# Patient Record
Sex: Male | Born: 1955
Health system: Southern US, Community
[De-identification: ages and names within clinical notes are randomized; demographics above are authoritative.]

## PROBLEM LIST (undated history)

## (undated) DIAGNOSIS — H409 Unspecified glaucoma: Secondary | ICD-10-CM

## (undated) DIAGNOSIS — I639 Cerebral infarction, unspecified: Secondary | ICD-10-CM

## (undated) DIAGNOSIS — J449 Chronic obstructive pulmonary disease, unspecified: Secondary | ICD-10-CM

## (undated) DIAGNOSIS — E7800 Pure hypercholesterolemia, unspecified: Secondary | ICD-10-CM

## (undated) DIAGNOSIS — Z7902 Long term (current) use of antithrombotics/antiplatelets: Secondary | ICD-10-CM

## (undated) DIAGNOSIS — K219 Gastro-esophageal reflux disease without esophagitis: Secondary | ICD-10-CM

## (undated) DIAGNOSIS — K760 Fatty (change of) liver, not elsewhere classified: Secondary | ICD-10-CM

## (undated) DIAGNOSIS — M5030 Other cervical disc degeneration, unspecified cervical region: Secondary | ICD-10-CM

## (undated) DIAGNOSIS — I700 Atherosclerosis of aorta: Secondary | ICD-10-CM

## (undated) DIAGNOSIS — Z9841 Cataract extraction status, right eye: Secondary | ICD-10-CM

## (undated) DIAGNOSIS — R42 Dizziness and giddiness: Secondary | ICD-10-CM

## (undated) DIAGNOSIS — I10 Essential (primary) hypertension: Secondary | ICD-10-CM

## (undated) DIAGNOSIS — I200 Unstable angina: Secondary | ICD-10-CM

## (undated) DIAGNOSIS — I739 Peripheral vascular disease, unspecified: Secondary | ICD-10-CM

## (undated) DIAGNOSIS — Z955 Presence of coronary angioplasty implant and graft: Secondary | ICD-10-CM

## (undated) DIAGNOSIS — I2510 Atherosclerotic heart disease of native coronary artery without angina pectoris: Secondary | ICD-10-CM

## (undated) DIAGNOSIS — M79609 Pain in unspecified limb: Secondary | ICD-10-CM

## (undated) DIAGNOSIS — K4090 Unilateral inguinal hernia, without obstruction or gangrene, not specified as recurrent: Secondary | ICD-10-CM

## (undated) DIAGNOSIS — M109 Gout, unspecified: Secondary | ICD-10-CM

## (undated) DIAGNOSIS — S82892A Other fracture of left lower leg, initial encounter for closed fracture: Secondary | ICD-10-CM

## (undated) HISTORY — DX: Other fracture of left lower leg, initial encounter for closed fracture: S82.892A

## (undated) HISTORY — DX: Dizziness and giddiness: R42

## (undated) HISTORY — DX: Pain in unspecified limb: M79.609

## (undated) HISTORY — DX: Unstable angina: I20.0

## (undated) HISTORY — DX: Unspecified glaucoma: H40.9

## (undated) HISTORY — PX: EYE SURGERY: SHX253

## (undated) HISTORY — PX: APPENDECTOMY: SHX54

## (undated) HISTORY — PX: CORONARY ANGIOPLASTY WITH STENT PLACEMENT: SHX49

## (undated) HISTORY — PX: CARDIAC CATHETERIZATION: SHX172

---

## 2008-07-19 ENCOUNTER — Inpatient Hospital Stay: Payer: Self-pay | Admitting: Surgery

## 2009-05-13 ENCOUNTER — Emergency Department: Payer: Self-pay | Admitting: Internal Medicine

## 2010-01-21 ENCOUNTER — Observation Stay: Payer: Self-pay | Admitting: Internal Medicine

## 2010-06-18 ENCOUNTER — Inpatient Hospital Stay: Payer: Self-pay | Admitting: Internal Medicine

## 2010-06-19 DIAGNOSIS — I2510 Atherosclerotic heart disease of native coronary artery without angina pectoris: Secondary | ICD-10-CM

## 2010-06-19 HISTORY — PX: LEFT HEART CATH AND CORONARY ANGIOGRAPHY: CATH118249

## 2010-06-19 HISTORY — DX: Atherosclerotic heart disease of native coronary artery without angina pectoris: I25.10

## 2010-07-09 HISTORY — PX: OTHER SURGICAL HISTORY: SHX169

## 2011-12-03 ENCOUNTER — Emergency Department: Payer: Self-pay | Admitting: *Deleted

## 2011-12-03 LAB — COMPREHENSIVE METABOLIC PANEL
Alkaline Phosphatase: 90 U/L (ref 50–136)
Anion Gap: 12 (ref 7–16)
Co2: 23 mmol/L (ref 21–32)
Creatinine: 0.79 mg/dL (ref 0.60–1.30)
EGFR (African American): >60
Glucose: 99 mg/dL (ref 65–99)
Osmolality: 274 (ref 275–301)
Potassium: 3.4 mmol/L — ABNORMAL LOW (ref 3.5–5.1)
SGPT (ALT): 22 U/L
Total Protein: 7.9 g/dL (ref 6.4–8.2)

## 2011-12-03 LAB — CBC
HCT: 50 % (ref 40.0–52.0)
HGB: 17.1 g/dL (ref 13.0–18.0)
MCV: 98 fL (ref 80–100)
RDW: 14.4 % (ref 11.5–14.5)
WBC: 7.2 10*3/uL (ref 3.8–10.6)

## 2011-12-03 LAB — TROPONIN I: Troponin-I: <0.02 ng/mL

## 2012-11-06 ENCOUNTER — Observation Stay: Payer: Self-pay | Admitting: Internal Medicine

## 2012-11-06 DIAGNOSIS — J019 Acute sinusitis, unspecified: Secondary | ICD-10-CM | POA: Diagnosis not present

## 2012-11-06 DIAGNOSIS — I10 Essential (primary) hypertension: Secondary | ICD-10-CM | POA: Diagnosis not present

## 2012-11-06 DIAGNOSIS — Z79899 Other long term (current) drug therapy: Secondary | ICD-10-CM | POA: Diagnosis not present

## 2012-11-06 DIAGNOSIS — E785 Hyperlipidemia, unspecified: Secondary | ICD-10-CM | POA: Diagnosis not present

## 2012-11-06 DIAGNOSIS — R0789 Other chest pain: Secondary | ICD-10-CM | POA: Diagnosis not present

## 2012-11-06 DIAGNOSIS — Z7982 Long term (current) use of aspirin: Secondary | ICD-10-CM | POA: Diagnosis not present

## 2012-11-06 DIAGNOSIS — Z7902 Long term (current) use of antithrombotics/antiplatelets: Secondary | ICD-10-CM | POA: Diagnosis not present

## 2012-11-06 DIAGNOSIS — R079 Chest pain, unspecified: Secondary | ICD-10-CM | POA: Diagnosis not present

## 2012-11-06 DIAGNOSIS — E7800 Pure hypercholesterolemia, unspecified: Secondary | ICD-10-CM | POA: Diagnosis not present

## 2012-11-06 DIAGNOSIS — R42 Dizziness and giddiness: Secondary | ICD-10-CM | POA: Diagnosis not present

## 2012-11-06 DIAGNOSIS — Z602 Problems related to living alone: Secondary | ICD-10-CM | POA: Diagnosis not present

## 2012-11-06 DIAGNOSIS — F17200 Nicotine dependence, unspecified, uncomplicated: Secondary | ICD-10-CM | POA: Diagnosis not present

## 2012-11-06 DIAGNOSIS — Z9089 Acquired absence of other organs: Secondary | ICD-10-CM | POA: Diagnosis not present

## 2012-11-06 DIAGNOSIS — Z9861 Coronary angioplasty status: Secondary | ICD-10-CM | POA: Diagnosis not present

## 2012-11-06 DIAGNOSIS — I2510 Atherosclerotic heart disease of native coronary artery without angina pectoris: Secondary | ICD-10-CM | POA: Diagnosis not present

## 2012-11-06 LAB — BASIC METABOLIC PANEL
Anion Gap: 7 (ref 7–16)
Co2: 25 mmol/L (ref 21–32)
Creatinine: 0.93 mg/dL (ref 0.60–1.30)
EGFR (African American): >60
EGFR (Non-African Amer.): >60
Glucose: 88 mg/dL (ref 65–99)
Potassium: 3.7 mmol/L (ref 3.5–5.1)
Sodium: 134 mmol/L — ABNORMAL LOW (ref 136–145)

## 2012-11-06 LAB — CBC
MCH: 32.9 pg (ref 26.0–34.0)
MCHC: 34.3 g/dL (ref 32.0–36.0)
MCV: 96 fL (ref 80–100)
Platelet: 213 10*3/uL (ref 150–440)
RDW: 13.6 % (ref 11.5–14.5)
WBC: 7.7 10*3/uL (ref 3.8–10.6)

## 2012-11-06 LAB — URINALYSIS, COMPLETE
Bilirubin,UR: NEGATIVE
Glucose,UR: NEGATIVE mg/dL (ref 0–75)
Leukocyte Esterase: NEGATIVE
Nitrite: NEGATIVE
Ph: 5 (ref 4.5–8.0)
Protein: NEGATIVE
RBC,UR: 1 /HPF (ref 0–5)

## 2012-11-06 LAB — ETHANOL
Ethanol %: 0.074 % (ref 0.000–0.080)
Ethanol: 74 mg/dL

## 2012-11-06 LAB — CK TOTAL AND CKMB (NOT AT ARMC)
CK, Total: 63 U/L (ref 35–232)
CK, Total: 62 U/L (ref 35–232)
CK-MB: <0.5 ng/mL — ABNORMAL LOW (ref 0.5–3.6)

## 2012-11-06 LAB — PROTIME-INR: INR: 1

## 2012-11-06 LAB — TROPONIN I: Troponin-I: <0.02 ng/mL

## 2012-11-07 DIAGNOSIS — I2510 Atherosclerotic heart disease of native coronary artery without angina pectoris: Secondary | ICD-10-CM | POA: Diagnosis not present

## 2012-11-07 DIAGNOSIS — E785 Hyperlipidemia, unspecified: Secondary | ICD-10-CM | POA: Diagnosis not present

## 2012-11-07 DIAGNOSIS — R079 Chest pain, unspecified: Secondary | ICD-10-CM | POA: Diagnosis not present

## 2012-11-07 DIAGNOSIS — F17200 Nicotine dependence, unspecified, uncomplicated: Secondary | ICD-10-CM | POA: Diagnosis not present

## 2012-11-07 DIAGNOSIS — I10 Essential (primary) hypertension: Secondary | ICD-10-CM | POA: Diagnosis not present

## 2012-11-07 LAB — BASIC METABOLIC PANEL
Anion Gap: 4 — ABNORMAL LOW (ref 7–16)
BUN: 14 mg/dL (ref 7–18)
Chloride: 107 mmol/L (ref 98–107)
Creatinine: 0.83 mg/dL (ref 0.60–1.30)
EGFR (African American): >60
Glucose: 90 mg/dL (ref 65–99)
Potassium: 4.1 mmol/L (ref 3.5–5.1)
Sodium: 136 mmol/L (ref 136–145)

## 2012-11-07 LAB — CBC WITH DIFFERENTIAL/PLATELET
HCT: 47.7 % (ref 40.0–52.0)
Lymphocyte %: 25.3 %
MCH: 32.9 pg (ref 26.0–34.0)
MCV: 95 fL (ref 80–100)
Monocyte %: 8.9 %
Neutrophil %: 63 %
RBC: 5.02 10*6/uL (ref 4.40–5.90)
RDW: 13.8 % (ref 11.5–14.5)
WBC: 6.9 10*3/uL (ref 3.8–10.6)

## 2012-11-07 LAB — LIPID PANEL
Cholesterol: 178 mg/dL (ref 0–200)
Ldl Cholesterol, Calc: 136 mg/dL — ABNORMAL HIGH (ref 0–100)
Triglycerides: 103 mg/dL (ref 0–200)

## 2012-11-07 LAB — CK TOTAL AND CKMB (NOT AT ARMC): CK, Total: 55 U/L (ref 35–232)

## 2012-11-14 DIAGNOSIS — I10 Essential (primary) hypertension: Secondary | ICD-10-CM | POA: Diagnosis not present

## 2012-11-14 DIAGNOSIS — E785 Hyperlipidemia, unspecified: Secondary | ICD-10-CM | POA: Diagnosis not present

## 2012-11-14 DIAGNOSIS — I2510 Atherosclerotic heart disease of native coronary artery without angina pectoris: Secondary | ICD-10-CM | POA: Diagnosis not present

## 2012-11-14 DIAGNOSIS — Z23 Encounter for immunization: Secondary | ICD-10-CM | POA: Diagnosis not present

## 2012-11-14 DIAGNOSIS — L259 Unspecified contact dermatitis, unspecified cause: Secondary | ICD-10-CM | POA: Diagnosis not present

## 2012-11-21 DIAGNOSIS — I2510 Atherosclerotic heart disease of native coronary artery without angina pectoris: Secondary | ICD-10-CM | POA: Diagnosis not present

## 2012-11-21 DIAGNOSIS — R0789 Other chest pain: Secondary | ICD-10-CM | POA: Diagnosis not present

## 2012-11-21 DIAGNOSIS — R0989 Other specified symptoms and signs involving the circulatory and respiratory systems: Secondary | ICD-10-CM | POA: Diagnosis not present

## 2012-11-21 DIAGNOSIS — E782 Mixed hyperlipidemia: Secondary | ICD-10-CM | POA: Diagnosis not present

## 2012-12-17 DIAGNOSIS — Z0000 Encounter for general adult medical examination without abnormal findings: Secondary | ICD-10-CM | POA: Diagnosis not present

## 2012-12-17 DIAGNOSIS — I10 Essential (primary) hypertension: Secondary | ICD-10-CM | POA: Diagnosis not present

## 2012-12-17 DIAGNOSIS — Z125 Encounter for screening for malignant neoplasm of prostate: Secondary | ICD-10-CM | POA: Diagnosis not present

## 2012-12-17 DIAGNOSIS — M109 Gout, unspecified: Secondary | ICD-10-CM | POA: Diagnosis not present

## 2012-12-17 DIAGNOSIS — Z79899 Other long term (current) drug therapy: Secondary | ICD-10-CM | POA: Diagnosis not present

## 2012-12-17 DIAGNOSIS — E785 Hyperlipidemia, unspecified: Secondary | ICD-10-CM | POA: Diagnosis not present

## 2012-12-18 DIAGNOSIS — L708 Other acne: Secondary | ICD-10-CM | POA: Diagnosis not present

## 2012-12-18 DIAGNOSIS — L570 Actinic keratosis: Secondary | ICD-10-CM | POA: Diagnosis not present

## 2012-12-18 DIAGNOSIS — R21 Rash and other nonspecific skin eruption: Secondary | ICD-10-CM | POA: Diagnosis not present

## 2012-12-18 DIAGNOSIS — B079 Viral wart, unspecified: Secondary | ICD-10-CM | POA: Diagnosis not present

## 2012-12-18 DIAGNOSIS — L821 Other seborrheic keratosis: Secondary | ICD-10-CM | POA: Diagnosis not present

## 2013-01-08 DIAGNOSIS — L259 Unspecified contact dermatitis, unspecified cause: Secondary | ICD-10-CM | POA: Diagnosis not present

## 2013-01-08 DIAGNOSIS — L570 Actinic keratosis: Secondary | ICD-10-CM | POA: Diagnosis not present

## 2013-01-08 DIAGNOSIS — R21 Rash and other nonspecific skin eruption: Secondary | ICD-10-CM | POA: Diagnosis not present

## 2013-01-21 DIAGNOSIS — Z800 Family history of malignant neoplasm of digestive organs: Secondary | ICD-10-CM | POA: Diagnosis not present

## 2013-01-21 DIAGNOSIS — Z1211 Encounter for screening for malignant neoplasm of colon: Secondary | ICD-10-CM | POA: Diagnosis not present

## 2013-01-21 DIAGNOSIS — I2510 Atherosclerotic heart disease of native coronary artery without angina pectoris: Secondary | ICD-10-CM | POA: Diagnosis not present

## 2013-01-30 DIAGNOSIS — L259 Unspecified contact dermatitis, unspecified cause: Secondary | ICD-10-CM | POA: Diagnosis not present

## 2013-02-25 DIAGNOSIS — E782 Mixed hyperlipidemia: Secondary | ICD-10-CM | POA: Diagnosis not present

## 2013-02-25 DIAGNOSIS — I10 Essential (primary) hypertension: Secondary | ICD-10-CM | POA: Diagnosis not present

## 2013-02-25 DIAGNOSIS — I2510 Atherosclerotic heart disease of native coronary artery without angina pectoris: Secondary | ICD-10-CM | POA: Diagnosis not present

## 2013-03-09 LAB — HM COLONOSCOPY

## 2013-03-30 ENCOUNTER — Ambulatory Visit: Payer: Self-pay | Admitting: Gastroenterology

## 2013-03-30 DIAGNOSIS — I2510 Atherosclerotic heart disease of native coronary artery without angina pectoris: Secondary | ICD-10-CM | POA: Diagnosis not present

## 2013-03-30 DIAGNOSIS — K5730 Diverticulosis of large intestine without perforation or abscess without bleeding: Secondary | ICD-10-CM | POA: Diagnosis not present

## 2013-03-30 DIAGNOSIS — Z7982 Long term (current) use of aspirin: Secondary | ICD-10-CM | POA: Diagnosis not present

## 2013-03-30 DIAGNOSIS — D128 Benign neoplasm of rectum: Secondary | ICD-10-CM | POA: Diagnosis not present

## 2013-03-30 DIAGNOSIS — Z79899 Other long term (current) drug therapy: Secondary | ICD-10-CM | POA: Diagnosis not present

## 2013-03-30 DIAGNOSIS — D649 Anemia, unspecified: Secondary | ICD-10-CM | POA: Diagnosis not present

## 2013-03-30 DIAGNOSIS — D126 Benign neoplasm of colon, unspecified: Secondary | ICD-10-CM | POA: Diagnosis not present

## 2013-03-30 DIAGNOSIS — F17200 Nicotine dependence, unspecified, uncomplicated: Secondary | ICD-10-CM | POA: Diagnosis not present

## 2013-03-30 DIAGNOSIS — Z1211 Encounter for screening for malignant neoplasm of colon: Secondary | ICD-10-CM | POA: Diagnosis not present

## 2013-03-30 DIAGNOSIS — Z9861 Coronary angioplasty status: Secondary | ICD-10-CM | POA: Diagnosis not present

## 2013-03-30 DIAGNOSIS — Z800 Family history of malignant neoplasm of digestive organs: Secondary | ICD-10-CM | POA: Diagnosis not present

## 2013-03-30 HISTORY — PX: COLONOSCOPY: SHX5424

## 2013-04-20 DIAGNOSIS — M62838 Other muscle spasm: Secondary | ICD-10-CM | POA: Diagnosis not present

## 2013-04-20 DIAGNOSIS — L259 Unspecified contact dermatitis, unspecified cause: Secondary | ICD-10-CM | POA: Diagnosis not present

## 2013-04-20 DIAGNOSIS — Z23 Encounter for immunization: Secondary | ICD-10-CM | POA: Diagnosis not present

## 2013-07-23 DIAGNOSIS — J019 Acute sinusitis, unspecified: Secondary | ICD-10-CM | POA: Diagnosis not present

## 2013-08-11 DIAGNOSIS — R0609 Other forms of dyspnea: Secondary | ICD-10-CM | POA: Diagnosis not present

## 2013-08-11 DIAGNOSIS — R0789 Other chest pain: Secondary | ICD-10-CM | POA: Diagnosis not present

## 2013-08-11 DIAGNOSIS — I10 Essential (primary) hypertension: Secondary | ICD-10-CM | POA: Diagnosis not present

## 2013-08-11 DIAGNOSIS — R0989 Other specified symptoms and signs involving the circulatory and respiratory systems: Secondary | ICD-10-CM | POA: Diagnosis not present

## 2013-08-11 DIAGNOSIS — I2510 Atherosclerotic heart disease of native coronary artery without angina pectoris: Secondary | ICD-10-CM | POA: Diagnosis not present

## 2013-08-21 ENCOUNTER — Ambulatory Visit: Payer: Self-pay | Admitting: Internal Medicine

## 2013-08-21 DIAGNOSIS — R05 Cough: Secondary | ICD-10-CM | POA: Diagnosis not present

## 2013-08-21 DIAGNOSIS — F17200 Nicotine dependence, unspecified, uncomplicated: Secondary | ICD-10-CM | POA: Diagnosis not present

## 2013-08-21 DIAGNOSIS — R059 Cough, unspecified: Secondary | ICD-10-CM | POA: Diagnosis not present

## 2013-08-21 DIAGNOSIS — J40 Bronchitis, not specified as acute or chronic: Secondary | ICD-10-CM | POA: Diagnosis not present

## 2013-09-04 DIAGNOSIS — R0609 Other forms of dyspnea: Secondary | ICD-10-CM | POA: Diagnosis not present

## 2013-09-04 DIAGNOSIS — I70219 Atherosclerosis of native arteries of extremities with intermittent claudication, unspecified extremity: Secondary | ICD-10-CM | POA: Diagnosis not present

## 2013-09-04 DIAGNOSIS — I2510 Atherosclerotic heart disease of native coronary artery without angina pectoris: Secondary | ICD-10-CM | POA: Diagnosis not present

## 2013-09-04 DIAGNOSIS — R0789 Other chest pain: Secondary | ICD-10-CM | POA: Diagnosis not present

## 2013-09-13 ENCOUNTER — Ambulatory Visit: Payer: Self-pay | Admitting: Physician Assistant

## 2013-09-13 DIAGNOSIS — S058X9A Other injuries of unspecified eye and orbit, initial encounter: Secondary | ICD-10-CM | POA: Diagnosis not present

## 2013-09-13 DIAGNOSIS — T1500XA Foreign body in cornea, unspecified eye, initial encounter: Secondary | ICD-10-CM | POA: Diagnosis not present

## 2013-10-27 DIAGNOSIS — I70219 Atherosclerosis of native arteries of extremities with intermittent claudication, unspecified extremity: Secondary | ICD-10-CM | POA: Diagnosis not present

## 2013-10-27 DIAGNOSIS — F17200 Nicotine dependence, unspecified, uncomplicated: Secondary | ICD-10-CM | POA: Diagnosis not present

## 2013-10-27 DIAGNOSIS — I2510 Atherosclerotic heart disease of native coronary artery without angina pectoris: Secondary | ICD-10-CM | POA: Diagnosis not present

## 2013-10-27 DIAGNOSIS — I10 Essential (primary) hypertension: Secondary | ICD-10-CM | POA: Diagnosis not present

## 2013-11-09 DIAGNOSIS — R0989 Other specified symptoms and signs involving the circulatory and respiratory systems: Secondary | ICD-10-CM | POA: Diagnosis not present

## 2013-11-09 DIAGNOSIS — I10 Essential (primary) hypertension: Secondary | ICD-10-CM | POA: Diagnosis not present

## 2013-11-09 DIAGNOSIS — E782 Mixed hyperlipidemia: Secondary | ICD-10-CM | POA: Diagnosis not present

## 2013-11-09 DIAGNOSIS — R0609 Other forms of dyspnea: Secondary | ICD-10-CM | POA: Diagnosis not present

## 2013-11-09 DIAGNOSIS — I2510 Atherosclerotic heart disease of native coronary artery without angina pectoris: Secondary | ICD-10-CM | POA: Diagnosis not present

## 2013-12-18 DIAGNOSIS — I2510 Atherosclerotic heart disease of native coronary artery without angina pectoris: Secondary | ICD-10-CM | POA: Diagnosis not present

## 2013-12-18 DIAGNOSIS — Z0000 Encounter for general adult medical examination without abnormal findings: Secondary | ICD-10-CM | POA: Diagnosis not present

## 2013-12-18 DIAGNOSIS — I10 Essential (primary) hypertension: Secondary | ICD-10-CM | POA: Diagnosis not present

## 2013-12-18 DIAGNOSIS — Z125 Encounter for screening for malignant neoplasm of prostate: Secondary | ICD-10-CM | POA: Diagnosis not present

## 2013-12-18 DIAGNOSIS — F17200 Nicotine dependence, unspecified, uncomplicated: Secondary | ICD-10-CM | POA: Diagnosis not present

## 2013-12-18 DIAGNOSIS — I70219 Atherosclerosis of native arteries of extremities with intermittent claudication, unspecified extremity: Secondary | ICD-10-CM | POA: Diagnosis not present

## 2013-12-18 DIAGNOSIS — M109 Gout, unspecified: Secondary | ICD-10-CM | POA: Diagnosis not present

## 2013-12-18 DIAGNOSIS — Z23 Encounter for immunization: Secondary | ICD-10-CM | POA: Diagnosis not present

## 2013-12-18 DIAGNOSIS — E785 Hyperlipidemia, unspecified: Secondary | ICD-10-CM | POA: Diagnosis not present

## 2014-01-27 DIAGNOSIS — Z9889 Other specified postprocedural states: Secondary | ICD-10-CM | POA: Insufficient documentation

## 2014-01-27 DIAGNOSIS — I739 Peripheral vascular disease, unspecified: Secondary | ICD-10-CM | POA: Insufficient documentation

## 2014-04-08 DIAGNOSIS — Z23 Encounter for immunization: Secondary | ICD-10-CM | POA: Diagnosis not present

## 2014-04-13 DIAGNOSIS — M109 Gout, unspecified: Secondary | ICD-10-CM | POA: Diagnosis not present

## 2014-05-12 DIAGNOSIS — E780 Pure hypercholesterolemia: Secondary | ICD-10-CM | POA: Diagnosis not present

## 2014-05-12 DIAGNOSIS — M25511 Pain in right shoulder: Secondary | ICD-10-CM | POA: Diagnosis not present

## 2014-05-12 DIAGNOSIS — Z9889 Other specified postprocedural states: Secondary | ICD-10-CM | POA: Diagnosis not present

## 2014-05-12 DIAGNOSIS — M720 Palmar fascial fibromatosis [Dupuytren]: Secondary | ICD-10-CM | POA: Diagnosis not present

## 2014-05-12 DIAGNOSIS — J40 Bronchitis, not specified as acute or chronic: Secondary | ICD-10-CM | POA: Diagnosis not present

## 2014-05-12 DIAGNOSIS — I10 Essential (primary) hypertension: Secondary | ICD-10-CM | POA: Diagnosis not present

## 2014-05-18 DIAGNOSIS — M25511 Pain in right shoulder: Secondary | ICD-10-CM | POA: Diagnosis not present

## 2014-05-18 DIAGNOSIS — M7551 Bursitis of right shoulder: Secondary | ICD-10-CM | POA: Diagnosis not present

## 2014-07-02 ENCOUNTER — Observation Stay: Payer: Self-pay | Admitting: Internal Medicine

## 2014-07-02 DIAGNOSIS — Z79899 Other long term (current) drug therapy: Secondary | ICD-10-CM | POA: Diagnosis not present

## 2014-07-02 DIAGNOSIS — I252 Old myocardial infarction: Secondary | ICD-10-CM | POA: Diagnosis not present

## 2014-07-02 DIAGNOSIS — M549 Dorsalgia, unspecified: Secondary | ICD-10-CM | POA: Diagnosis not present

## 2014-07-02 DIAGNOSIS — Z955 Presence of coronary angioplasty implant and graft: Secondary | ICD-10-CM | POA: Diagnosis not present

## 2014-07-02 DIAGNOSIS — I200 Unstable angina: Secondary | ICD-10-CM | POA: Diagnosis not present

## 2014-07-02 DIAGNOSIS — I10 Essential (primary) hypertension: Secondary | ICD-10-CM | POA: Diagnosis not present

## 2014-07-02 DIAGNOSIS — E785 Hyperlipidemia, unspecified: Secondary | ICD-10-CM | POA: Diagnosis not present

## 2014-07-02 DIAGNOSIS — M25519 Pain in unspecified shoulder: Secondary | ICD-10-CM | POA: Diagnosis not present

## 2014-07-02 DIAGNOSIS — Z23 Encounter for immunization: Secondary | ICD-10-CM | POA: Diagnosis not present

## 2014-07-02 DIAGNOSIS — M542 Cervicalgia: Secondary | ICD-10-CM | POA: Diagnosis not present

## 2014-07-02 DIAGNOSIS — E782 Mixed hyperlipidemia: Secondary | ICD-10-CM | POA: Diagnosis not present

## 2014-07-02 DIAGNOSIS — Z7901 Long term (current) use of anticoagulants: Secondary | ICD-10-CM | POA: Diagnosis not present

## 2014-07-02 DIAGNOSIS — I25110 Atherosclerotic heart disease of native coronary artery with unstable angina pectoris: Secondary | ICD-10-CM | POA: Diagnosis not present

## 2014-07-02 DIAGNOSIS — I25119 Atherosclerotic heart disease of native coronary artery with unspecified angina pectoris: Secondary | ICD-10-CM | POA: Diagnosis not present

## 2014-07-02 DIAGNOSIS — Z8249 Family history of ischemic heart disease and other diseases of the circulatory system: Secondary | ICD-10-CM | POA: Diagnosis not present

## 2014-07-02 DIAGNOSIS — F17210 Nicotine dependence, cigarettes, uncomplicated: Secondary | ICD-10-CM | POA: Diagnosis not present

## 2014-07-02 DIAGNOSIS — R079 Chest pain, unspecified: Secondary | ICD-10-CM | POA: Diagnosis not present

## 2014-07-02 DIAGNOSIS — Z7982 Long term (current) use of aspirin: Secondary | ICD-10-CM | POA: Diagnosis not present

## 2014-07-02 DIAGNOSIS — J9811 Atelectasis: Secondary | ICD-10-CM | POA: Diagnosis not present

## 2014-07-02 LAB — CBC
HCT: 47.6 % (ref 40.0–52.0)
HGB: 16 g/dL (ref 13.0–18.0)
MCH: 32.9 pg (ref 26.0–34.0)
MCHC: 33.7 g/dL (ref 32.0–36.0)
MCV: 98 fL (ref 80–100)
PLATELETS: 187 10*3/uL (ref 150–440)
RBC: 4.87 10*6/uL (ref 4.40–5.90)
RDW: 13.8 % (ref 11.5–14.5)
WBC: 8.7 10*3/uL (ref 3.8–10.6)

## 2014-07-02 LAB — BASIC METABOLIC PANEL
ANION GAP: 8 (ref 7–16)
BUN: 15 mg/dL (ref 7–18)
CALCIUM: 8.7 mg/dL (ref 8.5–10.1)
Chloride: 103 mmol/L (ref 98–107)
Co2: 26 mmol/L (ref 21–32)
Creatinine: 0.81 mg/dL (ref 0.60–1.30)
EGFR (African American): >60
GLUCOSE: 109 mg/dL — AB (ref 65–99)
OSMOLALITY: 275 (ref 275–301)
Potassium: 4 mmol/L (ref 3.5–5.1)
SODIUM: 137 mmol/L (ref 136–145)

## 2014-07-02 LAB — TROPONIN I
Troponin-I: <0.02 ng/mL
Troponin-I: <0.02 ng/mL

## 2014-07-02 LAB — CK-MB
CK-MB: <0.5 ng/mL — ABNORMAL LOW (ref 0.5–3.6)
CK-MB: <0.5 ng/mL — ABNORMAL LOW (ref 0.5–3.6)
CK-MB: <0.5 ng/mL — ABNORMAL LOW (ref 0.5–3.6)

## 2014-07-02 LAB — D-DIMER(ARMC): D-Dimer: 416 ng/ml

## 2014-07-03 DIAGNOSIS — E785 Hyperlipidemia, unspecified: Secondary | ICD-10-CM | POA: Diagnosis not present

## 2014-07-03 DIAGNOSIS — I200 Unstable angina: Secondary | ICD-10-CM | POA: Diagnosis not present

## 2014-07-03 DIAGNOSIS — I25119 Atherosclerotic heart disease of native coronary artery with unspecified angina pectoris: Secondary | ICD-10-CM | POA: Diagnosis not present

## 2014-07-03 DIAGNOSIS — I10 Essential (primary) hypertension: Secondary | ICD-10-CM | POA: Diagnosis not present

## 2014-07-03 DIAGNOSIS — R079 Chest pain, unspecified: Secondary | ICD-10-CM | POA: Diagnosis not present

## 2014-07-03 DIAGNOSIS — E782 Mixed hyperlipidemia: Secondary | ICD-10-CM | POA: Diagnosis not present

## 2014-07-06 DIAGNOSIS — I25110 Atherosclerotic heart disease of native coronary artery with unstable angina pectoris: Secondary | ICD-10-CM | POA: Diagnosis not present

## 2014-07-06 DIAGNOSIS — E780 Pure hypercholesterolemia: Secondary | ICD-10-CM | POA: Diagnosis not present

## 2014-07-06 DIAGNOSIS — Z9889 Other specified postprocedural states: Secondary | ICD-10-CM | POA: Diagnosis not present

## 2014-07-06 DIAGNOSIS — I10 Essential (primary) hypertension: Secondary | ICD-10-CM | POA: Diagnosis not present

## 2014-07-08 ENCOUNTER — Ambulatory Visit: Payer: Self-pay | Admitting: Cardiology

## 2014-07-08 DIAGNOSIS — Z800 Family history of malignant neoplasm of digestive organs: Secondary | ICD-10-CM | POA: Diagnosis not present

## 2014-07-08 DIAGNOSIS — I2510 Atherosclerotic heart disease of native coronary artery without angina pectoris: Secondary | ICD-10-CM | POA: Diagnosis not present

## 2014-07-08 DIAGNOSIS — E785 Hyperlipidemia, unspecified: Secondary | ICD-10-CM | POA: Diagnosis not present

## 2014-07-08 DIAGNOSIS — Z8639 Personal history of other endocrine, nutritional and metabolic disease: Secondary | ICD-10-CM | POA: Diagnosis not present

## 2014-07-08 DIAGNOSIS — I10 Essential (primary) hypertension: Secondary | ICD-10-CM | POA: Diagnosis not present

## 2014-07-08 DIAGNOSIS — I739 Peripheral vascular disease, unspecified: Secondary | ICD-10-CM | POA: Diagnosis not present

## 2014-07-08 DIAGNOSIS — Z7902 Long term (current) use of antithrombotics/antiplatelets: Secondary | ICD-10-CM | POA: Diagnosis not present

## 2014-07-08 DIAGNOSIS — F17210 Nicotine dependence, cigarettes, uncomplicated: Secondary | ICD-10-CM | POA: Diagnosis not present

## 2014-07-08 DIAGNOSIS — I200 Unstable angina: Secondary | ICD-10-CM | POA: Diagnosis not present

## 2014-07-08 DIAGNOSIS — I25110 Atherosclerotic heart disease of native coronary artery with unstable angina pectoris: Secondary | ICD-10-CM | POA: Diagnosis not present

## 2014-07-08 DIAGNOSIS — Z95818 Presence of other cardiac implants and grafts: Secondary | ICD-10-CM | POA: Diagnosis not present

## 2014-07-08 DIAGNOSIS — F17290 Nicotine dependence, other tobacco product, uncomplicated: Secondary | ICD-10-CM | POA: Diagnosis not present

## 2014-07-08 DIAGNOSIS — Z79899 Other long term (current) drug therapy: Secondary | ICD-10-CM | POA: Diagnosis not present

## 2014-07-08 DIAGNOSIS — Z7982 Long term (current) use of aspirin: Secondary | ICD-10-CM | POA: Diagnosis not present

## 2014-07-08 HISTORY — PX: LEFT HEART CATH AND CORONARY ANGIOGRAPHY: CATH118249

## 2014-09-19 IMAGING — CR DG CHEST 2V
1 series · 3 of 3 positions shown · non-contrast
Comparison: 11/06/2012

CLINICAL DATA: Cough, smoker

EXAM:
CHEST  2 VIEW

[Series 1: pa · 0.17mm/px · 3 of 3 slices shown]
[im 1/3]
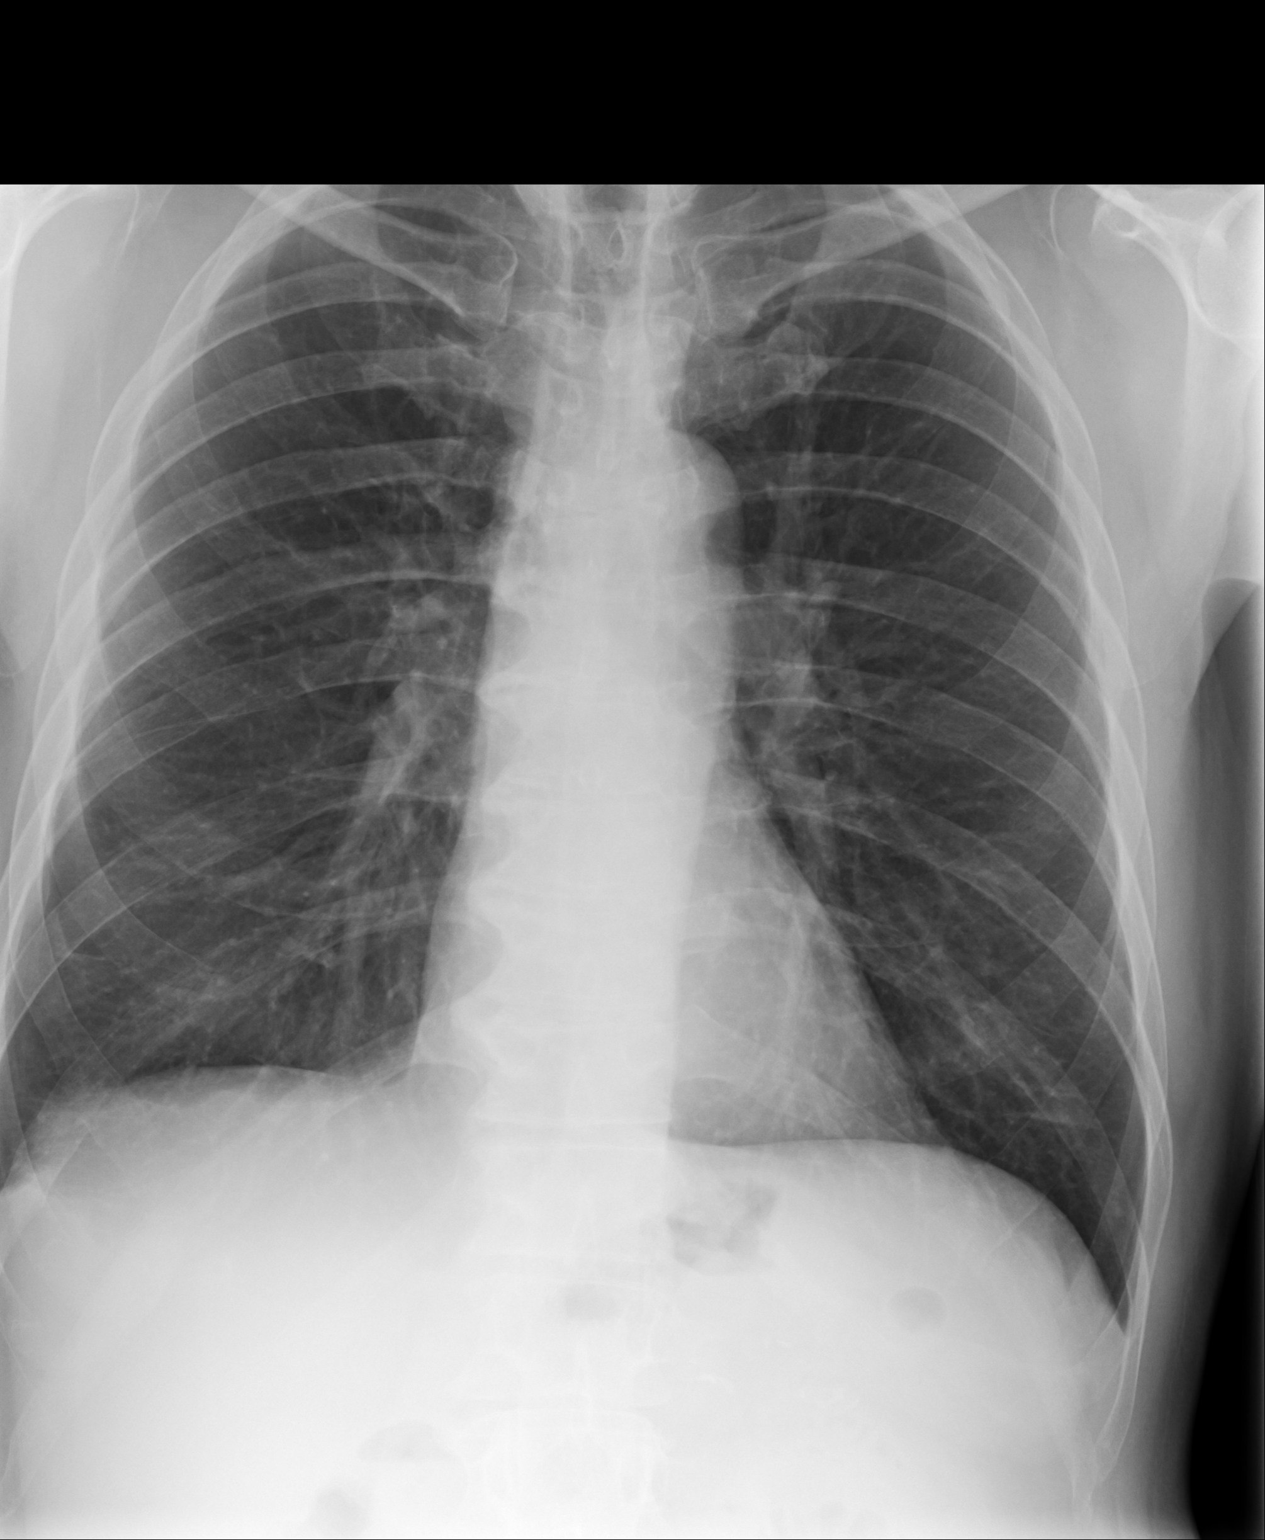
[im 2/3]
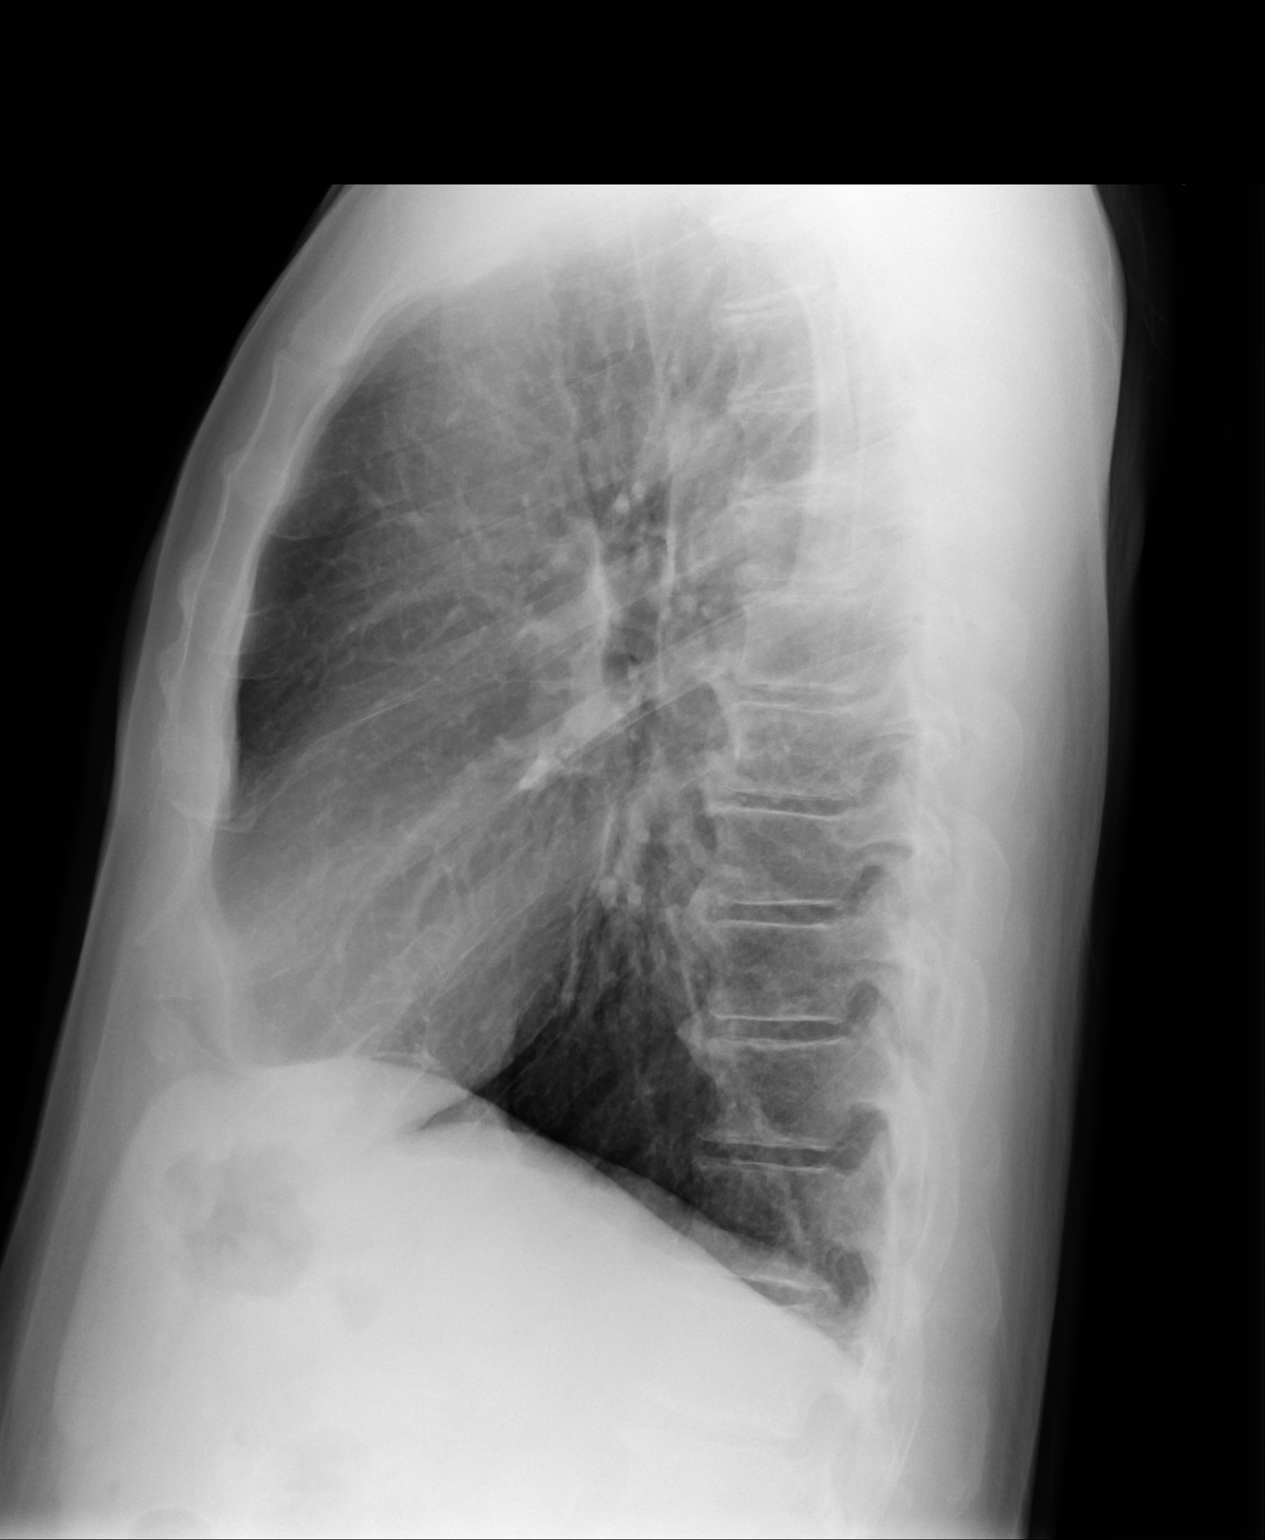
[im 3/3]
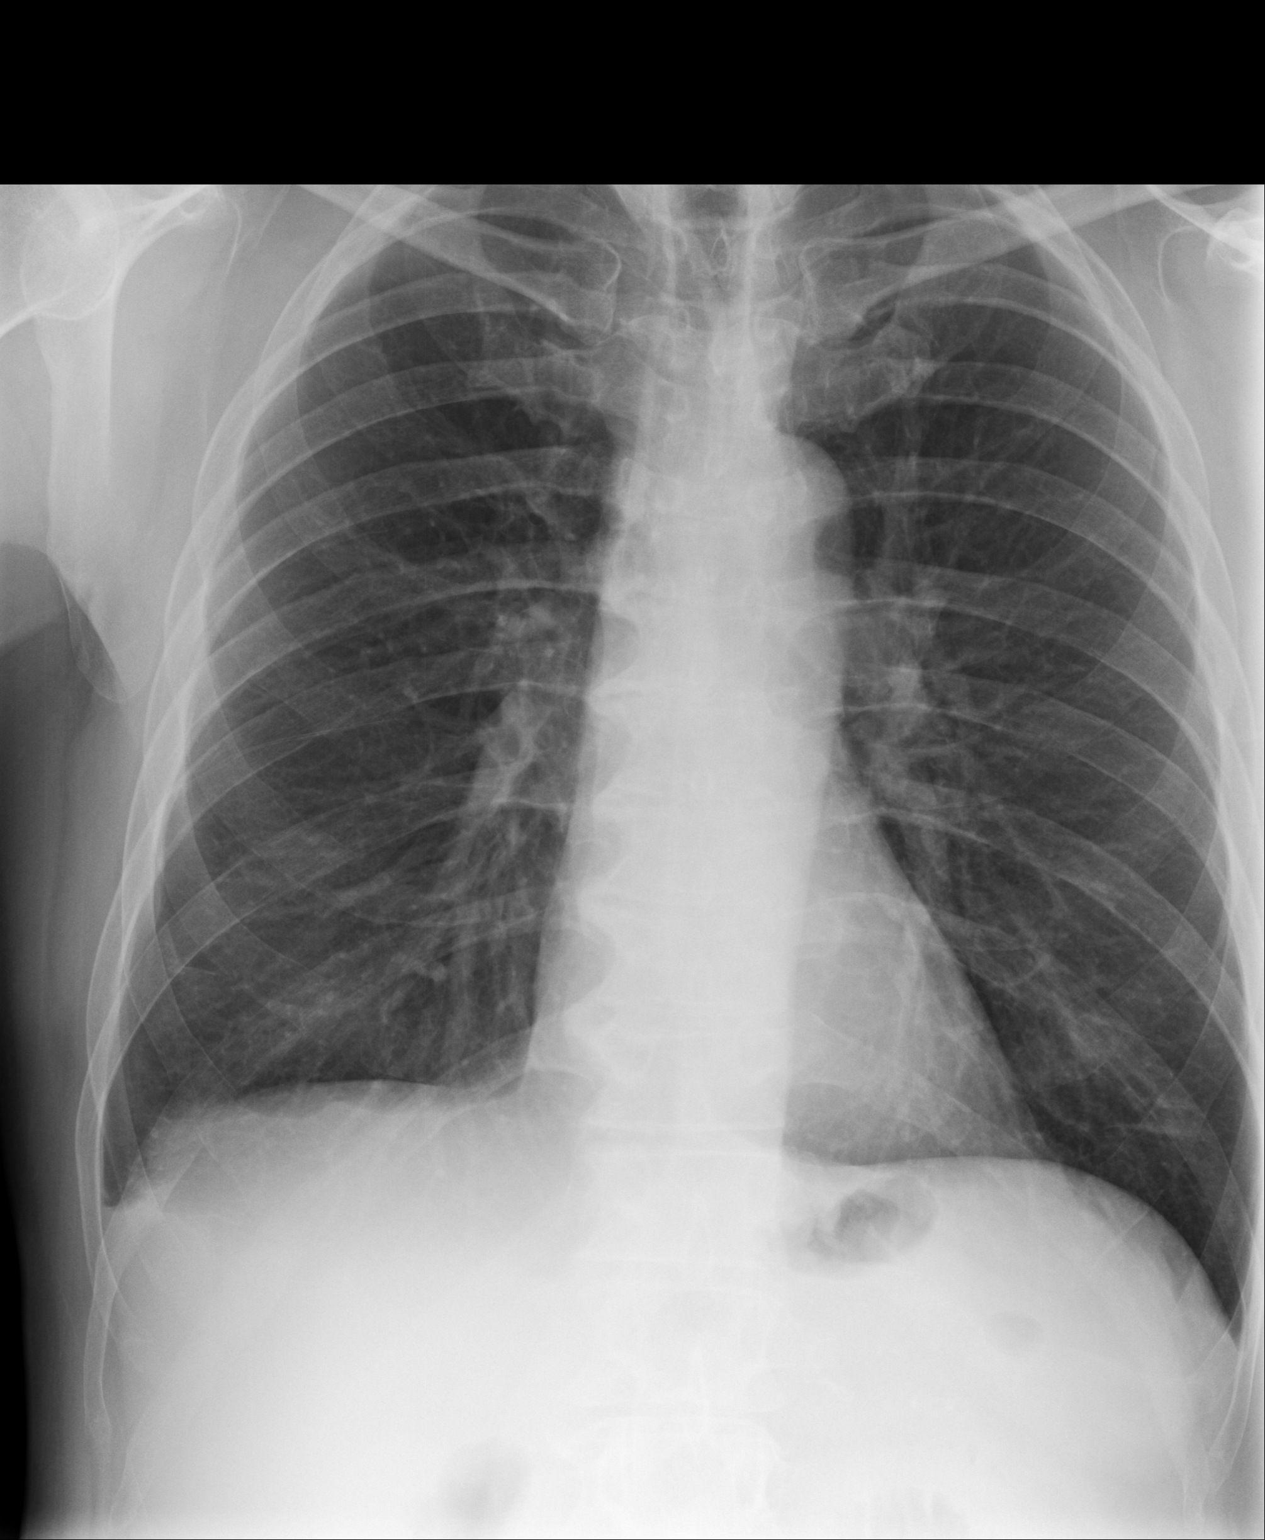

[3 of 3 positions shown; findings below may reference images not displayed]

FINDINGS: Cardiomediastinal silhouette is stable. No acute infiltrate or
pleural effusion. No pulmonary edema. Mild degenerative changes
thoracic spine.
IMPRESSION: No active cardiopulmonary disease.

## 2014-10-09 ENCOUNTER — Encounter: Payer: Self-pay | Admitting: Internal Medicine

## 2014-10-09 DIAGNOSIS — M1A9XX0 Chronic gout, unspecified, without tophus (tophi): Secondary | ICD-10-CM | POA: Insufficient documentation

## 2014-10-09 DIAGNOSIS — I25119 Atherosclerotic heart disease of native coronary artery with unspecified angina pectoris: Secondary | ICD-10-CM | POA: Insufficient documentation

## 2014-10-09 DIAGNOSIS — I10 Essential (primary) hypertension: Secondary | ICD-10-CM | POA: Insufficient documentation

## 2014-10-09 DIAGNOSIS — E7849 Other hyperlipidemia: Secondary | ICD-10-CM | POA: Insufficient documentation

## 2014-10-09 DIAGNOSIS — L409 Psoriasis, unspecified: Secondary | ICD-10-CM | POA: Insufficient documentation

## 2014-10-09 DIAGNOSIS — M25519 Pain in unspecified shoulder: Secondary | ICD-10-CM | POA: Insufficient documentation

## 2014-10-09 DIAGNOSIS — Z125 Encounter for screening for malignant neoplasm of prostate: Secondary | ICD-10-CM | POA: Insufficient documentation

## 2014-10-09 DIAGNOSIS — M720 Palmar fascial fibromatosis [Dupuytren]: Secondary | ICD-10-CM | POA: Insufficient documentation

## 2014-10-09 DIAGNOSIS — M1A00X0 Idiopathic chronic gout, unspecified site, without tophus (tophi): Secondary | ICD-10-CM | POA: Insufficient documentation

## 2014-10-09 DIAGNOSIS — I2510 Atherosclerotic heart disease of native coronary artery without angina pectoris: Secondary | ICD-10-CM | POA: Insufficient documentation

## 2014-10-29 NOTE — Consult Note (Signed)
PATIENT NAME:  Aaron [name], Aaron [name] MR#:  [id number] DATE OF BIRTH:  [birth year]  DATE OF CONSULTATION:  11/07/2012  REFERRING PHYSICIAN:  Dustin Flock, MD  CONSULTING PHYSICIAN:  Isaias Cowman, MD  CHIEF COMPLAINT:  Chest pain.   REASON FOR CONSULTATION:  A consultation requested for evaluation of chest pain.   HISTORY OF PRESENT ILLNESS:  The patient is a 59 year old gentleman with known coronary artery disease. The patient apparently has had prior coronary stents both at [facility] and at [facility]. The most recent cardiac catheterization at [facility] revealed a 60% stenosis in the mid LAD, a 50% to 60% stenosis of the ostia of the left circumflex and patent stent mid right coronary artery. The patient reportedly he was in his usual state of health until the day of admission when he experienced chest heaviness with radiation to his left arm and jaw. The patient presented to [facility] Emergency Room where an initial EKG was nondiagnostic. The patient was ruled out for myocardial infarction by CPK, isoenzymes and troponin.   PAST MEDICAL HISTORY:  1.  Coronary artery disease as described above.  2.  Hypertension.  3.  Hyperlipidemia.   MEDICATIONS:  Aspirin 325 mg daily, Imdur 90 mg daily, lisinopril 10 mg daily, metoprolol tartrate 25 mg daily, simvastatin 20 mg at bedtime, clopidogrel 75 mg daily, cilostazol 50 mg b.i.d.   SOCIAL HISTORY:  The patient is single, lives alone in [place], smokes a half-pack of cigarettes a day. He reportedly drinks two beers per day.   FAMILY HISTORY:  Positive for coronary artery disease.   REVIEW OF SYSTEMS:  CONSTITUTIONAL:  Occasional chills.  EYES:  No blurry vision.  EARS:  No hearing loss.  RESPIRATORY:  No shortness of breath.  CARDIOVASCULAR:  Chest discomfort as described above.  GASTROINTESTINAL:  No nausea, vomiting, diarrhea or constipation.  GENITOURINARY:  No dysuria or hematuria.  ENDOCRINE:  No polyuria or polydipsia.  HEMATOLOGICAL:  No easy  bruising or bleeding.  INTEGUMENTARY:  No rash.  MUSCULOSKELETAL:  No arthralgias or myalgias.  NEUROLOGICAL:  No focal muscle weakness or numbness.  PSYCHOLOGICAL:  No depression or anxiety.   PHYSICAL EXAMINATION:  VITAL SIGNS:  Blood pressure 128/75, pulse 65, respirations 20, temperature 98.3.  HEENT:  Pupils equal, reactive to light and accommodation.  NECK:  Supple without thyromegaly.  LUNGS:  Clear.  HEART:  Normal JVP. Normal PMI. Regular rate and rhythm, normal S1, S2. No appreciable gallop, murmur, or rub.  ABDOMEN:  Soft and nontender. Pulses were intact bilaterally.  MUSCULOSKELETAL:  Normal muscle tone.  NEUROLOGIC:  The patient is alert and oriented x 3. Motor and sensory are both grossly intact.   IMPRESSION:  This is a 59 year old gentleman with known coronary artery disease as described above with recurrent chest discomfort, has ruled out for myocardial infarction by CPK, isoenzymes and troponin.   RECOMMENDATIONS:  1.  Agree with overall current therapy.  2.  Agree with proceeding with Lexiscan sestamibi study.  3.  Further recommendations including possible cardiac catheterization pending Lexiscan sestamibi results.  ____________________________ Isaias Cowman, MD ap:jm D: 11/07/2012 09:52:39 ET T: 11/07/2012 10:01:10 ET JOB#: [id number]  cc: Isaias Cowman, MD, <Dictator> Isaias Cowman MD ELECTRONICALLY SIGNED 11/17/2012 13:46

## 2014-10-29 NOTE — Discharge Summary (Signed)
PATIENT NAME:  Aaron [name], Aaron [name] MR#:  [id number] DATE OF BIRTH:  1955-07-15  DATE OF ADMISSION:  11/06/2012 DATE OF DISCHARGE:  11/07/2012  PRESENTING COMPLAINT:  Chest pain.   DISCHARGE DIAGNOSES:  1.  Chronic stable angina, improved.  2.  Coronary artery disease, status post stent in the past.  3.  Hypertension.  4.  Tobacco abuse.   PROCEDURES:  Myoview stress test negative for ischemia, fixed inferior defect/scar.   DISCHARGE MEDICATIONS: 1.  Aspirin 325 mg p.o. daily.  2.  Plavix 75 mg daily.  3.  Simvastatin 20 mg at bedtime.  4.  Cilostazol 50 mg b.i.d.  5.  Imdur 90 mg daily.  6.  Nitrostat 0.4 mg sublingual as needed. 7.  Lisinopril 10 mg daily.  8.  Metoprolol 25 mg daily.   FOLLOW UP INSTRUCTIONS: 1.  Follow up with Dr. Halina Maidens, your new primary care physician in [facility] on 11/14/2012, at 10:15 a.m.  2.  Dr. Saralyn Pilar 11/21/2012, at 9:45 a.m.   LABORATORY, DIAGNOSTIC, AND RADIOLOGICAL DATA: 1.  Cardiac enzymes x 3 negative.  2.  Echo Doppler showed an EF of 60% to 65%, normal global left ventricular function.  3.  Right atrium and left atrium, normal size. No valvular abnormalities noted.   CONSULTATION:  Dr. Saralyn Pilar.   BRIEF SUMMARY OF HOSPITAL COURSE:  The patient is a 59 year old Caucasian gentleman with a past medical history of coronary artery disease, status post stenting in the past, a history of hypertension and ongoing tobacco abuse was admitted with:  1.  Chest pain/angina. The patient has history of coronary artery disease with stents in the past. He was admitted on the telemetry floor. Cardiac enzymes were negative x 3. EKG did not show any acute changes. Dr. Saralyn Pilar was the patient, recommended stress test, which was essentially negative. His aspirin, Plavix, Imdur and lisinopril along with beta blockers were continued.  2.  Hypertension. Blood pressure remained stable. BP meds were resumed at discharge.  3.  Hyperlipidemia. On  simvastatin.  4.  Nicotine addiction. The patient was counseled on smoking cessation in the hospital stay, otherwise remained stable. The patient will follow up with above MDs on the set appointment.   TIME SPENT:  40 minutes.     ____________________________ Hart Rochester Posey Pronto, MD sap:jm D: 11/08/2012 06:50:21 ET T: 11/08/2012 11:37:31 ET JOB#: [id number]  cc: Natan Hartog A. Posey Pronto, MD, <Dictator> Halina Maidens, MD Isaias Cowman, MD Muse SIGNED 11/25/2012 15:19

## 2014-10-29 NOTE — Consult Note (Signed)
Brief Consult Note: Diagnosis: CP, neg troponin.   Patient was seen by consultant.   Consult note dictated.   Comments: REC  Agree with current therapy, Lexi-sest today.  Electronic Signatures: Isaias Cowman (MD)  (Signed 02-May-14 09:53)  Authored: Brief Consult Note   Last Updated: 02-May-14 09:53 by Isaias Cowman (MD)

## 2014-10-29 NOTE — H&P (Signed)
PATIENT NAME:  Aaron [name], Aaron [name] MR#:  [id number] DATE OF BIRTH:  03-31-1956  DATE OF ADMISSION:  11/06/2012  PRIMARY CARE PROVIDER: Nonlocal.  ED REFERRING PHYSICIAN:  Dr. Conni Slipper.   CHIEF COMPLAINT: Substernal chest pressure.   HISTORY OF PRESENT ILLNESS: The patient is a 59 year old white male with history of coronary artery disease, who has had at least 3 stents according to him, who was last year hospitalized in 2011. At that time had a cardiac catheterization which showed a stent in the proximal to mid RCA. He had mild disease of the LAD midportion 60%, left circumflex ostial was 50 to 60% with a normal ejection fraction. The patient reports that since then, he has had a catheterization at [facility] but is not sure when that was, but it has not been recently. He does have chronic intermittent heaviness in his chest, which happens every few days, lasting a few minutes, but usually relieved. Earlier today he started having chest heaviness since 01:30 this a.m. which has been persistent on and off. He also associated with the chest pain had pain in his jaw. His left arm became numb. He also was lightheaded and dizzy. The patient took his nitroglycerin and his symptoms did not improve; therefore, came to the ED.  In the ER, his cardiac enzymes, his EKG all are nonrevealing.  We were asked to admit the patient. Currently he feels better. Otherwise denies any swelling in his lower extremity. Denies any nocturnal dyspnea or orthopnea.   PAST MEDICAL HISTORY:  1.  Coronary artery disease with multiple stents.  2.  Hypertension.  3.  Hyperlipidemia.  4.  Nicotine addiction.   PAST SURGICAL HISTORY:  Status post appendectomy.   CURRENT MEDICATIONS:  At home, he is on Imdur 90, aspirin 325 mg p.o. daily, cilostazol 50, 1 tab p.o. b.i.d., lisinopril 10 daily, metoprolol tartrate 25 p.o. daily, Nitrostat 0.4 sublingual p.r.n., Plavix 75 p.o. daily, simvastatin 20 mg at bedtime.   SOCIAL HISTORY: Smokes  about 10 cigarettes a day. He drinks. He reports that he had 2 beers today. Prior to that he drank like 3 months ago. He denies any drug use.   FAMILY HISTORY: Positive for coronary artery disease in the family.   REVIEW OF SYSTEMS:   CONSTITUTIONAL: Denies any fevers. Complains of fatigue, weakness complains of chest pain. No weight loss. No weight gain.  EYES: No blurred or double vision. No redness. No inflammation. No glaucoma. No cataracts.  ENT: No tinnitus. No ear pain. No hearing loss. No seasonal or year-round allergies. No difficulty swallowing.  RESPIRATORY: Denies any cough, wheezing, hemoptysis. No COPD.  CARDIOVASCULAR: Chest pressure as above. Denies any orthopnea. Denies edema. No arrhythmias. Complains of dyspnea on exertion, no palpitations, no syncope. He feels dizzy.  GASTROINTESTINAL: No nausea, vomiting, diarrhea. No abdominal pain. No hematemesis. No melena.  GENITOURINARY: Denies any dysuria, hematuria, renal calculus or frequency. ENDOCRINE:  He denies any polyuria, nocturia or thyroid problems.  HEMATOLOGIC/LYMPHATIC:  Denies any major bruisability or bleeding.  SKIN: No acne. No rash. No changes in mole, hair or skin.  MUSCULOSKELETAL: Denies any pain in the neck, back or shoulder.  NEUROLOGIC: No numbness. No weakness. No CVA. No TIA.  PSYCHIATRIC: No anxiety. No insomnia. No ADD.   PHYSICAL EXAMINATION: VITAL SIGNS: Temperature 98.5, pulse 82, respirations 24, blood pressure 122/81, O2 97% on room air.  GENERAL: The patient is a 59 year old well-developed male in no acute distress.  HEENT: Head atraumatic, normocephalic. Pupils equally round, reactive  to light and accommodation. There is no conjunctival pallor. No scleral icterus. Nasal exam shows no drainage or ulceration.  The oropharynx is clear without any exudate. EARS: no external lesion, or drainage  NECK: No thyromegaly. No carotid bruits.  CARDIOVASCULAR: Regular rate and rhythm. No murmurs, rubs, clicks  or gallops. PMI is not displaced.  LUNGS: Clear to auscultation bilaterally without any rales, rhonchi or wheezing.  ABDOMEN: Soft, nontender, nondistended. Positive bowel sounds x 4.  EXTREMITIES: No clubbing, cyanosis or edema.  SKIN: No rash.  MUSKLOSKEETAL: No erythema or warmth Full ROM intact LYMPHATICS: No lymph nodes palpable.  VASCULAR: Good DP, PT pulses.  PSYCHIATRIC: Not anxious or depressed.  NEUROLOGIC: Awake, oriented x 3. No focal deficits.  GU : deffered  LABORATORY AND DIAGNOSTIC DATA:  Glucose 88, BUN 14, creatinine 0.93, sodium 134, potassium 3.7, chloride 102, CO2 25.  His calcium level is 9.4. CPK 63, CK-MB less than 0.5, troponin less than 0.02. WBC 7.7, hemoglobin 16.9, platelet count 213, INR 1.0. EKG showed normal sinus rhythm without any ST-T wave changes. CT scan of the head showed air fluid levels in the right maxillary sinus. Chest x-ray showed no acute cardiopulmonary processes.   ASSESSMENT AND PLAN: The patient is a 59 year old white male with known coronary artery disease who presents with waxing and waning chest pressure. Concerning for possible angina. The EKG and cardiac enzymes are negative.   1.  Chest pressure.  Due to his significant history, I discussed the case with Dr. Saralyn Pilar who was on call for cardiology. He recommends doing a stress MIBI in the a.m. unless the patient's cardiac enzymes become positive. We will continue his aspirin, Plavix and Imdur as he taking at home.  2.  Dizziness. We will check orthostatic blood pressure, check an echocardiogram of the heart.  3.  Hyperlipidemia. Continue simvastatin. We will check a fasting lipid panel in the a.m.  4.  Hypertension.  Blood pressure currently is in the 100s.  I will continue his beta blocker and we will hold his other antihypertensives for the time being.  5.  Nicotine addiction. The patient was counseled regarding smoking cessation for 4 minutes. Nicotine patch offered.   Note:  45 minutes  spent.    ____________________________ Lafonda Mosses. Posey Pronto, MD shp:ct D: 11/06/2012 17:35:47 ET T: 11/06/2012 18:31:31 ET JOB#: [id number]  cc: Stephanne Greeley H. Posey Pronto, MD, <Dictator> Alric Seton MD ELECTRONICALLY SIGNED 11/15/2012 14:19

## 2014-10-30 NOTE — H&P (Signed)
PATIENT NAME:  Aaron [name], POTASH MR#:  [id number] DATE OF BIRTH:  December 25, 1955  DATE OF ADMISSION:  07/02/2014  CHIEF COMPLAINT: Chest pain since yesterday evening.   HISTORY OF PRESENT ILLNESS: Aaron [name] is a 59 year old Caucasian gentleman with past medical history of coronary artery disease who has had 4 stents in the last several years. Last cardiac cath was about 2 years ago. The patient was walking his dog yesterday evening. He started noticing some chest tightness, which  returned back this morning, was quite intense. Felt like somebody poking a knife in his chest, radiated up to his left arm. He came to the Emergency Room. He is chest pain-free; however, still has some tingling  his left upper extremity. His 2 sets of cardiac enzymes are negative. EKG does not show any acute ST or T changes. He is being admitted for unstable angina in the setting of known CAD with stenting in the past.   PAST MEDICAL HISTORY: 1. Coronary artery disease, with multiple stents.  2. Hypertension.  3. Hyperlipidemia.  4. Tobacco abuse.   PAST SURGICAL HISTORY: Status post appendectomy.   SOCIAL HISTORY: He smokes about 1/2 a pack a day. He drinks 2-3 drinks a month. He denies any drug use.   FAMILY HISTORY: Positive for CAD in family.   ALLERGIES: No known drug allergies.  HOME MEDICATIONS: 1. Simvastatin 20 mg daily.  2. Plavix 75 mg daily.  3. Nitrostat 0.4 mg sublingual as needed.  4. Metoprolol tartrate 25 mg daily.  5. Lisinopril 10 mg daily.  6. Imdur SA 90 mg once a day. 7. Cilostazol 50 mg 1 tablet b.i.d.  8. Bayer aspirin 325 mg p.o. daily.  9. Cefdinir 300 mg b.i.d.   REVIEW OF SYSTEMS: CONSTITUTIONAL: No fever, fatigue, weakness, or pain.  EYES: No blurred or double vision, glaucoma, or cataracts.  EARS, NOSE, AND THROAT: No tinnitus, ear pain, hearing loss, or postnasal drip.  RESPIRATORY: No cough, wheeze, hemoptysis, dyspnea.  CARDIOVASCULAR: Positive for chest pain and  hypertension. No dyspnea on exertion or arrhythmia.  GASTROINTESTINAL: No nausea, vomiting, diarrhea, abdominal pain, or hematemesis.  GENITOURINARY: No dysuria, hematuria, or frequency.  ENDOCRINE: No polyuria, nocturia or thyroid problems.  HEMATOLOGY: No anemia or easy bruising.  SKIN: No acne, rash, or lesion.  MUSCULOSKELETAL: Positive for arthritis.  NEUROLOGIC: No CVA, TIA, anxiety, or dementia.  PSYCHIATRIC: No anxiety or depression. All other systems reviewed and negative.   PHYSICAL EXAMINATION: GENERAL: The patient is awake, alert, oriented x3, not in acute distress.  VITAL SIGNS: Afebrile. Pulse is 67. Blood pressure is 122/65, sats 97% on room air.  HEENT: Atraumatic, normocephalic. Pupils PERRLA. EOM intact. Oral mucosa is moist.  NECK: Supple. No JVD. No carotid bruit.  RESPIRATORY: Clear to auscultation bilaterally. No rales, rhonchi, respiratory distress or labored breathing.  CARDIOVASCULAR: Both the heart sounds are normal. Rate, rhythm regular. PMI not lateralized. Chest is nontender.  EXTREMITIES: Good pedal pulses, good femoral pulses. No lower extremity edema.  ABDOMEN: Soft, benign, nontender. No organomegaly. Positive bowel sounds.  NEUROLOGIC: Grossly intact cranial nerves II through XII. No motor or sensory deficits.  PSYCHIATRIC: The patient is awake, alert, oriented x3,   LABORATORY DATA AND IMAGING STUDIES: EKG shows sinus rhythm with left atrial enlargement. Cardiac enzymes x 2 negative. Basic metabolic panel within normal limits. Chest x-ray: Minimal bibasilar atelectasis noted. CBC within normal limits.  ASSESSMENT: A 59 year old, MrMassai [name], with history of coronary artery disease, hypertension, and hyperlipidemia, comes in with:  1. Chest pain/unstable angina. Due to his significant history with CAD, we will admit the patient to the medical floor. We will continue aspirin Plavix, beta blockers, Imdur p.r.n., add nitroglycerin and give Lovenox subcutaneous  1 mg/kg b.i.d. Cardiology consultation by Dr. Nehemiah Massed. Follow recommendations per him.  2. Hyperlipidemia. Continue simvastatin.  3. Hypertension. Continue beta blockers and lisinopril.  4. Nicotine/tobacco addiction. The patient was counseled regarding smoking cessation for 4 minutes. Nicotine patch offered. He is okay with that right now.  5. Deep vein thrombosis prophylaxis, already on Lovenox.   The above was discussed with the patient. No family members present.   CODE STATUS: The patient is a full code.   TIME SPENT: 50 minutes.   ____________________________ Hart Rochester Posey Pronto, MD sap:mw D: 07/02/2014 10:50:46 ET T: 07/02/2014 11:03:27 ET JOB#: [id number]  cc: Edelmira Gallogly A. Posey Pronto, MD, <Dictator> Isaias Cowman, MD Halina Maidens, MD  Ilda Basset MD ELECTRONICALLY SIGNED 07/06/2014 18:39

## 2014-10-30 NOTE — Consult Note (Signed)
PATIENT NAME:  Aaron [name], Aaron [name] MR#:  [id number] DATE OF BIRTH:  09-Dec-1955  DATE OF CONSULTATION:  07/02/2014  REFERRING PHYSICIAN:   CONSULTING PHYSICIAN:  Corey Skains, MD  CONSULTING PHYSICIAN:  Dr. Posey Pronto.    REASON FOR CONSULTATION: Coronary artery disease, hypertension, hyperlipidemia with unstable angina.   CHIEF COMPLAINT: "I have chest pain."   HISTORY OF PRESENT ILLNESS: This is an elderly male with known coronary artery disease status post multiple stents in the past and history of myocardial infarction, who is now on appropriate medication management for mixed hyperlipidemia and essential hypertension. He has had new onset of chest discomfort substernal, radiating into his back, constant over the last 12-18 hours, not increasing and/or decreasing with nitrates and/or other treatment, it is relatively constant at this time with an EKG showing normal sinus rhythm and no evidence of myocardial infarction and normal troponin, consistent with atypical pain. This is not anything like his chest pain that he has had in the past when requiring stenting. He has been on appropriate medication management without evidence of side effects and has no virus and/or    REVIEW OF SYSTEMS:  Remainder of review of systems negative for vision change, ringing in the ears, hearing loss, cough, congestion, heartburn, nausea, vomiting, diarrhea, bloody stools, stomach pain, extremity pain, leg weakness, cramping of the buttocks, known blood clots, headaches, blackouts, dizzy spells, nosebleeds, congestion, trouble swallowing, frequent urination, urination at night, muscle weakness, numbness, anxiety, depression, skin lesions, or skin rashes.   PAST MEDICAL HISTORY:  1.  Known coronary artery disease status post stenting.  2.  Essential hypertension.  3.  Mixed hyperlipidemia.   FAMILY HISTORY: He does have family members with early onset of cardiovascular disease and hypertension.   SOCIAL HISTORY:  Currently denies alcohol or tobacco use.   ALLERGIES: AS LISTED.   MEDICATIONS: As listed.   PHYSICAL EXAMINATION:  VITAL SIGNS: Blood pressure is 110/68 bilaterally, heart rate is 72 upright, reclining, and regular.  GENERAL: He is a well-appearing male in no acute distress.  HEAD, EYES, EARS, NOSE, AND THROAT:  No icterus, thyromegaly, ulcers, hemorrhages, or xanthelasma.  CARDIOVASCULAR: Regular rate and rhythm. Normal S1 and S2 without murmur, gallop, or rub. PMI is diffuse. Carotid upstroke normal without bruit. Jugular venous pressure is normal.  LUNGS: Have few basilar crackles with normal respirations  ABDOMEN: Soft, nontender, without hepatosplenomegaly or masses. Abdominal aorta is normal size without bruit.  EXTREMITIES: Show 2 + radial, femoral, dorsal pedal pulses, with no lower extremity edema, cyanosis, clubbing, or ulcers.  NEUROLOGIC: He is oriented to time, place, and person with normal mood and affect.   ASSESSMENT: Elderly male with known coronary artery disease with progressive angina without evidence of myocardial infarction, with essential hypertension, mixed hyperlipidemia, needing further treatment.   RECOMMENDATIONS:  1.  Continue serial ECG and enzymes to assess possible myocardial infarction.  2.  Ambulation, follow for any further significant symptoms.  3.  Treatment of possible inflammatory changes and/or other chest discomfort causes.   4.  If troponins are normal and the patient has improvements will discharge home with outpatient stress test on current medical regimen including nitrates, beta blocker, aspirin, and other antiplatelets with statin.     ____________________________ Corey Skains, MD bjk:bu D: 07/02/2014 14:24:07 ET T: 07/02/2014 14:53:13 ET JOB#: [id number]  cc: Corey Skains, MD, <Dictator> Corey Skains MD ELECTRONICALLY SIGNED 07/07/2014 13:47

## 2014-11-03 NOTE — Discharge Summary (Signed)
PATIENT NAME:  MAYNARD, DAVID MR#:  [id number] DATE OF BIRTH:  1956-06-03  DATE OF ADMISSION:  07/02/2014 DATE OF DISCHARGE:  07/03/2014  PRESENTING COMPLAINT: Chest pain.   DISCHARGE DIAGNOSES:  1.  Unstable angina.  2.  Coronary artery disease status post stent in the past.  3.  Hypertension.  4.  Tobacco abuse.  5.  Hyperlipidemia.   CODE STATUS: Full code.   MEDICATIONS:  1. Lisinopril 10 mg daily.  2. Plavix 75 mg daily.  3. Simvastatin 20 mg at bedtime.  4. Cilostazol 50 mg b.i.d.   5. Bayer Aspirin 325 mg p.o. daily.  6. Imdur 90 mg orally daily.  7. Nitrostat 0.4 mg sublingual as needed.  8. Cefdinir 300 mg p.o. b.i.d. complete your prescription as prescribed.  9. Metoprolol 25 mg b.i.d.   DIET:  Low fat, low cholesterol diet.   FOLLOWUP:   1.  With Dr. Saralyn Pilar next Tuesday, December 29.  2.  Follow up with Dr. Halina Maidens, your primary care physician.   LABORATORY DATA:  Cardiac enzymes x 3 including troponin negative. Basic metabolic panel within normal limits. CBC within normal limits.   CARDIOLOGY CONSULTATION:  Dr. Nehemiah Massed.   BRIEF SUMMARY OF HOSPITAL COURSE: Emanual Lamountain is a 59 year old Caucasian gentleman with history of coronary artery disease status post stents in the past, hyperlipidemia, and ongoing tobacco abuse, comes in with:   1.  Chest pain/unstable angina with history of CAD.  His aspirin, Plavix, beta blockers were continued. He received Lovenox 1 mg per kg b.i.d.  Cardiology consultation was done with Dr. Nehemiah Massed, no further testing needed at present. The patient will follow up with his primary cardiologist, Dr. Saralyn Pilar as outpatient on December 29.  His cardiac enzymes remained negative. No acute EKG changes were noted.  2.  Hyperlipidemia. Continue simvastatin.  3.  Hypertension, on beta blockers and metoprolol.  4.  Tobacco addiction. The patient was advised on smoking cessation. Nicotine patch was offered,  however he is okay  without it right now.   The patient's hospital stay otherwise remained stable. He remained a full code.   TIME SPENT: 40 minutes.     ____________________________ Hart Rochester Posey Pronto, MD sap:bu D: 07/06/2014 17:45:41 ET T: 07/06/2014 20:09:33 ET JOB#: [id number]  cc: Muaz Shorey A. Posey Pronto, MD, <Dictator> Halina Maidens, MD Isaias Cowman, MD Corey Skains, MD Ilda Basset MD ELECTRONICALLY SIGNED 07/13/2014 11:08

## 2014-12-08 ENCOUNTER — Ambulatory Visit: Payer: Self-pay | Admitting: Internal Medicine

## 2014-12-22 ENCOUNTER — Encounter: Payer: Self-pay | Admitting: Internal Medicine

## 2014-12-22 ENCOUNTER — Ambulatory Visit (INDEPENDENT_AMBULATORY_CARE_PROVIDER_SITE_OTHER): Payer: PPO | Admitting: Internal Medicine

## 2014-12-22 ENCOUNTER — Other Ambulatory Visit: Payer: Self-pay | Admitting: Internal Medicine

## 2014-12-22 VITALS — BP 120/80 | HR 68 | Ht 73.0 in | Wt 175.2 lb

## 2014-12-22 DIAGNOSIS — M109 Gout, unspecified: Secondary | ICD-10-CM | POA: Insufficient documentation

## 2014-12-22 DIAGNOSIS — R1011 Right upper quadrant pain: Secondary | ICD-10-CM

## 2014-12-22 MED ORDER — INDOMETHACIN 25 MG PO CAPS: 25.0000 mg | ORAL_CAPSULE | Freq: Two times a day (BID) | ORAL | Status: DC | PRN

## 2014-12-22 NOTE — Progress Notes (Signed)
Date:  12/22/2014   Name:  Aaron [name]   DOB:  Mar 03, 1956   MRN:  [id number]   Chief Complaint: Bloated  Abdominal Pain This is a new problem. The current episode started 1 to 4 weeks ago. The onset quality is gradual. The problem occurs constantly. The problem has been unchanged. The pain is located in the generalized abdominal region. The patient is experiencing no pain. The quality of the pain is cramping, dull and a sensation of fullness. The abdominal pain does not radiate. Associated symptoms include diarrhea (off and on but last week every day). Pertinent negatives include no belching, fever, flatus, nausea, vomiting or weight loss. The pain is aggravated by eating. He has tried nothing for the symptoms. There is no history of gallstones, GERD, irritable bowel syndrome or pancreatitis.  He did stop drinking alcohol completely to see if there was improvement.  Review of Systems:  Review of Systems  Constitutional: Negative for fever, weight loss and unexpected weight change.  HENT: Negative for trouble swallowing and voice change.   Respiratory: Negative for shortness of breath and wheezing.   Cardiovascular: Negative for chest pain.  Gastrointestinal: Positive for abdominal pain, diarrhea (off and on but last week every day) and abdominal distention. Negative for nausea, vomiting, blood in stool and flatus.  Musculoskeletal: Negative for back pain.  Skin: Negative for color change.    Patient Active Problem List   Diagnosis Date Noted  . Gout 12/22/2014  . CAD in native artery 10/09/2014  . Chronic gouty arthropathy 10/09/2014  . Essential (primary) hypertension 10/09/2014  . Familial multiple lipoprotein-type hyperlipidemia 10/09/2014  . Dupuytren's disease of palm 10/09/2014  . Special screening for malignant neoplasm of prostate 10/09/2014  . Psoriasis 10/09/2014  . Pain in shoulder 10/09/2014  . Compulsive tobacco user syndrome 10/09/2014  . H/O cardiac catheterization  01/27/2014  . HLD (hyperlipidemia) 01/27/2014  . Peripheral blood vessel disorder 01/27/2014    Prior to Admission medications   Medication Sig Start Date End Date Taking? Authorizing Provider  allopurinol (ZYLOPRIM) 100 MG tablet Take 1 tablet by mouth daily. 04/13/14  Yes Historical Provider, MD  aspirin 325 MG EC tablet Take 1 tablet by mouth daily.   Yes Historical Provider, MD  cilostazol (PLETAL) 50 MG tablet Take 1 tablet by mouth 2 (two) times daily.    Yes Historical Provider, MD  clopidogrel (PLAVIX) 75 MG tablet Take 1 tablet by mouth daily.   Yes Historical Provider, MD  isosorbide mononitrate (IMDUR) 30 MG 24 hr tablet Take 3 tablets by mouth daily.   Yes Historical Provider, MD  lisinopril (PRINIVIL,ZESTRIL) 10 MG tablet Take 1 tablet by mouth daily.   Yes Historical Provider, MD  metoprolol tartrate (LOPRESSOR) 25 MG tablet Take 1 tablet by mouth 2 (two) times daily.   Yes Historical Provider, MD  nitroGLYCERIN (NITROSTAT) 0.4 MG SL tablet Place 1 tablet under the tongue as needed.   Yes Historical Provider, MD  simvastatin (ZOCOR) 20 MG tablet Take 1 tablet by mouth at bedtime.   Yes Historical Provider, MD  triamcinolone cream (KENALOG) 0.5 % Apply 1 application topically 2 (two) times daily. 04/26/14  Yes Historical Provider, MD  varenicline (CHANTIX) 1 MG tablet Take 1 tablet by mouth 2 (two) times daily. 08/09/14   Historical Provider, MD    No Known Allergies  Past Surgical History  Procedure Laterality Date  . Appendectomy    . Cardiac stents   2012    History  Substance Use  Topics  . Smoking status: Current Every Day Smoker  . Smokeless tobacco: Not on file     Comment: patient not ready  . Alcohol Use: 8.4 oz/week    14 Standard drinks or equivalent per week     Medication list has been reviewed and updated.  Physical Examination:  Physical Exam  Constitutional: He appears well-developed and well-nourished. No distress.  Neck: Neck supple.   Cardiovascular: Normal rate, regular rhythm and normal heart sounds.   Pulmonary/Chest: Effort normal. No respiratory distress. He has no wheezes.  Abdominal: Soft. Normal appearance and bowel sounds are normal. He exhibits distension. He exhibits no pulsatile liver, no ascites and no mass. There is no hepatosplenomegaly. There is tenderness (RUQ) in the right upper quadrant. There is no rebound, no guarding, no CVA tenderness and negative Murphy's sign. No hernia.  Musculoskeletal: He exhibits no edema or tenderness.  Skin: No rash noted. No erythema.  Psychiatric: He has a normal mood and affect. His behavior is normal.  Nursing note and vitals reviewed.   BP 120/80 mmHg  Pulse 68  Ht 6\' 1"  (1.854 m)  Wt 175 lb 3.2 oz (79.47 kg)  BMI 23.12 kg/m2  Assessment and Plan: 1. Right upper quadrant pain Suspect gall bladder disease - Amylase - Lipase - H. pylori antibody, IgG - US Abdomen Limited RUQ; Future  2. Controlled gout Refill indocin to use PRN - indomethacin (INDOCIN) 25 MG capsule; Take 1 capsule (25 mg total) by mouth 2 (two) times daily as needed.  Dispense: 30 capsule; Refill: [place], MD [facility]  12/22/2014

## 2014-12-22 NOTE — Patient Instructions (Signed)
Cholecystitis Cholecystitis is an inflammation of your gallbladder. It is usually caused by a buildup of gallstones or sludge (cholelithiasis) in your gallbladder. The gallbladder stores a fluid that helps digest fats (bile). Cholecystitis is serious and needs treatment right away.  CAUSES   Gallstones. Gallstones can block the tube that leads to your gallbladder, causing bile to build up. As bile builds up, the gallbladder becomes inflamed.  Bile duct problems, such as blockage from scarring or kinking.  Tumors. Tumors can stop bile from leaving your gallbladder correctly, causing bile to build up. As bile builds up, the gallbladder becomes inflamed. SYMPTOMS   Nausea.  Vomiting.  Abdominal pain, especially in the upper right area of your abdomen.  Abdominal tenderness or bloating.  Sweating.  Chills.  Fever.  Yellowing of the skin and the whites of the eyes (jaundice). DIAGNOSIS  Your caregiver may order blood tests to look for infection or gallbladder problems. Your caregiver may also order imaging tests, such as an ultrasound or computed tomography (CT) scan. Further tests may include a hepatobiliary iminodiacetic acid (HIDA) scan. This scan allows your caregiver to see your bile move from the liver to the gallbladder and to the small intestine. TREATMENT  A hospital stay is usually necessary to lessen the inflammation of your gallbladder. You may be required to not eat or drink (fast) for a certain amount of time. You may be given medicine to treat pain or an antibiotic medicine to treat an infection. Surgery may be needed to remove your gallbladder (cholecystectomy) once the inflammation has gone down. Surgery may be needed right away if you develop complications such as death of gallbladder tissue (gangrene) or a tear (perforation) of the gallbladder.  Downing care will depend on your treatment. In general:  If you were given antibiotics, take them as  directed. Finish them even if you start to feel better.  Only take over-the-counter or prescription medicines for pain, discomfort, or fever as directed by your caregiver.  Follow a low-fat diet until you see your caregiver again.  Keep all follow-up visits as directed by your caregiver. SEEK IMMEDIATE MEDICAL CARE IF:   Your pain is increasing and not controlled by medicines.  Your pain moves to another part of your abdomen or to your back.  You have a fever.  You have nausea and vomiting. MAKE SURE YOU:  Understand these instructions.  Will watch your condition.  Will get help right away if you are not doing well or get worse. Document Released: 06/25/2005 Document Revised: 09/17/2011 Document Reviewed: 05/11/2011 [facility] Patient Information 2015 [place], [place]. This information is not intended to replace advice given to you by your health care provider. Make sure you discuss any questions you have with your health care provider.

## 2014-12-23 LAB — LIPASE: LIPASE: 57 U/L (ref 0–59)

## 2014-12-23 LAB — AMYLASE: Amylase: 43 U/L (ref 31–124)

## 2014-12-23 LAB — H. PYLORI ANTIBODY, IGG

## 2014-12-28 ENCOUNTER — Other Ambulatory Visit: Payer: Self-pay | Admitting: Internal Medicine

## 2014-12-28 ENCOUNTER — Ambulatory Visit
Admission: RE | Admit: 2014-12-28 | Discharge: 2014-12-28 | Disposition: A | Discharge status: Home / Self Care | Payer: PPO | Source: Ambulatory Visit | Attending: Internal Medicine | Admitting: Internal Medicine

## 2014-12-28 DIAGNOSIS — R1011 Right upper quadrant pain: Secondary | ICD-10-CM | POA: Diagnosis not present

## 2014-12-28 DIAGNOSIS — M109 Gout, unspecified: Secondary | ICD-10-CM

## 2014-12-28 DIAGNOSIS — R140 Abdominal distension (gaseous): Secondary | ICD-10-CM | POA: Diagnosis not present

## 2014-12-28 MED ORDER — INDOMETHACIN 25 MG PO CAPS: 25.0000 mg | ORAL_CAPSULE | Freq: Two times a day (BID) | ORAL | Status: DC | PRN

## 2014-12-31 ENCOUNTER — Ambulatory Visit: Payer: Self-pay | Admitting: Internal Medicine

## 2015-02-28 ENCOUNTER — Encounter: Payer: Self-pay | Admitting: Internal Medicine

## 2015-03-07 ENCOUNTER — Ambulatory Visit: Payer: Self-pay | Admitting: Family Medicine

## 2015-03-10 ENCOUNTER — Ambulatory Visit (INDEPENDENT_AMBULATORY_CARE_PROVIDER_SITE_OTHER): Payer: PPO | Admitting: Internal Medicine

## 2015-03-10 ENCOUNTER — Encounter: Payer: Self-pay | Admitting: Internal Medicine

## 2015-03-10 ENCOUNTER — Ambulatory Visit: Payer: Self-pay | Admitting: Family Medicine

## 2015-03-10 VITALS — BP 110/80 | HR 80 | Temp 98.3°F | Ht 73.0 in | Wt 175.6 lb

## 2015-03-10 DIAGNOSIS — M109 Gout, unspecified: Secondary | ICD-10-CM

## 2015-03-10 DIAGNOSIS — J40 Bronchitis, not specified as acute or chronic: Secondary | ICD-10-CM

## 2015-03-10 MED ORDER — AMOXICILLIN-POT CLAVULANATE 875-125 MG PO TABS: 1.0000 | ORAL_TABLET | Freq: Two times a day (BID) | ORAL | Status: DC

## 2015-03-10 MED ORDER — INDOMETHACIN 25 MG PO CAPS: 25.0000 mg | ORAL_CAPSULE | Freq: Two times a day (BID) | ORAL | Status: DC | PRN

## 2015-03-10 NOTE — Progress Notes (Signed)
Date:  03/10/2015   Name:  Aaron [name]   DOB:  1956-04-24   MRN:  [id number]   Chief Complaint: URI Patient reports onset of cold-like symptoms with some head congestion about one week ago. After that he developed symptoms of cough bringing up yellow phlegm 2 days ago. Yesterday he began to feel like his ears were full, not draining or painful but with pressure. He's been taking Robitussin over-the-counter. He does continue to smoke cigarettes. He tried Chantix briefly and felt like it was helpful but never followed through with a prescription.  Review of Systems:  Review of Systems  Constitutional: Negative for fever, chills and fatigue.  HENT: Positive for ear pain and sinus pressure. Negative for hearing loss, sore throat and trouble swallowing.   Respiratory: Positive for cough and shortness of breath. Negative for chest tightness.   Cardiovascular: Negative for chest pain.  Neurological: Negative for dizziness and headaches.    Patient Active Problem List   Diagnosis Date Noted  . Controlled gout 12/22/2014  . CAD in native artery 10/09/2014  . Chronic gouty arthropathy 10/09/2014  . Essential (primary) hypertension 10/09/2014  . Familial multiple lipoprotein-type hyperlipidemia 10/09/2014  . Dupuytren's disease of palm 10/09/2014  . Special screening for malignant neoplasm of prostate 10/09/2014  . Psoriasis 10/09/2014  . Pain in shoulder 10/09/2014  . Compulsive tobacco user syndrome 10/09/2014  . Contracture of palmar fascia 10/09/2014  . H/O cardiac catheterization 01/27/2014  . HLD (hyperlipidemia) 01/27/2014  . Peripheral blood vessel disorder 01/27/2014    Prior to Admission medications   Medication Sig Start Date End Date Taking? Authorizing Provider  allopurinol (ZYLOPRIM) 100 MG tablet Take 1 tablet by mouth daily. 04/13/14  Yes Historical Provider, MD  aspirin 325 MG EC tablet Take 1 tablet by mouth daily.   Yes Historical Provider, MD  cilostazol (PLETAL) 50  MG tablet Take 1 tablet by mouth 2 (two) times daily.    Yes Historical Provider, MD  clopidogrel (PLAVIX) 75 MG tablet Take 1 tablet by mouth daily.   Yes Historical Provider, MD  indomethacin (INDOCIN) 25 MG capsule Take 1 capsule (25 mg total) by mouth 2 (two) times daily as needed. 12/28/14  Yes Glean Hess, MD  isosorbide mononitrate (IMDUR) 30 MG 24 hr tablet Take 3 tablets by mouth daily.   Yes Historical Provider, MD  lisinopril (PRINIVIL,ZESTRIL) 10 MG tablet Take 1 tablet by mouth daily.   Yes Historical Provider, MD  metoprolol tartrate (LOPRESSOR) 25 MG tablet Take 1 tablet by mouth 2 (two) times daily.   Yes Historical Provider, MD  nitroGLYCERIN (NITROSTAT) 0.4 MG SL tablet Place 1 tablet under the tongue as needed.   Yes Historical Provider, MD  simvastatin (ZOCOR) 20 MG tablet Take 1 tablet by mouth at bedtime.   Yes Historical Provider, MD  triamcinolone cream (KENALOG) 0.5 % Apply 1 application topically 2 (two) times daily. 04/26/14  Yes Historical Provider, MD  varenicline (CHANTIX) 1 MG tablet Take 1 tablet by mouth 2 (two) times daily. 08/09/14   Historical Provider, MD    No Known Allergies  Past Surgical History  Procedure Laterality Date  . Appendectomy    . Cardiac stents   2012    Social History  Substance Use Topics  . Smoking status: Current Every Day Smoker  . Smokeless tobacco: None     Comment: patient not ready  . Alcohol Use: 8.4 oz/week    14 Standard drinks or equivalent per week  Medication list has been reviewed and updated.  Physical Examination:  Physical Exam  Constitutional: He is oriented to person, place, and time. He appears well-developed. No distress.  HENT:  Head: Normocephalic and atraumatic.  Right Ear: Ear canal normal. Tympanic membrane is scarred and retracted.  Left Ear: Ear canal normal. Tympanic membrane is scarred and retracted.  Nose: Nose normal. Right sinus exhibits no maxillary sinus tenderness and no frontal  sinus tenderness. Left sinus exhibits no maxillary sinus tenderness and no frontal sinus tenderness.  Eyes: Conjunctivae are normal. Right eye exhibits no discharge. Left eye exhibits no discharge. No scleral icterus.  Cardiovascular: Normal rate, regular rhythm and normal heart sounds.   Pulmonary/Chest: Effort normal. No respiratory distress. He has rhonchi in the right lower field and the left lower field.  Musculoskeletal: Normal range of motion.  Neurological: He is alert and oriented to person, place, and time.  Skin: Skin is warm and dry. No rash noted.  Psychiatric: He has a normal mood and affect. His behavior is normal. Thought content normal.    BP 110/80 mmHg  Pulse 80  Temp(Src) 98.3 F (36.8 C)  Ht 6\' 1"  (1.854 m)  Wt 175 lb 9.6 oz (79.652 kg)  BMI 23.17 kg/m2  SpO2 99%  Assessment and Plan: 1. Bronchitis Continue Robitussin over-the-counter for cough and congestion. - amoxicillin-clavulanate (AUGMENTIN) 875-125 MG per tablet; Take 1 tablet by mouth 2 (two) times daily.  Dispense: 20 tablet; Refill: 0   Halina Maidens, MD [facility]  03/10/2015

## 2015-03-11 ENCOUNTER — Ambulatory Visit: Payer: Self-pay | Admitting: Internal Medicine

## 2015-05-19 ENCOUNTER — Encounter: Payer: Self-pay | Admitting: Internal Medicine

## 2015-05-19 ENCOUNTER — Ambulatory Visit (INDEPENDENT_AMBULATORY_CARE_PROVIDER_SITE_OTHER): Payer: PPO | Admitting: Internal Medicine

## 2015-05-19 ENCOUNTER — Other Ambulatory Visit
Admission: RE | Admit: 2015-05-19 | Discharge: 2015-05-19 | Disposition: A | Discharge status: Home / Self Care | Payer: PPO | Source: Ambulatory Visit | Attending: Internal Medicine | Admitting: Internal Medicine

## 2015-05-19 VITALS — BP 110/68 | HR 72 | Ht 73.0 in | Wt 179.6 lb

## 2015-05-19 DIAGNOSIS — Z125 Encounter for screening for malignant neoplasm of prostate: Secondary | ICD-10-CM

## 2015-05-19 DIAGNOSIS — M7522 Bicipital tendinitis, left shoulder: Secondary | ICD-10-CM | POA: Diagnosis not present

## 2015-05-19 DIAGNOSIS — E784 Other hyperlipidemia: Secondary | ICD-10-CM | POA: Diagnosis not present

## 2015-05-19 DIAGNOSIS — M109 Gout, unspecified: Secondary | ICD-10-CM

## 2015-05-19 DIAGNOSIS — Z0000 Encounter for general adult medical examination without abnormal findings: Secondary | ICD-10-CM | POA: Diagnosis not present

## 2015-05-19 DIAGNOSIS — I10 Essential (primary) hypertension: Secondary | ICD-10-CM

## 2015-05-19 DIAGNOSIS — I25119 Atherosclerotic heart disease of native coronary artery with unspecified angina pectoris: Secondary | ICD-10-CM | POA: Diagnosis not present

## 2015-05-19 DIAGNOSIS — Z029 Encounter for administrative examinations, unspecified: Secondary | ICD-10-CM | POA: Diagnosis present

## 2015-05-19 DIAGNOSIS — Z23 Encounter for immunization: Secondary | ICD-10-CM | POA: Diagnosis not present

## 2015-05-19 DIAGNOSIS — E7849 Other hyperlipidemia: Secondary | ICD-10-CM

## 2015-05-19 LAB — CBC WITH DIFFERENTIAL/PLATELET
Basophils Absolute: 0.1 10*3/uL (ref 0–0.1)
Basophils Relative: 1 %
Eosinophils Absolute: 0.1 10*3/uL (ref 0–0.7)
HEMATOCRIT: 49.5 % (ref 40.0–52.0)
Hemoglobin: 16.9 g/dL (ref 13.0–18.0)
Lymphs Abs: 1.9 10*3/uL (ref 1.0–3.6)
MCH: 32.3 pg (ref 26.0–34.0)
MCHC: 34.1 g/dL (ref 32.0–36.0)
MCV: 94.7 fL (ref 80.0–100.0)
MONO ABS: 0.4 10*3/uL (ref 0.2–1.0)
NEUTROS ABS: 4.9 10*3/uL (ref 1.4–6.5)
Neutrophils Relative %: 67 %
Platelets: 218 10*3/uL (ref 150–440)
RBC: 5.22 MIL/uL (ref 4.40–5.90)
RDW: 14.7 % — AB (ref 11.5–14.5)
WBC: 7.4 10*3/uL (ref 3.8–10.6)

## 2015-05-19 LAB — LIPID PANEL
CHOL/HDL RATIO: 6 ratio
CHOLESTEROL: 227 mg/dL — AB (ref 0–200)
HDL: 38 mg/dL — AB (ref >40)
LDL Cholesterol: 148 mg/dL — ABNORMAL HIGH (ref 0–99)
TRIGLYCERIDES: 207 mg/dL — AB (ref <150)
VLDL: 41 mg/dL — ABNORMAL HIGH (ref 0–40)

## 2015-05-19 LAB — COMPREHENSIVE METABOLIC PANEL
ALT: 20 U/L (ref 17–63)
ANION GAP: 11 (ref 5–15)
AST: 23 U/L (ref 15–41)
Albumin: 4.7 g/dL (ref 3.5–5.0)
Alkaline Phosphatase: 87 U/L (ref 38–126)
BILIRUBIN TOTAL: 0.5 mg/dL (ref 0.3–1.2)
BUN: 22 mg/dL — AB (ref 6–20)
CO2: 29 mmol/L (ref 22–32)
Calcium: 9.6 mg/dL (ref 8.9–10.3)
Chloride: 94 mmol/L — ABNORMAL LOW (ref 101–111)
Creatinine, Ser: 0.91 mg/dL (ref 0.61–1.24)
Glucose, Bld: 110 mg/dL — ABNORMAL HIGH (ref 65–99)
POTASSIUM: 4.4 mmol/L (ref 3.5–5.1)
Sodium: 134 mmol/L — ABNORMAL LOW (ref 135–145)
TOTAL PROTEIN: 8.2 g/dL — AB (ref 6.5–8.1)

## 2015-05-19 LAB — URIC ACID: URIC ACID, SERUM: 6.8 mg/dL (ref 4.4–7.6)

## 2015-05-19 LAB — TSH: TSH: 1.822 u[IU]/mL (ref 0.350–4.500)

## 2015-05-19 MED ORDER — METHYLPREDNISOLONE 4 MG PO TBPK: ORAL_TABLET | ORAL | Status: DC

## 2015-05-19 NOTE — Patient Instructions (Signed)
Smoking Cessation, Tips for Success If you are ready to quit smoking, congratulations! You have chosen to help yourself be healthier. Cigarettes bring nicotine, tar, carbon monoxide, and other irritants into your body. Your lungs, heart, and blood vessels will be able to work better without these poisons. There are many different ways to quit smoking. Nicotine gum, nicotine patches, a nicotine inhaler, or nicotine nasal spray can help with physical craving. Hypnosis, support groups, and medicines help break the habit of smoking. WHAT THINGS CAN I DO TO MAKE QUITTING EASIER?  Here are some tips to help you quit for good:  Pick a date when you will quit smoking completely. Tell all of your friends and family about your plan to quit on that date.  Do not try to slowly cut down on the number of cigarettes you are smoking. Pick a quit date and quit smoking completely starting on that day.  Throw away all cigarettes.   Clean and remove all ashtrays from your home, work, and car.  On a card, write down your reasons for quitting. Carry the card with you and read it when you get the urge to smoke.  Cleanse your body of nicotine. Drink enough water and fluids to keep your urine clear or pale yellow. Do this after quitting to flush the nicotine from your body.  Learn to predict your moods. Do not let a bad situation be your excuse to have a cigarette. Some situations in your life might tempt you into wanting a cigarette.  Never have "just one" cigarette. It leads to wanting another and another. Remind yourself of your decision to quit.  Change habits associated with smoking. If you smoked while driving or when feeling stressed, try other activities to replace smoking. Stand up when drinking your coffee. Brush your teeth after eating. Sit in a different chair when you read the paper. Avoid alcohol while trying to quit, and try to drink fewer caffeinated beverages. Alcohol and caffeine may urge you to  smoke.  Avoid foods and drinks that can trigger a desire to smoke, such as sugary or spicy foods and alcohol.  Ask people who smoke not to smoke around you.  Have something planned to do right after eating or having a cup of coffee. For example, plan to take a walk or exercise.  Try a relaxation exercise to calm you down and decrease your stress. Remember, you may be tense and nervous for the first 2 weeks after you quit, but this will pass.  Find new activities to keep your hands busy. Play with a pen, coin, or rubber band. Doodle or draw things on paper.  Brush your teeth right after eating. This will help cut down on the craving for the taste of tobacco after meals. You can also try mouthwash.   Use oral substitutes in place of cigarettes. Try using lemon drops, carrots, cinnamon sticks, or chewing gum. Keep them handy so they are available when you have the urge to smoke.  When you have the urge to smoke, try deep breathing.  Designate your home as a nonsmoking area.  If you are a heavy smoker, ask your health care provider about a prescription for nicotine chewing gum. It can ease your withdrawal from nicotine.  Reward yourself. Set aside the cigarette money you save and buy yourself something nice.  Look for support from others. Join a support group or smoking cessation program. Ask someone at home or at work to help you with your plan   to quit smoking.  Always ask yourself, "Do I need this cigarette or is this just a reflex?" Tell yourself, "Today, I choose not to smoke," or "I do not want to smoke." You are reminding yourself of your decision to quit.  Do not replace cigarette smoking with electronic cigarettes (commonly called e-cigarettes). The safety of e-cigarettes is unknown, and some may contain harmful chemicals.  If you relapse, do not give up! Plan ahead and think about what you will do the next time you get the urge to smoke. HOW WILL I FEEL WHEN I QUIT SMOKING? You  may have symptoms of withdrawal because your body is used to nicotine (the addictive substance in cigarettes). You may crave cigarettes, be irritable, feel very hungry, cough often, get headaches, or have difficulty concentrating. The withdrawal symptoms are only temporary. They are strongest when you first quit but will go away within 10-14 days. When withdrawal symptoms occur, stay in control. Think about your reasons for quitting. Remind yourself that these are signs that your body is healing and getting used to being without cigarettes. Remember that withdrawal symptoms are easier to treat than the major diseases that smoking can cause.  Even after the withdrawal is over, expect periodic urges to smoke. However, these cravings are generally short lived and will go away whether you smoke or not. Do not smoke! WHAT RESOURCES ARE AVAILABLE TO HELP ME QUIT SMOKING? Your health care provider can direct you to community resources or hospitals for support, which may include:  Group support.  Education.  Hypnosis.  Therapy.   This information is not intended to replace advice given to you by your health care provider. Make sure you discuss any questions you have with your health care provider.   Document Released: 03/23/2004 Document Revised: 07/16/2014 Document Reviewed: 12/11/2012 [employer] Interactive Patient Education 2016 [employer]  

## 2015-05-19 NOTE — Progress Notes (Signed)
Date:  05/19/2015   Name:  Aaron [name]   DOB:  [birth year]   MRN:  [id number]   Chief Complaint: Annual Exam Aaron [name] is a 59 y.o. male who presents today for his Complete Annual Exam. He feels well. He reports exercising some. He reports he is sleeping well.   Heart Problem This is a chronic problem. The current episode started more than 1 year ago. The problem has been unchanged (had MI and stents 06/2015). Associated symptoms include myalgias. Pertinent negatives include no abdominal pain, arthralgias, chest pain, chills, coughing, fatigue, fever, headaches, joint swelling, nausea, neck pain, numbness, rash, vomiting or weakness. Associated symptoms comments: Jaw pain one time at night - he is due for follow up with cardiology  . Nothing aggravates the symptoms.  Hypertension This is a chronic problem. The current episode started more than 1 year ago. Pertinent negatives include no chest pain, headaches, neck pain, palpitations or shortness of breath. There are no associated agents to hypertension. Past treatments include beta blockers and ACE inhibitors. The current treatment provides significant improvement. Hypertensive end-organ damage includes CAD/MI.  Hyperlipidemia This is a chronic problem. The current episode started more than 1 year ago. The problem is controlled. Recent lipid tests were reviewed and are normal. Associated symptoms include myalgias. Pertinent negatives include no chest pain or shortness of breath. Current antihyperlipidemic treatment includes statins. The current treatment provides significant improvement of lipids.  Arm Pain  The pain is present in the upper left arm. The quality of the pain is described as aching. The pain does not radiate. The pain is mild. Associated symptoms include muscle weakness. Pertinent negatives include no chest pain, numbness or tingling. The symptoms are aggravated by lifting (Discomfort over the left biceps tendon insertion and  the lateral epicondyle of the elbow). He has tried nothing for the symptoms.     Review of Systems  Constitutional: Negative for fever, chills and fatigue.  HENT: Positive for dental problem. Negative for hearing loss, nosebleeds, trouble swallowing and voice change.   Eyes: Negative for visual disturbance.  Respiratory: Negative for cough, chest tightness, shortness of breath and wheezing.   Cardiovascular: Negative for chest pain, palpitations and leg swelling.  Gastrointestinal: Negative for nausea, vomiting, abdominal pain, diarrhea, constipation and blood in stool.  Endocrine: Negative for polydipsia and polyuria.  Genitourinary: Negative for dysuria, urgency, frequency, hematuria and testicular pain.  Musculoskeletal: Positive for myalgias. Negative for joint swelling, arthralgias, neck pain and neck stiffness.  Skin: Negative for color change and rash.  Allergic/Immunologic: Negative for environmental allergies.  Neurological: Negative for dizziness, tingling, tremors, weakness, numbness and headaches.  Hematological: Negative for adenopathy. Does not bruise/bleed easily.  Psychiatric/Behavioral: Negative for confusion, sleep disturbance and dysphoric mood.    Patient Active Problem List   Diagnosis Date Noted  . Controlled gout 12/22/2014  . CAD in native artery 10/09/2014  . Chronic gouty arthropathy 10/09/2014  . Essential (primary) hypertension 10/09/2014  . Familial multiple lipoprotein-type hyperlipidemia 10/09/2014  . Dupuytren's disease of palm 10/09/2014  . Special screening for malignant neoplasm of prostate 10/09/2014  . Psoriasis 10/09/2014  . Pain in shoulder 10/09/2014  . Compulsive tobacco user syndrome 10/09/2014  . Contracture of palmar fascia 10/09/2014  . H/O cardiac catheterization 01/27/2014  . HLD (hyperlipidemia) 01/27/2014  . Peripheral blood vessel disorder ([place]) 01/27/2014    Prior to Admission medications   Medication Sig Start Date End Date  Taking? Authorizing Provider  allopurinol (ZYLOPRIM) 100 MG tablet Take  1 tablet by mouth daily. 04/13/14  Yes Historical Provider, MD  aspirin 325 MG EC tablet Take 1 tablet by mouth daily.   Yes Historical Provider, MD  cilostazol (PLETAL) 50 MG tablet Take 1 tablet by mouth 2 (two) times daily.    Yes Historical Provider, MD  clopidogrel (PLAVIX) 75 MG tablet Take 1 tablet by mouth daily.   Yes Historical Provider, MD  indomethacin (INDOCIN) 25 MG capsule Take 1 capsule (25 mg total) by mouth 2 (two) times daily as needed. 03/10/15  Yes Glean Hess, MD  isosorbide mononitrate (IMDUR) 30 MG 24 hr tablet Take 3 tablets by mouth daily.   Yes Historical Provider, MD  lisinopril (PRINIVIL,ZESTRIL) 10 MG tablet Take 1 tablet by mouth daily.   Yes Historical Provider, MD  metoprolol tartrate (LOPRESSOR) 25 MG tablet Take 1 tablet by mouth 2 (two) times daily.   Yes Historical Provider, MD  nitroGLYCERIN (NITROSTAT) 0.4 MG SL tablet Place 1 tablet under the tongue as needed.   Yes Historical Provider, MD  simvastatin (ZOCOR) 20 MG tablet Take 1 tablet by mouth at bedtime.   Yes Historical Provider, MD  triamcinolone cream (KENALOG) 0.5 % Apply 1 application topically 2 (two) times daily. 04/26/14  Yes Historical Provider, MD  varenicline (CHANTIX) 1 MG tablet Take 1 tablet by mouth 2 (two) times daily. 08/09/14   Historical Provider, MD    No Known Allergies  Past Surgical History  Procedure Laterality Date  . Appendectomy    . Cardiac stents   2012    Social History  Substance Use Topics  . Smoking status: Current Every Day Smoker  . Smokeless tobacco: None     Comment: patient not ready  . Alcohol Use: 8.4 oz/week    14 Standard drinks or equivalent per week     Medication list has been reviewed and updated.   Physical Exam  Constitutional: He is oriented to person, place, and time. He appears well-developed and well-nourished.  HENT:  Head: Normocephalic.  Right Ear: Tympanic  membrane, external ear and ear canal normal.  Left Ear: Tympanic membrane, external ear and ear canal normal.  Nose: Nose normal.  Mouth/Throat: Uvula is midline and oropharynx is clear and moist.  Eyes: Conjunctivae and EOM are normal. Pupils are equal, round, and reactive to light.  Neck: Normal range of motion. Neck supple. Carotid bruit is not present. No thyromegaly present.  Cardiovascular: Normal rate, regular rhythm, normal heart sounds and intact distal pulses.   Pulmonary/Chest: Effort normal and breath sounds normal. He has no wheezes. Right breast exhibits no mass. Left breast exhibits no mass.  Abdominal: Soft. Normal appearance and bowel sounds are normal. There is no hepatosplenomegaly. There is no tenderness. There is no rebound and no guarding.  Musculoskeletal: Normal range of motion.  Tenderness over the left biceps tendon and over the lateral epicondyles. No weakness or swelling noted. Reflexes are normal. Grip is normal.  Lymphadenopathy:    He has no cervical adenopathy.  Neurological: He is alert and oriented to person, place, and time. He has normal reflexes.  Skin: Skin is warm, dry and intact.  Psychiatric: He has a normal mood and affect. His speech is normal and behavior is normal. Judgment and thought content normal.  Nursing note and vitals reviewed.   BP 110/68 mmHg  Pulse 72  Ht 6\' 1"  (1.854 m)  Wt 179 lb 9.6 oz (81.466 kg)  BMI 23.70 kg/m2  Assessment and Plan: 1. Annual physical exam  Patient encouraged to continue cutting back on cigarettes and quit completely - POCT urinalysis dipstick - Hepatitis C antibody  2. Flu vaccine need - Flu Vaccine QUAD 36+ mos PF IM (Fluarix & Fluzone Quad PF)  3. Prostate cancer screening DRE not performed due to lack of symptoms - PSA  4. Coronary artery disease involving native coronary artery of native heart with angina pectoris ([place]) Jaw pain is of concern for angina-recommend follow-up with cardiology as soon  as possible  5. Essential (primary) hypertension Controlled - CBC with Differential/Platelet - Comprehensive metabolic panel - TSH  6. Familial multiple lipoprotein-type hyperlipidemia On appropriate statin therapy - Lipid panel  7. Controlled gout On allopurinol daily and indomethacin when necessary - Uric acid  8. Biceps tendonitis, left Modify activity; return if not improved - methylPREDNISolone (MEDROL DOSEPAK) 4 MG TBPK tablet; Take 6 pills on day 1 the 5 pills day 2 then 4 pills day 3 then 3 pills day 4 then 2 pills day 5 then one pills day 6 then stop  Dispense: 21 tablet; Refill: 0   Halina Maidens, MD [facility]  05/19/2015

## 2015-05-20 LAB — PSA: PSA: 0.98 ng/mL (ref 0.00–4.00)

## 2015-05-21 ENCOUNTER — Other Ambulatory Visit: Payer: Self-pay | Admitting: Internal Medicine

## 2015-05-21 LAB — HEPATITIS C ANTIBODY: HCV Ab: <0.1 s/co ratio (ref 0.0–0.9)

## 2015-05-21 MED ORDER — SIMVASTATIN 40 MG PO TABS: 40.0000 mg | ORAL_TABLET | Freq: Every day | ORAL | Status: DC

## 2015-07-25 ENCOUNTER — Ambulatory Visit: Payer: Self-pay | Admitting: Internal Medicine

## 2015-07-27 ENCOUNTER — Ambulatory Visit: Payer: Self-pay | Admitting: Internal Medicine

## 2015-07-27 ENCOUNTER — Ambulatory Visit (INDEPENDENT_AMBULATORY_CARE_PROVIDER_SITE_OTHER): Payer: PPO | Admitting: Internal Medicine

## 2015-07-27 ENCOUNTER — Encounter: Payer: Self-pay | Admitting: Internal Medicine

## 2015-07-27 VITALS — BP 108/64 | HR 82 | Ht 73.0 in | Wt 181.0 lb

## 2015-07-27 DIAGNOSIS — R7303 Prediabetes: Secondary | ICD-10-CM | POA: Insufficient documentation

## 2015-07-27 DIAGNOSIS — R42 Dizziness and giddiness: Secondary | ICD-10-CM

## 2015-07-27 DIAGNOSIS — R21 Rash and other nonspecific skin eruption: Secondary | ICD-10-CM | POA: Diagnosis not present

## 2015-07-27 DIAGNOSIS — I10 Essential (primary) hypertension: Secondary | ICD-10-CM

## 2015-07-27 DIAGNOSIS — R7309 Other abnormal glucose: Secondary | ICD-10-CM | POA: Diagnosis not present

## 2015-07-27 DIAGNOSIS — R739 Hyperglycemia, unspecified: Secondary | ICD-10-CM

## 2015-07-27 MED ORDER — TRIAMCINOLONE ACETONIDE 0.5 % EX CREA
1.0000 | TOPICAL_CREAM | Freq: Two times a day (BID) | CUTANEOUS | Status: DC
Start: 2015-07-27 — End: 2017-04-02

## 2015-07-27 MED ORDER — FEXOFENADINE-PSEUDOEPHED ER 60-120 MG PO TB12: 1.0000 | ORAL_TABLET | Freq: Two times a day (BID) | ORAL | Status: DC

## 2015-07-27 MED ORDER — FLUTICASONE PROPIONATE 50 MCG/ACT NA SUSP: 2.0000 | Freq: Every day | NASAL | Status: DC

## 2015-07-27 MED ORDER — MECLIZINE HCL 25 MG PO TABS: 25.0000 mg | ORAL_TABLET | Freq: Three times a day (TID) | ORAL | Status: DC | PRN

## 2015-07-27 NOTE — Progress Notes (Signed)
Date:  07/27/2015   Name:  Aaron [name]   DOB:  09/05/1955   MRN:  [id number]   Chief Complaint: Follow-up; Hypertension; and Dizziness Hypertension This is a chronic problem. The current episode started more than 1 year ago. The problem is unchanged. The problem is controlled. Pertinent negatives include no chest pain, headaches, palpitations or shortness of breath.  Dizziness This is a new problem. The current episode started 1 to 4 weeks ago. The problem occurs 2 to 4 times per day. The problem has been unchanged. Associated symptoms include nausea and vertigo. Pertinent negatives include no chest pain, coughing, fever, headaches, sore throat, swollen glands or vomiting. The symptoms are aggravated by bending and twisting (and turning over in bed). He has tried nothing for the symptoms.      Review of Systems  Constitutional: Negative for fever.  HENT: Positive for postnasal drip and rhinorrhea. Negative for hearing loss, sinus pressure and sore throat.   Eyes: Negative for visual disturbance.  Respiratory: Negative for cough, chest tightness and shortness of breath.   Cardiovascular: Negative for chest pain and palpitations.  Gastrointestinal: Positive for nausea. Negative for vomiting.  Neurological: Positive for dizziness and vertigo. Negative for syncope and headaches.    Patient Active Problem List   Diagnosis Date Noted  . Controlled gout 12/22/2014  . CAD in native artery 10/09/2014  . Essential (primary) hypertension 10/09/2014  . Familial multiple lipoprotein-type hyperlipidemia 10/09/2014  . Dupuytren's disease of palm 10/09/2014  . Special screening for malignant neoplasm of prostate 10/09/2014  . Psoriasis 10/09/2014  . Pain in shoulder 10/09/2014  . Compulsive tobacco user syndrome 10/09/2014  . H/O cardiac catheterization 01/27/2014  . Peripheral blood vessel disorder ([place]) 01/27/2014    Prior to Admission medications   Medication Sig Start Date End  Date Taking? Authorizing Provider  allopurinol (ZYLOPRIM) 100 MG tablet Take 1 tablet by mouth daily. 04/13/14  Yes Historical Provider, MD  aspirin 325 MG EC tablet Take 1 tablet by mouth daily.   Yes Historical Provider, MD  cilostazol (PLETAL) 50 MG tablet Take 1 tablet by mouth 2 (two) times daily.    Yes Historical Provider, MD  clopidogrel (PLAVIX) 75 MG tablet Take 1 tablet by mouth daily.   Yes Historical Provider, MD  indomethacin (INDOCIN) 25 MG capsule Take 1 capsule (25 mg total) by mouth 2 (two) times daily as needed. 03/10/15  Yes Glean Hess, MD  isosorbide mononitrate (IMDUR) 30 MG 24 hr tablet Take 3 tablets by mouth daily.   Yes Historical Provider, MD  lisinopril (PRINIVIL,ZESTRIL) 10 MG tablet Take 1 tablet by mouth daily.   Yes Historical Provider, MD  metoprolol tartrate (LOPRESSOR) 25 MG tablet Take 1 tablet by mouth 2 (two) times daily.   Yes Historical Provider, MD  nitroGLYCERIN (NITROSTAT) 0.4 MG SL tablet Place 1 tablet under the tongue as needed.   Yes Historical Provider, MD  simvastatin (ZOCOR) 40 MG tablet Take 1 tablet (40 mg total) by mouth at bedtime. 05/21/15  Yes Glean Hess, MD  triamcinolone cream (KENALOG) 0.5 % Apply 1 application topically 2 (two) times daily. 04/26/14  Yes Historical Provider, MD  varenicline (CHANTIX) 1 MG tablet Take 1 tablet by mouth 2 (two) times daily. Reported on 07/27/2015 08/09/14   Historical Provider, MD    No Known Allergies  Past Surgical History  Procedure Laterality Date  . Appendectomy    . Cardiac stents   2012    Social History  Substance Use Topics  . Smoking status: Current Every Day Smoker -- 0.50 packs/day for 40 years    Types: Cigarettes  . Smokeless tobacco: None     Comment: patient not ready  . Alcohol Use: 8.4 oz/week    14 Standard drinks or equivalent per week     Comment: occasional     Medication list has been reviewed and updated.   Physical Exam  Constitutional: He is oriented to  person, place, and time. He appears well-developed. No distress.  HENT:  Head: Normocephalic and atraumatic.  Right Ear: Tympanic membrane is retracted. Tympanic membrane is not erythematous.  Left Ear: Tympanic membrane is retracted. Tympanic membrane is not erythematous.  Nose: Right sinus exhibits no maxillary sinus tenderness and no frontal sinus tenderness. Left sinus exhibits no maxillary sinus tenderness and no frontal sinus tenderness.  Eyes: Conjunctivae are normal. Right eye exhibits nystagmus.  Cardiovascular: Normal rate, regular rhythm and normal heart sounds.   Pulmonary/Chest: Effort normal and breath sounds normal. No respiratory distress.  Musculoskeletal: Normal range of motion.  Neurological: He is alert and oriented to person, place, and time.  Skin: Skin is warm and dry. No rash noted.  Psychiatric: He has a normal mood and affect. His speech is normal and behavior is normal. Thought content normal.    BP 108/64 mmHg  Pulse 82  Ht 6\' 1"  (1.854 m)  Wt 181 lb (82.101 kg)  BMI 23.89 kg/m2  SpO2 98%  Assessment and Plan: 1. Vertigo Should resolve over the next several weeks If persistent will refer to ENT - meclizine (ANTIVERT) 25 MG tablet; Take 1 tablet (25 mg total) by mouth 3 (three) times daily as needed for dizziness.  Dispense: 30 tablet; Refill: 0 - fluticasone (FLONASE) 50 MCG/ACT nasal spray; Place 2 sprays into both nostrils daily.  Dispense: 16 g; Refill: 1 - fexofenadine-pseudoephedrine (ALLEGRA-D) 60-120 MG 12 hr tablet; Take 1 tablet by mouth 2 (two) times daily.  Dispense: 60 tablet; Refill: 0  2. Essential (primary) hypertension controlled  3. Rash Intermittent; we will refill cortisone cream  4. Elevated blood sugar Will check at next visit  Halina Maidens, MD [facility]  07/27/2015

## 2015-11-04 DIAGNOSIS — I10 Essential (primary) hypertension: Secondary | ICD-10-CM | POA: Diagnosis not present

## 2015-11-04 DIAGNOSIS — I70213 Atherosclerosis of native arteries of extremities with intermittent claudication, bilateral legs: Secondary | ICD-10-CM | POA: Diagnosis not present

## 2015-11-04 DIAGNOSIS — F17200 Nicotine dependence, unspecified, uncomplicated: Secondary | ICD-10-CM | POA: Diagnosis not present

## 2015-11-04 DIAGNOSIS — E785 Hyperlipidemia, unspecified: Secondary | ICD-10-CM | POA: Diagnosis not present

## 2015-11-28 DIAGNOSIS — E7800 Pure hypercholesterolemia, unspecified: Secondary | ICD-10-CM | POA: Diagnosis not present

## 2015-11-28 DIAGNOSIS — R079 Chest pain, unspecified: Secondary | ICD-10-CM | POA: Diagnosis not present

## 2015-11-28 DIAGNOSIS — F17200 Nicotine dependence, unspecified, uncomplicated: Secondary | ICD-10-CM | POA: Diagnosis not present

## 2015-11-28 DIAGNOSIS — I739 Peripheral vascular disease, unspecified: Secondary | ICD-10-CM | POA: Diagnosis not present

## 2015-11-28 DIAGNOSIS — I10 Essential (primary) hypertension: Secondary | ICD-10-CM | POA: Diagnosis not present

## 2015-11-28 DIAGNOSIS — Z9889 Other specified postprocedural states: Secondary | ICD-10-CM | POA: Diagnosis not present

## 2015-11-28 DIAGNOSIS — E784 Other hyperlipidemia: Secondary | ICD-10-CM | POA: Diagnosis not present

## 2015-11-30 DIAGNOSIS — R079 Chest pain, unspecified: Secondary | ICD-10-CM | POA: Diagnosis not present

## 2015-11-30 DIAGNOSIS — Z9889 Other specified postprocedural states: Secondary | ICD-10-CM | POA: Diagnosis not present

## 2015-12-06 DIAGNOSIS — R079 Chest pain, unspecified: Secondary | ICD-10-CM | POA: Diagnosis not present

## 2015-12-06 DIAGNOSIS — F17200 Nicotine dependence, unspecified, uncomplicated: Secondary | ICD-10-CM | POA: Diagnosis not present

## 2015-12-06 DIAGNOSIS — I10 Essential (primary) hypertension: Secondary | ICD-10-CM | POA: Diagnosis not present

## 2015-12-06 DIAGNOSIS — I739 Peripheral vascular disease, unspecified: Secondary | ICD-10-CM | POA: Diagnosis not present

## 2015-12-06 DIAGNOSIS — E7800 Pure hypercholesterolemia, unspecified: Secondary | ICD-10-CM | POA: Diagnosis not present

## 2015-12-06 DIAGNOSIS — I25110 Atherosclerotic heart disease of native coronary artery with unstable angina pectoris: Secondary | ICD-10-CM | POA: Diagnosis not present

## 2015-12-06 DIAGNOSIS — Z9889 Other specified postprocedural states: Secondary | ICD-10-CM | POA: Diagnosis not present

## 2015-12-28 DIAGNOSIS — F17200 Nicotine dependence, unspecified, uncomplicated: Secondary | ICD-10-CM | POA: Diagnosis not present

## 2015-12-28 DIAGNOSIS — E785 Hyperlipidemia, unspecified: Secondary | ICD-10-CM | POA: Diagnosis not present

## 2015-12-28 DIAGNOSIS — I739 Peripheral vascular disease, unspecified: Secondary | ICD-10-CM | POA: Diagnosis not present

## 2015-12-28 DIAGNOSIS — I10 Essential (primary) hypertension: Secondary | ICD-10-CM | POA: Diagnosis not present

## 2015-12-28 DIAGNOSIS — I70213 Atherosclerosis of native arteries of extremities with intermittent claudication, bilateral legs: Secondary | ICD-10-CM | POA: Diagnosis not present

## 2016-03-28 DIAGNOSIS — H524 Presbyopia: Secondary | ICD-10-CM | POA: Diagnosis not present

## 2016-04-06 ENCOUNTER — Ambulatory Visit (INDEPENDENT_AMBULATORY_CARE_PROVIDER_SITE_OTHER): Payer: PPO

## 2016-04-06 DIAGNOSIS — Z23 Encounter for immunization: Secondary | ICD-10-CM | POA: Diagnosis not present

## 2016-05-11 ENCOUNTER — Other Ambulatory Visit: Payer: Self-pay | Admitting: Internal Medicine

## 2016-05-11 ENCOUNTER — Telehealth: Payer: Self-pay | Admitting: Internal Medicine

## 2016-05-11 DIAGNOSIS — M109 Gout, unspecified: Secondary | ICD-10-CM

## 2016-05-11 MED ORDER — INDOMETHACIN 25 MG PO CAPS: 25.0000 mg | ORAL_CAPSULE | Freq: Two times a day (BID) | ORAL | 5 refills | Status: DC | PRN

## 2016-05-11 NOTE — Telephone Encounter (Signed)
Patient is requesting refills on his indomethacin 25 mg and he would like it called into the [facility] on [address].

## 2016-06-12 ENCOUNTER — Emergency Department: Payer: PPO

## 2016-06-12 ENCOUNTER — Emergency Department
Admission: EM | Admit: 2016-06-12 | Discharge: 2016-06-12 | Disposition: A | Discharge status: Home / Self Care | Payer: PPO | Attending: Emergency Medicine | Admitting: Emergency Medicine

## 2016-06-12 ENCOUNTER — Encounter: Payer: Self-pay | Admitting: Emergency Medicine

## 2016-06-12 DIAGNOSIS — F17210 Nicotine dependence, cigarettes, uncomplicated: Secondary | ICD-10-CM | POA: Insufficient documentation

## 2016-06-12 DIAGNOSIS — M79602 Pain in left arm: Secondary | ICD-10-CM | POA: Diagnosis not present

## 2016-06-12 DIAGNOSIS — R0602 Shortness of breath: Secondary | ICD-10-CM | POA: Insufficient documentation

## 2016-06-12 DIAGNOSIS — F17200 Nicotine dependence, unspecified, uncomplicated: Secondary | ICD-10-CM | POA: Diagnosis not present

## 2016-06-12 DIAGNOSIS — I2510 Atherosclerotic heart disease of native coronary artery without angina pectoris: Secondary | ICD-10-CM | POA: Insufficient documentation

## 2016-06-12 DIAGNOSIS — M549 Dorsalgia, unspecified: Secondary | ICD-10-CM | POA: Diagnosis not present

## 2016-06-12 DIAGNOSIS — Z7982 Long term (current) use of aspirin: Secondary | ICD-10-CM | POA: Insufficient documentation

## 2016-06-12 DIAGNOSIS — E7800 Pure hypercholesterolemia, unspecified: Secondary | ICD-10-CM | POA: Diagnosis not present

## 2016-06-12 DIAGNOSIS — Z79899 Other long term (current) drug therapy: Secondary | ICD-10-CM | POA: Insufficient documentation

## 2016-06-12 DIAGNOSIS — R200 Anesthesia of skin: Secondary | ICD-10-CM | POA: Diagnosis not present

## 2016-06-12 DIAGNOSIS — Z9889 Other specified postprocedural states: Secondary | ICD-10-CM | POA: Diagnosis not present

## 2016-06-12 DIAGNOSIS — I739 Peripheral vascular disease, unspecified: Secondary | ICD-10-CM | POA: Diagnosis not present

## 2016-06-12 DIAGNOSIS — I10 Essential (primary) hypertension: Secondary | ICD-10-CM | POA: Insufficient documentation

## 2016-06-12 DIAGNOSIS — E784 Other hyperlipidemia: Secondary | ICD-10-CM | POA: Diagnosis not present

## 2016-06-12 DIAGNOSIS — R079 Chest pain, unspecified: Secondary | ICD-10-CM | POA: Diagnosis not present

## 2016-06-12 DIAGNOSIS — M545 Low back pain: Secondary | ICD-10-CM | POA: Diagnosis not present

## 2016-06-12 HISTORY — DX: Essential (primary) hypertension: I10

## 2016-06-12 LAB — BASIC METABOLIC PANEL
ANION GAP: 9 (ref 5–15)
BUN: 18 mg/dL (ref 6–20)
CALCIUM: 9.6 mg/dL (ref 8.9–10.3)
CO2: 25 mmol/L (ref 22–32)
Chloride: 104 mmol/L (ref 101–111)
Creatinine, Ser: 0.96 mg/dL (ref 0.61–1.24)
GFR calc Af Amer: >60 mL/min (ref >60)
GFR calc non Af Amer: >60 mL/min (ref >60)
GLUCOSE: 112 mg/dL — AB (ref 65–99)
POTASSIUM: 3.6 mmol/L (ref 3.5–5.1)
Sodium: 138 mmol/L (ref 135–145)

## 2016-06-12 LAB — CBC
HEMATOCRIT: 49 % (ref 40.0–52.0)
HEMOGLOBIN: 16.7 g/dL (ref 13.0–18.0)
MCH: 32.6 pg (ref 26.0–34.0)
MCHC: 34.1 g/dL (ref 32.0–36.0)
MCV: 95.5 fL (ref 80.0–100.0)
Platelets: 205 10*3/uL (ref 150–440)
RBC: 5.13 MIL/uL (ref 4.40–5.90)
RDW: 14.8 % — AB (ref 11.5–14.5)
WBC: 9.2 10*3/uL (ref 3.8–10.6)

## 2016-06-12 LAB — TROPONIN I: Troponin I: <0.03 ng/mL (ref <0.03)

## 2016-06-12 MED ORDER — METOPROLOL TARTRATE 25 MG PO TABS
50.0000 mg | ORAL_TABLET | Freq: Two times a day (BID) | ORAL | 0 refills | Status: DC
Start: 2016-06-12 — End: 2016-06-12

## 2016-06-12 MED ORDER — IOPAMIDOL (ISOVUE-370) INJECTION 76%
100.0000 mL | Freq: Once | INTRAVENOUS | Status: AC | PRN
  Administered 2016-06-12: 100 mL via INTRAVENOUS

## 2016-06-12 MED ORDER — NITROGLYCERIN 2 % TD OINT
1.0000 [in_us] | TOPICAL_OINTMENT | Freq: Four times a day (QID) | TRANSDERMAL | Status: DC
  Administered 2016-06-12: 1 [in_us] via TOPICAL
  Filled 2016-06-12: qty 1

## 2016-06-12 MED ORDER — METOPROLOL TARTRATE 25 MG PO TABS: ORAL_TABLET | ORAL | 0 refills | Status: DC

## 2016-06-12 NOTE — Discharge Instructions (Signed)
Please increase your metoprolol to 50 mg by mouth twice daily.  Please make an appointment to follow up with Dr. Saralyn Pilar.  Please return to the emergency department for chest pain, shortness of breath, palpitations, lightheadedness or fainting, fever, or any other symptoms concerning to you.

## 2016-06-12 NOTE — ED Notes (Signed)
Nitroglycerin paste removed

## 2016-06-12 NOTE — ED Provider Notes (Addendum)
[facility] Emergency Department Provider Note  ____________________________________________  Time seen: Approximately 3:53 PM  I have reviewed the triage vital signs and the nursing notes.   HISTORY  Chief Complaint Chest Pain    HPI Aaron [name] is a 60 y.o. male with a history of CAD, familial hyperlipidemia, on going tobacco abuse, chronic angina, presenting from Dr. Saralyn Pilar' office for chest pain.The patient reports that he has a history of chronic angina, which has significantly improved over the past year. Since Sunday, he reports a constant "sharp" pain between the shoulder blades, under the left shoulder blade, radiating into the left upper extremity with associated left upper extremity numbness. He also has a "dull ache" in the center of the chest. This pain is constant with the exception of one hour of pain relief if he takes sublingual nitroglycerin. He has occasional unrelated shortness of breath. This pain is not related to exertion, food, or position. He was seen at his cardiologist's office today, who recommended emergency department evaluation to draw a troponin and repeat an EKG.   Past Medical History:  Diagnosis Date  . Hypertension     Patient Active Problem List   Diagnosis Date Noted  . Elevated blood sugar 07/27/2015  . Controlled gout 12/22/2014  . CAD in native artery 10/09/2014  . Essential (primary) hypertension 10/09/2014  . Familial multiple lipoprotein-type hyperlipidemia 10/09/2014  . Dupuytren's disease of palm 10/09/2014  . Special screening for malignant neoplasm of prostate 10/09/2014  . Psoriasis 10/09/2014  . Pain in shoulder 10/09/2014  . Compulsive tobacco user syndrome 10/09/2014  . H/O cardiac catheterization 01/27/2014  . Peripheral blood vessel disorder ([place]) 01/27/2014    Past Surgical History:  Procedure Laterality Date  . APPENDECTOMY    . cardiac stents   2012    Current Outpatient Rx  . Order  #: [id number] Class: Historical Med  . Order #: [id number] Class: Historical Med  . Order #: [id number] Class: Historical Med  . Order #: [id number] Class: Historical Med  . Order #: [id number] Class: Historical Med  . Order #: [id number] Class: Historical Med  . Order #: [id number] Class: Normal  . Order #: [id number] Class: Normal  . Order #: [id number] Class: Normal  . Order #: [id number] Class: Normal  . Order #: [id number] Class: Normal  . Order #: [id number] Class: Normal  . Order #: [id number] Class: Print    Allergies Patient has no known allergies.  Family History  Problem Relation Age of Onset  . Hypertension Mother   . Diabetes Mother   . Heart failure Father     age 69  . Cancer Father     Social History Social History  Substance Use Topics  . Smoking status: Current Every Day Smoker    Packs/day: 0.50    Years: 40.00    Types: Cigarettes  . Smokeless tobacco: Never Used     Comment: patient not ready  . Alcohol use 8.4 oz/week    14 Standard drinks or equivalent per week     Comment: occasional    Review of Systems Constitutional: No fever/chills.No lightheadedness or syncope. Eyes: No visual changes. ENT: No sore throat. No congestion or rhinorrhea. Cardiovascular: Positive chest pain. Denies palpitations. Respiratory: Positive shortness of breath.  No cough. Gastrointestinal: No abdominal pain.  No nausea, no vomiting.  No diarrhea.  No constipation. Genitourinary: Negative for dysuria. Musculoskeletal: Negative for back pain. Positive back pain, pain under the left shoulder blade, left upper extremity pain with numbness. Skin: Negative for rash. Neurological: Negative for headaches. No  focal , tingling or weakness. Positive left upper extremity numbness.  10-point ROS otherwise negative.  ____________________________________________   PHYSICAL EXAM:  VITAL SIGNS: ED Triage Vitals  Enc Vitals Group     BP 06/12/16 1504 131/70     Pulse Rate 06/12/16 1504 79      Resp 06/12/16 1504 18     Temp 06/12/16 1504 98.4 F (36.9 C)     Temp Source 06/12/16 1504 Oral     SpO2 06/12/16 1504 98 %     Weight 06/12/16 1504 186 lb (84.4 kg)     Height 06/12/16 1504 6\' 1"  (1.854 m)     Head Circumference --      Peak Flow --      Pain Score 06/12/16 1505 8     Pain Loc --      Pain Edu? --      Excl. in [place]? --     Constitutional: Alert and oriented. Well appearing and in no acute distress. Answers questions appropriately. Eyes: Conjunctivae are normal.  EOMI. No scleral icterus. Head: Atraumatic. Nose: No congestion/rhinnorhea. Mouth/Throat: Mucous membranes are moist.  Neck: No stridor.  Supple.  No JVD. Cardiovascular: Normal rate, regular rhythm. No murmurs, rubs or gallops.  Respiratory: Normal respiratory effort.  No accessory muscle use or retractions. Rales in the right lung base that do not clear with cough. No wheezes or rhonchi.  Gastrointestinal: Soft, nontender and nondistended.  No guarding or rebound.  No peritoneal signs. Musculoskeletal: No LE edema. No ttp in the calves or palpable cords.  Negative Homan's sign. Neurologic:  A&Ox3.  Speech is clear.  Face and smile are symmetric.  EOMI.  Moves all extremities well. Skin:  Skin is warm, dry and intact. No rash noted. Psychiatric: Mood and affect are normal. Speech and behavior are normal.  Normal judgement  ____________________________________________   LABS (all labs ordered are listed, but only abnormal results are displayed)  Labs Reviewed  BASIC METABOLIC PANEL - Abnormal; Notable for the following:       Result Value   Glucose, Bld 112 (*)    All other components within normal limits  CBC - Abnormal; Notable for the following:    RDW 14.8 (*)    All other components within normal limits  TROPONIN I   ____________________________________________  EKG  ED ECG REPORT I, Eula Listen, the attending physician, personally viewed and interpreted this ECG.   Date:  06/12/2016  EKG Time: 1457  Rate: 79  Rhythm: normal sinus rhythm  Axis: Normal  Intervals:none  ST&T Change: Nonspecific T-wave inversions in V1. No ST elevation.  ____________________________________________  RADIOLOGY  Dg Chest 2 View  Result Date: 06/12/2016 CLINICAL DATA:  Chest pain for the past 2 days responsive to nitroglycerin. History of coronary artery disease with stent placement. Current smoker. EXAM: CHEST  2 VIEW COMPARISON:  Portable chest x-ray of July 02, 2014 FINDINGS: The lungs are mildly hyperinflated. There is no focal infiltrate. There is no pleural effusion. The heart and pulmonary vascularity are normal. The mediastinum is normal in width. There is calcification in the wall of the aortic arch. There is mild degenerative disc disease with anterior bridging osteophyte formation in the mid and lower thoracic spine. IMPRESSION: COPD -smoking related changes. No pneumonia, CHF, nor other acute cardiopulmonary abnormality. Thoracic aortic atherosclerosis. Electronically Signed   By: David  Martinique M.D.   On: 06/12/2016 15:47   Ct Angio Chest Aorta W And/or Wo Contrast  Result Date: 06/12/2016  CLINICAL DATA:  Chest pain. EXAM: CT ANGIOGRAPHY CHEST WITH CONTRAST TECHNIQUE: Multidetector CT imaging of the chest was performed using the standard protocol during bolus administration of intravenous contrast. Multiplanar CT image reconstructions and MIPs were obtained to evaluate the vascular anatomy. CONTRAST:  100 mL of Isovue 370 intravenously. COMPARISON:  Radiographs of same day. FINDINGS: Cardiovascular: Atherosclerosis of thoracic aorta is noted without aneurysm or dissection. Mild coronary artery calcifications are noted. Mediastinum/Nodes: No enlarged mediastinal, hilar, or axillary lymph nodes. Thyroid gland, trachea, and esophagus demonstrate no significant findings. Lungs/Pleura: Lungs are clear. No pleural effusion or pneumothorax. Upper Abdomen: No acute abnormality.  Musculoskeletal: No chest wall abnormality. No acute or significant osseous findings. Review of the MIP images confirms the above findings. IMPRESSION: Aortic atherosclerosis. Mild coronary artery calcifications are noted suggesting coronary artery disease. No other abnormality seen in the chest. Electronically Signed   By: Marijo Conception, M.D.   On: 06/12/2016 16:27    ____________________________________________   PROCEDURES  Procedure(s) performed: None  Procedures  Critical Care performed: No ____________________________________________   INITIAL IMPRESSION / ASSESSMENT AND PLAN / ED COURSE  Pertinent labs & imaging results that were available during my care of the patient were reviewed by me and considered in my medical decision making (see chart for details).  60 y.o. male with a history of CAD, chronic angina, presenting with a constant chest pain, back pain, and left upper extremity pain with numbness since Sunday. Overall, this patient has reassuring vital signs. His cardiopulmonary exam is reassuring with the exception of Rales in the right lung base which are mild and likely due to his chronic smoking rather than an acute pulmonary edema or infection. The patient has not had any cough or cold symptoms, fever or chills. He does not have any DVT, tachycardia, hypoxia which would be concerning for PE. However, given that there is a component of back pain and the patient's general angina is in the chest and left upper extremity, I will get a CT angiogram to rule out aortic pathology. At this time, the patient has a reassuring EKG, negative troponin, and if his CT is negative, I'll plan to discharge him home with follow-up with [facility].  ----------------------------------------- 4:55 PM on 06/12/2016 -----------------------------------------  The patient CT angiogram is reassuring, and does not show any aortic pathology. I've spoken with the patient's primary cardiologist, Dr.  Saralyn Pilar, who recommends discharge with close follow-up and he will see the patient in the office. He recommends increasing the patient's metoprolol to 50 mg twice daily from 25 mg twice daily. The patient understands these medication changes, his follow-up instructions, and return precautions.  ----------------------------------------- 5:09 PM on 06/12/2016 -----------------------------------------  The patient reports that he artery takes 50 mg of metoprolol twice daily. His blood pressure here is within normal limits, so I do not want to double his dose and take the risk of symptomatic hypotension or syncope. I will plan to leave his morning dose the same, and increase his evening dose from 50 mg to 75 mg. The patient understands this new change.  ____________________________________________  FINAL CLINICAL IMPRESSION(S) / ED DIAGNOSES  Final diagnoses:  Chest pain, unspecified type  Shortness of breath  Acute midline back pain, unspecified back location  Left arm pain  Left arm numbness    Clinical Course       NEW MEDICATIONS STARTED DURING THIS VISIT:  Current Discharge Medication List        Eula Listen, MD 06/12/16 1655  Eula Listen, MD 06/12/16 1710

## 2016-06-12 NOTE — ED Notes (Signed)
Pts sts that he was sent by PCP for CP. Sts that CP has been ongoing since Sunday, L chest stabbing CP that radiates down L arm and to back.  Pt sts that he has taken nitrostat SL w/ relief, last took today at 1230.

## 2016-06-12 NOTE — ED Notes (Signed)
Patient transported to X-ray 

## 2016-06-12 NOTE — ED Triage Notes (Signed)
Patient presents to the ED with intermittent chest pain since Sunday.  Patient reports his cardiologist instructing him to go to the ED.  Patient is in no obvious distress at this time.  Patient reports nitro relieves the pain but then the pain returns when the nitro wears off.  Patient reports nausea with the pain and that it radiates down his left arm.  Patient reports history of cardiac stents.

## 2016-06-12 NOTE — ED Notes (Signed)
ED Provider at bedside. 

## 2016-06-12 NOTE — ED Notes (Signed)
Pt. returned from XR. 

## 2016-06-12 NOTE — ED Notes (Signed)
Patient transported to CT 

## 2016-06-25 ENCOUNTER — Other Ambulatory Visit (INDEPENDENT_AMBULATORY_CARE_PROVIDER_SITE_OTHER): Payer: Self-pay | Admitting: Vascular Surgery

## 2016-06-25 DIAGNOSIS — I739 Peripheral vascular disease, unspecified: Secondary | ICD-10-CM

## 2016-06-25 DIAGNOSIS — M79662 Pain in left lower leg: Secondary | ICD-10-CM

## 2016-06-25 DIAGNOSIS — M79661 Pain in right lower leg: Secondary | ICD-10-CM

## 2016-06-27 DIAGNOSIS — I2510 Atherosclerotic heart disease of native coronary artery without angina pectoris: Secondary | ICD-10-CM | POA: Diagnosis not present

## 2016-06-27 DIAGNOSIS — I739 Peripheral vascular disease, unspecified: Secondary | ICD-10-CM | POA: Diagnosis not present

## 2016-06-27 DIAGNOSIS — I25110 Atherosclerotic heart disease of native coronary artery with unstable angina pectoris: Secondary | ICD-10-CM | POA: Diagnosis not present

## 2016-06-27 DIAGNOSIS — R079 Chest pain, unspecified: Secondary | ICD-10-CM | POA: Diagnosis not present

## 2016-06-27 DIAGNOSIS — E7800 Pure hypercholesterolemia, unspecified: Secondary | ICD-10-CM | POA: Diagnosis not present

## 2016-06-27 DIAGNOSIS — F17200 Nicotine dependence, unspecified, uncomplicated: Secondary | ICD-10-CM | POA: Diagnosis not present

## 2016-06-27 DIAGNOSIS — I10 Essential (primary) hypertension: Secondary | ICD-10-CM | POA: Diagnosis not present

## 2016-06-28 ENCOUNTER — Encounter: Payer: Self-pay | Admitting: Internal Medicine

## 2016-06-29 ENCOUNTER — Encounter (INDEPENDENT_AMBULATORY_CARE_PROVIDER_SITE_OTHER): Payer: Self-pay

## 2016-06-29 ENCOUNTER — Ambulatory Visit (INDEPENDENT_AMBULATORY_CARE_PROVIDER_SITE_OTHER): Payer: Self-pay | Admitting: Vascular Surgery

## 2016-06-29 ENCOUNTER — Encounter (INDEPENDENT_AMBULATORY_CARE_PROVIDER_SITE_OTHER): Payer: PPO

## 2016-06-29 NOTE — Progress Notes (Signed)
This encounter was created in error - please disregard.

## 2016-07-11 ENCOUNTER — Telehealth (INDEPENDENT_AMBULATORY_CARE_PROVIDER_SITE_OTHER): Payer: Self-pay | Admitting: Vascular Surgery

## 2016-07-11 NOTE — Telephone Encounter (Signed)
Patient called and said he is experiencing stab-like pain R leg right above calf every 5-10 mins for about 2 days now. He is scheduled for ABI and LE ART in February, but feels should get in sooner.

## 2016-07-11 NOTE — Telephone Encounter (Signed)
If there is space for him to come in sooner please have an ABI attached to his appointment.

## 2016-07-12 ENCOUNTER — Encounter (INDEPENDENT_AMBULATORY_CARE_PROVIDER_SITE_OTHER): Payer: PPO

## 2016-07-12 ENCOUNTER — Other Ambulatory Visit (INDEPENDENT_AMBULATORY_CARE_PROVIDER_SITE_OTHER): Payer: Self-pay | Admitting: Vascular Surgery

## 2016-07-12 DIAGNOSIS — M79661 Pain in right lower leg: Secondary | ICD-10-CM | POA: Diagnosis not present

## 2016-07-12 DIAGNOSIS — M79662 Pain in left lower leg: Secondary | ICD-10-CM

## 2016-07-12 DIAGNOSIS — I739 Peripheral vascular disease, unspecified: Secondary | ICD-10-CM | POA: Diagnosis not present

## 2016-07-12 DIAGNOSIS — M79606 Pain in leg, unspecified: Secondary | ICD-10-CM

## 2016-07-13 ENCOUNTER — Encounter (INDEPENDENT_AMBULATORY_CARE_PROVIDER_SITE_OTHER): Payer: Self-pay | Admitting: Vascular Surgery

## 2016-07-13 ENCOUNTER — Ambulatory Visit (INDEPENDENT_AMBULATORY_CARE_PROVIDER_SITE_OTHER): Payer: PPO | Admitting: Vascular Surgery

## 2016-07-13 VITALS — BP 125/71 | HR 76 | Resp 16 | Ht 73.0 in | Wt 184.0 lb

## 2016-07-13 DIAGNOSIS — M79604 Pain in right leg: Secondary | ICD-10-CM | POA: Diagnosis not present

## 2016-07-13 DIAGNOSIS — I10 Essential (primary) hypertension: Secondary | ICD-10-CM | POA: Diagnosis not present

## 2016-07-13 DIAGNOSIS — F17200 Nicotine dependence, unspecified, uncomplicated: Secondary | ICD-10-CM | POA: Diagnosis not present

## 2016-07-13 DIAGNOSIS — M79609 Pain in unspecified limb: Secondary | ICD-10-CM | POA: Insufficient documentation

## 2016-07-13 DIAGNOSIS — I739 Peripheral vascular disease, unspecified: Secondary | ICD-10-CM

## 2016-07-13 DIAGNOSIS — E782 Mixed hyperlipidemia: Secondary | ICD-10-CM

## 2016-07-13 DIAGNOSIS — I70219 Atherosclerosis of native arteries of extremities with intermittent claudication, unspecified extremity: Secondary | ICD-10-CM | POA: Insufficient documentation

## 2016-07-13 HISTORY — DX: Pain in unspecified limb: M79.609

## 2016-07-13 NOTE — Assessment & Plan Note (Signed)
We had a discussion for approximately 4-5 minutes regarding the absolute need for smoking cessation due to the deleterious nature of tobacco on the vascular system. We discussed the tobacco use would diminish patency of any intervention, and likely significantly worsen progressio of disease. We discussed multiple agents for quitting including replacement therapy or medications to reduce cravings such as Chantix. The patient voices their understanding of the importance of smoking cessation. He has agreed to let me write him a prescription for Chantix today and plans to start taking this.

## 2016-07-13 NOTE — Assessment & Plan Note (Signed)
Given his ABIs, not from a vascular source.  Likely from neuropathic or arthritis pain

## 2016-07-13 NOTE — Assessment & Plan Note (Signed)
lipid control important in reducing the progression of atherosclerotic disease. Continue statin therapy  

## 2016-07-13 NOTE — Assessment & Plan Note (Signed)
blood pressure control important in reducing the progression of atherosclerotic disease. On appropriate oral medications.  

## 2016-07-13 NOTE — Progress Notes (Signed)
MRN : [id number]  CHING RABIDEAU is a 61 y.o. (Mar 06, 1956) male who presents with chief complaint of  Chief Complaint  Patient presents with  . Re-evaluation    Ultrasound follow up  .  History of Present Illness: Patient returns today in follow up of Leg pain. He presents prior to scheduled visit due to worsening leg pain in the right calf and knee area. This is not necessarily related to activity. He continues to smoke and has been very concerned about this. He has an upcoming stress test due to some abnormalities and a recent cardiology visit and says he wants to stop smoking. ABIs today were normal at 1.0 on the right and 1.1 on the left with good wave forms.  Current Outpatient Prescriptions  Medication Sig Dispense Refill  . aspirin 325 MG EC tablet Take 1 tablet by mouth daily.    . cilostazol (PLETAL) 50 MG tablet Take 1 tablet by mouth 2 (two) times daily.     . clopidogrel (PLAVIX) 75 MG tablet Take 1 tablet by mouth daily.    . fexofenadine-pseudoephedrine (ALLEGRA-D) 60-120 MG 12 hr tablet Take 1 tablet by mouth 2 (two) times daily. 60 tablet 0  . fluticasone (FLONASE) 50 MCG/ACT nasal spray Place 2 sprays into both nostrils daily. 16 g 1  . indomethacin (INDOCIN) 25 MG capsule Take 1 capsule (25 mg total) by mouth 2 (two) times daily as needed. 30 capsule 5  . isosorbide mononitrate (IMDUR) 30 MG 24 hr tablet Take 3 tablets by mouth daily.    [name] lisinopril (PRINIVIL,ZESTRIL) 10 MG tablet Take 1 tablet by mouth daily.    . meclizine (ANTIVERT) 25 MG tablet Take 1 tablet (25 mg total) by mouth 3 (three) times daily as needed for dizziness. 30 tablet 0  . metoprolol tartrate (LOPRESSOR) 25 MG tablet Take 2 tablets by mouth daily. And 3 tablets at bedtime    . nitroGLYCERIN (NITROSTAT) 0.4 MG SL tablet Place 1 tablet under the tongue as needed.    . simvastatin (ZOCOR) 40 MG tablet Take 1 tablet (40 mg total) by mouth at bedtime. 30 tablet 5  . triamcinolone cream (KENALOG) 0.5  % Apply 1 application topically 2 (two) times daily. 30 g 5   No current facility-administered medications for this visit.     Past Medical History:  Diagnosis Date  . Hypertension     Past Surgical History:  Procedure Laterality Date  . APPENDECTOMY    . cardiac stents   2012    Social History Social History  Substance Use Topics  . Smoking status: Current Every Day Smoker    Packs/day: 0.50    Years: 40.00    Types: Cigarettes  . Smokeless tobacco: Never Used     Comment: patient not ready  . Alcohol use 8.4 oz/week    14 Standard drinks or equivalent per week     Comment: occasional      Family History Family History  Problem Relation Age of Onset  . Hypertension Mother   . Diabetes Mother   . Heart failure Father     age 1  . Cancer Father     No Known Allergies   REVIEW OF SYSTEMS (Negative unless checked)  Constitutional: '[]'$ Weight loss  '[]'$ Fever  '[]'$ Chills Cardiac: '[]'$ Chest pain   '[]'$ Chest pressure   '[]'$ Palpitations   '[]'$ Shortness of breath when laying flat   '[]'$ Shortness of breath at rest   '[]'$ Shortness of breath with exertion. Vascular:  '[x]'$ Pain  in legs with walking   '[x]'$ Pain in legs at rest   '[]'$ Pain in legs when laying flat   '[]'$ Claudication   '[]'$ Pain in feet when walking  '[]'$ Pain in feet at rest  '[]'$ Pain in feet when laying flat   '[]'$ History of DVT   '[]'$ Phlebitis   '[]'$ Swelling in legs   '[]'$ Varicose veins   '[]'$ Non-healing ulcers Pulmonary:   '[]'$ Uses home oxygen   '[]'$ Productive cough   '[]'$ Hemoptysis   '[]'$ Wheeze  '[]'$ COPD   '[]'$ Asthma Neurologic:  '[]'$ Dizziness  '[]'$ Blackouts   '[]'$ Seizures   '[]'$ History of stroke   '[]'$ History of TIA  '[]'$ Aphasia   '[]'$ Temporary blindness   '[]'$ Dysphagia   '[]'$ Weakness or numbness in arms   '[]'$ Weakness or numbness in legs Musculoskeletal:  '[]'$ Arthritis   '[]'$ Joint swelling   '[]'$ Joint pain   '[]'$ Low back pain Hematologic:  '[]'$ Easy bruising  '[]'$ Easy bleeding   '[]'$ Hypercoagulable state   '[]'$ Anemic   Gastrointestinal:  '[]'$ Blood in stool   '[]'$ Vomiting blood  '[]'$ Gastroesophageal  reflux/heartburn   '[]'$ Abdominal pain Genitourinary:  '[]'$ Chronic kidney disease   '[]'$ Difficult urination  '[]'$ Frequent urination  '[]'$ Burning with urination   '[]'$ Hematuria Skin:  '[]'$ Rashes   '[]'$ Ulcers   '[]'$ Wounds Psychological:  '[]'$ History of anxiety   '[]'$  History of major depression.  Physical Examination  BP 125/71 (BP Location: Right Arm)   Pulse 76   Resp 16   Ht '6\' 1"'$  (1.854 m)   Wt 184 lb (83.5 kg)   BMI 24.28 kg/m  Gen:  WD/WN, NAD Head: /AT, No temporalis wasting. Ear/Nose/Throat: Hearing grossly intact, nares w/o erythema or drainage, trachea midline. Dentition is poor Eyes: Conjunctiva clear. Sclera non-icteric Neck: Supple.  No JVD.  Pulmonary:  Good air movement, no use of accessory muscles.  Cardiac: RRR, normal S1, S2 Vascular:  Vessel Right Left  Radial Palpable Palpable  Ulnar Palpable Palpable  Brachial Palpable Palpable  Carotid Palpable, without bruit Palpable, without bruit  Aorta Not palpable N/A  Femoral Palpable Palpable  Popliteal Palpable Palpable  PT Palpable Palpable  DP Palpable Palpable   Gastrointestinal: soft, non-tender/non-distended. No guarding/reflex.  Musculoskeletal: M/S 5/5 throughout.  No deformity or atrophy.  Neurologic: Sensation grossly intact in extremities.  Symmetrical.  Speech is fluent.  Psychiatric: Judgment intact, Mood & affect appropriate for pt's clinical situation. Dermatologic: No rashes or ulcers noted.  No cellulitis or open wounds. Lymph : No Cervical, Axillary, or Inguinal lymphadenopathy.      Labs Recent Results (from the past 2160 hour(s))  Basic metabolic panel     Status: Abnormal   Collection Time: 06/12/16  3:07 PM  Result Value Ref Range   Sodium 138 135 - 145 mmol/L   Potassium 3.6 3.5 - 5.1 mmol/L   Chloride 104 101 - 111 mmol/L   CO2 25 22 - 32 mmol/L   Glucose, Bld 112 (H) 65 - 99 mg/dL   BUN 18 6 - 20 mg/dL   Creatinine, Ser 0.96 0.61 - 1.24 mg/dL   Calcium 9.6 8.9 - 10.3 mg/dL   GFR calc non Af Amer  >60 >60 mL/min   GFR calc Af Amer >60 >60 mL/min    Comment: (NOTE) The eGFR has been calculated using the CKD EPI equation. This calculation has not been validated in all clinical situations. eGFR's persistently <60 mL/min signify possible Chronic Kidney Disease.    Anion gap 9 5 - 15  CBC     Status: Abnormal   Collection Time: 06/12/16  3:07 PM  Result Value Ref Range   WBC  9.2 3.8 - 10.6 K/uL   RBC 5.13 4.40 - 5.90 MIL/uL   Hemoglobin 16.7 13.0 - 18.0 g/dL   HCT 49.0 40.0 - 52.0 %   MCV 95.5 80.0 - 100.0 fL   MCH 32.6 26.0 - 34.0 pg   MCHC 34.1 32.0 - 36.0 g/dL   RDW 14.8 (H) 11.5 - 14.5 %   Platelets 205 150 - 440 K/uL  Troponin I     Status: None   Collection Time: 06/12/16  3:07 PM  Result Value Ref Range   Troponin I <0.03 <0.03 ng/mL    Radiology No results found.    Assessment/Plan  Essential (primary) hypertension blood pressure control important in reducing the progression of atherosclerotic disease. On appropriate oral medications.   HLD (hyperlipidemia) lipid control important in reducing the progression of atherosclerotic disease. Continue statin therapy   Pain in limb Given his ABIs, not from a vascular source.  Likely from neuropathic or arthritis pain  Tobacco use disorder We had a discussion for approximately 4-5 minutes regarding the absolute need for smoking cessation due to the deleterious nature of tobacco on the vascular system. We discussed the tobacco use would diminish patency of any intervention, and likely significantly worsen progressio of disease. We discussed multiple agents for quitting including replacement therapy or medications to reduce cravings such as Chantix. The patient voices their understanding of the importance of smoking cessation. He has agreed to let me write him a prescription for Chantix today and plans to start taking this.     Peripheral vascular disease (HCC) ABIs today were normal at 1.0 on the right and 1.1 on the  left with good wave forms. Discussed multiple atherosclerotic risk factors he has present and recommended smoking cessation. Plan to recheck him in 2-3 years with noninvasive studies.    [name], MD  07/13/2016 10:15 AM    This note was created with Dragon medical transcription system.  Any errors from dictation are purely unintentional

## 2016-07-13 NOTE — Assessment & Plan Note (Signed)
ABIs today were normal at 1.0 on the right and 1.1 on the left with good wave forms. Discussed multiple atherosclerotic risk factors he has present and recommended smoking cessation. Plan to recheck him in 2-3 years with noninvasive studies.

## 2016-07-19 ENCOUNTER — Ambulatory Visit: Payer: Self-pay | Admitting: Internal Medicine

## 2016-08-22 ENCOUNTER — Encounter: Payer: Self-pay | Admitting: Internal Medicine

## 2016-09-04 ENCOUNTER — Ambulatory Visit (INDEPENDENT_AMBULATORY_CARE_PROVIDER_SITE_OTHER): Payer: PPO | Admitting: Vascular Surgery

## 2016-09-04 ENCOUNTER — Encounter (INDEPENDENT_AMBULATORY_CARE_PROVIDER_SITE_OTHER): Payer: Self-pay

## 2016-09-04 ENCOUNTER — Encounter (INDEPENDENT_AMBULATORY_CARE_PROVIDER_SITE_OTHER): Payer: PPO

## 2016-09-24 ENCOUNTER — Telehealth: Payer: Self-pay

## 2016-09-24 NOTE — Telephone Encounter (Signed)
Pt requesting refill on Triamcinolone cream.

## 2016-09-24 NOTE — Telephone Encounter (Signed)
No, not seen in more than one year.

## 2016-09-24 NOTE — Telephone Encounter (Signed)
Requesting refill on Triamcinolone cream

## 2016-09-25 NOTE — Telephone Encounter (Signed)
Informed pt- sent to front to make appt.

## 2016-09-27 ENCOUNTER — Ambulatory Visit: Payer: PPO | Admitting: Internal Medicine

## 2016-10-02 DIAGNOSIS — F17200 Nicotine dependence, unspecified, uncomplicated: Secondary | ICD-10-CM | POA: Diagnosis not present

## 2016-10-02 DIAGNOSIS — E785 Hyperlipidemia, unspecified: Secondary | ICD-10-CM | POA: Diagnosis not present

## 2016-10-02 DIAGNOSIS — I739 Peripheral vascular disease, unspecified: Secondary | ICD-10-CM | POA: Diagnosis not present

## 2016-10-02 DIAGNOSIS — Z9889 Other specified postprocedural states: Secondary | ICD-10-CM | POA: Diagnosis not present

## 2016-10-02 DIAGNOSIS — I2510 Atherosclerotic heart disease of native coronary artery without angina pectoris: Secondary | ICD-10-CM | POA: Diagnosis not present

## 2016-10-02 DIAGNOSIS — I10 Essential (primary) hypertension: Secondary | ICD-10-CM | POA: Diagnosis not present

## 2017-01-11 DIAGNOSIS — R079 Chest pain, unspecified: Secondary | ICD-10-CM | POA: Diagnosis not present

## 2017-01-12 ENCOUNTER — Encounter: Payer: Self-pay | Admitting: *Deleted

## 2017-01-12 ENCOUNTER — Observation Stay
Admission: EM | Admit: 2017-01-12 | Discharge: 2017-01-12 | Disposition: A | Discharge status: Home / Self Care | Payer: PPO | Attending: Internal Medicine | Admitting: Internal Medicine

## 2017-01-12 ENCOUNTER — Emergency Department: Payer: PPO

## 2017-01-12 ENCOUNTER — Other Ambulatory Visit: Payer: PPO

## 2017-01-12 ENCOUNTER — Observation Stay
Admit: 2017-01-12 | Discharge: 2017-01-12 | Disposition: A | Discharge status: Home / Self Care | Payer: PPO | Attending: Internal Medicine | Admitting: Internal Medicine

## 2017-01-12 ENCOUNTER — Observation Stay: Payer: PPO

## 2017-01-12 DIAGNOSIS — Z7982 Long term (current) use of aspirin: Secondary | ICD-10-CM | POA: Insufficient documentation

## 2017-01-12 DIAGNOSIS — I200 Unstable angina: Secondary | ICD-10-CM | POA: Diagnosis not present

## 2017-01-12 DIAGNOSIS — I25110 Atherosclerotic heart disease of native coronary artery with unstable angina pectoris: Principal | ICD-10-CM | POA: Insufficient documentation

## 2017-01-12 DIAGNOSIS — I10 Essential (primary) hypertension: Secondary | ICD-10-CM | POA: Diagnosis not present

## 2017-01-12 DIAGNOSIS — E785 Hyperlipidemia, unspecified: Secondary | ICD-10-CM | POA: Diagnosis not present

## 2017-01-12 DIAGNOSIS — F17210 Nicotine dependence, cigarettes, uncomplicated: Secondary | ICD-10-CM | POA: Diagnosis not present

## 2017-01-12 DIAGNOSIS — Z79899 Other long term (current) drug therapy: Secondary | ICD-10-CM | POA: Diagnosis not present

## 2017-01-12 DIAGNOSIS — Z7902 Long term (current) use of antithrombotics/antiplatelets: Secondary | ICD-10-CM | POA: Insufficient documentation

## 2017-01-12 DIAGNOSIS — R200 Anesthesia of skin: Secondary | ICD-10-CM | POA: Diagnosis not present

## 2017-01-12 DIAGNOSIS — I25719 Atherosclerosis of autologous vein coronary artery bypass graft(s) with unspecified angina pectoris: Secondary | ICD-10-CM | POA: Diagnosis not present

## 2017-01-12 DIAGNOSIS — Z8249 Family history of ischemic heart disease and other diseases of the circulatory system: Secondary | ICD-10-CM | POA: Diagnosis not present

## 2017-01-12 DIAGNOSIS — Z955 Presence of coronary angioplasty implant and graft: Secondary | ICD-10-CM | POA: Diagnosis not present

## 2017-01-12 DIAGNOSIS — R079 Chest pain, unspecified: Secondary | ICD-10-CM

## 2017-01-12 DIAGNOSIS — M109 Gout, unspecified: Secondary | ICD-10-CM | POA: Diagnosis not present

## 2017-01-12 DIAGNOSIS — I739 Peripheral vascular disease, unspecified: Secondary | ICD-10-CM | POA: Insufficient documentation

## 2017-01-12 DIAGNOSIS — E7800 Pure hypercholesterolemia, unspecified: Secondary | ICD-10-CM | POA: Diagnosis not present

## 2017-01-12 DIAGNOSIS — I2510 Atherosclerotic heart disease of native coronary artery without angina pectoris: Secondary | ICD-10-CM | POA: Diagnosis not present

## 2017-01-12 HISTORY — DX: Pure hypercholesterolemia, unspecified: E78.00

## 2017-01-12 HISTORY — DX: Unstable angina: I20.0

## 2017-01-12 HISTORY — DX: Atherosclerotic heart disease of native coronary artery without angina pectoris: I25.10

## 2017-01-12 HISTORY — DX: Presence of coronary angioplasty implant and graft: Z95.5

## 2017-01-12 HISTORY — DX: Gout, unspecified: M10.9

## 2017-01-12 LAB — CBC WITH DIFFERENTIAL/PLATELET
BASOS ABS: 0.1 10*3/uL (ref 0–0.1)
BASOS PCT: 1 %
Eosinophils Absolute: 0.2 10*3/uL (ref 0–0.7)
Eosinophils Relative: 2 %
HEMATOCRIT: 52.4 % — AB (ref 40.0–52.0)
HEMOGLOBIN: 17.6 g/dL (ref 13.0–18.0)
LYMPHS PCT: 31 %
Lymphs Abs: 2.9 10*3/uL (ref 1.0–3.6)
MCH: 32.7 pg (ref 26.0–34.0)
MCHC: 33.5 g/dL (ref 32.0–36.0)
MCV: 97.6 fL (ref 80.0–100.0)
MONO ABS: 0.6 10*3/uL (ref 0.2–1.0)
Monocytes Relative: 7 %
NEUTROS ABS: 5.6 10*3/uL (ref 1.4–6.5)
NEUTROS PCT: 59 %
Platelets: 193 10*3/uL (ref 150–440)
RBC: 5.37 MIL/uL (ref 4.40–5.90)
RDW: 14.2 % (ref 11.5–14.5)
WBC: 9.5 10*3/uL (ref 3.8–10.6)

## 2017-01-12 LAB — NM MYOCAR MULTI W/SPECT W/WALL MOTION / EF
CHL CUP NUCLEAR SDS: 0
CHL CUP NUCLEAR SRS: 0
Exercise duration (min): 1 min
Exercise duration (sec): 3 s
LV dias vol: 78 mL (ref 62–150)
LV sys vol: 21 mL
NUC STRESS TID: 1
Peak HR: 90 {beats}/min
Percent HR: 159 %
Rest HR: 58 {beats}/min
SSS: 1

## 2017-01-12 LAB — LIPID PANEL
CHOL/HDL RATIO: 5.2 ratio
Cholesterol: 166 mg/dL (ref 0–200)
HDL: 32 mg/dL — AB (ref >40)
LDL CALC: 110 mg/dL — AB (ref 0–99)
Triglycerides: 118 mg/dL (ref <150)
VLDL: 24 mg/dL (ref 0–40)

## 2017-01-12 LAB — COMPREHENSIVE METABOLIC PANEL
ALBUMIN: 4.1 g/dL (ref 3.5–5.0)
ALK PHOS: 69 U/L (ref 38–126)
ALT: 30 U/L (ref 17–63)
AST: 25 U/L (ref 15–41)
Anion gap: 8 (ref 5–15)
BILIRUBIN TOTAL: 0.7 mg/dL (ref 0.3–1.2)
BUN: 21 mg/dL — AB (ref 6–20)
CALCIUM: 9.1 mg/dL (ref 8.9–10.3)
CO2: 24 mmol/L (ref 22–32)
Chloride: 103 mmol/L (ref 101–111)
Creatinine, Ser: 0.98 mg/dL (ref 0.61–1.24)
GFR calc Af Amer: >60 mL/min (ref >60)
GFR calc non Af Amer: >60 mL/min (ref >60)
GLUCOSE: 112 mg/dL — AB (ref 65–99)
Potassium: 4.2 mmol/L (ref 3.5–5.1)
SODIUM: 135 mmol/L (ref 135–145)
TOTAL PROTEIN: 7.2 g/dL (ref 6.5–8.1)

## 2017-01-12 LAB — CBC
HEMATOCRIT: 54.3 % — AB (ref 40.0–52.0)
HEMOGLOBIN: 18.6 g/dL — AB (ref 13.0–18.0)
MCH: 33 pg (ref 26.0–34.0)
MCHC: 34.2 g/dL (ref 32.0–36.0)
MCV: 96.6 fL (ref 80.0–100.0)
Platelets: 226 10*3/uL (ref 150–440)
RBC: 5.62 MIL/uL (ref 4.40–5.90)
RDW: 14 % (ref 11.5–14.5)
WBC: 8.9 10*3/uL (ref 3.8–10.6)

## 2017-01-12 LAB — BASIC METABOLIC PANEL
ANION GAP: 9 (ref 5–15)
BUN: 19 mg/dL (ref 6–20)
CALCIUM: 9.4 mg/dL (ref 8.9–10.3)
CO2: 28 mmol/L (ref 22–32)
Chloride: 100 mmol/L — ABNORMAL LOW (ref 101–111)
Creatinine, Ser: 0.98 mg/dL (ref 0.61–1.24)
GFR calc non Af Amer: >60 mL/min (ref >60)
Glucose, Bld: 108 mg/dL — ABNORMAL HIGH (ref 65–99)
POTASSIUM: 4.4 mmol/L (ref 3.5–5.1)
Sodium: 137 mmol/L (ref 135–145)

## 2017-01-12 LAB — TROPONIN I: Troponin I: <0.03 ng/mL (ref <0.03)

## 2017-01-12 MED ORDER — ISOSORBIDE MONONITRATE ER 60 MG PO TB24
60.0000 mg | ORAL_TABLET | Freq: Every day | ORAL | Status: DC
  Administered 2017-01-12: 60 mg via ORAL
  Filled 2017-01-12: qty 1

## 2017-01-12 MED ORDER — SIMVASTATIN 20 MG PO TABS: 40.0000 mg | ORAL_TABLET | Freq: Every day | ORAL | Status: DC

## 2017-01-12 MED ORDER — MECLIZINE HCL 25 MG PO TABS
25.0000 mg | ORAL_TABLET | Freq: Three times a day (TID) | ORAL | Status: DC | PRN
  Filled 2017-01-12: qty 1

## 2017-01-12 MED ORDER — MORPHINE SULFATE (PF) 4 MG/ML IV SOLN
4.0000 mg | Freq: Once | INTRAVENOUS | Status: AC
  Administered 2017-01-12: 4 mg via INTRAVENOUS
  Filled 2017-01-12: qty 1

## 2017-01-12 MED ORDER — MORPHINE SULFATE (PF) 2 MG/ML IV SOLN
2.0000 mg | INTRAVENOUS | Status: DC | PRN
  Administered 2017-01-12: 2 mg via INTRAVENOUS
  Filled 2017-01-12: qty 1

## 2017-01-12 MED ORDER — NITROGLYCERIN 0.4 MG SL SUBL: 0.4000 mg | SUBLINGUAL_TABLET | SUBLINGUAL | Status: DC | PRN

## 2017-01-12 MED ORDER — ONDANSETRON HCL 4 MG/2ML IJ SOLN: 4.0000 mg | Freq: Four times a day (QID) | INTRAMUSCULAR | Status: DC | PRN

## 2017-01-12 MED ORDER — RANOLAZINE ER 500 MG PO TB12: 500.0000 mg | ORAL_TABLET | Freq: Two times a day (BID) | ORAL | 2 refills | Status: DC

## 2017-01-12 MED ORDER — ENOXAPARIN SODIUM 40 MG/0.4ML ~~LOC~~ SOLN
40.0000 mg | SUBCUTANEOUS | Status: DC
  Administered 2017-01-12: 40 mg via SUBCUTANEOUS
  Filled 2017-01-12: qty 0.4

## 2017-01-12 MED ORDER — CILOSTAZOL 100 MG PO TABS
50.0000 mg | ORAL_TABLET | Freq: Two times a day (BID) | ORAL | Status: DC
  Administered 2017-01-12: 50 mg via ORAL
  Filled 2017-01-12: qty 0.5
  Filled 2017-01-12: qty 1

## 2017-01-12 MED ORDER — ASPIRIN EC 325 MG PO TBEC
325.0000 mg | DELAYED_RELEASE_TABLET | Freq: Every day | ORAL | Status: DC
  Administered 2017-01-12: 325 mg via ORAL
  Filled 2017-01-12: qty 1

## 2017-01-12 MED ORDER — RANOLAZINE ER 500 MG PO TB12
500.0000 mg | ORAL_TABLET | Freq: Two times a day (BID) | ORAL | Status: DC
  Administered 2017-01-12: 500 mg via ORAL
  Filled 2017-01-12 (×2): qty 1

## 2017-01-12 MED ORDER — ASPIRIN 81 MG PO CHEW
324.0000 mg | CHEWABLE_TABLET | ORAL | Status: AC
  Administered 2017-01-12: 324 mg via ORAL
  Filled 2017-01-12: qty 4

## 2017-01-12 MED ORDER — METOPROLOL TARTRATE 50 MG PO TABS
50.0000 mg | ORAL_TABLET | Freq: Two times a day (BID) | ORAL | Status: DC
  Administered 2017-01-12: 50 mg via ORAL
  Filled 2017-01-12: qty 1

## 2017-01-12 MED ORDER — ACETAMINOPHEN 325 MG PO TABS: 650.0000 mg | ORAL_TABLET | ORAL | Status: DC | PRN

## 2017-01-12 MED ORDER — TECHNETIUM TC 99M TETROFOSMIN IV KIT
13.0000 | PACK | Freq: Once | INTRAVENOUS | Status: AC | PRN
  Administered 2017-01-12: 13.05 via INTRAVENOUS

## 2017-01-12 MED ORDER — CLOPIDOGREL BISULFATE 75 MG PO TABS
75.0000 mg | ORAL_TABLET | Freq: Every day | ORAL | Status: DC
  Administered 2017-01-12: 75 mg via ORAL
  Filled 2017-01-12: qty 1

## 2017-01-12 MED ORDER — ENOXAPARIN SODIUM 40 MG/0.4ML ~~LOC~~ SOLN: 40.0000 mg | SUBCUTANEOUS | Status: DC

## 2017-01-12 MED ORDER — LISINOPRIL 10 MG PO TABS
10.0000 mg | ORAL_TABLET | Freq: Every day | ORAL | Status: DC
  Administered 2017-01-12: 10 mg via ORAL
  Filled 2017-01-12: qty 1

## 2017-01-12 MED ORDER — ASPIRIN 300 MG RE SUPP: 300.0000 mg | RECTAL | Status: AC

## 2017-01-12 MED ORDER — ONDANSETRON HCL 4 MG/2ML IJ SOLN
4.0000 mg | Freq: Once | INTRAMUSCULAR | Status: AC
  Administered 2017-01-12: 4 mg via INTRAVENOUS
  Filled 2017-01-12: qty 2

## 2017-01-12 NOTE — ED Notes (Signed)
Lab redraw green tube.  Sent to lab

## 2017-01-12 NOTE — H&P (Addendum)
[facility] NAME: Aaron [name]    MR#:  [id number]  DATE OF BIRTH:  May 31, 1956  DATE OF ADMISSION:  01/12/2017  PRIMARY CARE PHYSICIAN: Glean Hess, MD   REQUESTING/REFERRING PHYSICIAN:   CHIEF COMPLAINT:   Chief Complaint  Patient presents with  . Chest Pain    HISTORY OF PRESENT ILLNESS: Aaron [name]  is a 61 y.o. male with a known history of coronary artery disease, gout, hyperlipidemia, hypertension, cardiac stent presented to the emergency room with chest pain. The chest pain woke him up from sleep yesterday morning around 5:30 AM. The chest pain was located in the left chest and radiated to left arm. Was sharp in nature 7 out of 10 on a scale of 1-10. Patient took 3 nitroglycerin tablets and one full dose aspirin prior to arrival to emergency room. First set of troponin was negative EKG normal sinus rhythm with no ST segment changes. No complaints of any shortness of breath, orthopnea. The chest pain was not relieved with the nitroglycerin and patient was given IV morphine in the emergency room which helped it. Hospitalist service was consulted for further care of the patient.  PAST MEDICAL HISTORY:   Past Medical History:  Diagnosis Date  . Coronary artery disease   . Gout   . Hypercholesteremia   . Hypertension     PAST SURGICAL HISTORY: Past Surgical History:  Procedure Laterality Date  . APPENDECTOMY    . cardiac stents   2012    SOCIAL HISTORY:  Social History  Substance Use Topics  . Smoking status: Current Every Day Smoker    Packs/day: 0.50    Years: 40.00    Types: Cigarettes  . Smokeless tobacco: Never Used     Comment: patient not ready  . Alcohol use 8.4 oz/week    14 Standard drinks or equivalent per week     Comment: occasional    FAMILY HISTORY:  Family History  Problem Relation Age of Onset  . Hypertension Mother   . Diabetes Mother   . Heart failure Father        age 60   . Cancer Father     DRUG ALLERGIES: No Known Allergies  REVIEW OF SYSTEMS:   CONSTITUTIONAL: No fever, fatigue or weakness.  EYES: No blurred or double vision.  EARS, NOSE, AND THROAT: No tinnitus or ear pain.  RESPIRATORY: No cough, shortness of breath, wheezing or hemoptysis.  CARDIOVASCULAR: Has chest pain,  No orthopnea, edema.  GASTROINTESTINAL: No nausea, vomiting, diarrhea or abdominal pain.  GENITOURINARY: No dysuria, hematuria.  ENDOCRINE: No polyuria, nocturia,  HEMATOLOGY: No anemia, easy bruising or bleeding SKIN: No rash or lesion. MUSCULOSKELETAL: No joint pain or arthritis.   NEUROLOGIC: No tingling, numbness, weakness.  PSYCHIATRY: No anxiety or depression.   MEDICATIONS AT HOME:  Prior to Admission medications   Medication Sig Start Date End Date Taking? Authorizing Provider  aspirin 325 MG EC tablet Take 1 tablet by mouth daily.   Yes [provider]  cilostazol (PLETAL) 50 MG tablet Take 1 tablet by mouth 2 (two) times daily.    Yes [provider]  clopidogrel (PLAVIX) 75 MG tablet Take 1 tablet by mouth daily.   Yes [provider]  fexofenadine-pseudoephedrine (ALLEGRA-D) 60-120 MG 12 hr tablet Take 1 tablet by mouth 2 (two) times daily. 07/27/15  Yes Glean Hess, MD  fluticasone Asencion Islam) 50 MCG/ACT nasal spray Place 2 sprays into both  nostrils daily. 07/27/15  Yes Glean Hess, MD  indomethacin (INDOCIN) 25 MG capsule Take 1 capsule (25 mg total) by mouth 2 (two) times daily as needed. 05/11/16  Yes Glean Hess, MD  isosorbide mononitrate (IMDUR) 60 MG 24 hr tablet Take 60 mg by mouth daily. 09/04/16  Yes [provider]  lisinopril (PRINIVIL,ZESTRIL) 10 MG tablet Take 1 tablet by mouth daily.   Yes [provider]  meclizine (ANTIVERT) 25 MG tablet Take 1 tablet (25 mg total) by mouth 3 (three) times daily as needed for dizziness. 07/27/15  Yes Glean Hess, MD  metoprolol (LOPRESSOR) 50 MG  tablet Take 50 mg by mouth 2 (two) times daily. 09/05/16  Yes [provider]  nitroGLYCERIN (NITROSTAT) 0.4 MG SL tablet Place 1 tablet under the tongue as needed.   Yes [provider]  simvastatin (ZOCOR) 40 MG tablet Take 1 tablet (40 mg total) by mouth at bedtime. 05/21/15  Yes Glean Hess, MD  triamcinolone cream (KENALOG) 0.5 % Apply 1 application topically 2 (two) times daily. 07/27/15  Yes Glean Hess, MD      PHYSICAL EXAMINATION:   VITAL SIGNS: Blood pressure 115/73, pulse 63, temperature 98.4 F (36.9 C), temperature source Oral, resp. rate 17, height 6\' 1"  (1.854 m), weight 86.2 kg (190 lb), SpO2 99 %.  GENERAL:  61 y.o.-year-old patient lying in the bed with no acute distress.  EYES: Pupils equal, round, reactive to light and accommodation. No scleral icterus. Extraocular muscles intact.  HEENT: Head atraumatic, normocephalic. Oropharynx and nasopharynx clear.  NECK:  Supple, no jugular venous distention. No thyroid enlargement, no tenderness.  LUNGS: Normal breath sounds bilaterally, no wheezing, rales,rhonchi or crepitation. No use of accessory muscles of respiration.  CARDIOVASCULAR: S1, S2 normal. No murmurs, rubs, or gallops.  ABDOMEN: Soft, nontender, nondistended. Bowel sounds present. No organomegaly or mass.  EXTREMITIES: No pedal edema, cyanosis, or clubbing.  NEUROLOGIC: Cranial nerves II through XII are intact. Muscle strength 5/5 in all extremities. Sensation intact. Gait not checked.  PSYCHIATRIC: The patient is alert and oriented x 3.  SKIN: No obvious rash, lesion, or ulcer.   LABORATORY PANEL:   CBC  Recent Labs Lab 01/12/17 0056  WBC 9.5  HGB 17.6  HCT 52.4*  PLT 193  MCV 97.6  MCH 32.7  MCHC 33.5  RDW 14.2  LYMPHSABS 2.9  MONOABS 0.6  EOSABS 0.2  BASOSABS 0.1   ------------------------------------------------------------------------------------------------------------------  Chemistries   Recent Labs Lab  01/12/17 0138  NA 135  K 4.2  CL 103  CO2 24  GLUCOSE 112*  BUN 21*  CREATININE 0.98  CALCIUM 9.1  AST 25  ALT 30  ALKPHOS 69  BILITOT 0.7   ------------------------------------------------------------------------------------------------------------------ estimated creatinine clearance is 89.5 mL/min (by C-G formula based on SCr of 0.98 mg/dL). ------------------------------------------------------------------------------------------------------------------ No results for input(s): TSH, T4TOTAL, T3FREE, THYROIDAB in the last 72 hours.  Invalid input(s): FREET3   Coagulation profile No results for input(s): INR, PROTIME in the last 168 hours. ------------------------------------------------------------------------------------------------------------------- No results for input(s): DDIMER in the last 72 hours. -------------------------------------------------------------------------------------------------------------------  Cardiac Enzymes  Recent Labs Lab 01/12/17 0056  TROPONINI <0.03   ------------------------------------------------------------------------------------------------------------------ Invalid input(s): POCBNP  ---------------------------------------------------------------------------------------------------------------  Urinalysis    Component Value Date/Time   COLORURINE Straw 11/06/2012 1302   APPEARANCEUR Clear 11/06/2012 1302   LABSPEC 1.004 11/06/2012 1302   PHURINE 5.0 11/06/2012 1302   GLUCOSEU Negative 11/06/2012 1302   HGBUR 1+ 11/06/2012 1302   BILIRUBINUR Negative 11/06/2012 1302  KETONESUR Negative 11/06/2012 1302   PROTEINUR Negative 11/06/2012 1302   NITRITE Negative 11/06/2012 1302   LEUKOCYTESUR Negative 11/06/2012 1302     RADIOLOGY: Dg Chest Port 1 View  Result Date: 01/12/2017 CLINICAL DATA:  Chest pain.  Left arm numbness. EXAM: PORTABLE CHEST 1 VIEW COMPARISON:  Radiographs and CTA 06/12/2016 FINDINGS: Chronic  hyperinflation. The cardiomediastinal contours are normal. The lungs are clear. Pulmonary vasculature is normal. No consolidation, pleural effusion, or pneumothorax. No acute osseous abnormalities are seen. IMPRESSION: Chronic hyperinflation.  No acute abnormality. Electronically Signed   By: Jeb Levering M.D.   On: 01/12/2017 01:15    EKG: Orders placed or performed during the hospital encounter of 01/12/17  . EKG 12-Lead  . EKG 12-Lead  . EKG 12-Lead  . EKG 12-Lead  . EKG 12-Lead  . EKG 12-Lead    IMPRESSION AND PLAN: 61 year old male patient with history of coronary artery disease, cardiac stent, hypertension, hyperlipidemia presented to the emergency room with chest pain. Admitting diagnosis 1. Unstable angina 2. Coronary artery disease 3. Hypertension 4. Hyperlipidemia Treatment plan Admit patient to telemetry observation bed Cycle troponin and check echocardiogram Cardiac stress test Resume patient on aspirin, Plavix and statin medication and beta blocker Cardiology consultation DVT prophylaxis with subcutaneous Lovenox Pain management with IV morphine and nitrates  All the records are reviewed and case discussed with ED provider. Management plans discussed with the patient, family and they are in agreement.  CODE STATUS:FULL CODE Code Status History    This patient does not have a recorded code status. Please follow your organizational policy for patients in this situation.       TOTAL TIME TAKING CARE OF THIS PATIENT: 50 minutes.    Saundra Shelling M.D on 01/12/2017 at 2:49 AM  Between 7am to 6pm - Pager - [phone]  After 6pm go to [email or web address] - password [email or web address] [facility]  [facility]  Office  [phone]  CC: Primary care physician; Glean Hess, MD

## 2017-01-12 NOTE — Care Management Obs Status (Signed)
MEDICARE OBSERVATION STATUS NOTIFICATION   Patient Details  Name: Aaron [name] MRN: [id number] Date of Birth: 11/02/1955   Medicare Observation Status Notification Given:  No (moon letter)   Discharged within 24 hours of admission.   Anilah Huck A, RN 01/12/2017, 10:05 AM

## 2017-01-12 NOTE — Discharge Instructions (Signed)
Heart healthy diet. °Smoking cessation. °

## 2017-01-12 NOTE — Progress Notes (Signed)
       [facility]  SUBJECTIVE: atypical chest pain   Vitals:   01/12/17 0315 01/12/17 0330 01/12/17 0345 01/12/17 0413  BP: 128/76 126/70 129/72 (!) 143/63  Pulse: 60 (!) 59 (!) 59 63  Resp: 19 16 19 17   Temp:    98 F (36.7 C)  TempSrc:    Oral  SpO2: 98% 97% 98% 99%  Weight:      Height:       No intake or output data in the 24 hours ending 01/12/17 1048  LABS: Basic Metabolic Panel:  Recent Labs  01/12/17 0138 01/12/17 0415  NA 135 137  K 4.2 4.4  CL 103 100*  CO2 24 28  GLUCOSE 112* 108*  BUN 21* 19  CREATININE 0.98 0.98  CALCIUM 9.1 9.4   Liver Function Tests:  Recent Labs  01/12/17 0138  AST 25  ALT 30  ALKPHOS 69  BILITOT 0.7  PROT 7.2  ALBUMIN 4.1   No results for input(s): LIPASE, AMYLASE in the last 72 hours. CBC:  Recent Labs  01/12/17 0056 01/12/17 0415  WBC 9.5 8.9  NEUTROABS 5.6  --   HGB 17.6 18.6*  HCT 52.4* 54.3*  MCV 97.6 96.6  PLT 193 226   Cardiac Enzymes:  Recent Labs  01/12/17 0056 01/12/17 0415  TROPONINI <0.03 <0.03   BNP: Invalid input(s): POCBNP D-Dimer: No results for input(s): DDIMER in the last 72 hours. Hemoglobin A1C: No results for input(s): HGBA1C in the last 72 hours. Fasting Lipid Panel:  Recent Labs  01/12/17 0415  CHOL 166  HDL 32*  LDLCALC 110*  TRIG 118  CHOLHDL 5.2   Thyroid Function Tests: No results for input(s): TSH, T4TOTAL, T3FREE, THYROIDAB in the last 72 hours.  Invalid input(s): FREET3 Anemia Panel: No results for input(s): VITAMINB12, FOLATE, FERRITIN, TIBC, IRON, RETICCTPCT in the last 72 hours.   Physical Exam: Blood pressure (!) 143/63, pulse 63, temperature 98 F (36.7 C), temperature source Oral, resp. rate 17, height 6\' 1"  (1.854 m), weight 86.2 kg (190 lb), SpO2 99 %.   Wt Readings from Last 1 Encounters:  01/12/17 86.2 kg (190 lb)     General appearance: alert and cooperative Resp: clear to auscultation  bilaterally Cardio: regular rate and rhythm GI: soft, non-tender; bowel sounds normal; no masses,  no organomegaly Neurologic: Grossly normal  TELEMETRY: Reviewed telemetry pt in nsr:  ASSESSMENT AND PLAN:  Active Problems:   Unstable angina (HCC)-ruled out for an mi. Functional study normal. Will add ranexa 500 mg bid. OK for discharge. Follow up with Dr. Saralyn Pilar.     Teodoro Spray, MD, [facility] 01/12/2017 10:48 AM

## 2017-01-12 NOTE — ED Triage Notes (Signed)
Pt presents w/ c/o chest pain, woke w/ numbness to L arm. Pt states began having symptomatic chest pain starting at noon today. Pt took nitroglycerin x 3 doses throughout day w/o relief. Pt experienced associated sxs of shortness of brreath, radiating pain to L arm and L jaw, dizziness, lightheadedness, weakness, diaphoresis and nausea. Pt did not vomit. Pt has cardiac hx w/ multiple stents.Pt c/o pain 7/10 presently, denies shortness of breath, c/o pain radiating down left arm presently.

## 2017-01-12 NOTE — Progress Notes (Signed)
Pt. Arrived via stretcher transferred to bed with stand by assist. A&O x4. VSS. Tele applied and verified with CCMD. Skin is intact, warm and dry, verified with Vincente Liberty, Therapist, sports. General room orientation given. Instruction on how to use ascom and call bell sytem given.

## 2017-01-12 NOTE — Consult Note (Signed)
HCPOA/AD materials dropped off with patient.  [place] available to review as needed.  [place] discussed with RN as patient not in room.

## 2017-01-12 NOTE — Consult Note (Signed)
[name]  CARDIOLOGY CONSULT NOTE  Patient ID: Aaron [name] MRN: [id number] DOB/AGE: 61-Feb-1957 61 y.o.  Admit date: 01/12/2017 Referring Physician Dr. Bridgett Larsson Primary Physician Dr. Army Melia Primary Cardiologist Dr. Saralyn Pilar Reason for Consultation chest pain  HPI: patient is a 61 year old male with history of coronary artery disease with history of a stent in his proximal RCA with a 75% ostial small caliber left circumflex by cardiac catheterization 2015. He is currey treated with metoprolol, aspirin and compare to grow, isosorbide mononitrate 90 mg daily, simvastatin 40 mg daily with when necessary nitrates. He is also on cilostazol. Patient was admitted after presenting to the emergency room with complaints of chest left arm pain. He has ruled out for myocardial infarction. Electrocardiogram shows no ischemic changes.the patient described the pain as 7 out of 10.He was given IV morphine which improved the pain.  Review of Systems  Constitutional: Negative.   HENT: Negative.   Eyes: Negative.   Respiratory: Negative.   Cardiovascular: Positive for chest pain.  Gastrointestinal: Negative.   Genitourinary: Negative.   Musculoskeletal: Negative.   Skin: Negative.   Neurological: Negative.   Endo/Heme/Allergies: Negative.   Psychiatric/Behavioral: The patient is nervous/anxious.     Past Medical History:  Diagnosis Date  . Coronary artery disease   . Gout   . Hx of heart artery stent 01/12/2017  . Hypercholesteremia   . Hypertension     Family History  Problem Relation Age of Onset  . Hypertension Mother   . Diabetes Mother   . Heart failure Father        age 77  . Cancer Father     Social History   Social History  . Marital status: Single    Spouse name: N/A  . Number of children: N/A  . Years of education: N/A   Occupational History  . disabled    Social History Main Topics  . Smoking status: Current Every  Day Smoker    Packs/day: 0.50    Years: 40.00    Types: Cigarettes  . Smokeless tobacco: Never Used     Comment: patient not ready  . Alcohol use 8.4 oz/week    14 Standard drinks or equivalent per week     Comment: occasional  . Drug use: No  . Sexual activity: Not on file   Other Topics Concern  . Not on file   Social History Narrative  . No narrative on file    Past Surgical History:  Procedure Laterality Date  . APPENDECTOMY    . cardiac stents   2012     Prescriptions Prior to Admission  Medication Sig Dispense Refill Last Dose  . aspirin 325 MG EC tablet Take 1 tablet by mouth daily.   Taking  . cilostazol (PLETAL) 50 MG tablet Take 1 tablet by mouth 2 (two) times daily.    Taking  . clopidogrel (PLAVIX) 75 MG tablet Take 1 tablet by mouth daily.   Taking  . fexofenadine-pseudoephedrine (ALLEGRA-D) 60-120 MG 12 hr tablet Take 1 tablet by mouth 2 (two) times daily. 60 tablet 0 Taking  . fluticasone (FLONASE) 50 MCG/ACT nasal spray Place 2 sprays into both nostrils daily. 16 g 1 Taking  . indomethacin (INDOCIN) 25 MG capsule Take 1 capsule (25 mg total) by mouth 2 (two) times daily as needed. 30 capsule 5 Taking  . isosorbide mononitrate (IMDUR) 60 MG 24 hr tablet Take 60 mg by mouth daily.     [name]  lisinopril (PRINIVIL,ZESTRIL) 10 MG tablet Take 1 tablet by mouth daily.   Taking  . meclizine (ANTIVERT) 25 MG tablet Take 1 tablet (25 mg total) by mouth 3 (three) times daily as needed for dizziness. 30 tablet 0 Taking  . metoprolol (LOPRESSOR) 50 MG tablet Take 50 mg by mouth 2 (two) times daily.     . nitroGLYCERIN (NITROSTAT) 0.4 MG SL tablet Place 1 tablet under the tongue as needed.   Taking  . simvastatin (ZOCOR) 40 MG tablet Take 1 tablet (40 mg total) by mouth at bedtime. 30 tablet 5 Taking  . triamcinolone cream (KENALOG) 0.5 % Apply 1 application topically 2 (two) times daily. 30 g 5 Taking    Physical Exam: Blood pressure (!) 143/63, pulse 63, temperature 98 F  (36.7 C), temperature source Oral, resp. rate 17, height 6\' 1"  (1.854 m), weight 86.2 kg (190 lb), SpO2 99 %.   Wt Readings from Last 1 Encounters:  01/12/17 86.2 kg (190 lb)     General appearance: alert and cooperative Head: Normocephalic, without obvious abnormality, atraumatic Resp: clear to auscultation bilaterally Chest wall: no tenderness Cardio: regular rate and rhythm GI: soft, non-tender; bowel sounds normal; no masses,  no organomegaly Extremities: extremities normal, atraumatic, no cyanosis or edema Pulses: 2+ and symmetric Neurologic: Grossly normal  Labs:   Lab Results  Component Value Date   WBC 8.9 01/12/2017   HGB 18.6 (H) 01/12/2017   HCT 54.3 (H) 01/12/2017   MCV 96.6 01/12/2017   PLT 226 01/12/2017    Recent Labs Lab 01/12/17 0138 01/12/17 0415  NA 135 137  K 4.2 4.4  CL 103 100*  CO2 24 28  BUN 21* 19  CREATININE 0.98 0.98  CALCIUM 9.1 9.4  PROT 7.2  --   BILITOT 0.7  --   ALKPHOS 69  --   ALT 30  --   AST 25  --   GLUCOSE 112* 108*   Lab Results  Component Value Date   CKTOTAL 55 11/07/2012   CKMB < 0.5 (L) 07/02/2014   TROPONINI <0.03 01/12/2017      Radiology: hest x-ray showed chronic hinflation witrdiopulmonary abnormality EKG: sinus rhythm with no ischemia  ASSESSMENT AND PLAN:  Patient is a 61 year old male with history of coronary disease status post PCI of the RCA in 2013 with a small ostial circumflex lesion was not felt to be ideal for PCI which has been treated medically. He is on beta blockertherapy, long-acting nitrates at 90 mg daily, aspirin and Plavix. He presented with chest pain with both typical and atypical features. He has ruled out for myocardial infarction. Electrocautery gram was normal. Patient is very concerned about his symptoms. He underwent a Lexiscan sestamibi this morning. We'll review the results and make further determination regarding invasive versus medical therapy. Signed: Teodoro Spray MD,  [facility] 01/12/2017, 9:25 AM

## 2017-01-12 NOTE — Discharge Summary (Signed)
[facility] NAME: Aaron [name]    MR#:  [id number]  DATE OF BIRTH:  01-Jul-1956  DATE OF ADMISSION:  01/12/2017   ADMITTING PHYSICIAN: Saundra Shelling, MD  DATE OF DISCHARGE:  01/12/2017 PRIMARY CARE PHYSICIAN: Glean Hess, MD   ADMISSION DIAGNOSIS:  Unstable angina ([place]) [I20.0] Chest pain [R07.9] DISCHARGE DIAGNOSIS:  Active Problems:   Unstable angina ([place])  SECONDARY DIAGNOSIS:   Past Medical History:  Diagnosis Date  . Coronary artery disease   . Gout   . Hx of heart artery stent 01/12/2017  . Hypercholesteremia   . Hypertension    HOSPITAL COURSE:   61 year old male patient with history of coronary artery disease, cardiac stent, hypertension, hyperlipidemia presented to the emergency room with chest pain. Admitting diagnosis 1. Unstable angina, Coronary artery disease. Ruled out MI. Troponin is normal and a stress test is normal Continue aspirin, Plavix, Zocor, add Ranexa per Dr. Ubaldo Glassing. 3. Hypertension: Continue home hypertensive medication. 4. Hyperlipidemia, continue Zocor. Tobacco abuse. Smoking cessation was counseled for 3-4 minutes. The patient will take Chantix. Discussed with Dr. Ubaldo Glassing. DISCHARGE CONDITIONS:  Stable, discharge to home today. CONSULTS OBTAINED:  Treatment Team:  Teodoro Spray, MD DRUG ALLERGIES:  No Known Allergies DISCHARGE MEDICATIONS:   Allergies as of 01/12/2017   No Known Allergies     Medication List    TAKE these medications   aspirin 325 MG EC tablet Take 1 tablet by mouth daily.   cilostazol 50 MG tablet Commonly known as:  PLETAL Take 1 tablet by mouth 2 (two) times daily.   clopidogrel 75 MG tablet Commonly known as:  PLAVIX Take 1 tablet by mouth daily.   fexofenadine-pseudoephedrine 60-120 MG 12 hr tablet Commonly known as:  ALLEGRA-D Take 1 tablet by mouth 2 (two) times daily.   fluticasone 50 MCG/ACT nasal spray Commonly known as:  FLONASE Place  2 sprays into both nostrils daily.   indomethacin 25 MG capsule Commonly known as:  INDOCIN Take 1 capsule (25 mg total) by mouth 2 (two) times daily as needed.   isosorbide mononitrate 60 MG 24 hr tablet Commonly known as:  IMDUR Take 60 mg by mouth daily.   lisinopril 10 MG tablet Commonly known as:  PRINIVIL,ZESTRIL Take 1 tablet by mouth daily.   meclizine 25 MG tablet Commonly known as:  ANTIVERT Take 1 tablet (25 mg total) by mouth 3 (three) times daily as needed for dizziness.   metoprolol tartrate 50 MG tablet Commonly known as:  LOPRESSOR Take 50 mg by mouth 2 (two) times daily.   nitroGLYCERIN 0.4 MG SL tablet Commonly known as:  NITROSTAT Place 1 tablet under the tongue as needed.   ranolazine 500 MG 12 hr tablet Commonly known as:  RANEXA Take 1 tablet (500 mg total) by mouth 2 (two) times daily.   simvastatin 40 MG tablet Commonly known as:  ZOCOR Take 1 tablet (40 mg total) by mouth at bedtime.   triamcinolone cream 0.5 % Commonly known as:  KENALOG Apply 1 application topically 2 (two) times daily.        DISCHARGE INSTRUCTIONS:  See AVS.  If you experience worsening of your admission symptoms, develop shortness of breath, life threatening emergency, suicidal or homicidal thoughts you must seek medical attention immediately by calling 911 or calling your MD immediately  if symptoms less severe.  You Must read complete instructions/literature along with all the possible adverse reactions/side effects for all the  Medicines you take and that have been prescribed to you. Take any new Medicines after you have completely understood and accpet all the possible adverse reactions/side effects.   Please note  You were cared for by a hospitalist during your hospital stay. If you have any questions about your discharge medications or the care you received while you were in the hospital after you are discharged, you can call the unit and asked to speak with the  hospitalist on call if the hospitalist that took care of you is not available. Once you are discharged, your primary care physician will handle any further medical issues. Please note that NO REFILLS for any discharge medications will be authorized once you are discharged, as it is imperative that you return to your primary care physician (or establish a relationship with a primary care physician if you do not have one) for your aftercare needs so that they can reassess your need for medications and monitor your lab values.    On the day of Discharge:  VITAL SIGNS:  Blood pressure (!) 143/63, pulse 63, temperature 98 F (36.7 C), temperature source Oral, resp. rate 17, height 6\' 1"  (1.854 m), weight 190 lb (86.2 kg), SpO2 99 %. PHYSICAL EXAMINATION:  GENERAL:  61 y.o.-year-old patient lying in the bed with no acute distress.  EYES: Pupils equal, round, reactive to light and accommodation. No scleral icterus. Extraocular muscles intact.  HEENT: Head atraumatic, normocephalic. Oropharynx and nasopharynx clear.  NECK:  Supple, no jugular venous distention. No thyroid enlargement, no tenderness.  LUNGS: Normal breath sounds bilaterally, no wheezing, rales,rhonchi or crepitation. No use of accessory muscles of respiration.  CARDIOVASCULAR: S1, S2 normal. No murmurs, rubs, or gallops.  ABDOMEN: Soft, non-tender, non-distended. Bowel sounds present. No organomegaly or mass.  EXTREMITIES: No pedal edema, cyanosis, or clubbing.  NEUROLOGIC: Cranial nerves II through XII are intact. Muscle strength 5/5 in all extremities. Sensation intact. Gait not checked.  PSYCHIATRIC: The patient is alert and oriented x 3.  SKIN: No obvious rash, lesion, or ulcer.  DATA REVIEW:   CBC  Recent Labs Lab 01/12/17 0415  WBC 8.9  HGB 18.6*  HCT 54.3*  PLT 226    Chemistries   Recent Labs Lab 01/12/17 0138 01/12/17 0415  NA 135 137  K 4.2 4.4  CL 103 100*  CO2 24 28  GLUCOSE 112* 108*  BUN 21* 19    CREATININE 0.98 0.98  CALCIUM 9.1 9.4  AST 25  --   ALT 30  --   ALKPHOS 69  --   BILITOT 0.7  --      Microbiology Results  No results found for this or any previous visit.  RADIOLOGY:  Nm Myocar Multi W/spect W/wall Motion / Ef  Result Date: 01/12/2017  Blood pressure demonstrated a normal response to exercise.  The study is normal.  This is a low risk study.  The left ventricular ejection fraction is normal (55-65%).  There was no ST segment deviation noted during stress.  Normal lexiscan stress sestimibi Low risk study No ischemia Normal lv function   Dg Chest Port 1 View  Result Date: 01/12/2017 CLINICAL DATA:  Chest pain.  Left arm numbness. EXAM: PORTABLE CHEST 1 VIEW COMPARISON:  Radiographs and CTA 06/12/2016 FINDINGS: Chronic hyperinflation. The cardiomediastinal contours are normal. The lungs are clear. Pulmonary vasculature is normal. No consolidation, pleural effusion, or pneumothorax. No acute osseous abnormalities are seen. IMPRESSION: Chronic hyperinflation.  No acute abnormality. Electronically Signed   By:  Jeb Levering M.D.   On: 01/12/2017 01:15     Management plans discussed with the patient, family and they are in agreement.  CODE STATUS: Full Code   TOTAL TIME TAKING CARE OF THIS PATIENT: 35 minutes.    Demetrios Loll M.D on 01/12/2017 at 11:25 AM  Between 7am to 6pm - Pager - [phone]  After 6pm go to [email or web address] - Proofreader  [facility]  Office  [phone]  CC: Primary care physician; Glean Hess, MD   Note: This dictation was prepared with Dragon dictation along with smaller phrase technology. Any transcriptional errors that result from this process are unintentional.

## 2017-01-12 NOTE — ED Provider Notes (Signed)
[facility] Emergency Department Provider Note   ____________________________________________   First MD Initiated Contact with Patient 01/12/17 [phone]     (approximate)  I have reviewed the triage vital signs and the nursing notes.   HISTORY  Chief Complaint Chest Pain    HPI Aaron [name] is a 61 y.o. male brought to the ED from home via EMS with a chief complaint of chest pain. Patient has a history of CAD status post stents, on Plavix, who complains of waxing/waning left-sided chest pain since the afternoon. Patient took 3 nitroglycerin throughout the day without relief of symptoms. Called 911 tonight because he experienced radiation to his left arm associated with numbness. Symptoms also associated with shortness of breath, dizziness, weakness, diaphoresis and nausea. Denies recent fever, chills, abdominal pain, dysuria, diarrhea. Denies recent travel or trauma. Patient also took full-strength aspirin this evening prior to arrival.   Past Medical History:  Diagnosis Date  . Coronary artery disease   . Gout   . Hypercholesteremia   . Hypertension     Patient Active Problem List   Diagnosis Date Noted  . Unstable angina ([place]) 01/12/2017  . Pain in limb 07/13/2016  . Tobacco use disorder 07/13/2016  . Elevated blood sugar 07/27/2015  . Controlled gout 12/22/2014  . CAD in native artery 10/09/2014  . Essential (primary) hypertension 10/09/2014  . Dupuytren's disease of palm 10/09/2014  . Psoriasis 10/09/2014  . Pain in shoulder 10/09/2014  . Compulsive tobacco user syndrome 10/09/2014  . H/O cardiac catheterization 01/27/2014  . HLD (hyperlipidemia) 01/27/2014  . Peripheral vascular disease ([place]) 01/27/2014    Past Surgical History:  Procedure Laterality Date  . APPENDECTOMY    . cardiac stents   2012    Prior to Admission medications   Medication Sig Start Date End Date Taking? Authorizing Provider  aspirin 325 MG EC tablet Take 1  tablet by mouth daily.   Yes [provider]  cilostazol (PLETAL) 50 MG tablet Take 1 tablet by mouth 2 (two) times daily.    Yes [provider]  clopidogrel (PLAVIX) 75 MG tablet Take 1 tablet by mouth daily.   Yes [provider]  fexofenadine-pseudoephedrine (ALLEGRA-D) 60-120 MG 12 hr tablet Take 1 tablet by mouth 2 (two) times daily. 07/27/15  Yes Glean Hess, MD  fluticasone [facility]) 50 MCG/ACT nasal spray Place 2 sprays into both nostrils daily. 07/27/15  Yes Glean Hess, MD  indomethacin (INDOCIN) 25 MG capsule Take 1 capsule (25 mg total) by mouth 2 (two) times daily as needed. 05/11/16  Yes Glean Hess, MD  isosorbide mononitrate (IMDUR) 60 MG 24 hr tablet Take 60 mg by mouth daily. 09/04/16  Yes [provider]  lisinopril (PRINIVIL,ZESTRIL) 10 MG tablet Take 1 tablet by mouth daily.   Yes [provider]  meclizine (ANTIVERT) 25 MG tablet Take 1 tablet (25 mg total) by mouth 3 (three) times daily as needed for dizziness. 07/27/15  Yes Glean Hess, MD  metoprolol (LOPRESSOR) 50 MG tablet Take 50 mg by mouth 2 (two) times daily. 09/05/16  Yes [provider]  nitroGLYCERIN (NITROSTAT) 0.4 MG SL tablet Place 1 tablet under the tongue as needed.   Yes [provider]  simvastatin (ZOCOR) 40 MG tablet Take 1 tablet (40 mg total) by mouth at bedtime. 05/21/15  Yes Glean Hess, MD  triamcinolone cream (KENALOG) 0.5 % Apply 1 application topically 2 (two) times daily. 07/27/15  Yes Halina Maidens  H, MD    Allergies Patient has no known allergies.  Family History  Problem Relation Age of Onset  . Hypertension Mother   . Diabetes Mother   . Heart failure Father        age 36  . Cancer Father     Social History Social History  Substance Use Topics  . Smoking status: Current Every Day Smoker    Packs/day: 0.50    Years: 40.00    Types: Cigarettes  . Smokeless tobacco: Never Used     Comment:  patient not ready  . Alcohol use 8.4 oz/week    14 Standard drinks or equivalent per week     Comment: occasional    Review of Systems  Constitutional: No fever/chills. Eyes: No visual changes. ENT: No sore throat. Cardiovascular: Positive for chest pain. Respiratory: Positive for shortness of breath. Gastrointestinal: No abdominal pain.  Positive for nausea, no vomiting.  No diarrhea.  No constipation. Genitourinary: Negative for dysuria. Musculoskeletal: Negative for back pain. Skin: Negative for rash. Neurological: Negative for headaches, focal weakness or numbness.   ____________________________________________   PHYSICAL EXAM:  VITAL SIGNS: ED Triage Vitals  Enc Vitals Group     BP 01/12/17 0043 (!) 157/81     Pulse Rate 01/12/17 0043 62     Resp 01/12/17 0043 13     Temp 01/12/17 0043 98.4 F (36.9 C)     Temp Source 01/12/17 0043 Oral     SpO2 01/12/17 0037 97 %     Weight 01/12/17 0043 190 lb (86.2 kg)     Height 01/12/17 0043 6\' 1"  (1.854 m)     Head Circumference --      Peak Flow --      Pain Score 01/12/17 0041 7     Pain Loc --      Pain Edu? --      Excl. in [place]? --     Constitutional: Alert and oriented. Well appearing and in no acute distress. Eyes: Conjunctivae are normal. PERRL. EOMI. Head: Atraumatic. Nose: No congestion/rhinnorhea. Mouth/Throat: Mucous membranes are moist.  Oropharynx non-erythematous. Neck: No stridor.  No carotid bruits. Cardiovascular: Normal rate, regular rhythm. Grossly normal heart sounds.  Good peripheral circulation. Respiratory: Normal respiratory effort.  No retractions. Lungs CTAB. Gastrointestinal: Soft and nontender. No distention. No abdominal bruits. No CVA tenderness. Musculoskeletal: No lower extremity tenderness nor edema.  No joint effusions. Neurologic:  Normal speech and language. No gross focal neurologic deficits are appreciated. No gait instability. Skin:  Skin is warm, dry and intact. No rash  noted. Psychiatric: Mood and affect are normal. Speech and behavior are normal.  ____________________________________________   LABS (all labs ordered are listed, but only abnormal results are displayed)  Labs Reviewed  CBC WITH DIFFERENTIAL/PLATELET - Abnormal; Notable for the following:       Result Value   HCT 52.4 (*)    All other components within normal limits  COMPREHENSIVE METABOLIC PANEL - Abnormal; Notable for the following:    Glucose, Bld 112 (*)    BUN 21 (*)    All other components within normal limits  TROPONIN I  HIV ANTIBODY (ROUTINE TESTING)  CBC  CREATININE, SERUM  TROPONIN I  TROPONIN I  TROPONIN I  BASIC METABOLIC PANEL  LIPID PANEL   ____________________________________________  EKG  ED ECG REPORT I, Devera Englander J, the attending physician, personally viewed and interpreted this ECG.   Date: 01/12/2017  EKG Time: 0052  Rate: 64  Rhythm:  normal EKG, normal sinus rhythm  Axis: Normal  Intervals:none  ST&T Change: Nonspecific  ____________________________________________  RADIOLOGY  Dg Chest Port 1 View  Result Date: 01/12/2017 CLINICAL DATA:  Chest pain.  Left arm numbness. EXAM: PORTABLE CHEST 1 VIEW COMPARISON:  Radiographs and CTA 06/12/2016 FINDINGS: Chronic hyperinflation. The cardiomediastinal contours are normal. The lungs are clear. Pulmonary vasculature is normal. No consolidation, pleural effusion, or pneumothorax. No acute osseous abnormalities are seen. IMPRESSION: Chronic hyperinflation.  No acute abnormality. Electronically Signed   By: Jeb Levering M.D.   On: 01/12/2017 01:15    ____________________________________________   PROCEDURES  Procedure(s) performed: None  Procedures  Critical Care performed: No  ____________________________________________   INITIAL IMPRESSION / ASSESSMENT AND PLAN / ED COURSE  Pertinent labs & imaging results that were available during my care of the patient were reviewed by me and  considered in my medical decision making (see chart for details).  61 year old male with CAD status post stents, on Plavix who presents with symptoms concerning for unstable angina. Took 3 nitroglycerin throughout the day without relief symptoms. Has taken a full strength aspirin. Will administer morphine for continued chest pain. Initial EKG and troponin are unremarkable. Will discuss with hospitalist to evaluate patient in the emergency department for admission.      ____________________________________________   FINAL CLINICAL IMPRESSION(S) / ED DIAGNOSES  Final diagnoses:  Unstable angina (HCC)  Chest pain, unspecified type      NEW MEDICATIONS STARTED DURING THIS VISIT:  Current Discharge Medication List       Note:  This document was prepared using Dragon voice recognition software and may include unintentional dictation errors.    Paulette Blanch, MD 01/12/17 [phone]

## 2017-01-13 LAB — HIV ANTIBODY (ROUTINE TESTING W REFLEX): HIV Screen 4th Generation wRfx: NONREACTIVE

## 2017-01-13 LAB — ECHOCARDIOGRAM COMPLETE
Height: 73 in
Weight: 3040 oz

## 2017-01-30 DIAGNOSIS — I200 Unstable angina: Secondary | ICD-10-CM | POA: Diagnosis not present

## 2017-01-30 DIAGNOSIS — Z9889 Other specified postprocedural states: Secondary | ICD-10-CM | POA: Diagnosis not present

## 2017-01-30 DIAGNOSIS — I2510 Atherosclerotic heart disease of native coronary artery without angina pectoris: Secondary | ICD-10-CM | POA: Diagnosis not present

## 2017-01-30 DIAGNOSIS — F17200 Nicotine dependence, unspecified, uncomplicated: Secondary | ICD-10-CM | POA: Diagnosis not present

## 2017-01-30 DIAGNOSIS — I10 Essential (primary) hypertension: Secondary | ICD-10-CM | POA: Diagnosis not present

## 2017-01-30 DIAGNOSIS — I739 Peripheral vascular disease, unspecified: Secondary | ICD-10-CM | POA: Diagnosis not present

## 2017-04-02 ENCOUNTER — Ambulatory Visit (INDEPENDENT_AMBULATORY_CARE_PROVIDER_SITE_OTHER): Payer: PPO | Admitting: Internal Medicine

## 2017-04-02 ENCOUNTER — Encounter: Payer: Self-pay | Admitting: Internal Medicine

## 2017-04-02 VITALS — BP 120/60 | HR 75 | Temp 98.2°F | Ht 73.0 in | Wt 187.0 lb

## 2017-04-02 DIAGNOSIS — J0100 Acute maxillary sinusitis, unspecified: Secondary | ICD-10-CM

## 2017-04-02 DIAGNOSIS — Z23 Encounter for immunization: Secondary | ICD-10-CM | POA: Diagnosis not present

## 2017-04-02 DIAGNOSIS — M109 Gout, unspecified: Secondary | ICD-10-CM

## 2017-04-02 MED ORDER — FLUTICASONE PROPIONATE 50 MCG/ACT NA SUSP: 2.0000 | Freq: Every day | NASAL | 1 refills | Status: DC

## 2017-04-02 MED ORDER — INDOMETHACIN 25 MG PO CAPS: 25.0000 mg | ORAL_CAPSULE | Freq: Two times a day (BID) | ORAL | 1 refills | Status: DC | PRN

## 2017-04-02 MED ORDER — AMOXICILLIN-POT CLAVULANATE 875-125 MG PO TABS: 1.0000 | ORAL_TABLET | Freq: Two times a day (BID) | ORAL | 0 refills | Status: AC

## 2017-04-02 NOTE — Progress Notes (Signed)
Date:  04/02/2017   Name:  Aaron [name]   DOB:  1955-09-02   MRN:  [id number]   Chief Complaint: Sinusitis (Started out as a simple toothache and now having right sided face pain and ear pain. SOaked tooth with peroxide and developed this. Smelling something odd when nose is drainage. Yellow production) and Ear Pain (Right ear pain- Feel sharp pain and feel full. ) .Sinus Problem  This is a new problem. The current episode started in the past 7 days. There has been no fever. Associated symptoms include chills, congestion, ear pain and sinus pressure. Pertinent negatives include no coughing, shortness of breath or sore throat. Past treatments include nothing.   Gout - intermittent sx relieved by indocin.  He needs a refill.  Stable Angina - admitted to [facility] with negative troponins and normal ECHO.  Seen in follow up by Dr. Saralyn Pilar.  No medication changes made.  No recurrent chest pains.  Lab Results  Component Value Date   WBC 8.9 01/12/2017   HGB 18.6 (H) 01/12/2017   HCT 54.3 (H) 01/12/2017   MCV 96.6 01/12/2017   PLT 226 01/12/2017   Lab Results  Component Value Date   CREATININE 0.98 01/12/2017     Review of Systems  Constitutional: Positive for chills. Negative for fever and unexpected weight change.  HENT: Positive for congestion, ear pain and sinus pressure. Negative for sore throat, trouble swallowing and voice change.   Eyes: Negative for visual disturbance.  Respiratory: Negative for cough, shortness of breath and wheezing.   Cardiovascular: Negative for chest pain and palpitations.  Gastrointestinal: Negative for diarrhea and vomiting.  Musculoskeletal: Negative for arthralgias.  Psychiatric/Behavioral: Negative for sleep disturbance.    Patient Active Problem List   Diagnosis Date Noted  . Unstable angina ([place]) 01/12/2017  . Pain in limb 07/13/2016  . Tobacco use disorder 07/13/2016  . Elevated blood sugar 07/27/2015  . Controlled gout 12/22/2014  .  CAD in native artery 10/09/2014  . Essential (primary) hypertension 10/09/2014  . Dupuytren's disease of palm 10/09/2014  . Psoriasis 10/09/2014  . Pain in shoulder 10/09/2014  . Compulsive tobacco user syndrome 10/09/2014  . H/O cardiac catheterization 01/27/2014  . HLD (hyperlipidemia) 01/27/2014  . Peripheral vascular disease ([place]) 01/27/2014    Prior to Admission medications   Medication Sig Start Date End Date Taking? Authorizing Provider  aspirin 325 MG EC tablet Take 1 tablet by mouth daily.   Yes [provider]  cilostazol (PLETAL) 50 MG tablet Take 1 tablet by mouth 2 (two) times daily.    Yes [provider]  clopidogrel (PLAVIX) 75 MG tablet Take 1 tablet by mouth daily.   Yes [provider]  fexofenadine-pseudoephedrine (ALLEGRA-D) 60-120 MG 12 hr tablet Take 1 tablet by mouth 2 (two) times daily. 07/27/15  Yes Glean Hess, MD  indomethacin (INDOCIN) 25 MG capsule Take 1 capsule (25 mg total) by mouth 2 (two) times daily as needed. 05/11/16  Yes Glean Hess, MD  isosorbide mononitrate (IMDUR) 60 MG 24 hr tablet Take 60 mg by mouth daily. 09/04/16  Yes [provider]  lisinopril (PRINIVIL,ZESTRIL) 10 MG tablet Take 1 tablet by mouth daily.   Yes [provider]  metoprolol (LOPRESSOR) 50 MG tablet Take 50 mg by mouth daily after breakfast.  09/05/16  Yes [provider]  metoprolol tartrate (LOPRESSOR) 25 MG tablet Take 25 mg by mouth Nightly. 03/07/17  Yes [provider]  nitroGLYCERIN (NITROSTAT) 0.4  MG SL tablet Place 1 tablet under the tongue as needed.   Yes [provider]  simvastatin (ZOCOR) 40 MG tablet Take 1 tablet (40 mg total) by mouth at bedtime. 05/21/15  Yes Glean Hess, MD    No Known Allergies  Past Surgical History:  Procedure Laterality Date  . APPENDECTOMY    . cardiac stents   2012    Social History  Substance Use Topics  . Smoking status: Current Every Day  Smoker    Packs/day: 0.50    Years: 40.00    Types: Cigarettes  . Smokeless tobacco: Never Used     Comment: patient not ready  . Alcohol use 8.4 oz/week    14 Standard drinks or equivalent per week     Comment: occasional     Medication list has been reviewed and updated.  PHQ 2/9 Scores 04/02/2017  PHQ - 2 Score 0    Physical Exam  Constitutional: He is oriented to person, place, and time. He appears well-developed and well-nourished.  HENT:  Right Ear: External ear and ear canal normal. Tympanic membrane is not erythematous and not retracted.  Left Ear: External ear and ear canal normal. Tympanic membrane is not erythematous and not retracted.  Nose: Right sinus exhibits maxillary sinus tenderness and frontal sinus tenderness. Left sinus exhibits maxillary sinus tenderness and frontal sinus tenderness.  Mouth/Throat: Uvula is midline and mucous membranes are normal. No oral lesions. Posterior oropharyngeal erythema present. No oropharyngeal exudate.  Cardiovascular: Normal rate, regular rhythm and normal heart sounds.   Pulmonary/Chest: He has decreased breath sounds. He has no wheezes. He has no rales.  Musculoskeletal: He exhibits no edema.  Lymphadenopathy:    He has no cervical adenopathy.  Neurological: He is alert and oriented to person, place, and time.    BP 120/60 (BP Location: Right Arm, Patient Position: Sitting, Cuff Size: Normal)   Pulse 75   Temp 98.2 F (36.8 C) (Oral)   Ht 6\' 1"  (1.854 m)   Wt 187 lb (84.8 kg)   SpO2 99%   BMI 24.67 kg/m   Assessment and Plan: 1. Acute non-recurrent maxillary sinusitis Resume flonase spary - amoxicillin-clavulanate (AUGMENTIN) 875-125 MG tablet; Take 1 tablet by mouth 2 (two) times daily.  Dispense: 20 tablet; Refill: 0 - fluticasone (FLONASE) 50 MCG/ACT nasal spray; Place 2 sprays into both nostrils daily.  Dispense: 16 g; Refill: 1  2. Controlled gout Continue prn indocin - indomethacin (INDOCIN) 25 MG capsule;  Take 1 capsule (25 mg total) by mouth 2 (two) times daily as needed.  Dispense: 30 capsule; Refill: 1  3. Need for influenza vaccination - Flu Vaccine QUAD 36+ mos IM   Meds ordered this encounter  Medications  . amoxicillin-clavulanate (AUGMENTIN) 875-125 MG tablet    Sig: Take 1 tablet by mouth 2 (two) times daily.    Dispense:  20 tablet    Refill:  0  . fluticasone (FLONASE) 50 MCG/ACT nasal spray    Sig: Place 2 sprays into both nostrils daily.    Dispense:  16 g    Refill:  1  . indomethacin (INDOCIN) 25 MG capsule    Sig: Take 1 capsule (25 mg total) by mouth 2 (two) times daily as needed.    Dispense:  30 capsule    Refill:  1    Partially dictated using Editor, commissioning. Any errors are unintentional.  Halina Maidens, MD [facility]  04/02/2017

## 2017-06-04 DIAGNOSIS — Z9889 Other specified postprocedural states: Secondary | ICD-10-CM | POA: Diagnosis not present

## 2017-06-04 DIAGNOSIS — E785 Hyperlipidemia, unspecified: Secondary | ICD-10-CM | POA: Diagnosis not present

## 2017-06-04 DIAGNOSIS — E7849 Other hyperlipidemia: Secondary | ICD-10-CM | POA: Diagnosis not present

## 2017-06-04 DIAGNOSIS — F17200 Nicotine dependence, unspecified, uncomplicated: Secondary | ICD-10-CM | POA: Diagnosis not present

## 2017-06-04 DIAGNOSIS — I2510 Atherosclerotic heart disease of native coronary artery without angina pectoris: Secondary | ICD-10-CM | POA: Diagnosis not present

## 2017-06-04 DIAGNOSIS — I10 Essential (primary) hypertension: Secondary | ICD-10-CM | POA: Diagnosis not present

## 2017-06-06 ENCOUNTER — Ambulatory Visit: Payer: PPO | Admitting: Internal Medicine

## 2017-06-06 ENCOUNTER — Encounter: Payer: Self-pay | Admitting: Internal Medicine

## 2017-06-06 VITALS — BP 112/64 | HR 64 | Temp 97.4°F | Ht 73.0 in | Wt 189.2 lb

## 2017-06-06 DIAGNOSIS — J40 Bronchitis, not specified as acute or chronic: Secondary | ICD-10-CM

## 2017-06-06 MED ORDER — AZITHROMYCIN 250 MG PO TABS: ORAL_TABLET | ORAL | 0 refills | Status: DC

## 2017-06-06 NOTE — Progress Notes (Signed)
Date:  06/06/2017   Name:  Aaron [name]   DOB:  11-Jan-1956   MRN:  [id number]   Chief Complaint: Cough (Started yesterday- woke up congested. Cough is causing pain in lower back. Yellow production with cough. No fever or chills. )  Cough  This is a new problem. The current episode started yesterday. The problem has been gradually worsening. The problem occurs every few minutes. The cough is productive of sputum. Associated symptoms include chest pain and shortness of breath. Pertinent negatives include no chills, ear pain, fever, headaches, sweats, weight loss or wheezing. The symptoms are aggravated by lying down. He has tried OTC cough suppressant for the symptoms. The treatment provided mild relief.     Review of Systems  Constitutional: Negative for chills, fever and weight loss.  HENT: Negative for congestion, ear pain and trouble swallowing.   Respiratory: Positive for cough and shortness of breath. Negative for chest tightness and wheezing.   Cardiovascular: Positive for chest pain. Negative for palpitations.  Gastrointestinal: Negative for nausea and vomiting.  Neurological: Negative for dizziness and headaches.    Patient Active Problem List   Diagnosis Date Noted  . Unstable angina ([place]) 01/12/2017  . Pain in limb 07/13/2016  . Tobacco use disorder 07/13/2016  . Elevated blood sugar 07/27/2015  . Controlled gout 12/22/2014  . CAD in native artery 10/09/2014  . Essential (primary) hypertension 10/09/2014  . Dupuytren's disease of palm 10/09/2014  . Psoriasis 10/09/2014  . Pain in shoulder 10/09/2014  . Compulsive tobacco user syndrome 10/09/2014  . H/O cardiac catheterization 01/27/2014  . HLD (hyperlipidemia) 01/27/2014  . Peripheral vascular disease ([place]) 01/27/2014    Prior to Admission medications   Medication Sig Start Date End Date Taking? Authorizing Provider  aspirin 325 MG EC tablet Take 1 tablet by mouth daily.   Yes [provider]    cilostazol (PLETAL) 50 MG tablet Take 1 tablet by mouth 2 (two) times daily.    Yes [provider]  clopidogrel (PLAVIX) 75 MG tablet Take 1 tablet by mouth daily.   Yes [provider]  fluticasone (FLONASE) 50 MCG/ACT nasal spray Place 2 sprays into both nostrils daily. 04/02/17  Yes Glean Hess, MD  indomethacin (INDOCIN) 25 MG capsule Take 1 capsule (25 mg total) by mouth 2 (two) times daily as needed. 04/02/17  Yes Glean Hess, MD  isosorbide mononitrate (IMDUR) 60 MG 24 hr tablet Take 60 mg by mouth daily. 09/04/16  Yes [provider]  lisinopril (PRINIVIL,ZESTRIL) 10 MG tablet Take 1 tablet by mouth daily.   Yes [provider]  metoprolol (LOPRESSOR) 50 MG tablet Take 50 mg by mouth daily after breakfast.  09/05/16  Yes [provider]  metoprolol tartrate (LOPRESSOR) 25 MG tablet Take 25 mg by mouth Nightly. 03/07/17  Yes [provider]  nitroGLYCERIN (NITROSTAT) 0.4 MG SL tablet Place 1 tablet under the tongue as needed.   Yes [provider]  simvastatin (ZOCOR) 40 MG tablet Take 1 tablet (40 mg total) by mouth at bedtime. 05/21/15  Yes Glean Hess, MD    No Known Allergies  Past Surgical History:  Procedure Laterality Date  . APPENDECTOMY    . cardiac stents   2012    Social History   Tobacco Use  . Smoking status: Current Every Day Smoker    Packs/day: 0.50    Years: 40.00    Pack years: 20.00    Types: Cigarettes  .  Smokeless tobacco: Never Used  . Tobacco comment: patient not ready  Substance Use Topics  . Alcohol use: Yes    Alcohol/week: 8.4 oz    Types: 14 Standard drinks or equivalent per week    Comment: occasional  . Drug use: No     Medication list has been reviewed and updated.  PHQ 2/9 Scores 04/02/2017  PHQ - 2 Score 0    Physical Exam  Constitutional: He is oriented to person, place, and time. He appears well-developed. No distress.  HENT:  Head: Normocephalic and  atraumatic.  Neck: Neck supple. No thyromegaly present.  Cardiovascular: Normal rate, regular rhythm and normal heart sounds.  Pulmonary/Chest: Effort normal. No respiratory distress. He has decreased breath sounds. He has no wheezes. He has no rhonchi.  Musculoskeletal: Normal range of motion.  Neurological: He is alert and oriented to person, place, and time.  Skin: Skin is warm and dry. No rash noted.  Psychiatric: He has a normal mood and affect. His behavior is normal. Thought content normal.  Nursing note and vitals reviewed.   BP 112/64   Pulse 64   Temp (!) 97.4 F (36.3 C) (Oral)   Ht 6\' 1"  (1.854 m)   Wt 189 lb 3.2 oz (85.8 kg)   SpO2 99%   BMI 24.96 kg/m   Assessment and Plan: 1. Bronchitis Continue Robitussin otc - azithromycin (ZITHROMAX Z-PAK) 250 MG tablet; UAD  Dispense: 6 each; Refill: 0   Meds ordered this encounter  Medications  . azithromycin (ZITHROMAX Z-PAK) 250 MG tablet    Sig: UAD    Dispense:  6 each    Refill:  0    Partially dictated using Editor, commissioning. Any errors are unintentional.  Halina Maidens, MD [facility]  06/06/2017

## 2017-06-07 ENCOUNTER — Other Ambulatory Visit: Payer: Self-pay

## 2017-06-07 ENCOUNTER — Telehealth: Payer: Self-pay

## 2017-06-07 NOTE — Telephone Encounter (Signed)
Pt called in wanting something for cough. Called in [facility] 1 teaspoon q 6 hrs prn cough 4 ounces with 0 refills per Dr Army Melia. Called into [facility]

## 2017-06-28 ENCOUNTER — Ambulatory Visit: Payer: PPO | Admitting: Internal Medicine

## 2017-06-28 ENCOUNTER — Encounter: Payer: Self-pay | Admitting: Internal Medicine

## 2017-06-28 VITALS — BP 122/64 | HR 78 | Ht 73.0 in | Wt 192.0 lb

## 2017-06-28 DIAGNOSIS — R42 Dizziness and giddiness: Secondary | ICD-10-CM

## 2017-06-28 MED ORDER — MECLIZINE HCL 25 MG PO TABS: 25.0000 mg | ORAL_TABLET | Freq: Three times a day (TID) | ORAL | 0 refills | Status: DC | PRN

## 2017-06-28 NOTE — Progress Notes (Signed)
Date:  06/28/2017   Name:  Aaron [name]   DOB:  04/30/1956   MRN:  [id number]   Chief Complaint: Dizziness (When standing, laying down, or bending over he is having dizziness. Walked to the fire station and had bp checked Wednesday and it was normal. )  Dizziness  This is a new problem. The current episode started yesterday. The problem occurs 2 to 4 times per day. The problem has been gradually improving. Associated symptoms include vertigo. Pertinent negatives include no anorexia, chest pain, chills, congestion, coughing, diaphoresis, fever, numbness, rash, sore throat, vomiting or weakness. The symptoms are aggravated by bending and twisting. He has tried nothing for the symptoms.     Review of Systems  Constitutional: Negative for chills, diaphoresis, fever and unexpected weight change.  HENT: Negative for congestion, ear pain, hearing loss and sore throat.   Eyes: Negative for visual disturbance.  Respiratory: Negative for cough, shortness of breath and wheezing.   Cardiovascular: Negative for chest pain and palpitations.  Gastrointestinal: Negative for anorexia and vomiting.  Skin: Negative for rash.  Neurological: Positive for dizziness and vertigo. Negative for weakness and numbness.    Patient Active Problem List   Diagnosis Date Noted  . Unstable angina ([place]) 01/12/2017  . Pain in limb 07/13/2016  . Tobacco use disorder 07/13/2016  . Elevated blood sugar 07/27/2015  . Controlled gout 12/22/2014  . CAD in native artery 10/09/2014  . Essential (primary) hypertension 10/09/2014  . Dupuytren's disease of palm 10/09/2014  . Psoriasis 10/09/2014  . Pain in shoulder 10/09/2014  . Compulsive tobacco user syndrome 10/09/2014  . H/O cardiac catheterization 01/27/2014  . HLD (hyperlipidemia) 01/27/2014  . Peripheral vascular disease ([place]) 01/27/2014    Prior to Admission medications   Medication Sig Start Date End Date Taking? Authorizing Provider  aspirin 325 MG EC  tablet Take 1 tablet by mouth daily.   Yes [provider]  cilostazol (PLETAL) 50 MG tablet Take 1 tablet by mouth 2 (two) times daily.    Yes [provider]  clopidogrel (PLAVIX) 75 MG tablet Take 1 tablet by mouth daily.   Yes [provider]  fluticasone (FLONASE) 50 MCG/ACT nasal spray Place 2 sprays into both nostrils daily. 04/02/17  Yes Glean Hess, MD  indomethacin (INDOCIN) 25 MG capsule Take 1 capsule (25 mg total) by mouth 2 (two) times daily as needed. 04/02/17  Yes Glean Hess, MD  isosorbide mononitrate (IMDUR) 60 MG 24 hr tablet Take 60 mg by mouth daily. 09/04/16  Yes [provider]  lisinopril (PRINIVIL,ZESTRIL) 10 MG tablet Take 1 tablet by mouth daily.   Yes [provider]  metoprolol (LOPRESSOR) 50 MG tablet Take 50 mg by mouth daily after breakfast.  09/05/16  Yes [provider]  metoprolol tartrate (LOPRESSOR) 25 MG tablet Take 25 mg by mouth Nightly. 03/07/17  Yes [provider]  nitroGLYCERIN (NITROSTAT) 0.4 MG SL tablet Place 1 tablet under the tongue as needed.   Yes [provider]  simvastatin (ZOCOR) 40 MG tablet Take 1 tablet (40 mg total) by mouth at bedtime. 05/21/15  Yes Glean Hess, MD    No Known Allergies  Past Surgical History:  Procedure Laterality Date  . APPENDECTOMY    . cardiac stents   2012    Social History   Tobacco Use  . Smoking status: Current Every Day Smoker    Packs/day: 0.50    Years: 40.00    Pack  years: 20.00    Types: Cigarettes  . Smokeless tobacco: Never Used  . Tobacco comment: patient not ready  Substance Use Topics  . Alcohol use: Yes    Alcohol/week: 8.4 oz    Types: 14 Standard drinks or equivalent per week    Comment: occasional  . Drug use: No     Medication list has been reviewed and updated.  PHQ 2/9 Scores 04/02/2017  PHQ - 2 Score 0    Physical Exam  Constitutional: He is oriented to person, place, and time. He  appears well-developed. No distress.  HENT:  Head: Normocephalic and atraumatic.  Right Ear: Tympanic membrane and ear canal normal.  Left Ear: Tympanic membrane and ear canal normal.  Nose: Right sinus exhibits no maxillary sinus tenderness and no frontal sinus tenderness. Left sinus exhibits no maxillary sinus tenderness and no frontal sinus tenderness.  Mouth/Throat: No posterior oropharyngeal erythema.  Eyes: EOM are normal. Pupils are equal, round, and reactive to light.  Neck: Normal range of motion. Neck supple.  Cardiovascular: Normal rate, regular rhythm and normal heart sounds.  Pulmonary/Chest: Effort normal. No respiratory distress. He has no wheezes. He has no rales.  Musculoskeletal: Normal range of motion.  Neurological: He is alert and oriented to person, place, and time. He has normal strength. Coordination normal.  Skin: Skin is warm and dry. No rash noted.  Psychiatric: He has a normal mood and affect. His behavior is normal. Thought content normal.  Nursing note and vitals reviewed.   BP 122/64   Pulse 78   Ht 6\' 1"  (1.854 m)   Wt 192 lb (87.1 kg)   SpO2 96%   BMI 25.33 kg/m   Assessment and Plan: 1. Vertigo Pt reassured - should resolve over the next week Use meclizine if sx become severe - meclizine (ANTIVERT) 25 MG tablet; Take 1 tablet (25 mg total) by mouth 3 (three) times daily as needed for dizziness.  Dispense: 30 tablet; Refill: 0   Meds ordered this encounter  Medications  . meclizine (ANTIVERT) 25 MG tablet    Sig: Take 1 tablet (25 mg total) by mouth 3 (three) times daily as needed for dizziness.    Dispense:  30 tablet    Refill:  0    Partially dictated using Editor, commissioning. Any errors are unintentional.  Halina Maidens, MD [facility]  06/28/2017

## 2017-09-16 ENCOUNTER — Emergency Department: Payer: PPO

## 2017-09-16 ENCOUNTER — Encounter: Payer: Self-pay | Admitting: Emergency Medicine

## 2017-09-16 ENCOUNTER — Observation Stay
Admission: EM | Admit: 2017-09-16 | Discharge: 2017-09-17 | Disposition: A | Discharge status: Home Health | Payer: PPO | Attending: Specialist | Admitting: Specialist

## 2017-09-16 ENCOUNTER — Other Ambulatory Visit: Payer: Self-pay

## 2017-09-16 ENCOUNTER — Observation Stay
Admit: 2017-09-16 | Discharge: 2017-09-16 | Disposition: A | Discharge status: Home / Self Care | Payer: PPO | Attending: Specialist | Admitting: Specialist

## 2017-09-16 ENCOUNTER — Observation Stay: Payer: PPO

## 2017-09-16 DIAGNOSIS — E785 Hyperlipidemia, unspecified: Secondary | ICD-10-CM | POA: Insufficient documentation

## 2017-09-16 DIAGNOSIS — Z7902 Long term (current) use of antithrombotics/antiplatelets: Secondary | ICD-10-CM | POA: Insufficient documentation

## 2017-09-16 DIAGNOSIS — Z8673 Personal history of transient ischemic attack (TIA), and cerebral infarction without residual deficits: Secondary | ICD-10-CM | POA: Insufficient documentation

## 2017-09-16 DIAGNOSIS — N179 Acute kidney failure, unspecified: Secondary | ICD-10-CM | POA: Insufficient documentation

## 2017-09-16 DIAGNOSIS — R55 Syncope and collapse: Secondary | ICD-10-CM

## 2017-09-16 DIAGNOSIS — I739 Peripheral vascular disease, unspecified: Secondary | ICD-10-CM | POA: Diagnosis not present

## 2017-09-16 DIAGNOSIS — M25562 Pain in left knee: Secondary | ICD-10-CM | POA: Diagnosis not present

## 2017-09-16 DIAGNOSIS — S8991XA Unspecified injury of right lower leg, initial encounter: Secondary | ICD-10-CM | POA: Diagnosis not present

## 2017-09-16 DIAGNOSIS — S92102A Unspecified fracture of left talus, initial encounter for closed fracture: Secondary | ICD-10-CM | POA: Diagnosis not present

## 2017-09-16 DIAGNOSIS — R42 Dizziness and giddiness: Secondary | ICD-10-CM | POA: Insufficient documentation

## 2017-09-16 DIAGNOSIS — Z7982 Long term (current) use of aspirin: Secondary | ICD-10-CM | POA: Insufficient documentation

## 2017-09-16 DIAGNOSIS — F10220 Alcohol dependence with intoxication, uncomplicated: Secondary | ICD-10-CM | POA: Diagnosis not present

## 2017-09-16 DIAGNOSIS — I25110 Atherosclerotic heart disease of native coronary artery with unstable angina pectoris: Secondary | ICD-10-CM | POA: Diagnosis not present

## 2017-09-16 DIAGNOSIS — E860 Dehydration: Principal | ICD-10-CM | POA: Insufficient documentation

## 2017-09-16 DIAGNOSIS — M720 Palmar fascial fibromatosis [Dupuytren]: Secondary | ICD-10-CM | POA: Diagnosis not present

## 2017-09-16 DIAGNOSIS — I6523 Occlusion and stenosis of bilateral carotid arteries: Secondary | ICD-10-CM | POA: Diagnosis not present

## 2017-09-16 DIAGNOSIS — M25561 Pain in right knee: Secondary | ICD-10-CM | POA: Diagnosis not present

## 2017-09-16 DIAGNOSIS — F10920 Alcohol use, unspecified with intoxication, uncomplicated: Secondary | ICD-10-CM

## 2017-09-16 DIAGNOSIS — F17210 Nicotine dependence, cigarettes, uncomplicated: Secondary | ICD-10-CM | POA: Insufficient documentation

## 2017-09-16 DIAGNOSIS — W19XXXA Unspecified fall, initial encounter: Secondary | ICD-10-CM | POA: Diagnosis not present

## 2017-09-16 DIAGNOSIS — Y905 Blood alcohol level of 100-119 mg/100 ml: Secondary | ICD-10-CM | POA: Diagnosis not present

## 2017-09-16 DIAGNOSIS — S0990XA Unspecified injury of head, initial encounter: Secondary | ICD-10-CM | POA: Diagnosis not present

## 2017-09-16 DIAGNOSIS — M109 Gout, unspecified: Secondary | ICD-10-CM | POA: Insufficient documentation

## 2017-09-16 DIAGNOSIS — I10 Essential (primary) hypertension: Secondary | ICD-10-CM | POA: Insufficient documentation

## 2017-09-16 DIAGNOSIS — S80911A Unspecified superficial injury of right knee, initial encounter: Secondary | ICD-10-CM | POA: Diagnosis not present

## 2017-09-16 DIAGNOSIS — Z955 Presence of coronary angioplasty implant and graft: Secondary | ICD-10-CM | POA: Insufficient documentation

## 2017-09-16 DIAGNOSIS — S92101A Unspecified fracture of right talus, initial encounter for closed fracture: Secondary | ICD-10-CM | POA: Diagnosis not present

## 2017-09-16 DIAGNOSIS — F10120 Alcohol abuse with intoxication, uncomplicated: Secondary | ICD-10-CM | POA: Diagnosis not present

## 2017-09-16 DIAGNOSIS — S8992XA Unspecified injury of left lower leg, initial encounter: Secondary | ICD-10-CM | POA: Diagnosis not present

## 2017-09-16 DIAGNOSIS — R197 Diarrhea, unspecified: Secondary | ICD-10-CM | POA: Diagnosis not present

## 2017-09-16 DIAGNOSIS — S99912A Unspecified injury of left ankle, initial encounter: Secondary | ICD-10-CM | POA: Diagnosis not present

## 2017-09-16 DIAGNOSIS — M25572 Pain in left ankle and joints of left foot: Secondary | ICD-10-CM | POA: Diagnosis not present

## 2017-09-16 DIAGNOSIS — S199XXA Unspecified injury of neck, initial encounter: Secondary | ICD-10-CM | POA: Diagnosis not present

## 2017-09-16 DIAGNOSIS — S80912A Unspecified superficial injury of left knee, initial encounter: Secondary | ICD-10-CM | POA: Diagnosis not present

## 2017-09-16 DIAGNOSIS — L409 Psoriasis, unspecified: Secondary | ICD-10-CM | POA: Diagnosis not present

## 2017-09-16 DIAGNOSIS — I5189 Other ill-defined heart diseases: Secondary | ICD-10-CM

## 2017-09-16 DIAGNOSIS — S93402A Sprain of unspecified ligament of left ankle, initial encounter: Secondary | ICD-10-CM | POA: Diagnosis not present

## 2017-09-16 DIAGNOSIS — I639 Cerebral infarction, unspecified: Secondary | ICD-10-CM

## 2017-09-16 DIAGNOSIS — Z79899 Other long term (current) drug therapy: Secondary | ICD-10-CM | POA: Insufficient documentation

## 2017-09-16 DIAGNOSIS — M25569 Pain in unspecified knee: Secondary | ICD-10-CM | POA: Diagnosis not present

## 2017-09-16 HISTORY — DX: Other ill-defined heart diseases: I51.89

## 2017-09-16 HISTORY — DX: Syncope and collapse: R55

## 2017-09-16 LAB — URINALYSIS, COMPLETE (UACMP) WITH MICROSCOPIC
BILIRUBIN URINE: NEGATIVE
Glucose, UA: NEGATIVE mg/dL
Ketones, ur: 5 mg/dL — AB
LEUKOCYTES UA: NEGATIVE
NITRITE: NEGATIVE
PH: 5 (ref 5.0–8.0)
Protein, ur: 30 mg/dL — AB
SPECIFIC GRAVITY, URINE: 1.01 (ref 1.005–1.030)

## 2017-09-16 LAB — CBC WITH DIFFERENTIAL/PLATELET
BASOS PCT: 1 %
Basophils Absolute: 0.1 10*3/uL (ref 0–0.1)
Eosinophils Absolute: 0 10*3/uL (ref 0–0.7)
Eosinophils Relative: 0 %
HEMATOCRIT: 54.4 % — AB (ref 40.0–52.0)
Hemoglobin: 17.9 g/dL (ref 13.0–18.0)
Lymphocytes Relative: 17 %
Lymphs Abs: 2 10*3/uL (ref 1.0–3.6)
MCH: 32.1 pg (ref 26.0–34.0)
MCHC: 32.9 g/dL (ref 32.0–36.0)
MCV: 97.5 fL (ref 80.0–100.0)
MONO ABS: 0.5 10*3/uL (ref 0.2–1.0)
MONOS PCT: 4 %
NEUTROS ABS: 9.1 10*3/uL — AB (ref 1.4–6.5)
Neutrophils Relative %: 78 %
Platelets: 243 10*3/uL (ref 150–440)
RBC: 5.59 MIL/uL (ref 4.40–5.90)
RDW: 14.6 % — AB (ref 11.5–14.5)
WBC: 11.7 10*3/uL — ABNORMAL HIGH (ref 3.8–10.6)

## 2017-09-16 LAB — COMPREHENSIVE METABOLIC PANEL
ALBUMIN: 4.7 g/dL (ref 3.5–5.0)
ALT: 36 U/L (ref 17–63)
ANION GAP: 20 — AB (ref 5–15)
AST: 54 U/L — ABNORMAL HIGH (ref 15–41)
Alkaline Phosphatase: 81 U/L (ref 38–126)
BILIRUBIN TOTAL: 1.1 mg/dL (ref 0.3–1.2)
BUN: 19 mg/dL (ref 6–20)
CO2: 18 mmol/L — ABNORMAL LOW (ref 22–32)
Calcium: 8.8 mg/dL — ABNORMAL LOW (ref 8.9–10.3)
Chloride: 96 mmol/L — ABNORMAL LOW (ref 101–111)
Creatinine, Ser: 2.79 mg/dL — ABNORMAL HIGH (ref 0.61–1.24)
GFR calc Af Amer: 27 mL/min — ABNORMAL LOW (ref >60)
GFR, EST NON AFRICAN AMERICAN: 23 mL/min — AB (ref >60)
Glucose, Bld: 84 mg/dL (ref 65–99)
POTASSIUM: 4.5 mmol/L (ref 3.5–5.1)
Sodium: 134 mmol/L — ABNORMAL LOW (ref 135–145)
TOTAL PROTEIN: 8.4 g/dL — AB (ref 6.5–8.1)

## 2017-09-16 LAB — ECHOCARDIOGRAM COMPLETE
Height: 73 in
Weight: 2960 oz

## 2017-09-16 LAB — TROPONIN I
Troponin I: <0.03 ng/mL (ref <0.03)
Troponin I: <0.03 ng/mL (ref <0.03)
Troponin I: <0.03 ng/mL (ref <0.03)

## 2017-09-16 LAB — ETHANOL: Alcohol, Ethyl (B): 114 mg/dL — ABNORMAL HIGH (ref <10)

## 2017-09-16 MED ORDER — SODIUM CHLORIDE 0.9 % IV SOLN
Freq: Once | INTRAVENOUS | Status: AC
  Administered 2017-09-16: 08:00:00 via INTRAVENOUS

## 2017-09-16 MED ORDER — SODIUM CHLORIDE 0.9 % IV SOLN
Freq: Once | INTRAVENOUS | Status: AC
  Administered 2017-09-16: 09:00:00 via INTRAVENOUS

## 2017-09-16 MED ORDER — TRAMADOL HCL 50 MG PO TABS
50.0000 mg | ORAL_TABLET | Freq: Four times a day (QID) | ORAL | Status: DC | PRN
Start: 2017-09-16 — End: 2017-09-17
  Administered 2017-09-16: 50 mg via ORAL
  Filled 2017-09-16: qty 1

## 2017-09-16 MED ORDER — HEPARIN SODIUM (PORCINE) 5000 UNIT/ML IJ SOLN
5000.0000 [IU] | Freq: Three times a day (TID) | INTRAMUSCULAR | Status: DC
  Administered 2017-09-16 – 2017-09-17 (×3): 5000 [IU] via SUBCUTANEOUS
  Filled 2017-09-16 (×3): qty 1

## 2017-09-16 MED ORDER — ACETAMINOPHEN 650 MG RE SUPP: 650.0000 mg | Freq: Four times a day (QID) | RECTAL | Status: DC | PRN

## 2017-09-16 MED ORDER — CLOPIDOGREL BISULFATE 75 MG PO TABS
75.0000 mg | ORAL_TABLET | Freq: Every day | ORAL | Status: DC
  Administered 2017-09-16 – 2017-09-17 (×2): 75 mg via ORAL
  Filled 2017-09-16 (×2): qty 1

## 2017-09-16 MED ORDER — SODIUM CHLORIDE 0.9 % IV SOLN
INTRAVENOUS | Status: DC
  Administered 2017-09-16 (×2): via INTRAVENOUS

## 2017-09-16 MED ORDER — METOPROLOL TARTRATE 50 MG PO TABS
75.0000 mg | ORAL_TABLET | Freq: Every day | ORAL | Status: DC
  Administered 2017-09-16: 75 mg via ORAL
  Filled 2017-09-16: qty 1

## 2017-09-16 MED ORDER — ISOSORBIDE MONONITRATE ER 30 MG PO TB24
90.0000 mg | ORAL_TABLET | Freq: Every day | ORAL | Status: DC
  Administered 2017-09-16 – 2017-09-17 (×2): 90 mg via ORAL
  Filled 2017-09-16 (×2): qty 3

## 2017-09-16 MED ORDER — METOPROLOL TARTRATE 50 MG PO TABS
50.0000 mg | ORAL_TABLET | Freq: Every day | ORAL | Status: DC
  Administered 2017-09-16 – 2017-09-17 (×2): 50 mg via ORAL
  Filled 2017-09-16 (×2): qty 1

## 2017-09-16 MED ORDER — MECLIZINE HCL 25 MG PO TABS
25.0000 mg | ORAL_TABLET | Freq: Three times a day (TID) | ORAL | Status: DC | PRN
  Filled 2017-09-16: qty 1

## 2017-09-16 MED ORDER — CILOSTAZOL 100 MG PO TABS
50.0000 mg | ORAL_TABLET | Freq: Two times a day (BID) | ORAL | Status: DC
  Administered 2017-09-16 – 2017-09-17 (×3): 50 mg via ORAL
  Filled 2017-09-16: qty 1
  Filled 2017-09-16 (×3): qty 0.5
  Filled 2017-09-16: qty 1

## 2017-09-16 MED ORDER — ACETAMINOPHEN 325 MG PO TABS
650.0000 mg | ORAL_TABLET | Freq: Four times a day (QID) | ORAL | Status: DC | PRN
  Administered 2017-09-16: 650 mg via ORAL
  Filled 2017-09-16: qty 2

## 2017-09-16 MED ORDER — ONDANSETRON HCL 4 MG PO TABS: 4.0000 mg | ORAL_TABLET | Freq: Four times a day (QID) | ORAL | Status: DC | PRN

## 2017-09-16 MED ORDER — ASPIRIN EC 325 MG PO TBEC
325.0000 mg | DELAYED_RELEASE_TABLET | Freq: Every day | ORAL | Status: DC
  Administered 2017-09-16 – 2017-09-17 (×2): 325 mg via ORAL
  Filled 2017-09-16 (×2): qty 1

## 2017-09-16 MED ORDER — SIMVASTATIN 20 MG PO TABS
40.0000 mg | ORAL_TABLET | Freq: Every day | ORAL | Status: DC
  Administered 2017-09-16: 40 mg via ORAL
  Filled 2017-09-16: qty 2

## 2017-09-16 MED ORDER — ONDANSETRON HCL 4 MG/2ML IJ SOLN: 4.0000 mg | Freq: Four times a day (QID) | INTRAMUSCULAR | Status: DC | PRN

## 2017-09-16 NOTE — ED Provider Notes (Signed)
[facility] Emergency Department Provider Note       Time seen: ----------------------------------------- 7:46 AM on 09/16/2017 -----------------------------------------   I have reviewed the triage vital signs and the nursing notes.  HISTORY   Chief Complaint Near Syncope and Fall    HPI Aaron [name] is a 62 y.o. male with a history of coronary artery disease, gout, hyperlipidemia and hypertension who presents to the ED for syncope.  Patient arrives via EMS from home after a syncopal episode.  Patient reports passing out twice at home.  He states he drinks 6 beers last night prior to the passing out episodes.  Is complaining of left ankle and bilateral knee pain.  Patient states having difficulty walking due to to bilateral knee and left ankle pain.  Pain is 10 out of 10 and generalized.  Past Medical History:  Diagnosis Date  . Coronary artery disease   . Gout   . Hx of heart artery stent 01/12/2017  . Hypercholesteremia   . Hypertension     Patient Active Problem List   Diagnosis Date Noted  . Unstable angina ([place]) 01/12/2017  . Pain in limb 07/13/2016  . Tobacco use disorder 07/13/2016  . Elevated blood sugar 07/27/2015  . Controlled gout 12/22/2014  . CAD in native artery 10/09/2014  . Essential (primary) hypertension 10/09/2014  . Dupuytren's disease of palm 10/09/2014  . Psoriasis 10/09/2014  . Pain in shoulder 10/09/2014  . Compulsive tobacco user syndrome 10/09/2014  . H/O cardiac catheterization 01/27/2014  . HLD (hyperlipidemia) 01/27/2014  . Peripheral vascular disease ([place]) 01/27/2014    Past Surgical History:  Procedure Laterality Date  . APPENDECTOMY    . cardiac stents   2012    Allergies Patient has no known allergies.  Social History Social History   Tobacco Use  . Smoking status: Current Every Day Smoker    Packs/day: 0.50    Years: 40.00    Pack years: 20.00    Types: Cigarettes  . Smokeless tobacco:  Never Used  . Tobacco comment: patient not ready  Substance Use Topics  . Alcohol use: Yes    Alcohol/week: 8.4 oz    Types: 14 Standard drinks or equivalent per week    Comment: occasional  . Drug use: No   Review of Systems Constitutional: Negative for fever. Cardiovascular: Negative for chest pain. Respiratory: Negative for shortness of breath. Gastrointestinal: Negative for abdominal pain, vomiting and diarrhea. Musculoskeletal: Positive for bilateral knee and left ankle pain Skin: Negative for rash. Neurological: Negative for headaches, focal weakness or numbness.  All systems negative/normal/unremarkable except as stated in the HPI  ____________________________________________   PHYSICAL EXAM:  VITAL SIGNS: ED Triage Vitals  Enc Vitals Group     BP 09/16/17 0740 108/64     Pulse Rate 09/16/17 0740 91     Resp 09/16/17 0740 16     Temp --      Temp Source 09/16/17 0740 Oral     SpO2 09/16/17 0740 98 %     Weight --      Height --      Head Circumference --      Peak Flow --      Pain Score 09/16/17 0741 10     Pain Loc --      Pain Edu? --      Excl. in [place]? --    Constitutional: Alert and oriented. Well appearing and in no distress. Eyes: Conjunctivae are normal. Normal extraocular movements. ENT  Head: Normocephalic and atraumatic.   Nose: No congestion/rhinnorhea.   Mouth/Throat: Mucous membranes are moist.   Neck: No stridor. Cardiovascular: Normal rate, regular rhythm. No murmurs, rubs, or gallops. Respiratory: Normal respiratory effort without tachypnea nor retractions. Breath sounds are clear and equal bilaterally. No wheezes/rales/rhonchi. Gastrointestinal: Soft and nontender. Normal bowel sounds Musculoskeletal: Pain with range of motion of both knees and left ankle.  No obvious contusions or edema is noted Neurologic:  Normal speech and language. No gross focal neurologic deficits are appreciated.  Skin:  Skin is warm, dry and intact.  No rash noted. Psychiatric: Mood and affect are normal. Speech and behavior are normal.  ____________________________________________  EKG: Interpreted by me.  Sinus rhythm with a rate of 87 bpm, normal PR interval, normal QRS, normal QT.  ____________________________________________  ED COURSE:  As part of my medical decision making, I reviewed the following data within the [facility] History obtained from family if available, nursing notes, old chart and ekg, as well as notes from prior ED visits. Patient presented for syncope with subsequent fall and extremity pain, we will assess with labs and imaging as indicated at this time.   Procedures ____________________________________________   LABS (pertinent positives/negatives)  Labs Reviewed  CBC WITH DIFFERENTIAL/PLATELET - Abnormal; Notable for the following components:      Result Value   WBC 11.7 (*)    HCT 54.4 (*)    RDW 14.6 (*)    Neutro Abs 9.1 (*)    All other components within normal limits  COMPREHENSIVE METABOLIC PANEL - Abnormal; Notable for the following components:   Sodium 134 (*)    Chloride 96 (*)    CO2 18 (*)    Creatinine, Ser 2.79 (*)    Calcium 8.8 (*)    Total Protein 8.4 (*)    AST 54 (*)    GFR calc non Af Amer 23 (*)    GFR calc Af Amer 27 (*)    Anion gap 20 (*)    All other components within normal limits  ETHANOL - Abnormal; Notable for the following components:   Alcohol, Ethyl (B) 114 (*)    All other components within normal limits  TROPONIN I  URINALYSIS, COMPLETE (UACMP) WITH MICROSCOPIC  CBG MONITORING, ED    RADIOLOGY Images were viewed by me  CT head, C-spine series x-rays, knee x-rays, ankle x-rays IMPRESSION: No acute intracranial abnormality.  No acute bony abnormality in the cervical spine. IMPRESSION: Dorsal talar neck irregularity favoring acute avulsion fracture over spurring. Please correlate for focal tenderness. Knee x-rays are  negative ____________________________________________  DIFFERENTIAL DIAGNOSIS   Intoxication, dehydration, electrolyte abnormality, subdural hematoma, fracture  FINAL ASSESSMENT AND PLAN  Fall, minor head injury, alcohol intoxication, acute renal failure   Plan: The patient had presented for 2 syncopal events that occurred at home today. Patient's labs did reveal acute renal failure and also alcohol intoxication. Patient's imaging revealed a possible talar neck avulsion fracture.  It is unclear if this is clinically significant or not.  We will place him in Ace wrap and attempt ambulation.  He will need to be admitted for his renal failure, again we have initiated IV fluids for him.  Patient states he does not drink daily alcohol.   Laurence Aly, MD   Note: This note was generated in part or whole with voice recognition software. Voice recognition is usually quite accurate but there are transcription errors that can and very often do occur. I apologize  for any typographical errors that were not detected and corrected.     Earleen Newport, MD 09/16/17 [phone]

## 2017-09-16 NOTE — Consult Note (Signed)
Patient is a 62 year old white male who suffered a fall injuring his left ankle and both knees.  He is very tender over the medial aspect of both knees and has negative x-rays and says he cannot walk because of his knees.  He has an Ace wrap on his left ankle and has swelling anteriorly with x-ray showing avulsion injury  On examination of the left ankle swelling and tenderness over the anterior talus no tenderness over the deltoid or lateral ligament complex.  Examination of the knees reveals quite a bit of tenderness over the medial proximal tibia bilaterally there is minimal swelling and no ecchymosis no instability he has intact extensor mechanism and is not really tender over the extensor mechanism.  He has pain with attempted flexion at 30 degrees. Radiographs did not show acute injury  Impression is #1 anterior ankle sprain.  This should just require the Ace wrap node additional treatment and is weightbearing as tolerated.  Regarding his knees uncertain as to why these are bothering him so much he does not have a bony injury on plain film and to be unusual to have bilateral significant injury from a fall such as he suffered.  Recommend using ice and starting physical therapy.

## 2017-09-16 NOTE — Progress Notes (Signed)
Patient transported to ultrasound.

## 2017-09-16 NOTE — Progress Notes (Signed)
*  PRELIMINARY RESULTS* Echocardiogram 2D Echocardiogram has been performed.  Aaron [name] 09/16/2017, 2:34 PM

## 2017-09-16 NOTE — ED Triage Notes (Addendum)
Pt to ED via EMS from home with c/o syncopal episode with fall, pt states ETOH use last night before fall. PT A&Ox4, c/o LFT ankle pain. Speech clear, VSS

## 2017-09-16 NOTE — H&P (Signed)
[facility] NAME: Aaron [name]    MR#:  [id number]  DATE OF BIRTH:  10/04/1955  DATE OF ADMISSION:  09/16/2017  PRIMARY CARE PHYSICIAN: Glean Hess, MD   REQUESTING/REFERRING PHYSICIAN: Dr. Lenise Arena  CHIEF COMPLAINT:   Chief Complaint  Patient presents with  . Near Syncope  . Fall    HISTORY OF PRESENT ILLNESS:  Aaron [name]  is a 62 y.o. male with a known history of coronary artery disease, hypertension, hyperlipidemia, history of gout, ongoing tobacco abuse who presents to the hospital after a syncopal event and noted to be in acute kidney injury.  Patient says he woke up at 3:00 in the morning went to use the bathroom and then blacked out.  He says he was down for about 30 minutes and then was having pain in his knees bilaterally therefore crawled and got there his phone and called EMS.  He then had no syncopal episode sitting on the bed when he hit his head.  Patient denies any prodromal symptoms of chest pain, palpitations, nausea vomiting prior to his syncopal events.  Patient does say that he drank 6 beers last night prior to his episodes of syncope.  He does have history of vertigo but is unclear if these syncopal episodes are related to his vertigo.  He came to the ER and was noted to be in acute kidney injury and also noted to have a small right ankle/talus avulsion fracture.  Hospitalist services were contacted for treatment evaluation.  PAST MEDICAL HISTORY:   Past Medical History:  Diagnosis Date  . Coronary artery disease   . Gout   . Hx of heart artery stent 01/12/2017  . Hypercholesteremia   . Hypertension     PAST SURGICAL HISTORY:   Past Surgical History:  Procedure Laterality Date  . APPENDECTOMY    . cardiac stents   2012    SOCIAL HISTORY:   Social History   Tobacco Use  . Smoking status: Current Every Day Smoker    Packs/day: 0.50    Years: 40.00    Pack years: 20.00    Types:  Cigarettes  . Smokeless tobacco: Never Used  . Tobacco comment: patient not ready  Substance Use Topics  . Alcohol use: Yes    Alcohol/week: 8.4 oz    Types: 14 Standard drinks or equivalent per week    Comment: occasional    FAMILY HISTORY:   Family History  Problem Relation Age of Onset  . Hypertension Mother   . Diabetes Mother   . Heart failure Father        age 50  . Cancer Father   . Ovarian cancer Sister   . Heart disease Brother     DRUG ALLERGIES:  No Known Allergies  REVIEW OF SYSTEMS:   Review of Systems  Constitutional: Negative for fever and weight loss.  HENT: Negative for congestion, nosebleeds and tinnitus.   Eyes: Negative for blurred vision, double vision and redness.  Respiratory: Negative for cough, hemoptysis and shortness of breath.   Cardiovascular: Negative for chest pain, orthopnea, leg swelling and PND.  Gastrointestinal: Negative for abdominal pain, diarrhea, melena, nausea and vomiting.  Genitourinary: Negative for dysuria, hematuria and urgency.  Musculoskeletal: Positive for falls. Negative for joint pain.  Neurological: Negative for dizziness, tingling, sensory change, focal weakness, seizures, weakness and headaches.  Endo/Heme/Allergies: Negative for polydipsia. Does not bruise/bleed easily.  Psychiatric/Behavioral: Negative for depression and memory loss.  The patient is not nervous/anxious.     MEDICATIONS AT HOME:   Prior to Admission medications   Medication Sig Start Date End Date Taking? Authorizing Provider  aspirin 325 MG EC tablet Take 1 tablet by mouth daily.   Yes [provider]  cilostazol (PLETAL) 50 MG tablet Take 1 tablet by mouth 2 (two) times daily.    Yes [provider]  clopidogrel (PLAVIX) 75 MG tablet Take 1 tablet by mouth daily.   Yes [provider]  indomethacin (INDOCIN) 25 MG capsule Take 1 capsule (25 mg total) by mouth 2 (two) times daily as needed. 04/02/17  Yes Glean Hess, MD  isosorbide mononitrate (IMDUR) 60 MG 24 hr tablet Take 90 mg by mouth daily.  09/04/16  Yes [provider]  lisinopril (PRINIVIL,ZESTRIL) 10 MG tablet Take 1 tablet by mouth daily.   Yes [provider]  meclizine (ANTIVERT) 25 MG tablet Take 1 tablet (25 mg total) by mouth 3 (three) times daily as needed for dizziness. 06/28/17  Yes Glean Hess, MD  METOPROLOL TARTRATE PO Take by mouth. Take 50MG  by mouth every morning and 75MG  every evening   Yes [provider]  nitroGLYCERIN (NITROSTAT) 0.4 MG SL tablet Place 1 tablet under the tongue as needed.   Yes [provider]  simvastatin (ZOCOR) 40 MG tablet Take 1 tablet (40 mg total) by mouth at bedtime. 05/21/15  Yes Glean Hess, MD  fluticasone [facility]) 50 MCG/ACT nasal spray Place 2 sprays into both nostrils daily. Patient not taking: Reported on 09/16/2017 04/02/17   Glean Hess, MD      VITAL SIGNS:  Blood pressure 108/64, pulse 91, resp. rate 16, SpO2 98 %.  PHYSICAL EXAMINATION:  Physical Exam  GENERAL:  62 y.o.-year-old patient lying in the bed in no acute distress.  EYES: Pupils equal, round, reactive to light and accommodation. No scleral icterus. Extraocular muscles intact.  HEENT: Head atraumatic, normocephalic. Oropharynx and nasopharynx clear. No oropharyngeal erythema, moist oral mucosa  NECK:  Supple, no jugular venous distention. No thyroid enlargement, no tenderness.  LUNGS: Normal breath sounds bilaterally, no wheezing, rales, rhonchi. No use of accessory muscles of respiration.  CARDIOVASCULAR: S1, S2 RRR. No murmurs, rubs, gallops, clicks.  ABDOMEN: Soft, nontender, nondistended. Bowel sounds present. No organomegaly or mass.  EXTREMITIES: No pedal edema, cyanosis, or clubbing. + 2 pedal & radial pulses b/l.   NEUROLOGIC: Cranial nerves II through XII are intact. No focal Motor or sensory deficits appreciated b/l PSYCHIATRIC: The patient is alert and oriented x  3.  SKIN: No obvious rash, lesion, or ulcer.   LABORATORY PANEL:   CBC Recent Labs  Lab 09/16/17 0745  WBC 11.7*  HGB 17.9  HCT 54.4*  PLT 243   ------------------------------------------------------------------------------------------------------------------  Chemistries  Recent Labs  Lab 09/16/17 0745  NA 134*  K 4.5  CL 96*  CO2 18*  GLUCOSE 84  BUN 19  CREATININE 2.79*  CALCIUM 8.8*  AST 54*  ALT 36  ALKPHOS 81  BILITOT 1.1   ------------------------------------------------------------------------------------------------------------------  Cardiac Enzymes Recent Labs  Lab 09/16/17 0745  TROPONINI <0.03   ------------------------------------------------------------------------------------------------------------------  RADIOLOGY:  Dg Ankle Complete Left  Result Date: 09/16/2017 CLINICAL DATA:  Syncopal episode with fall. Left ankle pain. Initial encounter. EXAM: LEFT ANKLE COMPLETE - 3+ VIEW COMPARISON:  None. FINDINGS: Irregular cortical density over the dorsal talar neck which appears discontinuous, especially anteriorly. Normal ankle and hindfoot alignment. Normal ankle and hindfoot alignment. IMPRESSION:  Dorsal talar neck irregularity favoring acute avulsion fracture over spurring. Please correlate for focal tenderness. Electronically Signed   By: Monte Fantasia M.D.   On: 09/16/2017 08:37   Ct Head Wo Contrast  Result Date: 09/16/2017 CLINICAL DATA:  Syncope, fall. Pain in back of head. Posterior neck pain. EXAM: CT HEAD WITHOUT CONTRAST CT CERVICAL SPINE WITHOUT CONTRAST TECHNIQUE: Multidetector CT imaging of the head and cervical spine was performed following the standard protocol without intravenous contrast. Multiplanar CT image reconstructions of the cervical spine were also generated. COMPARISON:  11/06/2012 FINDINGS: CT HEAD FINDINGS Brain: No acute intracranial abnormality. Specifically, no hemorrhage, hydrocephalus, mass lesion, acute infarction, or  significant intracranial injury. Vascular: No hyperdense vessel or unexpected calcification. Skull: No acute calvarial abnormality. Sinuses/Orbits: Mucosal thickening in the paranasal sinuses. Mastoid air cells are clear. Orbital soft tissues unremarkable. Other: None CT CERVICAL SPINE FINDINGS Alignment: No subluxation Skull base and vertebrae: No fracture Soft tissues and spinal canal: Prevertebral soft tissues are normal. No epidural or paraspinal hematoma. Disc levels: Degenerative disc disease with disc space narrowing and spurring at C4-5 and C6-7. Upper chest: Negative Other: Carotid artery calcifications. IMPRESSION: No acute intracranial abnormality. No acute bony abnormality in the cervical spine. Electronically Signed   By: Rolm Baptise M.D.   On: 09/16/2017 08:21   Ct Cervical Spine Wo Contrast  Result Date: 09/16/2017 CLINICAL DATA:  Syncope, fall. Pain in back of head. Posterior neck pain. EXAM: CT HEAD WITHOUT CONTRAST CT CERVICAL SPINE WITHOUT CONTRAST TECHNIQUE: Multidetector CT imaging of the head and cervical spine was performed following the standard protocol without intravenous contrast. Multiplanar CT image reconstructions of the cervical spine were also generated. COMPARISON:  11/06/2012 FINDINGS: CT HEAD FINDINGS Brain: No acute intracranial abnormality. Specifically, no hemorrhage, hydrocephalus, mass lesion, acute infarction, or significant intracranial injury. Vascular: No hyperdense vessel or unexpected calcification. Skull: No acute calvarial abnormality. Sinuses/Orbits: Mucosal thickening in the paranasal sinuses. Mastoid air cells are clear. Orbital soft tissues unremarkable. Other: None CT CERVICAL SPINE FINDINGS Alignment: No subluxation Skull base and vertebrae: No fracture Soft tissues and spinal canal: Prevertebral soft tissues are normal. No epidural or paraspinal hematoma. Disc levels: Degenerative disc disease with disc space narrowing and spurring at C4-5 and C6-7. Upper  chest: Negative Other: Carotid artery calcifications. IMPRESSION: No acute intracranial abnormality. No acute bony abnormality in the cervical spine. Electronically Signed   By: Rolm Baptise M.D.   On: 09/16/2017 08:21   Dg Knee Complete 4 Views Left  Result Date: 09/16/2017 CLINICAL DATA:  Fall.  Bilateral knee pain and ankle pain. EXAM: LEFT KNEE - COMPLETE 4+ VIEW COMPARISON:  None FINDINGS: No joint effusion identified. Mild sharpening of the tibial spines. No fractures or subluxations. No radio-opaque foreign bodies. Atherosclerotic calcifications identified. IMPRESSION: 1. Mild degenerative change. 2. No acute findings. Electronically Signed   By: Kerby Moors M.D.   On: 09/16/2017 08:36   Dg Knee Complete 4 Views Right  Result Date: 09/16/2017 CLINICAL DATA:  62 year old male status post syncope and fall. Pain. EXAM: RIGHT KNEE - COMPLETE 4+ VIEW COMPARISON:  None. FINDINGS: No joint effusion identified on the cross-table lateral view. Bone mineralization is within normal limits for age. Joint spaces and alignment are preserved. No acute osseous abnormality identified. Extensive calcified peripheral vascular disease is noted in the visible right lower extremity. No acute soft tissue finding identified. IMPRESSION: 1. Negative for age radiographic appearance of the right knee. 2. Calcified peripheral vascular disease. Electronically Signed   By:  Genevie Ann M.D.   On: 09/16/2017 08:35     IMPRESSION AND PLAN:   62 year old male with past medical history of coronary artery disease status post stent placement, hypertension, hyperlipidemia, history of gout, tobacco abuse, alcohol abuse who presents to the hospital after a syncopal event and noted to be in acute kidney injury.  1.  Syncope-etiology unclear but suspected be secondary to dehydration from alcohol abuse. - Patient CT head is negative for acute pathology.  His EKG shows no acute evidence of arrhythmia. - Will observe him on telemetry,  cycle his cardiac markers.  I will check an echocardiogram, carotid duplex.  We will need to check orthostatic vital signs.  Continue IV fluid hydration.  2.  Acute kidney injury-secondary to alcohol abuse and dehydration. -We will hydrate the patient with IV fluids, hold his lisinopril, indomethacin for now.  3.  Essential hypertension-continue Imdur, Metoprolol.   4.  Hyperlipidemia-continue simvastatin.    5.  History of vertigo-continue meclizine as needed.  6.  History of peripheral vascular disease/claudication-continue aspirin, Plavix, Pletal, simvastatin.  7.  Alcohol abuse-no evidence of alcohol withdrawal, will watch for symptoms.  Hold off on CIWA protocol for now.  8.  Right ankle/talus avulsion fracture-suspected to be secondary to his syncope/fall.  Patient actually complains of knee pain rather than right ankle pain.  I will get orthopedic consult, get a physical therapy evaluation.  Continue supportive care with pain control with tramadol.  All the records are reviewed and case discussed with ED provider. Management plans discussed with the patient, family and they are in agreement.  CODE STATUS: Full code  TOTAL TIME TAKING CARE OF THIS PATIENT: 45 minutes.    Henreitta Leber M.D on 09/16/2017 at 9:38 AM  Between 7am to 6pm - Pager - [phone]  After 6pm go to [email or web address] - password [email or web address] [facility]  [place] [facility]  Office  [phone]  CC: Primary care physician; Glean Hess, MD

## 2017-09-17 DIAGNOSIS — S92101A Unspecified fracture of right talus, initial encounter for closed fracture: Secondary | ICD-10-CM | POA: Diagnosis not present

## 2017-09-17 DIAGNOSIS — R55 Syncope and collapse: Secondary | ICD-10-CM | POA: Diagnosis not present

## 2017-09-17 DIAGNOSIS — N179 Acute kidney failure, unspecified: Secondary | ICD-10-CM | POA: Diagnosis not present

## 2017-09-17 DIAGNOSIS — I10 Essential (primary) hypertension: Secondary | ICD-10-CM | POA: Diagnosis not present

## 2017-09-17 LAB — CBC
HCT: 44.3 % (ref 40.0–52.0)
HEMOGLOBIN: 14.9 g/dL (ref 13.0–18.0)
MCH: 32.6 pg (ref 26.0–34.0)
MCHC: 33.6 g/dL (ref 32.0–36.0)
MCV: 96.9 fL (ref 80.0–100.0)
Platelets: 189 10*3/uL (ref 150–440)
RBC: 4.57 MIL/uL (ref 4.40–5.90)
RDW: 14.3 % (ref 11.5–14.5)
WBC: 7.1 10*3/uL (ref 3.8–10.6)

## 2017-09-17 LAB — BASIC METABOLIC PANEL
ANION GAP: 10 (ref 5–15)
BUN: 26 mg/dL — ABNORMAL HIGH (ref 6–20)
CALCIUM: 7.9 mg/dL — AB (ref 8.9–10.3)
CO2: 18 mmol/L — AB (ref 22–32)
Chloride: 106 mmol/L (ref 101–111)
Creatinine, Ser: 1.17 mg/dL (ref 0.61–1.24)
Glucose, Bld: 100 mg/dL — ABNORMAL HIGH (ref 65–99)
Potassium: 4 mmol/L (ref 3.5–5.1)
Sodium: 134 mmol/L — ABNORMAL LOW (ref 135–145)

## 2017-09-17 LAB — C DIFFICILE QUICK SCREEN W PCR REFLEX
C Diff antigen: NEGATIVE
C Diff interpretation: NOT DETECTED
C Diff toxin: NEGATIVE

## 2017-09-17 MED ORDER — LOPERAMIDE HCL 2 MG PO CAPS
2.0000 mg | ORAL_CAPSULE | Freq: Four times a day (QID) | ORAL | Status: DC | PRN
  Administered 2017-09-17: 2 mg via ORAL
  Filled 2017-09-17 (×2): qty 1

## 2017-09-17 MED ORDER — LOPERAMIDE HCL 2 MG PO TABS: 2.0000 mg | ORAL_TABLET | Freq: Four times a day (QID) | ORAL | 0 refills | Status: DC | PRN

## 2017-09-17 NOTE — NC FL2 (Signed)
[place] LEVEL OF CARE SCREENING TOOL     IDENTIFICATION  Patient Name: Aaron [name] Birthdate: April 11, 1956 Sex: male Admission Date (Current Location): 09/16/2017  [place] and [place] Number:  Engineering geologist and Address:  [facility], [address], [place], [place] [address]      Provider Number: [id number]  Attending Physician Name and Address:  Henreitta Leber, MD  Relative Name and Phone Number:       Current Level of Care: Hospital Recommended Level of Care: [place] Prior Approval Number:    Date Approved/Denied:   PASRR Number: [id number])  Discharge Plan: SNF    Current Diagnoses: Patient Active Problem List   Diagnosis Date Noted  . Syncope 09/16/2017  . Unstable angina ([place]) 01/12/2017  . Pain in limb 07/13/2016  . Tobacco use disorder 07/13/2016  . Elevated blood sugar 07/27/2015  . Controlled gout 12/22/2014  . CAD in native artery 10/09/2014  . Essential (primary) hypertension 10/09/2014  . Dupuytren's disease of palm 10/09/2014  . Psoriasis 10/09/2014  . Pain in shoulder 10/09/2014  . Compulsive tobacco user syndrome 10/09/2014  . H/O cardiac catheterization 01/27/2014  . HLD (hyperlipidemia) 01/27/2014  . Peripheral vascular disease ([place]) 01/27/2014    Orientation RESPIRATION BLADDER Height & Weight     Self, Time, Situation, Place  Normal Continent Weight: 185 lb (83.9 kg) Height:  6\' 1"  (185.4 cm)  BEHAVIORAL SYMPTOMS/MOOD NEUROLOGICAL BOWEL NUTRITION STATUS      Continent Diet(Heart Healthy)  AMBULATORY STATUS COMMUNICATION OF NEEDS Skin   Extensive Assist Verbally Normal                       Personal Care Assistance Level of Assistance  Bathing, Feeding, Dressing Bathing Assistance: Limited assistance Feeding assistance: Independent Dressing Assistance: Limited assistance     Functional Limitations Info  Sight, Hearing, Speech Sight Info: Adequate Hearing  Info: Adequate Speech Info: Adequate    SPECIAL CARE FACTORS FREQUENCY  PT (By licensed PT), OT (By licensed OT)     PT Frequency: (5) OT Frequency: (5)            Contractures      Additional Factors Info  Code Status, Allergies, Isolation Precautions Code Status Info: (Full Code) Allergies Info: (No Known Allergies)     Isolation Precautions Info: (Enteric Pre)     Current Medications (09/17/2017):  This is the current hospital active medication list Current Facility-Administered Medications  Medication Dose Route Frequency Provider Last Rate Last Dose  . 0.9 %  sodium chloride infusion   Intravenous Continuous Henreitta Leber, MD 125 mL/hr at 09/16/17 2032    . acetaminophen (TYLENOL) tablet 650 mg  650 mg Oral Q6H PRN Henreitta Leber, MD   650 mg at 09/16/17 1632   Or  . acetaminophen (TYLENOL) suppository 650 mg  650 mg Rectal Q6H PRN Henreitta Leber, MD      . aspirin EC tablet 325 mg  325 mg Oral Daily Henreitta Leber, MD   325 mg at 09/16/17 1213  . cilostazol (PLETAL) tablet 50 mg  50 mg Oral BID Henreitta Leber, MD   50 mg at 09/16/17 2121  . clopidogrel (PLAVIX) tablet 75 mg  75 mg Oral Daily Henreitta Leber, MD   75 mg at 09/16/17 1213  . heparin injection 5,000 Units  5,000 Units Subcutaneous Q8H Henreitta Leber, MD   5,000 Units at 09/17/17 0552  .  isosorbide mononitrate (IMDUR) 24 hr tablet 90 mg  90 mg Oral Daily Henreitta Leber, MD   90 mg at 09/16/17 1429  . meclizine (ANTIVERT) tablet 25 mg  25 mg Oral TID PRN Henreitta Leber, MD      . metoprolol tartrate (LOPRESSOR) tablet 50 mg  50 mg Oral Daily Henreitta Leber, MD   50 mg at 09/16/17 1213  . metoprolol tartrate (LOPRESSOR) tablet 75 mg  75 mg Oral QHS Henreitta Leber, MD   75 mg at 09/16/17 2121  . ondansetron (ZOFRAN) tablet 4 mg  4 mg Oral Q6H PRN Henreitta Leber, MD       Or  . ondansetron (ZOFRAN) injection 4 mg  4 mg Intravenous Q6H PRN Henreitta Leber, MD      . simvastatin (ZOCOR)  tablet 40 mg  40 mg Oral QHS Henreitta Leber, MD   40 mg at 09/16/17 2121  . traMADol (ULTRAM) tablet 50 mg  50 mg Oral Q6H PRN Henreitta Leber, MD   50 mg at 09/16/17 2032     Discharge Medications: Please see discharge summary for a list of discharge medications.  Relevant Imaging Results:  Relevant Lab Results:   Additional Information (SSN: [id number])  Smith Mince, Student-Social Work

## 2017-09-17 NOTE — Care Management Note (Signed)
Case Management Note  Patient Details  Name: DARRIS STAIGER MRN: [id number] Date of Birth: 1956-01-19  Subjective/Objective:   Admitted following a fall with syncope. Left ankle sprain. PT pending. Met with patient at bedside to discuss observation notice. He lives at home alone. He does not drive. He has family that drives him to appointments. Prior to admission, he used no DME. Discussed the possible need for home health PT. He is agreeable. Offered choice of agencies. He chose [facility]. Will get DME based on PT recommendations.  RNCM following                Action/Plan:   Expected Discharge Date:  09/17/17               Expected Discharge Plan:  [place]  In-House Referral:     Discharge planning Services  CM Consult  Post Acute Care Choice:  Home Health Choice offered to:  Patient  DME Arranged:    DME Agency:     HH Arranged:  PT [place]:  [place]  Status of Service:  In process, will continue to follow  If discussed at Long Length of Stay Meetings, dates discussed:    Additional Comments:  Jolly Mango, RN 09/17/2017, 9:05 AM

## 2017-09-17 NOTE — Discharge Instructions (Signed)
Keep leg elevated Keep knees iced 20 minutes at a time 3-4 times a day

## 2017-09-17 NOTE — Evaluation (Signed)
Physical Therapy Evaluation Patient Details Name: Aaron [name] MRN: [id number] DOB: 1956/06/23 Today's Date: 09/17/2017   History of Present Illness  Pt admitted with L ankle sprain s/p sycope and collapse.  PMH includes Htn, hypercholestrolemia, stent placement and CAD.  Clinical Impression  Pt is a 62 year old male who lives in a one story home alone.  He was independent prior to admission and is able to perform bed mobility independently.  Pt reported 0/10 pain at rest and slight pain increase during ambulation.  Pt was able to perform transfers and ambulation with supervision for education concerning sequencing, WBAT status and safe management of RW.  Pt was able to complete 50 ft of ambulation with RW safely, with slightly decreased wt bearing on L LE.  Pt demonstrated WNL strength of UE's for use of RW and decreased strength of bilateral ankles due to pt reported pain.  Pt reported no N/T in bilateral LE's but stated that he feels his swelling has increased in bilateral ankles since yesterday.  PT educated pt concerning safe stair negotiation and pt expressed confidence and understanding. Pt will continue to benefit from skilled PT with focus on strength, pain management, tolerance to activity, functional mobility and safe use of AD.    Follow Up Recommendations Home health PT    Equipment Recommendations  Rolling walker with 5" wheels    Recommendations for Other Services       Precautions / Restrictions Precautions Precautions: Fall Restrictions Weight Bearing Restrictions: Yes LLE Weight Bearing: Weight bearing as tolerated      Mobility  Bed Mobility Overal bed mobility: Independent                Transfers Overall transfer level: Needs assistance Equipment used: Rolling walker (2 wheeled) Transfers: Sit to/from Stand Sit to Stand: Supervision         General transfer comment: Pt was able to initiate and complete transfer without physical assist following  education concerning proper hand placement.  Ambulation/Gait Ambulation/Gait assistance: Supervision Ambulation Distance (Feet): 50 Feet Assistive device: Rolling walker (2 wheeled)     Gait velocity interpretation: Below normal speed for age/gender General Gait Details: Pt was able to ambulate with RW in room following PT education concerning sequencing and management of RW.  Pt presented with a three point gait with decreased WB on L LE.  Pt reported mild increase in pain of L LE following 30 ft but no pain increase in R LE.  PT provided education concerning sequencing for safe ascending and descending stairs for home entry and pt expressed understanding.  Stairs            Wheelchair Mobility    Modified Rankin (Stroke Patients Only)       Balance                                             Pertinent Vitals/Pain Pain Assessment: Faces Faces Pain Scale: Hurts little more Pain Location: Pt reported 0/10 pain in bilateral ankles at rest but increased pain following 30 ft of ambulation.    Home Living Family/patient expects to be discharged to:: Private residence Living Arrangements: Alone Available Help at Discharge: Family;Available PRN/intermittently(Brother can drive to doctor's appointments) Type of Home: House Home Access: Stairs to enter Entrance Stairs-Rails: None Entrance Stairs-Number of Steps: 2 Home Layout: One level Home Equipment: None  Prior Function Level of Independence: Independent         Comments: Pt does not drive.  States that he has not had a need for AD's or modifications.     Hand Dominance   Dominant Hand: Left    Extremity/Trunk Assessment   Upper Extremity Assessment Upper Extremity Assessment: Overall WFL for tasks assessed    Lower Extremity Assessment Lower Extremity Assessment: Generalized weakness(Pt demonstrated decreased strength of bilateral ankle PF/DF, inversion/eversion due to pt reported  pain and increased swelling in lateral ankles.  PT did not note significant swelling in R lateral ankle.)    Cervical / Trunk Assessment Cervical / Trunk Assessment: Normal  Communication   Communication: No difficulties  Cognition Arousal/Alertness: Awake/alert Behavior During Therapy: WFL for tasks assessed/performed Overall Cognitive Status: Within Functional Limits for tasks assessed                                        General Comments      Exercises     Assessment/Plan    PT Assessment Patient needs continued PT services  PT Problem List Decreased strength;Decreased mobility;Decreased range of motion;Decreased activity tolerance;Decreased balance;Decreased knowledge of use of DME;Pain       PT Treatment Interventions DME instruction;Therapeutic activities;Gait training;Therapeutic exercise;Stair training;Balance training;Functional mobility training;Patient/family education    PT Goals (Current goals can be found in the Care Plan section)  Acute Rehab PT Goals Patient Stated Goal: To return home and continue with mobility as before.  To manage pain. PT Goal Formulation: With patient Time For Goal Achievement: 10/01/17 Potential to Achieve Goals: Good    Frequency 7X/week   Barriers to discharge        Co-evaluation               AM-PAC PT "6 Clicks" Daily Activity  Outcome Measure Difficulty turning over in bed (including adjusting bedclothes, sheets and blankets)?: None Difficulty moving from lying on back to sitting on the side of the bed? : A Little Difficulty sitting down on and standing up from a chair with arms (e.g., wheelchair, bedside commode, etc,.)?: A Little Help needed moving to and from a bed to chair (including a wheelchair)?: A Little Help needed walking in hospital room?: A Little Help needed climbing 3-5 steps with a railing? : A Little 6 Click Score: 19    End of Session Equipment Utilized During Treatment: Gait  belt Activity Tolerance: Patient limited by pain;Patient tolerated treatment well Patient left: in chair;with call bell/phone within reach;with chair alarm set   PT Visit Diagnosis: Unsteadiness on feet (R26.81);Muscle weakness (generalized) (M62.81);Pain Pain - Right/Left: Left Pain - part of body: Ankle and joints of foot    Time: 4081-4481 PT Time Calculation (min) (ACUTE ONLY): 35 min   Charges:   PT Evaluation $PT Eval Low Complexity: 1 Low PT Treatments $Gait Training: 8-22 mins   PT G Codes:   PT G-Codes **NOT FOR INPATIENT CLASS** Functional Assessment Tool Used: AM-PAC 6 Clicks Basic Mobility    Roxanne Gates, PT, DPT   Roxanne Gates 09/17/2017, 9:55 AM

## 2017-09-17 NOTE — Care Management Obs Status (Signed)
MEDICARE OBSERVATION STATUS NOTIFICATION   Patient Details  Name: Aaron [name] MRN: [id number] Date of Birth: 01-25-1956   Medicare Observation Status Notification Given:  Yes    Jolly Mango, RN 09/17/2017, 8:57 AM

## 2017-09-17 NOTE — Care Management Note (Signed)
Case Management Note  Patient Details  Name: Aaron [name] MRN: [id number] Date of Birth: 08-Feb-1956  Subjective/Objective:  Discharging today.Referral to [facility] for [facility]PT.                  Action/Plan: Updated patient on referral to [facility] for HHPT. Ordered walker from [place] with [facility].    Expected Discharge Date:  09/17/17               Expected Discharge Plan:  [place]  In-House Referral:     Discharge planning Services  CM Consult  Post Acute Care Choice:  Home Health Choice offered to:  Patient  DME Arranged:    DME Agency:     HH Arranged:  PT [place]:  [place]  Status of Service:  Completed, signed off  If discussed at [place] of Stay Meetings, dates discussed:    Additional Comments:  Jolly Mango, RN 09/17/2017, 11:35 AM

## 2017-09-17 NOTE — Discharge Summary (Signed)
[place] [facility] [place] NAME: Aaron [name]    MR#:  [id number]  DATE OF BIRTH:  1955/12/19  DATE OF ADMISSION:  09/16/2017 ADMITTING PHYSICIAN: Henreitta Leber, MD  DATE OF DISCHARGE: 09/17/2017  PRIMARY CARE PHYSICIAN: Glean Hess, MD    ADMISSION DIAGNOSIS:  Syncope and collapse [E33] Alcoholic intoxication without complication ([place]) [I95.188] Acute renal failure, unspecified acute renal failure type ([place]) [N17.9] Closed displaced fracture of left talus, unspecified portion of talus, initial encounter [S92.102A] Syncope [R55]  DISCHARGE DIAGNOSIS:  Active Problems:   Syncope   SECONDARY DIAGNOSIS:   Past Medical History:  Diagnosis Date  . Coronary artery disease   . Gout   . Hx of heart artery stent 01/12/2017  . Hypercholesteremia   . Hypertension     HOSPITAL COURSE:   62 year old male with past medical history of coronary artery disease status post stent placement, hypertension, hyperlipidemia, history of gout, tobacco abuse, alcohol abuse who presents to the hospital after a syncopal event and noted to be in acute kidney injury.  1.  Syncope-secondary to dehydration from alcohol abuse. -patient was observed overnight on telemetry, had no evidence f acute arrhythmia, patient's carotid duplex were negative for any hemodynamically significant carotid artery stenosis, his echocardiogram shows normal ejection fraction. His CT head was negative on admission and he has had no further episodes of syncope while in the hospital.  2.  Acute kidney injury-secondary to alcohol abuse and dehydration. -patient was hydrated with IV fluids and his creatinine has improved and is back to baseline. Patient will resume his lisinopril upon discharge..  3.  Essential hypertension- he will continue Imdur, Metoprolol and resume his Lisinopril as his Cr. Has normalized now.   4.  Hyperlipidemia- he will continue simvastatin.    5.   History of vertigo- he will continue meclizine as needed.  6.  History of peripheral vascular disease/claudication- he will continue aspirin, Plavix, Pletal, simvastatin.  7.  Alcohol abuse-no evidence of alcohol withdrawal while in the hospital.   8.  Right ankle/talus avulsion fracture- secondary to his syncope/fall.   - seen by orthopedics and they recommend pain control and physical therapy. Seen by physical therapy and patient is being discharged with home health physical therapy with a rolling walker. He will continue Tylenol as needed for his pain.  9. Diarrhea - pt. Developed some diarrhea overnight. C. Diff is (-). GI PCR sent but not back.  - cont. PRN Imodium as needed as outpatient.    DISCHARGE CONDITIONS:   Stable.   CONSULTS OBTAINED:  Treatment Team:  Hessie Knows, MD  DRUG ALLERGIES:  No Known Allergies  DISCHARGE MEDICATIONS:   Allergies as of 09/17/2017   No Known Allergies     Medication List    STOP taking these medications   fluticasone 50 MCG/ACT nasal spray Commonly known as:  FLONASE     TAKE these medications   aspirin 325 MG EC tablet Take 1 tablet by mouth daily.   cilostazol 50 MG tablet Commonly known as:  PLETAL Take 1 tablet by mouth 2 (two) times daily.   clopidogrel 75 MG tablet Commonly known as:  PLAVIX Take 1 tablet by mouth daily.   indomethacin 25 MG capsule Commonly known as:  INDOCIN Take 1 capsule (25 mg total) by mouth 2 (two) times daily as needed.   isosorbide mononitrate 60 MG 24 hr tablet Commonly known as:  IMDUR Take 90 mg by mouth daily.  lisinopril 10 MG tablet Commonly known as:  PRINIVIL,ZESTRIL Take 1 tablet by mouth daily.   loperamide 2 MG tablet Commonly known as:  IMODIUM A-D Take 1 tablet (2 mg total) by mouth 4 (four) times daily as needed for diarrhea or loose stools.   meclizine 25 MG tablet Commonly known as:  ANTIVERT Take 1 tablet (25 mg total) by mouth 3 (three) times daily as  needed for dizziness.   METOPROLOL TARTRATE PO Take by mouth. Take 50MG  by mouth every morning and 75MG  every evening   nitroGLYCERIN 0.4 MG SL tablet Commonly known as:  NITROSTAT Place 1 tablet under the tongue as needed.   simvastatin 40 MG tablet Commonly known as:  ZOCOR Take 1 tablet (40 mg total) by mouth at bedtime.            Durable Medical Equipment  (From admission, onward)        Start     Ordered   09/17/17 1138  For home use only DME Walker rolling  Once    Question:  Patient needs a walker to treat with the following condition  Answer:  Talus fracture   09/17/17 1138        DISCHARGE INSTRUCTIONS:   DIET:  Cardiac diet  DISCHARGE CONDITION:  Stable  ACTIVITY:  Activity as tolerated  OXYGEN:  Home Oxygen: No.   Oxygen Delivery: room air  DISCHARGE LOCATION:  Home with Home health PT   If you experience worsening of your admission symptoms, develop shortness of breath, life threatening emergency, suicidal or homicidal thoughts you must seek medical attention immediately by calling 911 or calling your MD immediately  if symptoms less severe.  You Must read complete instructions/literature along with all the possible adverse reactions/side effects for all the Medicines you take and that have been prescribed to you. Take any new Medicines after you have completely understood and accpet all the possible adverse reactions/side effects.   Please note  You were cared for by a hospitalist during your hospital stay. If you have any questions about your discharge medications or the care you received while you were in the hospital after you are discharged, you can call the unit and asked to speak with the hospitalist on call if the hospitalist that took care of you is not available. Once you are discharged, your primary care physician will handle any further medical issues. Please note that NO REFILLS for any discharge medications will be authorized once  you are discharged, as it is imperative that you return to your primary care physician (or establish a relationship with a primary care physician if you do not have one) for your aftercare needs so that they can reassess your need for medications and monitor your lab values.     Today   Patient having some diarrhea today but stool for C. Difficile is negative. We'll discharge on some as needed Imodium. Renal Function back to baseline, no further episodes of syncope.  VITAL SIGNS:  Blood pressure (!) 144/84, pulse 74, temperature (!) 97.5 F (36.4 C), temperature source Oral, resp. rate 16, height 6\' 1"  (1.854 m), weight 83.9 kg (185 lb), SpO2 95 %.  I/O:    Intake/Output Summary (Last 24 hours) at 09/17/2017 1418 Last data filed at 09/17/2017 1148 Gross per 24 hour  Intake 3781.67 ml  Output 875 ml  Net 2906.67 ml    PHYSICAL EXAMINATION:   GENERAL:  62 y.o.-year-old patient lying in the bed in no acute distress.  EYES: Pupils equal, round, reactive to light and accommodation. No scleral icterus. Extraocular muscles intact.  HEENT: Head atraumatic, normocephalic. Oropharynx and nasopharynx clear. No oropharyngeal erythema, moist oral mucosa  NECK:  Supple, no jugular venous distention. No thyroid enlargement, no tenderness.  LUNGS: Normal breath sounds bilaterally, no wheezing, rales, rhonchi. No use of accessory muscles of respiration.  CARDIOVASCULAR: S1, S2 RRR. No murmurs, rubs, gallops, clicks.  ABDOMEN: Soft, nontender, nondistended. Bowel sounds present. No organomegaly or mass.  EXTREMITIES: No pedal edema, cyanosis, or clubbing. + 2 pedal & radial pulses b/l.   NEUROLOGIC: Cranial nerves II through XII are intact. No focal Motor or sensory deficits appreciated b/l PSYCHIATRIC: The patient is alert and oriented x 3.  SKIN: No obvious rash, lesion, or ulcer.   DATA REVIEW:   CBC Recent Labs  Lab 09/17/17 0342  WBC 7.1  HGB 14.9  HCT 44.3  PLT 189    Chemistries   Recent Labs  Lab 09/16/17 0745 09/17/17 0342  NA 134* 134*  K 4.5 4.0  CL 96* 106  CO2 18* 18*  GLUCOSE 84 100*  BUN 19 26*  CREATININE 2.79* 1.17  CALCIUM 8.8* 7.9*  AST 54*  --   ALT 36  --   ALKPHOS 81  --   BILITOT 1.1  --     Cardiac Enzymes Recent Labs  Lab 09/16/17 1928  TROPONINI <0.03    Microbiology Results  Results for orders placed or performed during the hospital encounter of 09/16/17  C difficile quick scan w PCR reflex     Status: None   Collection Time: 09/17/17  6:05 AM  Result Value Ref Range Status   C Diff antigen NEGATIVE NEGATIVE Final   C Diff toxin NEGATIVE NEGATIVE Final   C Diff interpretation No C. difficile detected.  Final    Comment: Performed at [facility], [address] [place], [place] [address]    RADIOLOGY:  Dg Ankle Complete Left  Result Date: 09/16/2017 CLINICAL DATA:  Syncopal episode with fall. Left ankle pain. Initial encounter. EXAM: LEFT ANKLE COMPLETE - 3+ VIEW COMPARISON:  None. FINDINGS: Irregular cortical density over the dorsal talar neck which appears discontinuous, especially anteriorly. Normal ankle and hindfoot alignment. Normal ankle and hindfoot alignment. IMPRESSION: Dorsal talar neck irregularity favoring acute avulsion fracture over spurring. Please correlate for focal tenderness. Electronically Signed   By: Monte Fantasia M.D.   On: 09/16/2017 08:37   Ct Head Wo Contrast  Result Date: 09/16/2017 CLINICAL DATA:  Syncope, fall. Pain in back of head. Posterior neck pain. EXAM: CT HEAD WITHOUT CONTRAST CT CERVICAL SPINE WITHOUT CONTRAST TECHNIQUE: Multidetector CT imaging of the head and cervical spine was performed following the standard protocol without intravenous contrast. Multiplanar CT image reconstructions of the cervical spine were also generated. COMPARISON:  11/06/2012 FINDINGS: CT HEAD FINDINGS Brain: No acute intracranial abnormality. Specifically, no hemorrhage, hydrocephalus, mass lesion,  acute infarction, or significant intracranial injury. Vascular: No hyperdense vessel or unexpected calcification. Skull: No acute calvarial abnormality. Sinuses/Orbits: Mucosal thickening in the paranasal sinuses. Mastoid air cells are clear. Orbital soft tissues unremarkable. Other: None CT CERVICAL SPINE FINDINGS Alignment: No subluxation Skull base and vertebrae: No fracture Soft tissues and spinal canal: Prevertebral soft tissues are normal. No epidural or paraspinal hematoma. Disc levels: Degenerative disc disease with disc space narrowing and spurring at C4-5 and C6-7. Upper chest: Negative Other: Carotid artery calcifications. IMPRESSION: No acute intracranial abnormality. No acute bony abnormality in the cervical spine.  Electronically Signed   By: Rolm Baptise M.D.   On: 09/16/2017 08:21   Ct Cervical Spine Wo Contrast  Result Date: 09/16/2017 CLINICAL DATA:  Syncope, fall. Pain in back of head. Posterior neck pain. EXAM: CT HEAD WITHOUT CONTRAST CT CERVICAL SPINE WITHOUT CONTRAST TECHNIQUE: Multidetector CT imaging of the head and cervical spine was performed following the standard protocol without intravenous contrast. Multiplanar CT image reconstructions of the cervical spine were also generated. COMPARISON:  11/06/2012 FINDINGS: CT HEAD FINDINGS Brain: No acute intracranial abnormality. Specifically, no hemorrhage, hydrocephalus, mass lesion, acute infarction, or significant intracranial injury. Vascular: No hyperdense vessel or unexpected calcification. Skull: No acute calvarial abnormality. Sinuses/Orbits: Mucosal thickening in the paranasal sinuses. Mastoid air cells are clear. Orbital soft tissues unremarkable. Other: None CT CERVICAL SPINE FINDINGS Alignment: No subluxation Skull base and vertebrae: No fracture Soft tissues and spinal canal: Prevertebral soft tissues are normal. No epidural or paraspinal hematoma. Disc levels: Degenerative disc disease with disc space narrowing and spurring at  C4-5 and C6-7. Upper chest: Negative Other: Carotid artery calcifications. IMPRESSION: No acute intracranial abnormality. No acute bony abnormality in the cervical spine. Electronically Signed   By: Rolm Baptise M.D.   On: 09/16/2017 08:21   US Carotid Bilateral  Result Date: 09/16/2017 CLINICAL DATA:  62 year old male with a history of stroke. Cardiovascular risk factors include hypertension, known coronary disease, hyperlipidemia, tobacco use EXAM: BILATERAL CAROTID DUPLEX ULTRASOUND TECHNIQUE: Pearline Cables scale imaging, color Doppler and duplex ultrasound were performed of bilateral carotid and vertebral arteries in the neck. COMPARISON:  No prior duplex FINDINGS: Criteria: Quantification of carotid stenosis is based on velocity parameters that correlate the residual internal carotid diameter with NASCET-based stenosis levels, using the diameter of the distal internal carotid lumen as the denominator for stenosis measurement. The following velocity measurements were obtained: RIGHT ICA:  Systolic 80 cm/sec, Diastolic 18 cm/sec CCA:  79 cm/sec SYSTOLIC ICA/CCA RATIO:  1.0 ECA:  190 cm/sec LEFT ICA:  Systolic 87 cm/sec, Diastolic 18 cm/sec CCA:  81 cm/sec SYSTOLIC ICA/CCA RATIO:  1.1 ECA:  180 cm/sec Right Brachial SBP: Not acquired Left Brachial SBP: Not acquired RIGHT CAROTID ARTERY: No significant calcifications of the right common carotid artery. Intermediate waveform maintained. Heterogeneous and partially calcified plaque at the right carotid bifurcation. No significant lumen shadowing. Low resistance waveform of the right ICA. No significant tortuosity. RIGHT VERTEBRAL ARTERY: Antegrade flow with low resistance waveform. LEFT CAROTID ARTERY: No significant calcifications of the left common carotid artery. Intermediate waveform maintained. Heterogeneous and partially calcified plaque at the left carotid bifurcation without significant lumen shadowing. Low resistance waveform of the left ICA. No significant  tortuosity. LEFT VERTEBRAL ARTERY:  Antegrade flow with low resistance waveform. IMPRESSION: Color duplex indicates moderate heterogeneous and calcified plaque, with no hemodynamically significant stenosis by duplex criteria in the extracranial cerebrovascular circulation. Signed, Dulcy Fanny. Earleen Newport, DO [facility] Electronically Signed   By: Corrie Mckusick D.O.   On: 09/16/2017 15:55   Dg Knee Complete 4 Views Left  Result Date: 09/16/2017 CLINICAL DATA:  Fall.  Bilateral knee pain and ankle pain. EXAM: LEFT KNEE - COMPLETE 4+ VIEW COMPARISON:  None FINDINGS: No joint effusion identified. Mild sharpening of the tibial spines. No fractures or subluxations. No radio-opaque foreign bodies. Atherosclerotic calcifications identified. IMPRESSION: 1. Mild degenerative change. 2. No acute findings. Electronically Signed   By: Kerby Moors M.D.   On: 09/16/2017 08:36   Dg Knee Complete 4 Views Right  Result Date: 09/16/2017 CLINICAL DATA:  62 year old male status post syncope and fall. Pain. EXAM: RIGHT KNEE - COMPLETE 4+ VIEW COMPARISON:  None. FINDINGS: No joint effusion identified on the cross-table lateral view. Bone mineralization is within normal limits for age. Joint spaces and alignment are preserved. No acute osseous abnormality identified. Extensive calcified peripheral vascular disease is noted in the visible right lower extremity. No acute soft tissue finding identified. IMPRESSION: 1. Negative for age radiographic appearance of the right knee. 2. Calcified peripheral vascular disease. Electronically Signed   By: Genevie Ann M.D.   On: 09/16/2017 08:35      Management plans discussed with the patient, family and they are in agreement.  CODE STATUS:     Code Status Orders  (From admission, onward)        Start     Ordered   09/16/17 1145  Full code  Continuous     09/16/17 1144    Code Status History    Date Active Date Inactive Code  Status Order ID Comments User Context   01/12/2017 04:11 01/12/2017 16:22 Full Code [id number]  Saundra Shelling, MD Inpatient      TOTAL TIME TAKING CARE OF THIS PATIENT: 40 minutes.    Henreitta Leber M.D on 09/17/2017 at 2:18 PM  Between 7am to 6pm - Pager - [phone]  After 6pm go to [email or web address] - Proofreader  [facility]  Office  [phone]  CC: Primary care physician; Glean Hess, MD

## 2017-09-17 NOTE — Progress Notes (Signed)
Notified Dr. Verdell Carmine after speaking with the lab that the gastrointestinal panel is now send out and takes 3 days for results. Orders received to discharge patient.   Discharge instructions given to patient with verbalization of understanding. IV discontinued without difficulty. Patient escorted by nursing via wheelchair. Transported home with his friend.

## 2017-09-17 NOTE — Progress Notes (Signed)
Verbal order received to discontinue telemetry from Dr. Verdell Carmine

## 2017-09-19 LAB — MISC LABCORP TEST (SEND OUT): Labcorp test code: 183480

## 2017-09-30 DIAGNOSIS — I200 Unstable angina: Secondary | ICD-10-CM | POA: Diagnosis not present

## 2017-09-30 DIAGNOSIS — S93402A Sprain of unspecified ligament of left ankle, initial encounter: Secondary | ICD-10-CM | POA: Diagnosis not present

## 2017-09-30 DIAGNOSIS — I10 Essential (primary) hypertension: Secondary | ICD-10-CM | POA: Diagnosis not present

## 2017-09-30 DIAGNOSIS — I2510 Atherosclerotic heart disease of native coronary artery without angina pectoris: Secondary | ICD-10-CM | POA: Diagnosis not present

## 2017-09-30 DIAGNOSIS — R079 Chest pain, unspecified: Secondary | ICD-10-CM | POA: Diagnosis not present

## 2017-09-30 DIAGNOSIS — S93411A Sprain of calcaneofibular ligament of right ankle, initial encounter: Secondary | ICD-10-CM | POA: Diagnosis not present

## 2017-09-30 DIAGNOSIS — I739 Peripheral vascular disease, unspecified: Secondary | ICD-10-CM | POA: Diagnosis not present

## 2017-09-30 DIAGNOSIS — Z9889 Other specified postprocedural states: Secondary | ICD-10-CM | POA: Diagnosis not present

## 2017-10-05 ENCOUNTER — Other Ambulatory Visit: Payer: Self-pay | Admitting: Internal Medicine

## 2017-10-05 DIAGNOSIS — M109 Gout, unspecified: Secondary | ICD-10-CM

## 2017-10-17 ENCOUNTER — Ambulatory Visit (INDEPENDENT_AMBULATORY_CARE_PROVIDER_SITE_OTHER): Payer: PPO

## 2017-10-17 ENCOUNTER — Ambulatory Visit: Payer: Self-pay

## 2017-10-17 VITALS — BP 106/60 | HR 71 | Temp 98.6°F | Ht 73.0 in | Wt 198.2 lb

## 2017-10-17 DIAGNOSIS — Z0000 Encounter for general adult medical examination without abnormal findings: Secondary | ICD-10-CM | POA: Diagnosis not present

## 2017-10-17 NOTE — Patient Instructions (Addendum)
Aaron [name] , Thank you for taking time to come for your Medicare Wellness Visit. I appreciate your ongoing commitment to your health goals. Please review the following plan we discussed and let me know if I can assist you in the future.   Screening recommendations/referrals: Colorectal Screening: Completed 03/30/13. Repeat every 10 years  Vision and Dental Exams: Recommended annual ophthalmology exams for early detection of glaucoma and other disorders of the eye Recommended annual dental exams for proper oral hygiene  Vaccinations: Influenza vaccine: Up to date Pneumococcal vaccine: Not yet required Tdap vaccine: Up to date Shingles vaccine: Not yet required    Advanced directives: Advance directive discussed with you today. I have provided a copy for you to complete at home and have notarized. Once this is complete please bring a copy in to our office so we can scan it into your chart.  Conditions/risks identified: Recommend to drink at least 6-8 8oz glasses of water per day.  Next appointment: Please schedule your Annual Wellness Visit with your Nurse Health Advisor in one year.  Preventive Care 40-64 Years, Male Preventive care refers to lifestyle choices and visits with your health care provider that can promote health and wellness. What does preventive care include?  A yearly physical exam. This is also called an annual well check.  Dental exams once or twice a year.  Routine eye exams. Ask your health care provider how often you should have your eyes checked.  Personal lifestyle choices, including:  Daily care of your teeth and gums.  Regular physical activity.  Eating a healthy diet.  Avoiding tobacco and drug use.  Limiting alcohol use.  Practicing safe sex.  Taking low-dose aspirin every day starting at age 46. What happens during an annual well check? The services and screenings done by your health care provider during your annual well check will depend on  your age, overall health, lifestyle risk factors, and family history of disease. Counseling  Your health care provider may ask you questions about your:  Alcohol use.  Tobacco use.  Drug use.  Emotional well-being.  Home and relationship well-being.  Sexual activity.  Eating habits.  Work and work Statistician. Screening  You may have the following tests or measurements:  Height, weight, and BMI.  Blood pressure.  Lipid and cholesterol levels. These may be checked every 5 years, or more frequently if you are over 29 years old.  Skin check.  Lung cancer screening. You may have this screening every year starting at age 54 if you have a 30-pack-year history of smoking and currently smoke or have quit within the past 15 years.  Fecal occult blood test (FOBT) of the stool. You may have this test every year starting at age 44.  Flexible sigmoidoscopy or colonoscopy. You may have a sigmoidoscopy every 5 years or a colonoscopy every 10 years starting at age 53.  Prostate cancer screening. Recommendations will vary depending on your family history and other risks.  Hepatitis C blood test.  Hepatitis B blood test.  Sexually transmitted disease (STD) testing.  Diabetes screening. This is done by checking your blood sugar (glucose) after you have not eaten for a while (fasting). You may have this done every 1-3 years. Discuss your test results, treatment options, and if necessary, the need for more tests with your health care provider. Vaccines  Your health care provider may recommend certain vaccines, such as:  Influenza vaccine. This is recommended every year.  Tetanus, diphtheria, and acellular pertussis (Tdap,  Td) vaccine. You may need a Td booster every 10 years.  Zoster vaccine. You may need this after age 7.  Pneumococcal 13-valent conjugate (PCV13) vaccine. You may need this if you have certain conditions and have not been vaccinated.  Pneumococcal polysaccharide  (PPSV23) vaccine. You may need one or two doses if you smoke cigarettes or if you have certain conditions. Talk to your health care provider about which screenings and vaccines you need and how often you need them. This information is not intended to replace advice given to you by your health care provider. Make sure you discuss any questions you have with your health care provider. Document Released: 07/22/2015 Document Revised: 03/14/2016 Document Reviewed: 04/26/2015 Elsevier Interactive Patient Education  2017 [place] Prevention in the Home Falls can cause injuries. They can happen to people of all ages. There are many things you can do to make your home safe and to help prevent falls. What can I do on the outside of my home?  Regularly fix the edges of walkways and driveways and fix any cracks.  Remove anything that might make you trip as you walk through a door, such as a raised step or threshold.  Trim any bushes or trees on the path to your home.  Use bright outdoor lighting.  Clear any walking paths of anything that might make someone trip, such as rocks or tools.  Regularly check to see if handrails are loose or broken. Make sure that both sides of any steps have handrails.  Any raised decks and porches should have guardrails on the edges.  Have any leaves, snow, or ice cleared regularly.  Use sand or salt on walking paths during winter.  Clean up any spills in your garage right away. This includes oil or grease spills. What can I do in the bathroom?  Use night lights.  Install grab bars by the toilet and in the tub and shower. Do not use towel bars as grab bars.  Use non-skid mats or decals in the tub or shower.  If you need to sit down in the shower, use a plastic, non-slip stool.  Keep the floor dry. Clean up any water that spills on the floor as soon as it happens.  Remove soap buildup in the tub or shower regularly.  Attach bath mats securely  with double-sided non-slip rug tape.  Do not have throw rugs and other things on the floor that can make you trip. What can I do in the bedroom?  Use night lights.  Make sure that you have a light by your bed that is easy to reach.  Do not use any sheets or blankets that are too big for your bed. They should not hang down onto the floor.  Have a firm chair that has side arms. You can use this for support while you get dressed.  Do not have throw rugs and other things on the floor that can make you trip. What can I do in the kitchen?  Clean up any spills right away.  Avoid walking on wet floors.  Keep items that you use a lot in easy-to-reach places.  If you need to reach something above you, use a strong step stool that has a grab bar.  Keep electrical cords out of the way.  Do not use floor polish or wax that makes floors slippery. If you must use wax, use non-skid floor wax.  Do not have throw rugs and other things on  the floor that can make you trip. What can I do with my stairs?  Do not leave any items on the stairs.  Make sure that there are handrails on both sides of the stairs and use them. Fix handrails that are broken or loose. Make sure that handrails are as long as the stairways.  Check any carpeting to make sure that it is firmly attached to the stairs. Fix any carpet that is loose or worn.  Avoid having throw rugs at the top or bottom of the stairs. If you do have throw rugs, attach them to the floor with carpet tape.  Make sure that you have a light switch at the top of the stairs and the bottom of the stairs. If you do not have them, ask someone to add them for you. What else can I do to help prevent falls?  Wear shoes that:  Do not have high heels.  Have rubber bottoms.  Are comfortable and fit you well.  Are closed at the toe. Do not wear sandals.  If you use a stepladder:  Make sure that it is fully opened. Do not climb a closed  stepladder.  Make sure that both sides of the stepladder are locked into place.  Ask someone to hold it for you, if possible.  Clearly mark and make sure that you can see:  Any grab bars or handrails.  First and last steps.  Where the edge of each step is.  Use tools that help you move around (mobility aids) if they are needed. These include:  Canes.  Walkers.  Scooters.  Crutches.  Turn on the lights when you go into a dark area. Replace any light bulbs as soon as they burn out.  Set up your furniture so you have a clear path. Avoid moving your furniture around.  If any of your floors are uneven, fix them.  If there are any pets around you, be aware of where they are.  Review your medicines with your doctor. Some medicines can make you feel dizzy. This can increase your chance of falling. Ask your doctor what other things that you can do to help prevent falls. This information is not intended to replace advice given to you by your health care provider. Make sure you discuss any questions you have with your health care provider. Document Released: 04/21/2009 Document Revised: 12/01/2015 Document Reviewed: 07/30/2014 Elsevier Interactive Patient Education  2017 [employer]   Smoking Tobacco Information Smoking tobacco will very likely harm your health. Tobacco contains a poisonous (toxic), colorless chemical called nicotine. Nicotine affects the brain and makes tobacco addictive. This change in your brain can make it hard to stop smoking. Tobacco also has other toxic chemicals that can hurt your body and raise your risk of many cancers. How can smoking tobacco affect me? Smoking tobacco can increase your chances of having serious health conditions, such as: Cancer. Smoking is most commonly associated with lung cancer, but can lead to cancer in other parts of the body. Chronic obstructive pulmonary disease (COPD). This is a long-term lung condition that makes it hard to  breathe. It also gets worse over time. High blood pressure (hypertension), heart disease, stroke, or heart attack. Lung infections, such as pneumonia. Cataracts. This is when the lenses in the eyes become clouded. Digestive problems. This may include peptic ulcers, heartburn, and gastroesophageal reflux disease (GERD). Oral health problems, such as gum disease and tooth loss. Loss of taste and smell.  Smoking can  affect your appearance by causing: Wrinkles. Yellow or stained teeth, fingers, and fingernails.  Smoking tobacco can also affect your social life. Many workplaces, [employer], hotels, and public places are tobacco-free. This means that you may experience challenges in finding places to smoke when away from home. The cost of a smoking habit can be expensive. Expenses for someone who smokes come in two ways: You spend money on a regular basis to buy tobacco. Your health care costs in the long-term are higher if you smoke. Tobacco smoke can also affect the health of those around you. Children of smokers have greater chances of: Sudden infant death syndrome (SIDS). Ear infections. Lung infections.  What lifestyle changes can be made? Do not start smoking. Quit if you already do. To quit smoking: Make a plan to quit smoking and commit yourself to it. Look for programs to help you and ask your health care provider for recommendations and ideas. Talk with your health care provider about using nicotine replacement medicines to help you quit. Medicine replacement medicines include gum, lozenges, patches, sprays, or pills. Do not replace cigarette smoking with electronic cigarettes, which are commonly called e-cigarettes. The safety of e-cigarettes is not known, and some may contain harmful chemicals. Avoid places, people, or situations that tempt you to smoke. If you try to quit but return to smoking, don't give up hope. It is very common for people to try a number of times before they  fully succeed. When you feel ready again, give it another try. Quitting smoking might affect the way you eat as well as your weight. Be prepared to monitor your eating habits. Get support in planning and following a healthy diet. Ask your health care provider about having regular tests (screenings) to check for cancer. This may include blood tests, imaging tests, and other tests. Exercise regularly. Consider taking walks, joining a gym, or doing yoga or exercise classes. Develop skills to manage your stress. These skills include meditation. What are the benefits of quitting smoking? By quitting smoking, you may: Lower your risk of getting cancer and other diseases caused by smoking. Live longer. Breathe better. Lower your blood pressure and heart rate. Stop your addiction to tobacco. Stop creating secondhand smoke that hurts other people. Improve your sense of taste and smell. Look better over time, due to having fewer wrinkles and less staining.  What can happen if changes are not made? If you do not stop smoking, you may: Get cancer and other diseases. Develop COPD or other long-term (chronic) lung conditions. Develop serious problems with your heart and blood vessels (cardiovascular system). Need more tests to screen for problems caused by smoking. Have higher, long-term healthcare costs from medicines or treatments related to smoking. Continue to have worsening changes in your lungs, mouth, and nose.  Where to find support: To get support to quit smoking, consider: Asking your health care provider for more information and resources. Taking classes to learn more about quitting smoking. Looking for local organizations that offer resources about quitting smoking. Joining a support group for people who want to quit smoking in your local community.  Where to find more information: You may find more information about quitting smoking from: HelpGuide.org:  www.helpguide.org/articles/addictions/how-to-quit-smoking.htm https://hall.com/: smokefree.gov American Lung Association: www.lung.org  Contact a health care provider if: You have problems breathing. Your lips, nose, or fingers turn blue. You have chest pain. You are coughing up blood. You feel faint or you pass out. You have other noticeable changes that cause you to  worry. Summary Smoking tobacco can negatively affect your health, the health of those around you, your finances, and your social life. Do not start smoking. Quit if you already do. If you need help quitting, ask your health care provider. Think about joining a support group for people who want to quit smoking in your local community. There are many effective programs that will help you to quit this behavior. This information is not intended to replace advice given to you by your health care provider. Make sure you discuss any questions you have with your health care provider. Document Released: 07/10/2016 Document Revised: 07/10/2016 Document Reviewed: 07/10/2016 Elsevier Interactive Patient Education  [employer].

## 2017-10-17 NOTE — Progress Notes (Signed)
Subjective:   Aaron [name] is a 62 y.o. male who presents for Medicare Annual/Subsequent preventive examination.  Review of Systems:  N/A Cardiac Risk Factors include: advanced age (>25men, >51 women);dyslipidemia;male gender;hypertension;smoking/ tobacco exposure     Objective:    Vitals: BP 106/60 (BP Location: Right Arm, Patient Position: Sitting, Cuff Size: Normal)   Pulse 71   Temp 98.6 F (37 C) (Oral)   Ht 6\' 1"  (1.854 m)   Wt 198 lb 3.2 oz (89.9 kg)   SpO2 93%   BMI 26.15 kg/m   Body mass index is 26.15 kg/m.  Advanced Directives 10/17/2017 09/16/2017 09/16/2017 01/12/2017 01/12/2017 07/13/2016 06/12/2016  Does Patient Have a Medical Advance Directive? No No No No No No No  Would patient like information on creating a medical advance directive? Yes (MAU/Ambulatory/Procedural Areas - Information given) No - Patient declined No - Patient declined Yes (Inpatient - patient requests chaplain consult to create a medical advance directive) - - No - Patient declined    Tobacco Social History   Tobacco Use  Smoking Status Current Every Day Smoker  . Types: Cigars  Smokeless Tobacco Never Used     Ready to quit: No Counseling given: Yes   Clinical Intake:  Pre-visit preparation completed: Yes  Pain : No/denies pain   BMI - recorded: 24.41 Nutritional Status: BMI of 19-24  Normal Nutritional Risks: None Diabetes: No  How often do you need to have someone help you when you read instructions, pamphlets, or other written materials from your doctor or pharmacy?: 1 - Never  Interpreter Needed?: No  Information entered by :: AEversole, LPN  Past Medical History:  Diagnosis Date  . Coronary artery disease   . Gout   . Hx of heart artery stent 01/12/2017  . Hypercholesteremia   . Hypertension   . Vertigo    Past Surgical History:  Procedure Laterality Date  . APPENDECTOMY    . cardiac stents   2012   Family History  Problem Relation Age of Onset  .  Hypertension Mother   . Diabetes Mother   . Heart failure Father        age 12  . Cancer Father   . Ovarian cancer Sister   . Heart disease Brother    Social History   Socioeconomic History  . Marital status: Single    Spouse name: Not on file  . Number of children: 0  . Years of education: Not on file  . Highest education level: 12th grade  Occupational History  . Occupation: Retired/Disabled  Social Needs  . Financial resource strain: Not hard at all  . Food insecurity:    Worry: Never true    Inability: Never true  . Transportation needs:    Medical: No    Non-medical: No  Tobacco Use  . Smoking status: Current Every Day Smoker    Types: Cigars  . Smokeless tobacco: Never Used  Substance and Sexual Activity  . Alcohol use: Not Currently    Alcohol/week: 0.6 oz    Types: 1 Cans of beer per week  . Drug use: No  . Sexual activity: Not Currently    Birth control/protection: None  Lifestyle  . Physical activity:    Days per week: 7 days    Minutes per session: 40 min  . Stress: Not at all  Relationships  . Social connections:    Talks on phone: Patient refused    Gets together: Patient refused    Attends  religious service: Patient refused    Active member of club or organization: Patient refused    Attends meetings of clubs or organizations: Patient refused    Relationship status: Patient refused  Other Topics Concern  . Not on file  Social History Narrative  . Not on file    Outpatient Encounter Medications as of 10/17/2017  Medication Sig  . aspirin 325 MG EC tablet Take 1 tablet by mouth daily.  . cilostazol (PLETAL) 50 MG tablet Take 1 tablet by mouth 2 (two) times daily.   . clopidogrel (PLAVIX) 75 MG tablet Take 1 tablet by mouth daily.  . indomethacin (INDOCIN) 25 MG capsule TAKE 1 CAPSULE TWICE A DAY AS NEEDED  . isosorbide mononitrate (IMDUR) 60 MG 24 hr tablet Take 90 mg by mouth daily.   [name] lisinopril (PRINIVIL,ZESTRIL) 10 MG tablet Take 1 tablet  by mouth daily.  [name] loperamide (IMODIUM A-D) 2 MG tablet Take 1 tablet (2 mg total) by mouth 4 (four) times daily as needed for diarrhea or loose stools.  . meclizine (ANTIVERT) 25 MG tablet Take 1 tablet (25 mg total) by mouth 3 (three) times daily as needed for dizziness.  [name] METOPROLOL TARTRATE PO Take by mouth. Take 50MG  by mouth every morning and 75MG  every evening  . nitroGLYCERIN (NITROSTAT) 0.4 MG SL tablet Place 1 tablet under the tongue as needed.  . simvastatin (ZOCOR) 40 MG tablet Take 1 tablet (40 mg total) by mouth at bedtime.   No facility-administered encounter medications on file as of 10/17/2017.     Activities of Daily Living In your present state of health, do you have any difficulty performing the following activities: 10/17/2017 09/16/2017  Hearing? N N  Comment denies hearing aids -  Vision? N N  Comment Wears eyeglasses -  Difficulty concentrating or making decisions? N N  Walking or climbing stairs? Y N  Comment knee pain -  Dressing or bathing? N N  Doing errands, shopping? N N  Preparing Food and eating ? N -  Comment denies dentures -  Using the Toilet? N -  In the past six months, have you accidently leaked urine? N -  Do you have problems with loss of bowel control? N -  Managing your Medications? N -  Managing your Finances? N -  Housekeeping or managing your Housekeeping? N -  Some recent data might be hidden    Patient Care Team: Glean Hess, MD as PCP - General (Family Medicine) Isaias Cowman, MD as PCP - Cardiology (Cardiology)   Assessment:   This is a routine wellness examination for Aaron [name].  Exercise Activities and Dietary recommendations Current Exercise Habits: Home exercise routine, Type of exercise: walking, Time (Minutes): 45, Frequency (Times/Week): 7, Weekly Exercise (Minutes/Week): 315, Intensity: Mild, Exercise limited by: None identified  Goals    . DIET - INCREASE WATER INTAKE     Recommend to drink at least 6-8 8oz  glasses of water per day.       Fall Risk Fall Risk  10/17/2017  Falls in the past year? Yes  Number falls in past yr: 1  Injury with Fall? No  Comment went to bathroom and lost consciousness  Risk for fall due to : Impaired balance/gait;Impaired vision  Follow up Falls evaluation completed;Education provided;Falls prevention discussed    Is the home free of loose throw rugs in walkways, pet beds, electrical cords, etc? Yes Adequate lighting to reduce risk of falls?  Yes In addition, does the patient  have any of the following: Stairs in or around the home WITH handrails? No Grab bars in the bathroom? No  Shower chair or a place to sit while bathing? No Use of an elevated toilet seat or a handicapped toilet? No Use of a cane, walker or w/c? Yes, cane and walker  Timed Get Up and Go Performed: Yes. Pt ambulated 10 feet within 12 sec. Gait slow, steady and with the use of an assistive device. No intervention required at this time. Fall risk prevention has been discussed.  Pt declined my offer to send Community Resource Referral to Care Guide for installation of grab bars in the shower, shower chair or an elevated toilet seat.  Depression Screen PHQ 2/9 Scores 10/17/2017 10/17/2017 04/02/2017  PHQ - 2 Score 0 0 0  PHQ- 9 Score 0 - -    Cognitive Function     6CIT Screen 10/17/2017  What Year? 0 points  What month? 0 points  What time? 0 points  Count back from 20 0 points  Months in reverse 0 points  Repeat phrase 2 points  Total Score 2    Immunization History  Administered Date(s) Administered  . Influenza,inj,Quad PF,6+ Mos 05/19/2015, 04/06/2016, 04/02/2017  . Pneumococcal Polysaccharide-23 11/14/2012, 07/03/2014  . Tdap 12/18/2013    Qualifies for Shingles Vaccine? No  Screening Tests Health Maintenance  Topic Date Due  . INFLUENZA VACCINE  02/06/2018  . COLONOSCOPY  03/30/2018  . TETANUS/TDAP  12/19/2023  . Hepatitis C Screening  Completed  . HIV Screening   Completed   Cancer Screenings: Lung: Low Dose CT Chest recommended if Age 33-80 years, 30 pack-year currently smoking OR have quit w/in 15years. Patient does not qualify. Colorectal: Completed colonoscopy 03/30/13. Repeat every 5 years  Additional Screenings: Hepatitis C Screening: Completed 01/12/17     Plan:  I have personally reviewed and addressed the Medicare Annual Wellness questionnaire and have noted the following in the patient's chart:  A. Medical and social history B. Use of alcohol, tobacco or illicit drugs  C. Current medications and supplements D. Functional ability and status E.  Nutritional status F.  Physical activity G. Advance directives H. List of other physicians I.  Hospitalizations, surgeries, and ER visits in previous 12 months J.  [place] such as hearing and vision if needed, cognitive and depression L. Referrals and appointments  In addition, I have reviewed and discussed with patient certain preventive protocols, quality metrics, and best practice recommendations. A written personalized care plan for preventive services as well as general preventive health recommendations were provided to patient.  Signed,  Aleatha Borer, LPN Nurse Health Advisor  MD Recommendations: Colorectal Screening: Completed colonoscopy 03/30/13. Repeat every 5 years. Will be due on or after 03/30/18 for repeat screening colonoscopy

## 2017-10-23 ENCOUNTER — Encounter: Payer: Self-pay | Admitting: Internal Medicine

## 2017-10-23 ENCOUNTER — Ambulatory Visit (INDEPENDENT_AMBULATORY_CARE_PROVIDER_SITE_OTHER): Payer: PPO | Admitting: Internal Medicine

## 2017-10-23 VITALS — BP 110/60 | HR 66 | Ht 73.0 in | Wt 198.0 lb

## 2017-10-23 DIAGNOSIS — F17200 Nicotine dependence, unspecified, uncomplicated: Secondary | ICD-10-CM | POA: Diagnosis not present

## 2017-10-23 DIAGNOSIS — E782 Mixed hyperlipidemia: Secondary | ICD-10-CM | POA: Diagnosis not present

## 2017-10-23 DIAGNOSIS — R739 Hyperglycemia, unspecified: Secondary | ICD-10-CM | POA: Diagnosis not present

## 2017-10-23 DIAGNOSIS — I739 Peripheral vascular disease, unspecified: Secondary | ICD-10-CM

## 2017-10-23 DIAGNOSIS — S82892A Other fracture of left lower leg, initial encounter for closed fracture: Secondary | ICD-10-CM | POA: Insufficient documentation

## 2017-10-23 DIAGNOSIS — Z0000 Encounter for general adult medical examination without abnormal findings: Secondary | ICD-10-CM | POA: Diagnosis not present

## 2017-10-23 DIAGNOSIS — I10 Essential (primary) hypertension: Secondary | ICD-10-CM | POA: Diagnosis not present

## 2017-10-23 DIAGNOSIS — I2510 Atherosclerotic heart disease of native coronary artery without angina pectoris: Secondary | ICD-10-CM | POA: Diagnosis not present

## 2017-10-23 DIAGNOSIS — Z125 Encounter for screening for malignant neoplasm of prostate: Secondary | ICD-10-CM | POA: Diagnosis not present

## 2017-10-23 DIAGNOSIS — M109 Gout, unspecified: Secondary | ICD-10-CM | POA: Diagnosis not present

## 2017-10-23 DIAGNOSIS — Z1211 Encounter for screening for malignant neoplasm of colon: Secondary | ICD-10-CM

## 2017-10-23 HISTORY — DX: Other fracture of left lower leg, initial encounter for closed fracture: S82.892A

## 2017-10-23 LAB — POCT URINALYSIS DIPSTICK
Bilirubin, UA: NEGATIVE
GLUCOSE UA: NEGATIVE
KETONES UA: NEGATIVE
LEUKOCYTES UA: NEGATIVE
Nitrite, UA: NEGATIVE
Protein, UA: NEGATIVE
SPEC GRAV UA: 1.015 (ref 1.010–1.025)
Urobilinogen, UA: 0.2 E.U./dL
pH, UA: 5 (ref 5.0–8.0)

## 2017-10-23 MED ORDER — VARENICLINE TARTRATE 0.5 MG X 11 & 1 MG X 42 PO MISC: ORAL | 0 refills | Status: DC

## 2017-10-23 NOTE — Patient Instructions (Signed)
Cut nighttime metoprolol back to 50 mg to see if lightheaded symptoms improve Health Maintenance, Male A healthy lifestyle and preventive care is important for your health and wellness. Ask your health care provider about what schedule of regular examinations is right for you. What should I know about weight and diet? Eat a Healthy Diet  Eat plenty of vegetables, fruits, whole grains, low-fat dairy products, and lean protein.  Do not eat a lot of foods high in solid fats, added sugars, or salt.  Maintain a Healthy Weight Regular exercise can help you achieve or maintain a healthy weight. You should:  Do at least 150 minutes of exercise each week. The exercise should increase your heart rate and make you sweat (moderate-intensity exercise).  Do strength-training exercises at least twice a week.  Watch Your Levels of Cholesterol and Blood Lipids  Have your blood tested for lipids and cholesterol every 5 years starting at 62 years of age. If you are at high risk for heart disease, you should start having your blood tested when you are 62 years old. You may need to have your cholesterol levels checked more often if: ? Your lipid or cholesterol levels are high. ? You are older than 62 years of age. ? You are at high risk for heart disease.  What should I know about cancer screening? Many types of cancers can be detected early and may often be prevented. Lung Cancer  You should be screened every year for lung cancer if: ? You are a current smoker who has smoked for at least 30 years. ? You are a former smoker who has quit within the past 15 years.  Talk to your health care provider about your screening options, when you should start screening, and how often you should be screened.  Colorectal Cancer  Routine colorectal cancer screening usually begins at 62 years of age and should be repeated every 5-10 years until you are 62 years old. You may need to be screened more often if early  forms of precancerous polyps or small growths are found. Your health care provider may recommend screening at an earlier age if you have risk factors for colon cancer.  Your health care provider may recommend using home test kits to check for hidden blood in the stool.  A small camera at the end of a tube can be used to examine your colon (sigmoidoscopy or colonoscopy). This checks for the earliest forms of colorectal cancer.  Prostate and Testicular Cancer  Depending on your age and overall health, your health care provider may do certain tests to screen for prostate and testicular cancer.  Talk to your health care provider about any symptoms or concerns you have about testicular or prostate cancer.  Skin Cancer  Check your skin from head to toe regularly.  Tell your health care provider about any new moles or changes in moles, especially if: ? There is a change in a mole's size, shape, or color. ? You have a mole that is larger than a pencil eraser.  Always use sunscreen. Apply sunscreen liberally and repeat throughout the day.  Protect yourself by wearing long sleeves, pants, a wide-brimmed hat, and sunglasses when outside.  What should I know about heart disease, diabetes, and high blood pressure?  If you are 53-31 years of age, have your blood pressure checked every 3-5 years. If you are 93 years of age or older, have your blood pressure checked every year. You should have your blood pressure  measured twice-once when you are at a hospital or clinic, and once when you are not at a hospital or clinic. Record the average of the two measurements. To check your blood pressure when you are not at a hospital or clinic, you can use: ? An automated blood pressure machine at a pharmacy. ? A home blood pressure monitor.  Talk to your health care provider about your target blood pressure.  If you are between 62-80 years old, ask your health care provider if you should take aspirin to prevent  heart disease.  Have regular diabetes screenings by checking your fasting blood sugar level. ? If you are at a normal weight and have a low risk for diabetes, have this test once every three years after the age of 85. ? If you are overweight and have a high risk for diabetes, consider being tested at a younger age or more often.  A one-time screening for abdominal aortic aneurysm (AAA) by ultrasound is recommended for men aged 71-75 years who are current or former smokers. What should I know about preventing infection? Hepatitis B If you have a higher risk for hepatitis B, you should be screened for this virus. Talk with your health care provider to find out if you are at risk for hepatitis B infection. Hepatitis C Blood testing is recommended for:  Everyone born from [birth year] through 1965.  Anyone with known risk factors for hepatitis C.  Sexually Transmitted Diseases (STDs)  You should be screened each year for STDs including gonorrhea and chlamydia if: ? You are sexually active and are younger than 62 years of age. ? You are older than 62 years of age and your health care provider tells you that you are at risk for this type of infection. ? Your sexual activity has changed since you were last screened and you are at an increased risk for chlamydia or gonorrhea. Ask your health care provider if you are at risk.  Talk with your health care provider about whether you are at high risk of being infected with HIV. Your health care provider may recommend a prescription medicine to help prevent HIV infection.  What else can I do?  Schedule regular health, dental, and eye exams.  Stay current with your vaccines (immunizations).  Do not use any tobacco products, such as cigarettes, chewing tobacco, and e-cigarettes. If you need help quitting, ask your health care provider.  Limit alcohol intake to no more than 2 drinks per day. One drink equals 12 ounces of beer, 5 ounces of wine, or 1 ounces  of hard liquor.  Do not use street drugs.  Do not share needles.  Ask your health care provider for help if you need support or information about quitting drugs.  Tell your health care provider if you often feel depressed.  Tell your health care provider if you have ever been abused or do not feel safe at home. This information is not intended to replace advice given to you by your health care provider. Make sure you discuss any questions you have with your health care provider. Document Released: 12/22/2007 Document Revised: 02/22/2016 Document Reviewed: 03/29/2015 Elsevier Interactive Patient Education  [employer].

## 2017-10-23 NOTE — Progress Notes (Signed)
Date:  10/23/2017   Name:  Aaron [name]   DOB:  1956-05-14   MRN:  [id number]   Chief Complaint: Annual Exam Aaron [name] is a 62 y.o. male who presents today for his Complete Annual Exam. He feels fairly well. He reports exercising none since ankle fracture. He reports he is sleeping well.  He is due for 5 year colonoscopy with [facility].  Hypertension  This is a chronic problem. The problem is unchanged. The problem is controlled. Pertinent negatives include no chest pain, headaches, palpitations or shortness of breath. Past treatments include calcium channel blockers, ACE inhibitors and direct vasodilators. There are no compliance problems.  Hypertensive end-organ damage includes CAD/MI and PVD.  Hyperlipidemia  This is a chronic problem. Pertinent negatives include no chest pain, myalgias or shortness of breath. Current antihyperlipidemic treatment includes statins. The current treatment provides significant improvement of lipids.  Heart Problem  This is a chronic problem. Progression since onset: currently no chest pain with activity. Associated symptoms include arthralgias. Pertinent negatives include no abdominal pain, chest pain, chills, diaphoresis, fatigue, headaches, myalgias or rash.  Nicotine Dependence  Presents for follow-up (he tried chantix briefly in the past but just did not continue it.) visit. Symptoms are negative for fatigue. His urge triggers include company of smokers. The symptoms have been stable. He smokes 1 pack of cigarettes per day.  Ankle Fracture - last month he was in the ED and kept overnight after a syncopal episode.  He appeared dehydrated, he had been drinking the night before.  He also sustained an avulsion fracture of his left ankle.  He has been using an ACE wrap.  He was seen by Dr. Rudene Christians.  Still having some ankle and knee pain - more on the right ankle which was not imaged.  He is trying to drink more water now.    Review of Systems    Constitutional: Negative for appetite change, chills, diaphoresis, fatigue and unexpected weight change.  HENT: Negative for hearing loss, tinnitus, trouble swallowing and voice change.   Eyes: Negative for visual disturbance.  Respiratory: Negative for choking, shortness of breath and wheezing.   Cardiovascular: Negative for chest pain, palpitations and leg swelling.  Gastrointestinal: Negative for abdominal pain, blood in stool, constipation and diarrhea.  Genitourinary: Negative for difficulty urinating, dysuria, frequency, hematuria, testicular pain and urgency.  Musculoskeletal: Positive for arthralgias. Negative for back pain and myalgias.  Skin: Negative for color change and rash.  Neurological: Positive for light-headedness. Negative for dizziness, syncope and headaches.  Hematological: Negative for adenopathy.  Psychiatric/Behavioral: Negative for dysphoric mood and sleep disturbance. The patient is not nervous/anxious.     Patient Active Problem List   Diagnosis Date Noted  . Syncope 09/16/2017  . Pain in limb 07/13/2016  . Tobacco use disorder 07/13/2016  . Elevated blood sugar 07/27/2015  . Controlled gout 12/22/2014  . CAD in native artery 10/09/2014  . Essential (primary) hypertension 10/09/2014  . Dupuytren's disease of palm 10/09/2014  . Psoriasis 10/09/2014  . Pain in shoulder 10/09/2014  . H/O cardiac catheterization 01/27/2014  . HLD (hyperlipidemia) 01/27/2014  . Peripheral vascular disease ([place]) 01/27/2014    Prior to Admission medications   Medication Sig Start Date End Date Taking? Authorizing Provider  aspirin 325 MG EC tablet Take 1 tablet by mouth daily.    [provider]  cilostazol (PLETAL) 50 MG tablet Take 1 tablet by mouth 2 (two) times daily.     [provider]  clopidogrel (PLAVIX) 75 MG tablet Take 1 tablet by mouth daily.    [provider]  indomethacin (INDOCIN) 25 MG capsule TAKE 1 CAPSULE TWICE A DAY AS NEEDED  10/05/17   Glean Hess, MD  isosorbide mononitrate (IMDUR) 60 MG 24 hr tablet Take 90 mg by mouth daily.  09/04/16   [provider]  lisinopril (PRINIVIL,ZESTRIL) 10 MG tablet Take 1 tablet by mouth daily.    [provider]  loperamide (IMODIUM A-D) 2 MG tablet Take 1 tablet (2 mg total) by mouth 4 (four) times daily as needed for diarrhea or loose stools. 09/17/17   Henreitta Leber, MD  meclizine (ANTIVERT) 25 MG tablet Take 1 tablet (25 mg total) by mouth 3 (three) times daily as needed for dizziness. 06/28/17   Glean Hess, MD  METOPROLOL TARTRATE PO Take by mouth. Take 50MG  by mouth every morning and 75MG  every evening    [provider]  nitroGLYCERIN (NITROSTAT) 0.4 MG SL tablet Place 1 tablet under the tongue as needed.    [provider]  simvastatin (ZOCOR) 40 MG tablet Take 1 tablet (40 mg total) by mouth at bedtime. 05/21/15   Glean Hess, MD    No Known Allergies  Past Surgical History:  Procedure Laterality Date  . APPENDECTOMY    . cardiac stents   2012    Social History   Tobacco Use  . Smoking status: Current Every Day Smoker    Types: Cigars  . Smokeless tobacco: Never Used  Substance Use Topics  . Alcohol use: Not Currently    Alcohol/week: 0.6 oz    Types: 1 Cans of beer per week  . Drug use: No     Medication list has been reviewed and updated.  PHQ 2/9 Scores 10/17/2017 10/17/2017 04/02/2017  PHQ - 2 Score 0 0 0  PHQ- 9 Score 0 - -    Physical Exam  Constitutional: He is oriented to person, place, and time. He appears well-developed and well-nourished.  HENT:  Head: Normocephalic.  Right Ear: Tympanic membrane, external ear and ear canal normal.  Left Ear: Tympanic membrane, external ear and ear canal normal.  Nose: Nose normal.  Mouth/Throat: Uvula is midline and oropharynx is clear and moist.  Eyes: Pupils are equal, round, and reactive to light. Conjunctivae and EOM are normal.  Neck: Normal  range of motion. Neck supple. Carotid bruit is not present. No thyromegaly present.  Cardiovascular: Normal rate, regular rhythm, normal heart sounds and intact distal pulses.  Pulses:      Dorsalis pedis pulses are 1+ on the right side, and 1+ on the left side.       Posterior tibial pulses are 0 on the right side, and 0 on the left side.  Pulmonary/Chest: Effort normal and breath sounds normal. He has no wheezes. Right breast exhibits no mass. Left breast exhibits no mass.  Abdominal: Soft. Normal appearance and bowel sounds are normal. There is no hepatosplenomegaly. There is no tenderness.  Musculoskeletal:       Right ankle: He exhibits decreased range of motion. He exhibits no swelling and no ecchymosis.       Left ankle: He exhibits decreased range of motion. He exhibits no swelling and no ecchymosis.  Lymphadenopathy:    He has no cervical adenopathy.  Neurological: He is alert and oriented to person, place, and time. He has normal reflexes.  Skin: Skin is warm, dry and intact.  Psychiatric: He  has a normal mood and affect. His speech is normal and behavior is normal. Judgment and thought content normal.  Nursing note and vitals reviewed.   BP 110/60   Pulse 66   Ht 6\' 1"  (1.854 m)   Wt 198 lb (89.8 kg)   SpO2 99%   BMI 26.12 kg/m   Assessment and Plan: 1. Annual physical exam UA dip + blood but micro negative - POCT urinalysis dipstick  2. Prostate cancer screening DRE deferred - PSA  3. Essential (primary) hypertension Controlled May be slightly over treated - cut back to metoprolol 50 mg bid - CBC with Differential/Platelet  4. CAD in native artery Stable; continue regular cardiology follow up - Lipid panel  5. Peripheral vascular disease (HCC) Stable, pulses decreased but palpable Continue Pletal  6. Compulsive tobacco user syndrome Recommend Chantix -advised he can take it for up to 6 months if needed - varenicline (CHANTIX STARTING MONTH PAK) 0.5 MG X  11 & 1 MG X 42 tablet; Take one 0.5 mg tablet by mouth once daily for 3 days, then increase to one 0.5 mg tablet twice daily for 4 days, then increase to one 1 mg tablet twice daily.  Dispense: 53 tablet; Refill: 0  7. Elevated blood sugar - Comprehensive metabolic panel - Hemoglobin A1c  8. Mixed hyperlipidemia On zocor - Lipid panel  9. Controlled gout No recent episodes - Uric acid  10. Fracture of left ankle, closed, initial encounter Recommend follow up with Orthopedics  11. Colon cancer screening Done 5 years ago by [facility] - Ambulatory referral to Gastroenterology   Meds ordered this encounter  Medications  . varenicline (CHANTIX STARTING MONTH PAK) 0.5 MG X 11 & 1 MG X 42 tablet    Sig: Take one 0.5 mg tablet by mouth once daily for 3 days, then increase to one 0.5 mg tablet twice daily for 4 days, then increase to one 1 mg tablet twice daily.    Dispense:  53 tablet    Refill:  0    Partially dictated using [employer]. Any errors are unintentional.  Halina Maidens, MD [facility]  10/23/2017

## 2017-10-24 LAB — COMPREHENSIVE METABOLIC PANEL WITH GFR
ALT: 29 IU/L (ref 0–44)
AST: 20 IU/L (ref 0–40)
Albumin/Globulin Ratio: 1.6 (ref 1.2–2.2)
Albumin: 4.6 g/dL (ref 3.6–4.8)
Alkaline Phosphatase: 86 IU/L (ref 39–117)
BUN/Creatinine Ratio: 19 (ref 10–24)
BUN: 17 mg/dL (ref 8–27)
Bilirubin Total: 0.3 mg/dL (ref 0.0–1.2)
CO2: 22 mmol/L (ref 20–29)
Calcium: 9.8 mg/dL (ref 8.6–10.2)
Chloride: 95 mmol/L — ABNORMAL LOW (ref 96–106)
Creatinine, Ser: 0.89 mg/dL (ref 0.76–1.27)
GFR calc Af Amer: 107 mL/min/1.73 (ref >59)
GFR calc non Af Amer: 92 mL/min/1.73 (ref >59)
Globulin, Total: 2.8 g/dL (ref 1.5–4.5)
Glucose: 85 mg/dL (ref 65–99)
Potassium: 5.2 mmol/L (ref 3.5–5.2)
Sodium: 136 mmol/L (ref 134–144)
Total Protein: 7.4 g/dL (ref 6.0–8.5)

## 2017-10-24 LAB — LIPID PANEL
Chol/HDL Ratio: 4.4 ratio (ref 0.0–5.0)
Cholesterol, Total: 140 mg/dL (ref 100–199)
HDL: 32 mg/dL — ABNORMAL LOW (ref >39)
LDL Calculated: 81 mg/dL (ref 0–99)
TRIGLYCERIDES: 135 mg/dL (ref 0–149)
VLDL CHOLESTEROL CAL: 27 mg/dL (ref 5–40)

## 2017-10-24 LAB — CBC WITH DIFFERENTIAL/PLATELET
Basophils Absolute: 0 x10E3/uL (ref 0.0–0.2)
Basos: 0 %
EOS (ABSOLUTE): 0.1 x10E3/uL (ref 0.0–0.4)
Eos: 2 %
Hematocrit: 50.1 % (ref 37.5–51.0)
Hemoglobin: 17.2 g/dL (ref 13.0–17.7)
Immature Grans (Abs): 0 x10E3/uL (ref 0.0–0.1)
Immature Granulocytes: 0 %
Lymphocytes Absolute: 2.4 x10E3/uL (ref 0.7–3.1)
Lymphs: 32 %
MCH: 32.5 pg (ref 26.6–33.0)
MCHC: 34.3 g/dL (ref 31.5–35.7)
MCV: 95 fL (ref 79–97)
Monocytes Absolute: 0.5 x10E3/uL (ref 0.1–0.9)
Monocytes: 6 %
Neutrophils Absolute: 4.5 x10E3/uL (ref 1.4–7.0)
Neutrophils: 60 %
Platelets: 211 x10E3/uL (ref 150–379)
RBC: 5.3 x10E6/uL (ref 4.14–5.80)
RDW: 14.6 % (ref 12.3–15.4)
WBC: 7.5 x10E3/uL (ref 3.4–10.8)

## 2017-10-24 LAB — HEMOGLOBIN A1C
Est. average glucose Bld gHb Est-mCnc: 123 mg/dL
Hgb A1c MFr Bld: 5.9 % — ABNORMAL HIGH (ref 4.8–5.6)

## 2017-10-24 LAB — URIC ACID: Uric Acid: 7.4 mg/dL (ref 3.7–8.6)

## 2017-10-24 LAB — PSA: Prostate Specific Ag, Serum: 1.3 ng/mL (ref 0.0–4.0)

## 2017-11-20 DIAGNOSIS — M170 Bilateral primary osteoarthritis of knee: Secondary | ICD-10-CM | POA: Diagnosis not present

## 2017-11-20 DIAGNOSIS — S93411D Sprain of calcaneofibular ligament of right ankle, subsequent encounter: Secondary | ICD-10-CM | POA: Diagnosis not present

## 2018-02-13 DIAGNOSIS — I10 Essential (primary) hypertension: Secondary | ICD-10-CM | POA: Diagnosis not present

## 2018-02-13 DIAGNOSIS — I2510 Atherosclerotic heart disease of native coronary artery without angina pectoris: Secondary | ICD-10-CM | POA: Diagnosis not present

## 2018-02-13 DIAGNOSIS — R079 Chest pain, unspecified: Secondary | ICD-10-CM | POA: Diagnosis not present

## 2018-02-13 DIAGNOSIS — I739 Peripheral vascular disease, unspecified: Secondary | ICD-10-CM | POA: Diagnosis not present

## 2018-02-13 DIAGNOSIS — E785 Hyperlipidemia, unspecified: Secondary | ICD-10-CM | POA: Diagnosis not present

## 2018-02-13 DIAGNOSIS — Z9889 Other specified postprocedural states: Secondary | ICD-10-CM | POA: Diagnosis not present

## 2018-02-13 DIAGNOSIS — I200 Unstable angina: Secondary | ICD-10-CM | POA: Diagnosis not present

## 2018-03-24 ENCOUNTER — Ambulatory Visit (INDEPENDENT_AMBULATORY_CARE_PROVIDER_SITE_OTHER): Payer: PPO

## 2018-03-24 DIAGNOSIS — Z23 Encounter for immunization: Secondary | ICD-10-CM

## 2018-03-25 NOTE — Progress Notes (Deleted)
Patient came into office for nurse visit to receive flu shot only.

## 2018-04-02 ENCOUNTER — Ambulatory Visit (INDEPENDENT_AMBULATORY_CARE_PROVIDER_SITE_OTHER): Payer: PPO | Admitting: Internal Medicine

## 2018-04-02 ENCOUNTER — Encounter: Payer: Self-pay | Admitting: Internal Medicine

## 2018-04-02 VITALS — BP 130/72 | HR 73 | Temp 98.2°F | Ht 73.0 in | Wt 185.0 lb

## 2018-04-02 DIAGNOSIS — F17200 Nicotine dependence, unspecified, uncomplicated: Secondary | ICD-10-CM

## 2018-04-02 DIAGNOSIS — Z1211 Encounter for screening for malignant neoplasm of colon: Secondary | ICD-10-CM | POA: Diagnosis not present

## 2018-04-02 DIAGNOSIS — J40 Bronchitis, not specified as acute or chronic: Secondary | ICD-10-CM

## 2018-04-02 MED ORDER — DOXYCYCLINE HYCLATE 100 MG PO TABS: 100.0000 mg | ORAL_TABLET | Freq: Two times a day (BID) | ORAL | 0 refills | Status: AC

## 2018-04-02 MED ORDER — VARENICLINE TARTRATE 1 MG PO TABS: 1.0000 mg | ORAL_TABLET | Freq: Two times a day (BID) | ORAL | 4 refills | Status: DC

## 2018-04-02 MED ORDER — VARENICLINE TARTRATE 0.5 MG X 11 & 1 MG X 42 PO MISC: ORAL | 0 refills | Status: DC

## 2018-04-02 NOTE — Progress Notes (Signed)
Date:  04/02/2018   Name:  Aaron [name]   DOB:  07-30-1955   MRN:  [id number]   Chief Complaint: Cough (Started Sunday morning. Cough with fever and chills. Cough hurts on sides and into back. Clear production. ) and Nicotine Dependence (Need new Rx for Chantix- lost other Rx when written at previous appt. )  Cough  This is a new problem. The current episode started in the past 7 days. The problem occurs every few minutes. The cough is productive of sputum. Associated symptoms include shortness of breath and wheezing. Pertinent negatives include no chest pain, chills, fever or headaches. Risk factors for lung disease include smoking/tobacco exposure.  Nicotine Dependence  Symptoms are negative for fatigue. His urge triggers include company of smokers.    Review of Systems  Constitutional: Negative for chills, fatigue and fever.  Respiratory: Positive for cough, shortness of breath and wheezing. Negative for chest tightness.   Cardiovascular: Negative for chest pain and palpitations.  Gastrointestinal: Negative for abdominal pain, constipation and diarrhea.  Neurological: Negative for dizziness and headaches.    Patient Active Problem List   Diagnosis Date Noted  . Fracture of left ankle, closed, initial encounter 10/23/2017  . Syncope 09/16/2017  . Pain in limb 07/13/2016  . Tobacco use disorder 07/13/2016  . Elevated blood sugar 07/27/2015  . Controlled gout 12/22/2014  . CAD in native artery 10/09/2014  . Essential (primary) hypertension 10/09/2014  . Dupuytren's disease of palm 10/09/2014  . Psoriasis 10/09/2014  . Pain in shoulder 10/09/2014  . H/O cardiac catheterization 01/27/2014  . HLD (hyperlipidemia) 01/27/2014  . Peripheral vascular disease ([place]) 01/27/2014    No Known Allergies  Past Surgical History:  Procedure Laterality Date  . APPENDECTOMY    . cardiac stents   2012    Social History   Tobacco Use  . Smoking status: Current Every Day Smoker   Types: Cigars  . Smokeless tobacco: Never Used  Substance Use Topics  . Alcohol use: Not Currently    Alcohol/week: 1.0 standard drinks    Types: 1 Cans of beer per week  . Drug use: No     Medication list has been reviewed and updated.  Current Meds  Medication Sig  . aspirin 325 MG EC tablet Take 1 tablet by mouth daily.  . cilostazol (PLETAL) 50 MG tablet Take 1 tablet by mouth 2 (two) times daily.   . clopidogrel (PLAVIX) 75 MG tablet Take 1 tablet by mouth daily.  . indomethacin (INDOCIN) 25 MG capsule TAKE 1 CAPSULE TWICE A DAY AS NEEDED  . isosorbide mononitrate (IMDUR) 60 MG 24 hr tablet Take 90 mg by mouth daily.   [name] lisinopril (PRINIVIL,ZESTRIL) 10 MG tablet Take 1 tablet by mouth daily.  [name] loperamide (IMODIUM A-D) 2 MG tablet Take 1 tablet (2 mg total) by mouth 4 (four) times daily as needed for diarrhea or loose stools.  . meclizine (ANTIVERT) 25 MG tablet Take 1 tablet (25 mg total) by mouth 3 (three) times daily as needed for dizziness.  [name] METOPROLOL TARTRATE PO Take by mouth. Take 50MG  by mouth every morning and 75MG  every evening  . nitroGLYCERIN (NITROSTAT) 0.4 MG SL tablet Place 1 tablet under the tongue as needed.  . simvastatin (ZOCOR) 40 MG tablet Take 1 tablet (40 mg total) by mouth at bedtime.    PHQ 2/9 Scores 10/17/2017 10/17/2017 04/02/2017  PHQ - 2 Score 0 0 0  PHQ- 9 Score 0 - -  Physical Exam  Constitutional: He is oriented to person, place, and time. He appears well-developed. No distress.  HENT:  Head: Normocephalic and atraumatic.  Cardiovascular: Normal rate, regular rhythm and normal heart sounds.  Pulmonary/Chest: Effort normal. No respiratory distress. He has decreased breath sounds. He has no wheezes. He has no rhonchi.  Musculoskeletal: Normal range of motion.  Neurological: He is alert and oriented to person, place, and time.  Skin: Skin is warm and dry. No rash noted.  Psychiatric: He has a normal mood and affect. His behavior is normal.  Thought content normal.  Nursing note and vitals reviewed.   BP 130/72 (BP Location: Right Arm, Patient Position: Sitting, Cuff Size: Normal)   Pulse 73   Temp 98.2 F (36.8 C) (Oral)   Ht 6\' 1"  (1.854 m)   Wt 185 lb (83.9 kg)   SpO2 97%   BMI 24.41 kg/m   Assessment and Plan: 1. Bronchitis Continue robitussin Quit smoking - doxycycline (VIBRA-TABS) 100 MG tablet; Take 1 tablet (100 mg total) by mouth 2 (two) times daily for 10 days.  Dispense: 20 tablet; Refill: 0  2. Tobacco use disorder - varenicline (CHANTIX STARTING MONTH PAK) 0.5 MG X 11 & 1 MG X 42 tablet; Take one 0.5 mg tablet by mouth once daily for 3 days, then increase to one 0.5 mg tablet twice daily for 4 days, then increase to one 1 mg tablet twice daily.  Dispense: 53 tablet; Refill: 0 - varenicline (CHANTIX CONTINUING MONTH PAK) 1 MG tablet; Take 1 tablet (1 mg total) by mouth 2 (two) times daily.  Dispense: 60 tablet; Refill: 4  3. Colon cancer screening To reschedule exam in [place] with previous GI MD (pt does not know who but has the letter at home)  Partially dictated using [employer]. Any errors are unintentional.  Halina Maidens, MD [facility]  04/02/2018

## 2018-04-04 ENCOUNTER — Other Ambulatory Visit (INDEPENDENT_AMBULATORY_CARE_PROVIDER_SITE_OTHER): Payer: PPO

## 2018-04-25 ENCOUNTER — Ambulatory Visit (INDEPENDENT_AMBULATORY_CARE_PROVIDER_SITE_OTHER): Payer: PPO | Admitting: Internal Medicine

## 2018-04-25 ENCOUNTER — Encounter: Payer: Self-pay | Admitting: Internal Medicine

## 2018-04-25 VITALS — BP 120/66 | HR 75 | Ht 73.0 in | Wt 187.0 lb

## 2018-04-25 DIAGNOSIS — R222 Localized swelling, mass and lump, trunk: Secondary | ICD-10-CM

## 2018-04-25 NOTE — Progress Notes (Signed)
Date:  04/25/2018   Name:  Aaron [name]   DOB:  1956-04-22   MRN:  [id number]   Chief Complaint: Mass (Noticed lump on RLQ Monday while taking a shower. Not painful. )  HPI  Review of Systems  Constitutional: Negative for chills and fever.  Respiratory: Negative for shortness of breath.   Cardiovascular: Negative for chest pain and palpitations.  Gastrointestinal: Negative for abdominal pain, diarrhea and nausea.  Skin: Positive for color change.    Patient Active Problem List   Diagnosis Date Noted  . Fracture of left ankle, closed, initial encounter 10/23/2017  . Syncope 09/16/2017  . Pain in limb 07/13/2016  . Tobacco use disorder 07/13/2016  . Elevated blood sugar 07/27/2015  . Controlled gout 12/22/2014  . CAD in native artery 10/09/2014  . Essential (primary) hypertension 10/09/2014  . Dupuytren's disease of palm 10/09/2014  . Psoriasis 10/09/2014  . Pain in shoulder 10/09/2014  . H/O cardiac catheterization 01/27/2014  . HLD (hyperlipidemia) 01/27/2014  . Peripheral vascular disease ([place]) 01/27/2014    No Known Allergies  Past Surgical History:  Procedure Laterality Date  . APPENDECTOMY    . cardiac stents   2012  . COLONOSCOPY  2014    Social History   Tobacco Use  . Smoking status: Current Every Day Smoker    Types: Cigars  . Smokeless tobacco: Never Used  Substance Use Topics  . Alcohol use: Not Currently    Alcohol/week: 1.0 standard drinks    Types: 1 Cans of beer per week  . Drug use: No     Medication list has been reviewed and updated.  Current Meds  Medication Sig  . aspirin 325 MG EC tablet Take 1 tablet by mouth daily.  . cilostazol (PLETAL) 50 MG tablet Take 1 tablet by mouth 2 (two) times daily.   . clopidogrel (PLAVIX) 75 MG tablet Take 1 tablet by mouth daily.  . indomethacin (INDOCIN) 25 MG capsule TAKE 1 CAPSULE TWICE A DAY AS NEEDED  . isosorbide mononitrate (IMDUR) 60 MG 24 hr tablet Take 90 mg by mouth daily.   [name]  lisinopril (PRINIVIL,ZESTRIL) 10 MG tablet Take 1 tablet by mouth daily.  [name] loperamide (IMODIUM A-D) 2 MG tablet Take 1 tablet (2 mg total) by mouth 4 (four) times daily as needed for diarrhea or loose stools.  . meclizine (ANTIVERT) 25 MG tablet Take 1 tablet (25 mg total) by mouth 3 (three) times daily as needed for dizziness.  [name] METOPROLOL TARTRATE PO Take by mouth. Take 50MG  by mouth every morning and 75MG  every evening  . nitroGLYCERIN (NITROSTAT) 0.4 MG SL tablet Place 1 tablet under the tongue as needed.  . simvastatin (ZOCOR) 40 MG tablet Take 1 tablet (40 mg total) by mouth at bedtime.    PHQ 2/9 Scores 10/17/2017 10/17/2017 04/02/2017  PHQ - 2 Score 0 0 0  PHQ- 9 Score 0 - -    Physical Exam  Constitutional: He is oriented to person, place, and time. He appears well-developed. No distress.  HENT:  Head: Normocephalic and atraumatic.  Cardiovascular: Normal rate, regular rhythm and normal heart sounds.  Pulmonary/Chest: Effort normal and breath sounds normal. No respiratory distress.  Abdominal: Soft. Normal appearance and bowel sounds are normal. There is no hepatosplenomegaly. There is no tenderness. No hernia.    Musculoskeletal: Normal range of motion.  Neurological: He is alert and oriented to person, place, and time.  Skin: Skin is warm and dry. No rash noted.  Psychiatric: He has a normal mood and affect. His behavior is normal. Thought content normal.  Nursing note and vitals reviewed.   BP 120/66 (BP Location: Right Arm, Patient Position: Sitting, Cuff Size: Normal)   Pulse 75   Ht 6\' 1"  (1.854 m)   Wt 187 lb (84.8 kg)   SpO2 98%   BMI 24.67 kg/m   Assessment and Plan: 1. Mass of skin of abdomen Likely small hematoma Monitor for enlargement - otherwise should resolve over the next few weeks   Partially dictated using Dragon software. Any errors are unintentional.  Halina Maidens, MD [facility]  04/25/2018

## 2018-06-08 ENCOUNTER — Emergency Department
Admission: EM | Admit: 2018-06-08 | Discharge: 2018-06-08 | Disposition: A | Discharge status: Home / Self Care | Payer: PPO | Attending: Emergency Medicine | Admitting: Emergency Medicine

## 2018-06-08 ENCOUNTER — Emergency Department: Payer: PPO

## 2018-06-08 ENCOUNTER — Other Ambulatory Visit: Payer: Self-pay

## 2018-06-08 ENCOUNTER — Encounter: Payer: Self-pay | Admitting: Emergency Medicine

## 2018-06-08 DIAGNOSIS — R079 Chest pain, unspecified: Secondary | ICD-10-CM | POA: Diagnosis not present

## 2018-06-08 DIAGNOSIS — I2510 Atherosclerotic heart disease of native coronary artery without angina pectoris: Secondary | ICD-10-CM | POA: Insufficient documentation

## 2018-06-08 DIAGNOSIS — R51 Headache: Secondary | ICD-10-CM | POA: Diagnosis not present

## 2018-06-08 DIAGNOSIS — R0789 Other chest pain: Secondary | ICD-10-CM | POA: Diagnosis not present

## 2018-06-08 DIAGNOSIS — I10 Essential (primary) hypertension: Secondary | ICD-10-CM | POA: Diagnosis not present

## 2018-06-08 DIAGNOSIS — F17210 Nicotine dependence, cigarettes, uncomplicated: Secondary | ICD-10-CM | POA: Insufficient documentation

## 2018-06-08 DIAGNOSIS — Z79899 Other long term (current) drug therapy: Secondary | ICD-10-CM | POA: Diagnosis not present

## 2018-06-08 DIAGNOSIS — Z7982 Long term (current) use of aspirin: Secondary | ICD-10-CM | POA: Diagnosis not present

## 2018-06-08 DIAGNOSIS — R52 Pain, unspecified: Secondary | ICD-10-CM | POA: Diagnosis not present

## 2018-06-08 DIAGNOSIS — R0602 Shortness of breath: Secondary | ICD-10-CM | POA: Diagnosis not present

## 2018-06-08 LAB — BASIC METABOLIC PANEL
Anion gap: 9 (ref 5–15)
BUN: 19 mg/dL (ref 8–23)
CO2: 29 mmol/L (ref 22–32)
CREATININE: 0.94 mg/dL (ref 0.61–1.24)
Calcium: 9.8 mg/dL (ref 8.9–10.3)
Chloride: 99 mmol/L (ref 98–111)
GFR calc non Af Amer: >60 mL/min (ref >60)
Glucose, Bld: 115 mg/dL — ABNORMAL HIGH (ref 70–99)
Potassium: 4 mmol/L (ref 3.5–5.1)
SODIUM: 137 mmol/L (ref 135–145)

## 2018-06-08 LAB — CBC
HEMATOCRIT: 48.5 % (ref 39.0–52.0)
Hemoglobin: 16.5 g/dL (ref 13.0–17.0)
MCH: 33.3 pg (ref 26.0–34.0)
MCHC: 34 g/dL (ref 30.0–36.0)
MCV: 98 fL (ref 80.0–100.0)
NRBC: 0 % (ref 0.0–0.2)
Platelets: 209 10*3/uL (ref 150–400)
RBC: 4.95 MIL/uL (ref 4.22–5.81)
RDW: 13.2 % (ref 11.5–15.5)
WBC: 6.8 10*3/uL (ref 4.0–10.5)

## 2018-06-08 LAB — TROPONIN I: Troponin I: <0.03 ng/mL (ref <0.03)

## 2018-06-08 MED ORDER — MORPHINE SULFATE (PF) 4 MG/ML IV SOLN
4.0000 mg | Freq: Once | INTRAVENOUS | Status: AC
  Administered 2018-06-08: 4 mg via INTRAVENOUS
  Filled 2018-06-08: qty 1

## 2018-06-08 MED ORDER — IOPAMIDOL (ISOVUE-370) INJECTION 76%
100.0000 mL | Freq: Once | INTRAVENOUS | Status: AC | PRN
  Administered 2018-06-08: 100 mL via INTRAVENOUS

## 2018-06-08 MED ORDER — SODIUM CHLORIDE 0.9 % IV BOLUS
500.0000 mL | Freq: Once | INTRAVENOUS | Status: AC
  Administered 2018-06-08: 500 mL via INTRAVENOUS

## 2018-06-08 MED ORDER — KETOROLAC TROMETHAMINE 30 MG/ML IJ SOLN
15.0000 mg | Freq: Once | INTRAMUSCULAR | Status: AC
  Administered 2018-06-08: 15 mg via INTRAVENOUS
  Filled 2018-06-08: qty 1

## 2018-06-08 MED ORDER — METOCLOPRAMIDE HCL 5 MG/ML IJ SOLN
10.0000 mg | Freq: Once | INTRAMUSCULAR | Status: AC
  Administered 2018-06-08: 10 mg via INTRAVENOUS
  Filled 2018-06-08: qty 2

## 2018-06-08 NOTE — Discharge Instructions (Addendum)
Follow-up with your primary care doctor tomorrow as planned.  Return to the ER for new, worsening, persistent severe chest, back, or neck pain, headache, vomiting, fevers, shortness of breath, or any other new or worsening symptoms that concern you.

## 2018-06-08 NOTE — ED Notes (Signed)
First Nurse Note: Pt to ED via [facility] from home for back pain. Pt has cardiac hx/o, pt took 2 NTG prior to EMS arrival, per EMS pt reports that the nitro did help with his pain some. Pt also c/o neck pain. Per EMS pt BP was 160/70

## 2018-06-08 NOTE — ED Notes (Signed)
Patient returned from CT awaiting results and further plan of care.

## 2018-06-08 NOTE — ED Notes (Signed)
meds given as ordered. Patient off unit to CT angio

## 2018-06-08 NOTE — ED Notes (Signed)
Reports cp and pain to b/l neck since yesterday with nausea.

## 2018-06-08 NOTE — ED Provider Notes (Signed)
[facility] Emergency Department Provider Note ____________________________________________   First MD Initiated Contact with Patient 06/08/18 1441     (approximate)  I have reviewed the triage vital signs and the nursing notes.   HISTORY  Chief Complaint Chest Pain    HPI Aaron [name] is a 62 y.o. male with PMH as noted below including CAD status post stenting who presents with chest pain, acute onset last night, then somewhat improving, and recurring this afternoon when he went to walk his dog.  It is described as sharp and pressure-like, and radiates to his back, shoulders, and to his neck.  He also reports a headache which she has not had in the past.  He reports tingling in the left arm.  He states that the pain initially improved with nitroglycerin but then returned.  He denies nausea or vomiting, or shortness of breath.  Past Medical History:  Diagnosis Date  . Coronary artery disease   . Gout   . Hx of heart artery stent 01/12/2017  . Hypercholesteremia   . Hypertension   . Unstable angina ([place]) 01/12/2017  . Vertigo     Patient Active Problem List   Diagnosis Date Noted  . Fracture of left ankle, closed, initial encounter 10/23/2017  . Syncope 09/16/2017  . Pain in limb 07/13/2016  . Tobacco use disorder 07/13/2016  . Elevated blood sugar 07/27/2015  . Controlled gout 12/22/2014  . CAD in native artery 10/09/2014  . Essential (primary) hypertension 10/09/2014  . Dupuytren's disease of palm 10/09/2014  . Psoriasis 10/09/2014  . Pain in shoulder 10/09/2014  . H/O cardiac catheterization 01/27/2014  . HLD (hyperlipidemia) 01/27/2014  . Peripheral vascular disease ([place]) 01/27/2014    Past Surgical History:  Procedure Laterality Date  . APPENDECTOMY    . cardiac stents   2012  . COLONOSCOPY  2014    Prior to Admission medications   Medication Sig Start Date End Date Taking? Authorizing Provider  aspirin 325 MG EC tablet Take 1  tablet by mouth daily.    [provider]  cilostazol (PLETAL) 50 MG tablet Take 1 tablet by mouth 2 (two) times daily.     [provider]  clopidogrel (PLAVIX) 75 MG tablet Take 1 tablet by mouth daily.    [provider]  indomethacin (INDOCIN) 25 MG capsule TAKE 1 CAPSULE TWICE A DAY AS NEEDED 10/05/17   Glean Hess, MD  isosorbide mononitrate (IMDUR) 60 MG 24 hr tablet Take 90 mg by mouth daily.  09/04/16   [provider]  lisinopril (PRINIVIL,ZESTRIL) 10 MG tablet Take 1 tablet by mouth daily.    [provider]  loperamide (IMODIUM A-D) 2 MG tablet Take 1 tablet (2 mg total) by mouth 4 (four) times daily as needed for diarrhea or loose stools. 09/17/17   Henreitta Leber, MD  meclizine (ANTIVERT) 25 MG tablet Take 1 tablet (25 mg total) by mouth 3 (three) times daily as needed for dizziness. 06/28/17   Glean Hess, MD  METOPROLOL TARTRATE PO Take by mouth. Take 50MG  by mouth every morning and 75MG  every evening    [provider]  nitroGLYCERIN (NITROSTAT) 0.4 MG SL tablet Place 1 tablet under the tongue as needed.    [provider]  simvastatin (ZOCOR) 40 MG tablet Take 1 tablet (40 mg total) by mouth at bedtime. 05/21/15   Glean Hess, MD  varenicline (CHANTIX CONTINUING MONTH PAK) 1 MG tablet Take 1 tablet (1  mg total) by mouth 2 (two) times daily. Patient not taking: Reported on 04/25/2018 04/02/18   Glean Hess, MD  varenicline (CHANTIX STARTING MONTH PAK) 0.5 MG X 11 & 1 MG X 42 tablet Take one 0.5 mg tablet by mouth once daily for 3 days, then increase to one 0.5 mg tablet twice daily for 4 days, then increase to one 1 mg tablet twice daily. Patient not taking: Reported on 04/25/2018 04/02/18   Glean Hess, MD    Allergies Patient has no known allergies.  Family History  Problem Relation Age of Onset  . Hypertension Mother   . Diabetes Mother   . Heart failure Father        age 70  .  Cancer Father   . Ovarian cancer Sister   . Heart disease Brother     Social History Social History   Tobacco Use  . Smoking status: Current Every Day Smoker    Types: Cigars  . Smokeless tobacco: Never Used  Substance Use Topics  . Alcohol use: Not Currently    Alcohol/week: 1.0 standard drinks    Types: 1 Cans of beer per week  . Drug use: No    Review of Systems  Constitutional: No fever. Eyes: No redness. ENT: No sore throat. Cardiovascular: Positive for chest pain. Respiratory: Denies shortness of breath. Gastrointestinal: No vomiting or diarrhea.  Genitourinary: Negative for flank pain.  Musculoskeletal: Positive for back pain. Skin: Negative for rash. Neurological: Positive for headache.   ____________________________________________   PHYSICAL EXAM:  VITAL SIGNS: ED Triage Vitals  Enc Vitals Group     BP 06/08/18 1330 (!) 156/66     Pulse Rate 06/08/18 1330 75     Resp 06/08/18 1330 16     Temp 06/08/18 1330 98.7 F (37.1 C)     Temp Source 06/08/18 1330 Oral     SpO2 06/08/18 1330 98 %     Weight 06/08/18 1326 185 lb (83.9 kg)     Height 06/08/18 1326 6\' 1"  (1.854 m)     Head Circumference --      Peak Flow --      Pain Score 06/08/18 1445 8     Pain Loc --      Pain Edu? --      Excl. in [place]? --     Constitutional: Alert and oriented.  Relatively well appearing and in no acute distress. Eyes: Conjunctivae are normal.  Head: Atraumatic. Nose: No congestion/rhinnorhea. Mouth/Throat: Mucous membranes are moist.   Neck: Normal range of motion.  Cardiovascular: Normal rate, regular rhythm. Grossly normal heart sounds.  Good peripheral circulation. Respiratory: Normal respiratory effort.  No retractions. Lungs CTAB. Gastrointestinal: Soft and nontender. No distention.  Genitourinary: No flank tenderness. Musculoskeletal: No lower extremity edema.  Extremities warm and well perfused.  Neurologic:  Normal speech and language. No gross focal  neurologic deficits are appreciated.  Skin:  Skin is warm and dry. No rash noted. Psychiatric: Mood and affect are normal. Speech and behavior are normal.  ____________________________________________   LABS (all labs ordered are listed, but only abnormal results are displayed)  Labs Reviewed  BASIC METABOLIC PANEL - Abnormal; Notable for the following components:      Result Value   Glucose, Bld 115 (*)    All other components within normal limits  CBC  TROPONIN I  TROPONIN I   ____________________________________________  EKG  ED ECG REPORT I, Arta Silence, the attending physician, personally viewed and interpreted  this ECG.  Date: 06/08/2018 EKG Time: 1326 Rate: 74 Rhythm: normal sinus rhythm QRS Axis: normal Intervals: normal ST/T Wave abnormalities: normal Narrative Interpretation: no evidence of acute ischemia  ____________________________________________  RADIOLOGY  CXR: No focal infiltrate or other acute abnormality CT angio chest/abdomen: No acute abnormalities  ____________________________________________   PROCEDURES  Procedure(s) performed: No  Procedures  Critical Care performed: No ____________________________________________   INITIAL IMPRESSION / ASSESSMENT AND PLAN / ED COURSE  Pertinent labs & imaging results that were available during my care of the patient were reviewed by me and considered in my medical decision making (see chart for details).  62 year old male with PMH as noted above including CAD status post stenting presents with chest pain since last night, initially improved nitroglycerin but now recurring.  It radiates to his back as well as to his shoulders and neck, and he reports an associated headache which is new for him.  I reviewed the past medical records in epic; the patient was most recently seen by his cardiologist Dr. Christella Noa in August of this year for a routine maintenance visit.  He has had no recent  hospitalizations.  On exam the patient is relatively well-appearing and his vital signs are normal.  The remainder of the exam is as described above.  EKG is nonischemic.  Overall I suspect most likely musculoskeletal pain, GERD, or other benign etiology.  Given his elevated risk and the persistent nature of the pain will obtain troponins x2.  Although my clinical suspicion for aortic dissection or other vascular etiology is relatively low, given the fact that the pain is radiating to the back and shoulders is concerning.  I will obtain a CT angiogram of the chest and abdomen to rule this out.  There is no clinical evidence for PE or DVT.  ----------------------------------------- 7:06 PM on 06/08/2018 -----------------------------------------  The CT was unremarkable.  The patient's symptoms improved after analgesia.  He continues to appear comfortable.  Initial and repeat troponin was negative, and the other labs are within normal limits.  Overall I suspect most likely musculoskeletal pain, GERD, or other benign etiology.  There is no indication based on the work-up for further ED observation, work-up, or admission.  The patient has a follow-up appointment with his doctor already arranged for tomorrow morning, so I instructed him to go to this.  Return precautions given, and he expresses understanding. ____________________________________________   FINAL CLINICAL IMPRESSION(S) / ED DIAGNOSES  Final diagnoses:  Atypical chest pain      NEW MEDICATIONS STARTED DURING THIS VISIT:  New Prescriptions   No medications on file     Note:  This document was prepared using Dragon voice recognition software and may include unintentional dictation errors.    Arta Silence, MD 06/08/18 [facility]

## 2018-06-08 NOTE — ED Triage Notes (Signed)
Here for upper back and chest pain that is under shoulder blades.  Pain is also to left arm.  Hx cardiac stents X4.  Has had some SHOB and nausea.  Unlabored currently.  Color WNL..  VSS.  Pt took NTG X 2 and reports pain improved but then came back.  Also having pain in neck shooting up to jaw towards head.  Took 162 of ASA today as well.  Pt feels like pain goes straight through from back to chest.

## 2018-06-09 ENCOUNTER — Ambulatory Visit (INDEPENDENT_AMBULATORY_CARE_PROVIDER_SITE_OTHER): Payer: PPO | Admitting: Internal Medicine

## 2018-06-09 ENCOUNTER — Encounter: Payer: Self-pay | Admitting: Internal Medicine

## 2018-06-09 VITALS — BP 128/70 | HR 60 | Ht 73.0 in | Wt 190.0 lb

## 2018-06-09 DIAGNOSIS — M5092 Unspecified cervical disc disorder, mid-cervical region, unspecified level: Secondary | ICD-10-CM | POA: Diagnosis not present

## 2018-06-09 DIAGNOSIS — M62838 Other muscle spasm: Secondary | ICD-10-CM

## 2018-06-09 MED ORDER — PREDNISONE 10 MG PO TABS
10.0000 mg | ORAL_TABLET | ORAL | 0 refills | Status: AC
Start: 2018-06-09 — End: 2018-06-15

## 2018-06-09 MED ORDER — CYCLOBENZAPRINE HCL 10 MG PO TABS: 10.0000 mg | ORAL_TABLET | Freq: Every day | ORAL | 0 refills | Status: DC

## 2018-06-09 NOTE — Progress Notes (Signed)
Date:  06/09/2018   Name:  Aaron [name]   DOB:  [birth year]   MRN:  [id number]   Chief Complaint: Neck Pain (Started last wednesday. Seen ER yesterday at [facility] for chest pain and hypertenstion. Pain is shooting across neck and into head. Pain is in both sides of neck on and off. )  Neck Pain   This is a new problem. The current episode started in the past 7 days. The problem occurs daily. The problem has been unchanged. The pain is associated with nothing. The pain is present in the occipital region. The quality of the pain is described as aching. The pain is mild. The symptoms are aggravated by twisting and bending. Associated symptoms include headaches. Pertinent negatives include no chest pain, fever, numbness, trouble swallowing or weakness. He has tried nothing for the symptoms.   He was seen in ED for chest pain yesterday.  Troponins negative.  CT negative for dissection. Pain was relieved by medication and has not recurred.  Other labs were normal.  Cervical spine 09/2017: Disc levels: Degenerative disc disease with disc space narrowing and spurring at C4-5 and C6-7.  Review of Systems  Constitutional: Negative for chills, fatigue and fever.  HENT: Negative for trouble swallowing.   Respiratory: Negative for cough, chest tightness, shortness of breath and wheezing.   Cardiovascular: Negative for chest pain and palpitations.  Musculoskeletal: Positive for arthralgias, back pain and neck pain.  Neurological: Positive for headaches. Negative for dizziness, weakness and numbness.  Psychiatric/Behavioral: Negative for dysphoric mood. The patient is not nervous/anxious.     Patient Active Problem List   Diagnosis Date Noted  . Fracture of left ankle, closed, initial encounter 10/23/2017  . Syncope 09/16/2017  . Pain in limb 07/13/2016  . Tobacco use disorder 07/13/2016  . Elevated blood sugar 07/27/2015  . Controlled gout 12/22/2014  . CAD in native artery 10/09/2014  .  Essential (primary) hypertension 10/09/2014  . Dupuytren's disease of palm 10/09/2014  . Psoriasis 10/09/2014  . Pain in shoulder 10/09/2014  . H/O cardiac catheterization 01/27/2014  . HLD (hyperlipidemia) 01/27/2014  . Peripheral vascular disease ([place]) 01/27/2014    No Known Allergies  Past Surgical History:  Procedure Laterality Date  . APPENDECTOMY    . cardiac stents   2012  . COLONOSCOPY  2014    Social History   Tobacco Use  . Smoking status: Current Every Day Smoker    Types: Cigars  . Smokeless tobacco: Never Used  Substance Use Topics  . Alcohol use: Not Currently    Alcohol/week: 1.0 standard drinks    Types: 1 Cans of beer per week  . Drug use: No     Medication list has been reviewed and updated.  Current Meds  Medication Sig  . aspirin 325 MG EC tablet Take 1 tablet by mouth daily.  . clopidogrel (PLAVIX) 75 MG tablet Take 1 tablet by mouth daily.  . indomethacin (INDOCIN) 25 MG capsule TAKE 1 CAPSULE TWICE A DAY AS NEEDED  . isosorbide mononitrate (IMDUR) 60 MG 24 hr tablet Take 90 mg by mouth daily.   [name] lisinopril (PRINIVIL,ZESTRIL) 10 MG tablet Take 1 tablet by mouth daily.  [name] loperamide (IMODIUM A-D) 2 MG tablet Take 1 tablet (2 mg total) by mouth 4 (four) times daily as needed for diarrhea or loose stools.  . meclizine (ANTIVERT) 25 MG tablet Take 1 tablet (25 mg total) by mouth 3 (three) times daily as needed for dizziness.  [name]  METOPROLOL TARTRATE PO Take by mouth. Take 50MG  by mouth every morning and 75MG  every evening  . nitroGLYCERIN (NITROSTAT) 0.4 MG SL tablet Place 1 tablet under the tongue as needed.  . simvastatin (ZOCOR) 40 MG tablet Take 1 tablet (40 mg total) by mouth at bedtime.    PHQ 2/9 Scores 10/17/2017 10/17/2017 04/02/2017  PHQ - 2 Score 0 0 0  PHQ- 9 Score 0 - -    Physical Exam  Constitutional: He is oriented to person, place, and time. He appears well-developed. No distress.  HENT:  Head: Normocephalic and atraumatic.    Cardiovascular: Normal rate, regular rhythm and normal heart sounds.  Pulmonary/Chest: Effort normal and breath sounds normal. No respiratory distress. He has no decreased breath sounds. He has no wheezes.  Musculoskeletal:       Cervical back: He exhibits decreased range of motion, tenderness and spasm.  Lymphadenopathy:    He has no cervical adenopathy.  Neurological: He is alert and oriented to person, place, and time. He has normal strength.  Reflex Scores:      Bicep reflexes are 2+ on the right side and 2+ on the left side. Skin: Skin is warm and dry. No rash noted.  Psychiatric: He has a normal mood and affect. His speech is normal and behavior is normal. Thought content normal.  Nursing note and vitals reviewed.   BP 128/70 (BP Location: Right Arm, Patient Position: Sitting, Cuff Size: Normal)   Pulse 60   Ht 6\' 1"  (1.854 m)   Wt 190 lb (86.2 kg)   SpO2 97%   BMI 25.07 kg/m   Assessment and Plan: 1. Cervical disc disorder of mid-cervical region Likely causing left arm tingling that has since resolved - predniSONE (DELTASONE) 10 MG tablet; Take 1 tablet (10 mg total) by mouth as directed for 6 days. Take 6,5,4,3,2,1 then stop  Dispense: 21 tablet; Refill: 0  2. Neck muscle spasm Use heat three times a day - cyclobenzaprine (FLEXERIL) 10 MG tablet; Take 1 tablet (10 mg total) by mouth at bedtime.  Dispense: 30 tablet; Refill: 0  May need to see Ortho or Neurosurgery if sx continue.  Partially dictated using Editor, commissioning. Any errors are unintentional.  Halina Maidens, MD [facility]  06/09/2018

## 2018-08-25 ENCOUNTER — Telehealth (INDEPENDENT_AMBULATORY_CARE_PROVIDER_SITE_OTHER): Payer: Self-pay | Admitting: Vascular Surgery

## 2018-08-25 NOTE — Telephone Encounter (Signed)
The patient was last seen in 07/2016. The patient can be placed on the schedule next week for an ABI and a right lower extremity DVT study. Dr. Lucky Cowboy is the attending physician.

## 2018-08-25 NOTE — Telephone Encounter (Signed)
Please advise 

## 2018-08-29 ENCOUNTER — Ambulatory Visit (INDEPENDENT_AMBULATORY_CARE_PROVIDER_SITE_OTHER): Payer: PPO | Admitting: Vascular Surgery

## 2018-08-29 ENCOUNTER — Encounter (INDEPENDENT_AMBULATORY_CARE_PROVIDER_SITE_OTHER): Payer: PPO

## 2018-08-29 ENCOUNTER — Other Ambulatory Visit (INDEPENDENT_AMBULATORY_CARE_PROVIDER_SITE_OTHER): Payer: Self-pay | Admitting: Vascular Surgery

## 2018-08-29 DIAGNOSIS — M79604 Pain in right leg: Secondary | ICD-10-CM

## 2018-09-03 ENCOUNTER — Other Ambulatory Visit: Payer: Self-pay

## 2018-09-03 ENCOUNTER — Encounter: Payer: Self-pay | Admitting: Internal Medicine

## 2018-09-03 ENCOUNTER — Ambulatory Visit (INDEPENDENT_AMBULATORY_CARE_PROVIDER_SITE_OTHER): Payer: PPO | Admitting: Internal Medicine

## 2018-09-03 ENCOUNTER — Ambulatory Visit
Admission: RE | Admit: 2018-09-03 | Discharge: 2018-09-03 | Disposition: A | Discharge status: Home / Self Care | Payer: PPO | Source: Ambulatory Visit | Attending: Internal Medicine | Admitting: Internal Medicine

## 2018-09-03 ENCOUNTER — Ambulatory Visit
Admission: RE | Admit: 2018-09-03 | Discharge: 2018-09-03 | Disposition: A | Discharge status: Home / Self Care | Payer: PPO | Attending: Internal Medicine | Admitting: Internal Medicine

## 2018-09-03 VITALS — BP 98/62 | HR 72 | Temp 98.1°F | Resp 16 | Ht 73.0 in | Wt 190.0 lb

## 2018-09-03 DIAGNOSIS — M109 Gout, unspecified: Secondary | ICD-10-CM | POA: Diagnosis not present

## 2018-09-03 DIAGNOSIS — J181 Lobar pneumonia, unspecified organism: Secondary | ICD-10-CM

## 2018-09-03 DIAGNOSIS — J189 Pneumonia, unspecified organism: Secondary | ICD-10-CM

## 2018-09-03 MED ORDER — INDOMETHACIN 25 MG PO CAPS: 25.0000 mg | ORAL_CAPSULE | Freq: Two times a day (BID) | ORAL | 1 refills | Status: DC | PRN

## 2018-09-03 MED ORDER — LEVOFLOXACIN 500 MG PO TABS: 500.0000 mg | ORAL_TABLET | Freq: Every day | ORAL | 0 refills | Status: AC

## 2018-09-03 NOTE — Progress Notes (Signed)
Date:  09/03/2018   Name:  Aaron [name]   DOB:  22-Mar-1956   MRN:  [id number]   Chief Complaint: Shortness of Breath (only hapens while trying to sleep. Almost called 911 thought he was not going to catch breath. Quit smoking 5 days ago. ) and Gout (refil indomethacin )  Shortness of Breath  This is a new problem. The current episode started in the past 7 days. The problem occurs daily. The problem has been unchanged. Associated symptoms include chest pain (with deep breathing). Pertinent negatives include no ear pain, fever, headaches, hemoptysis, leg swelling, orthopnea, sore throat, sputum production, vomiting or wheezing. He has tried nothing for the symptoms. (Recently stopped smoking)  Gout - continues to have intermittent gout sx in right ankle.  He requests a refill on medication.   Review of Systems  Constitutional: Positive for chills. Negative for fever.  HENT: Negative for ear pain, sore throat and trouble swallowing.   Respiratory: Positive for cough and shortness of breath. Negative for hemoptysis, sputum production, choking, chest tightness and wheezing.   Cardiovascular: Positive for chest pain (with deep breathing). Negative for palpitations, orthopnea and leg swelling.  Gastrointestinal: Negative for diarrhea and vomiting.  Neurological: Negative for dizziness and headaches.    Patient Active Problem List   Diagnosis Date Noted  . Fracture of left ankle, closed, initial encounter 10/23/2017  . Syncope 09/16/2017  . Pain in limb 07/13/2016  . Tobacco use disorder 07/13/2016  . Elevated blood sugar 07/27/2015  . Controlled gout 12/22/2014  . CAD in native artery 10/09/2014  . Essential (primary) hypertension 10/09/2014  . Dupuytren's disease of palm 10/09/2014  . Psoriasis 10/09/2014  . Pain in shoulder 10/09/2014  . H/O cardiac catheterization 01/27/2014  . HLD (hyperlipidemia) 01/27/2014  . Peripheral vascular disease ([place]) 01/27/2014    No Known  Allergies  Past Surgical History:  Procedure Laterality Date  . APPENDECTOMY    . cardiac stents   2012  . COLONOSCOPY  2014    Social History   Tobacco Use  . Smoking status: Former Smoker    Packs/day: 1.00    Types: Cigars    Start date: 09/03/1976    Last attempt to quit: 08/28/2018    Years since quitting: 0.0  . Smokeless tobacco: Never Used  Substance Use Topics  . Alcohol use: Not Currently    Alcohol/week: 1.0 standard drinks    Types: 1 Cans of beer per week  . Drug use: No     Medication list has been reviewed and updated.  Current Meds  Medication Sig  . aspirin 325 MG EC tablet Take 1 tablet by mouth daily.  . cilostazol (PLETAL) 50 MG tablet Take 1 tablet by mouth 2 (two) times daily.   . clopidogrel (PLAVIX) 75 MG tablet Take 1 tablet by mouth daily.  . indomethacin (INDOCIN) 25 MG capsule TAKE 1 CAPSULE TWICE A DAY AS NEEDED  . isosorbide mononitrate (IMDUR) 60 MG 24 hr tablet Take 90 mg by mouth daily.   [name] lisinopril (PRINIVIL,ZESTRIL) 10 MG tablet Take 1 tablet by mouth daily.  [name] loperamide (IMODIUM A-D) 2 MG tablet Take 1 tablet (2 mg total) by mouth 4 (four) times daily as needed for diarrhea or loose stools.  . meclizine (ANTIVERT) 25 MG tablet Take 1 tablet (25 mg total) by mouth 3 (three) times daily as needed for dizziness.  [name] METOPROLOL TARTRATE PO Take by mouth. Take 50MG  by mouth every morning and 75MG   every evening  . nitroGLYCERIN (NITROSTAT) 0.4 MG SL tablet Place 1 tablet under the tongue as needed.  . simvastatin (ZOCOR) 40 MG tablet Take 1 tablet (40 mg total) by mouth at bedtime.  . [DISCONTINUED] cyclobenzaprine (FLEXERIL) 10 MG tablet Take 1 tablet (10 mg total) by mouth at bedtime.    PHQ 2/9 Scores 09/03/2018 10/17/2017 10/17/2017 04/02/2017  PHQ - 2 Score 0 0 0 0  PHQ- 9 Score - 0 - -    Physical Exam Vitals signs and nursing note reviewed.  Constitutional:      General: He is not in acute distress.    Appearance: He is  well-developed.  HENT:     Head: Normocephalic and atraumatic.  Cardiovascular:     Rate and Rhythm: Normal rate and regular rhythm.     Heart sounds: Normal heart sounds.  Pulmonary:     Effort: Pulmonary effort is normal. No respiratory distress.     Breath sounds: Normal air entry. No stridor. Examination of the right-lower field reveals rhonchi. Rhonchi present.  Musculoskeletal: Normal range of motion.     Right lower leg: No edema.     Left lower leg: No edema.  Skin:    General: Skin is warm and dry.     Findings: No rash.  Neurological:     Mental Status: He is alert and oriented to person, place, and time.  Psychiatric:        Behavior: Behavior normal.        Thought Content: Thought content normal.     BP 98/62   Pulse 72   Temp 98.1 F (36.7 C)   Resp 16   Ht 6\' 1"  (1.854 m)   Wt 190 lb (86.2 kg)   SpO2 96%   BMI 25.07 kg/m   Assessment and Plan: 1. Community acquired pneumonia of right lower lobe of lung ([place]) Add mucinex and sample of Symbicort 40 - one puff twice a day - DG Chest 2 View; Future - levofloxacin (LEVAQUIN) 500 MG tablet; Take 1 tablet (500 mg total) by mouth daily for 7 days.  Dispense: 7 tablet; Refill: 0  2. Controlled gout - indomethacin (INDOCIN) 25 MG capsule; Take 1 capsule (25 mg total) by mouth 2 (two) times daily as needed.  Dispense: 30 capsule; Refill: 1   Partially dictated using Editor, commissioning. Any errors are unintentional.  Halina Maidens, MD [facility]  09/03/2018

## 2018-09-03 NOTE — Patient Instructions (Addendum)
Mucinex - take twice a day  Symbicort - take one puff twice a day  Delsym is a good over the counter syrup

## 2018-09-04 DIAGNOSIS — I739 Peripheral vascular disease, unspecified: Secondary | ICD-10-CM | POA: Diagnosis not present

## 2018-09-04 DIAGNOSIS — Z9889 Other specified postprocedural states: Secondary | ICD-10-CM | POA: Diagnosis not present

## 2018-09-04 DIAGNOSIS — E785 Hyperlipidemia, unspecified: Secondary | ICD-10-CM | POA: Diagnosis not present

## 2018-09-04 DIAGNOSIS — F17200 Nicotine dependence, unspecified, uncomplicated: Secondary | ICD-10-CM | POA: Diagnosis not present

## 2018-09-04 DIAGNOSIS — I2510 Atherosclerotic heart disease of native coronary artery without angina pectoris: Secondary | ICD-10-CM | POA: Diagnosis not present

## 2018-09-04 DIAGNOSIS — I10 Essential (primary) hypertension: Secondary | ICD-10-CM | POA: Diagnosis not present

## 2018-09-08 ENCOUNTER — Other Ambulatory Visit (INDEPENDENT_AMBULATORY_CARE_PROVIDER_SITE_OTHER): Payer: Self-pay | Admitting: Vascular Surgery

## 2018-09-08 DIAGNOSIS — M79604 Pain in right leg: Secondary | ICD-10-CM

## 2018-09-12 ENCOUNTER — Ambulatory Visit (INDEPENDENT_AMBULATORY_CARE_PROVIDER_SITE_OTHER): Payer: PPO | Admitting: Nurse Practitioner

## 2018-09-12 ENCOUNTER — Other Ambulatory Visit: Payer: Self-pay

## 2018-09-12 ENCOUNTER — Ambulatory Visit (INDEPENDENT_AMBULATORY_CARE_PROVIDER_SITE_OTHER): Payer: PPO

## 2018-09-12 ENCOUNTER — Encounter (INDEPENDENT_AMBULATORY_CARE_PROVIDER_SITE_OTHER): Payer: Self-pay | Admitting: Nurse Practitioner

## 2018-09-12 VITALS — BP 114/69 | HR 67 | Resp 16 | Ht 73.0 in | Wt 188.0 lb

## 2018-09-12 DIAGNOSIS — I739 Peripheral vascular disease, unspecified: Secondary | ICD-10-CM | POA: Diagnosis not present

## 2018-09-12 DIAGNOSIS — M79604 Pain in right leg: Secondary | ICD-10-CM | POA: Diagnosis not present

## 2018-09-12 DIAGNOSIS — F17200 Nicotine dependence, unspecified, uncomplicated: Secondary | ICD-10-CM

## 2018-09-12 DIAGNOSIS — Z79899 Other long term (current) drug therapy: Secondary | ICD-10-CM | POA: Diagnosis not present

## 2018-09-12 DIAGNOSIS — I10 Essential (primary) hypertension: Secondary | ICD-10-CM

## 2018-09-12 DIAGNOSIS — F17290 Nicotine dependence, other tobacco product, uncomplicated: Secondary | ICD-10-CM | POA: Diagnosis not present

## 2018-09-12 DIAGNOSIS — Z7902 Long term (current) use of antithrombotics/antiplatelets: Secondary | ICD-10-CM | POA: Diagnosis not present

## 2018-09-12 DIAGNOSIS — E782 Mixed hyperlipidemia: Secondary | ICD-10-CM | POA: Diagnosis not present

## 2018-09-14 ENCOUNTER — Encounter (INDEPENDENT_AMBULATORY_CARE_PROVIDER_SITE_OTHER): Payer: Self-pay | Admitting: Nurse Practitioner

## 2018-09-14 NOTE — Progress Notes (Signed)
SUBJECTIVE:  Patient ID: Aaron [name], male    DOB: Nov 28, 1955, 63 y.o.   MRN: [id number] Chief Complaint  Patient presents with  . Follow-up    ultrasound    HPI  Aaron [name] is a 63 y.o. male  The patient returns to the office for followup and review of the noninvasive studies. There has been a significant deterioration in the lower extremity symptoms.  The patient notes interval shortening of their claudication distance and development of mild rest pain symptoms. No new ulcers or wounds have occurred since the last visit.  There have been no significant changes to the patient's overall health care.  The patient denies amaurosis fugax or recent TIA symptoms. There are no recent neurological changes noted. The patient denies history of DVT, PE or superficial thrombophlebitis. The patient denies recent episodes of angina or shortness of breath.   ABI's Rt=0.65 and Lt=0.88 ( No previous ABI's ) The right lower extremity has monophasic waveforms in the anterior tibial artery with biphasic waveforms within the posterior tibial artery waveform.  The left lower extremity has biphasic waveforms within the tibial arteries.  There is also monophasic flow seen in the right SFA proximal extending to the level of the ankle.  A DVT study was also done and his right lower extremity was negative for any DVT.  He was negative for any superficial venous thrombosis.  Past Medical History:  Diagnosis Date  . Coronary artery disease   . Gout   . Hx of heart artery stent 01/12/2017  . Hypercholesteremia   . Hypertension   . Unstable angina ([place]) 01/12/2017  . Vertigo     Past Surgical History:  Procedure Laterality Date  . APPENDECTOMY    . cardiac stents   2012  . COLONOSCOPY  2014    Social History   Socioeconomic History  . Marital status: Single    Spouse name: Not on file  . Number of children: 0  . Years of education: Not on file  . Highest education level: 12th grade    Occupational History  . Occupation: Retired/Disabled  Social Needs  . Financial resource strain: Not hard at all  . Food insecurity:    Worry: Never true    Inability: Never true  . Transportation needs:    Medical: No    Non-medical: No  Tobacco Use  . Smoking status: Former Smoker    Packs/day: 1.00    Types: Cigars    Start date: 09/03/1976    Last attempt to quit: 08/28/2018    Years since quitting: 0.0  . Smokeless tobacco: Never Used  Substance and Sexual Activity  . Alcohol use: Not Currently    Alcohol/week: 1.0 standard drinks    Types: 1 Cans of beer per week  . Drug use: No  . Sexual activity: Not Currently    Birth control/protection: None  Lifestyle  . Physical activity:    Days per week: 7 days    Minutes per session: 40 min  . Stress: Not at all  Relationships  . Social connections:    Talks on phone: Patient refused    Gets together: Patient refused    Attends religious service: Patient refused    Active member of club or organization: Patient refused    Attends meetings of clubs or organizations: Patient refused    Relationship status: Patient refused  . Intimate partner violence:    Fear of current or ex partner: No    Emotionally  abused: No    Physically abused: No    Forced sexual activity: No  Other Topics Concern  . Not on file  Social History Narrative  . Not on file    Family History  Problem Relation Age of Onset  . Hypertension Mother   . Diabetes Mother   . Heart failure Father        age 67  . Cancer Father   . Ovarian cancer Sister   . Heart disease Brother     No Known Allergies   Review of Systems   Review of Systems: Negative Unless Checked Constitutional: [] Weight loss  [] Fever  [] Chills Cardiac: [] Chest pain   []  Atrial Fibrillation  [] Palpitations   [] Shortness of breath when laying flat   [] Shortness of breath with exertion. [] Shortness of breath at rest Vascular:  [x] Pain in legs with walking   [] Pain in legs  with standing [] Pain in legs when laying flat   [x] Claudication    [] Pain in feet when laying flat    [] History of DVT   [] Phlebitis   [x] Swelling in legs   [] Varicose veins   [] Non-healing ulcers Pulmonary:   [] Uses home oxygen   [] Productive cough   [] Hemoptysis   [] Wheeze  [] COPD   [] Asthma Neurologic:  [] Dizziness   [] Seizures  [x] Blackouts [] History of stroke   [] History of TIA  [] Aphasia   [] Temporary Blindness   [] Weakness or numbness in arm   [x] Weakness or numbness in leg Musculoskeletal:   [] Joint swelling   [] Joint pain   [] Low back pain  []  History of Knee Replacement [] Arthritis [] back Surgeries  []  Spinal Stenosis    Hematologic:  [] Easy bruising  [] Easy bleeding   [] Hypercoagulable state   [] Anemic Gastrointestinal:  [] Diarrhea   [] Vomiting  [] Gastroesophageal reflux/heartburn   [] Difficulty swallowing. [] Abdominal pain Genitourinary:  [] Chronic kidney disease   [] Difficult urination  [] Anuric   [] Blood in urine [] Frequent urination  [] Burning with urination   [] Hematuria Skin:  [] Rashes   [] Ulcers [] Wounds Psychological:  [] History of anxiety   []  History of major depression  []  Memory Difficulties      OBJECTIVE:   Physical Exam  BP 114/69   Pulse 67   Resp 16   Ht 6\' 1"  (1.854 m)   Wt 188 lb (85.3 kg)   BMI 24.80 kg/m   Gen: WD/WN, NAD Head: /AT, No temporalis wasting.  Ear/Nose/Throat: Hearing grossly intact, nares w/o erythema or drainage Eyes: PER, EOMI, sclera nonicteric.  Neck: Supple, no masses.  No JVD.  Pulmonary:  Good air movement, no use of accessory muscles.  Cardiac: RRR Vascular:  Vessel Right Left  Radial Palpable Palpable  Dorsalis Pedis Not Palpable Palpable  Posterior Tibial Not Palpable Trace Palpable   Gastrointestinal: soft, non-distended. No guarding/no peritoneal signs.  Musculoskeletal: M/S 5/5 throughout.  No deformity or atrophy.  Neurologic: Pain and light touch intact in extremities.  Symmetrical.  Speech is fluent. Motor exam as  listed above. Psychiatric: Judgment intact, Mood & affect appropriate for pt's clinical situation. Dermatologic: No Venous rashes. No Ulcers Noted.  No changes consistent with cellulitis. Lymph : No Cervical lymphadenopathy, no lichenification or skin changes of chronic lymphedema.       ASSESSMENT AND PLAN:  1. Peripheral vascular disease ([place]) Recommend:  The patient has experienced increased symptoms and is now describing lifestyle limiting claudication and mild rest pain.   Given the severity of the patient's right lower extremity symptoms the patient should undergo angiography and intervention.  Risk and benefits were reviewed the patient.  Indications for the procedure were reviewed.  All questions were answered, the patient agrees to proceed.   The patient should continue walking and begin a more formal exercise program.  The patient should continue antiplatelet therapy and aggressive treatment of the lipid abnormalities  The patient will follow up with me after the angiogram.   2. Essential (primary) hypertension Continue antihypertensive medications as already ordered, these medications have been reviewed and there are no changes at this time.   3. Mixed hyperlipidemia Continue statin as ordered and reviewed, no changes at this time   4. Tobacco use disorder Smoking cessation was discussed, 3-10 minutes spent on this topic specifically    Current Outpatient Medications on File Prior to Visit  Medication Sig Dispense Refill  . aspirin 325 MG EC tablet Take 1 tablet by mouth daily.    . cilostazol (PLETAL) 50 MG tablet Take 1 tablet by mouth 2 (two) times daily.     . clopidogrel (PLAVIX) 75 MG tablet Take 1 tablet by mouth daily.    . indomethacin (INDOCIN) 25 MG capsule Take 1 capsule (25 mg total) by mouth 2 (two) times daily as needed. 30 capsule 1  . isosorbide mononitrate (IMDUR) 60 MG 24 hr tablet Take 90 mg by mouth daily.     [name] lisinopril (PRINIVIL,ZESTRIL)  10 MG tablet Take 1 tablet by mouth daily.    . meclizine (ANTIVERT) 25 MG tablet Take 1 tablet (25 mg total) by mouth 3 (three) times daily as needed for dizziness. 30 tablet 0  . METOPROLOL TARTRATE PO Take by mouth. Take 50MG  by mouth every morning and 75MG  every evening    . nitroGLYCERIN (NITROSTAT) 0.4 MG SL tablet Place 1 tablet under the tongue as needed.    . simvastatin (ZOCOR) 40 MG tablet Take 1 tablet (40 mg total) by mouth at bedtime. 30 tablet 5  . varenicline (CHANTIX CONTINUING MONTH PAK) 1 MG tablet Take 1 tablet (1 mg total) by mouth 2 (two) times daily. 60 tablet 4  . loperamide (IMODIUM A-D) 2 MG tablet Take 1 tablet (2 mg total) by mouth 4 (four) times daily as needed for diarrhea or loose stools. (Patient not taking: Reported on 09/12/2018) 30 tablet 0  . varenicline (CHANTIX STARTING MONTH PAK) 0.5 MG X 11 & 1 MG X 42 tablet Take one 0.5 mg tablet by mouth once daily for 3 days, then increase to one 0.5 mg tablet twice daily for 4 days, then increase to one 1 mg tablet twice daily. (Patient not taking: Reported on 06/09/2018) 53 tablet 0   No current facility-administered medications on file prior to visit.     There are no Patient Instructions on file for this visit. No follow-ups on file.   Kris Hartmann, NP  This note was completed with Sales executive.  Any errors are purely unintentional.

## 2018-09-15 ENCOUNTER — Encounter (INDEPENDENT_AMBULATORY_CARE_PROVIDER_SITE_OTHER): Payer: Self-pay

## 2018-09-15 ENCOUNTER — Telehealth (INDEPENDENT_AMBULATORY_CARE_PROVIDER_SITE_OTHER): Payer: Self-pay

## 2018-09-15 ENCOUNTER — Emergency Department
Admission: EM | Admit: 2018-09-15 | Discharge: 2018-09-15 | Disposition: A | Discharge status: Home / Self Care | Payer: PPO | Attending: Emergency Medicine | Admitting: Emergency Medicine

## 2018-09-15 ENCOUNTER — Other Ambulatory Visit: Payer: Self-pay

## 2018-09-15 ENCOUNTER — Encounter (INDEPENDENT_AMBULATORY_CARE_PROVIDER_SITE_OTHER): Payer: Self-pay | Admitting: Vascular Surgery

## 2018-09-15 ENCOUNTER — Emergency Department: Payer: PPO

## 2018-09-15 DIAGNOSIS — Z955 Presence of coronary angioplasty implant and graft: Secondary | ICD-10-CM | POA: Insufficient documentation

## 2018-09-15 DIAGNOSIS — R202 Paresthesia of skin: Secondary | ICD-10-CM | POA: Insufficient documentation

## 2018-09-15 DIAGNOSIS — Z87891 Personal history of nicotine dependence: Secondary | ICD-10-CM | POA: Diagnosis not present

## 2018-09-15 DIAGNOSIS — I2510 Atherosclerotic heart disease of native coronary artery without angina pectoris: Secondary | ICD-10-CM | POA: Insufficient documentation

## 2018-09-15 DIAGNOSIS — I10 Essential (primary) hypertension: Secondary | ICD-10-CM | POA: Insufficient documentation

## 2018-09-15 DIAGNOSIS — R51 Headache: Secondary | ICD-10-CM | POA: Diagnosis not present

## 2018-09-15 DIAGNOSIS — R079 Chest pain, unspecified: Secondary | ICD-10-CM

## 2018-09-15 LAB — BASIC METABOLIC PANEL
Anion gap: 10 (ref 5–15)
BUN: 17 mg/dL (ref 8–23)
CHLORIDE: 103 mmol/L (ref 98–111)
CO2: 22 mmol/L (ref 22–32)
Calcium: 9 mg/dL (ref 8.9–10.3)
Creatinine, Ser: 0.82 mg/dL (ref 0.61–1.24)
GFR calc Af Amer: >60 mL/min (ref >60)
GFR calc non Af Amer: >60 mL/min (ref >60)
Glucose, Bld: 142 mg/dL — ABNORMAL HIGH (ref 70–99)
Potassium: 3.8 mmol/L (ref 3.5–5.1)
Sodium: 135 mmol/L (ref 135–145)

## 2018-09-15 LAB — CBC
HEMATOCRIT: 51.5 % (ref 39.0–52.0)
Hemoglobin: 17.2 g/dL — ABNORMAL HIGH (ref 13.0–17.0)
MCH: 32.6 pg (ref 26.0–34.0)
MCHC: 33.4 g/dL (ref 30.0–36.0)
MCV: 97.7 fL (ref 80.0–100.0)
Platelets: 231 10*3/uL (ref 150–400)
RBC: 5.27 MIL/uL (ref 4.22–5.81)
RDW: 13 % (ref 11.5–15.5)
WBC: 9.6 10*3/uL (ref 4.0–10.5)
nRBC: 0 % (ref 0.0–0.2)

## 2018-09-15 LAB — TROPONIN I
Troponin I: <0.03 ng/mL (ref <0.03)
Troponin I: <0.03 ng/mL (ref <0.03)

## 2018-09-15 MED ORDER — MORPHINE SULFATE (PF) 4 MG/ML IV SOLN
4.0000 mg | Freq: Once | INTRAVENOUS | Status: AC
  Administered 2018-09-15: 4 mg via INTRAVENOUS
  Filled 2018-09-15: qty 1

## 2018-09-15 MED ORDER — DIAZEPAM 5 MG PO TABS: 5.0000 mg | ORAL_TABLET | Freq: Three times a day (TID) | ORAL | 0 refills | Status: DC | PRN

## 2018-09-15 NOTE — ED Triage Notes (Signed)
Pt arrives via ems from home. Ems reports chest pain starting yesterday afternoon. Pt took 3 SL nitro tabs yesterday. Pt took two 81mg  Asprin tablets this morning. Pt c/o intermittentd sharp substernal chest pain, with intermittenet numbness and tingling in arms and face pain over left eye and back of the head. A&o x 4 on arrival able to move self from ems stretcher. NAD noted at this time.

## 2018-09-15 NOTE — ED Provider Notes (Signed)
[facility] Emergency Department Provider Note       Time seen: ----------------------------------------- 9:14 AM on 09/15/2018 -----------------------------------------   I have reviewed the triage vital signs and the nursing notes.  HISTORY   Chief Complaint Chest Pain   HPI Aaron [name] is a 63 y.o. male with a history of coronary artery disease, gout, hyperlipidemia, hypertension, angina, vertigo who presents to the ED for chest pain that started yesterday afternoon.  Patient took 3 sublingual nitroglycerin tabs yesterday and took 2 aspirin tablets this morning.  He complains of intermittent sharp chest pain with numbness and tingling in his arms.  Past Medical History:  Diagnosis Date  . Coronary artery disease   . Gout   . Hx of heart artery stent 01/12/2017  . Hypercholesteremia   . Hypertension   . Unstable angina ([place]) 01/12/2017  . Vertigo     Patient Active Problem List   Diagnosis Date Noted  . Fracture of left ankle, closed, initial encounter 10/23/2017  . Syncope 09/16/2017  . Pain in limb 07/13/2016  . Tobacco use disorder 07/13/2016  . Elevated blood sugar 07/27/2015  . Controlled gout 12/22/2014  . CAD in native artery 10/09/2014  . Essential (primary) hypertension 10/09/2014  . Dupuytren's disease of palm 10/09/2014  . Psoriasis 10/09/2014  . Pain in shoulder 10/09/2014  . H/O cardiac catheterization 01/27/2014  . HLD (hyperlipidemia) 01/27/2014  . Peripheral vascular disease ([place]) 01/27/2014    Past Surgical History:  Procedure Laterality Date  . APPENDECTOMY    . cardiac stents   2012  . COLONOSCOPY  2014    Allergies Patient has no known allergies.  Social History Social History   Tobacco Use  . Smoking status: Former Smoker    Packs/day: 1.00    Types: Cigars    Start date: 09/03/1976    Last attempt to quit: 08/28/2018    Years since quitting: 0.0  . Smokeless tobacco: Never Used  Substance Use Topics   . Alcohol use: Not Currently    Alcohol/week: 1.0 standard drinks    Types: 1 Cans of beer per week  . Drug use: No   Review of Systems Constitutional: Negative for fever. Cardiovascular: Positive for chest pain Respiratory: Negative for shortness of breath. Gastrointestinal: Negative for abdominal pain, vomiting and diarrhea. Musculoskeletal: Negative for back pain. Skin: Negative for rash. Neurological: Positive for paresthesias  All systems negative/normal/unremarkable except as stated in the HPI  ____________________________________________   PHYSICAL EXAM:  VITAL SIGNS: ED Triage Vitals  Enc Vitals Group     BP 09/15/18 0908 (!) 141/72     Pulse Rate 09/15/18 0908 63     Resp 09/15/18 0908 13     Temp 09/15/18 0908 98.2 F (36.8 C)     Temp Source 09/15/18 0908 Oral     SpO2 09/15/18 0904 98 %     Weight --      Height --      Head Circumference --      Peak Flow --      Pain Score --      Pain Loc --      Pain Edu? --      Excl. in [place]? --    Constitutional: Alert and oriented. Well appearing and in no distress. Eyes: Conjunctivae are normal. Normal extraocular movements. Cardiovascular: Normal rate, regular rhythm. No murmurs, rubs, or gallops. Respiratory: Normal respiratory effort without tachypnea nor retractions. Breath sounds are clear and equal bilaterally. No wheezes/rales/rhonchi.  Gastrointestinal: Soft and nontender. Normal bowel sounds Musculoskeletal: Nontender with normal range of motion in extremities. No lower extremity tenderness nor edema. Neurologic:  Normal speech and language. No gross focal neurologic deficits are appreciated.  Skin:  Skin is warm, dry and intact. No rash noted. Psychiatric: Mood and affect are normal. Speech and behavior are normal.  ____________________________________________  EKG: Interpreted by me.  Sinus rhythm the rate of 63 bpm, normal PR interval, normal QRS, normal QT, nonspecific ST segment changes not  significantly changed from prior  Repeat EKG with sinus rhythm, 59 bpm, inferior likely early repolarization changes, normal axis, normal QT ____________________________________________  ED COURSE:  As part of my medical decision making, I reviewed the following data within the [place] History obtained from family if available, nursing notes, old chart and ekg, as well as notes from prior ED visits. Patient presented for chest pain, we will assess with labs and imaging as indicated at this time.   Procedures ____________________________________________   LABS (pertinent positives/negatives)  Labs Reviewed  CBC - Abnormal; Notable for the following components:      Result Value   Hemoglobin 17.2 (*)    All other components within normal limits  BASIC METABOLIC PANEL  TROPONIN I    RADIOLOGY Images were viewed by me  Chest x-ray  IMPRESSION: Persistent subtle airspace consolidation in the right lower lobe. ____________________________________________   DIFFERENTIAL DIAGNOSIS   Unstable angina, MI, musculoskeletal pain, GERD, anxiety  FINAL ASSESSMENT AND PLAN  Chest pain   Plan: The patient had presented for chest pain. Patient's labs not reveal any acute process including repeat troponin at 3 hours. Patient's imaging revealed a persistent airspace consolidation without significant change.  He denies any respiratory symptoms.  He was discussed with his cardiologist prior to discharge.  He is cleared for outpatient follow-up with same.   Laurence Aly, MD    Note: This note was generated in part or whole with voice recognition software. Voice recognition is usually quite accurate but there are transcription errors that can and very often do occur. I apologize for any typographical errors that were not detected and corrected.     Earleen Newport, MD 09/15/18 1320

## 2018-09-15 NOTE — ED Notes (Signed)
Repeat EKG completed at this time.

## 2018-09-15 NOTE — Telephone Encounter (Signed)
I attempted to contact the patient to go over his procedure date/time and pre-op. I was unable to leave a message due to the patient's mailbox not being set up. Patient is is scheduled with Dr. Lucky Cowboy for 09/25/2018 with an arrival time of 6:45 am. Patient's pre-op is on 09/24/2018 with an arrival time 10:30 am. This information will be mailed out to the patient.

## 2018-09-23 ENCOUNTER — Other Ambulatory Visit (INDEPENDENT_AMBULATORY_CARE_PROVIDER_SITE_OTHER): Payer: Self-pay | Admitting: Nurse Practitioner

## 2018-09-24 ENCOUNTER — Other Ambulatory Visit: Payer: Self-pay

## 2018-09-24 ENCOUNTER — Encounter
Admission: RE | Admit: 2018-09-24 | Discharge: 2018-09-24 | Disposition: A | Discharge status: Home / Self Care | Payer: PPO | Source: Ambulatory Visit | Attending: Vascular Surgery | Admitting: Vascular Surgery

## 2018-09-24 DIAGNOSIS — I2510 Atherosclerotic heart disease of native coronary artery without angina pectoris: Secondary | ICD-10-CM | POA: Diagnosis not present

## 2018-09-24 DIAGNOSIS — Z79899 Other long term (current) drug therapy: Secondary | ICD-10-CM | POA: Diagnosis not present

## 2018-09-24 DIAGNOSIS — Z01812 Encounter for preprocedural laboratory examination: Secondary | ICD-10-CM

## 2018-09-24 DIAGNOSIS — Z87891 Personal history of nicotine dependence: Secondary | ICD-10-CM | POA: Diagnosis not present

## 2018-09-24 DIAGNOSIS — Z8249 Family history of ischemic heart disease and other diseases of the circulatory system: Secondary | ICD-10-CM | POA: Diagnosis not present

## 2018-09-24 DIAGNOSIS — Z7982 Long term (current) use of aspirin: Secondary | ICD-10-CM | POA: Diagnosis not present

## 2018-09-24 DIAGNOSIS — M109 Gout, unspecified: Secondary | ICD-10-CM | POA: Diagnosis not present

## 2018-09-24 DIAGNOSIS — Z7902 Long term (current) use of antithrombotics/antiplatelets: Secondary | ICD-10-CM | POA: Diagnosis not present

## 2018-09-24 DIAGNOSIS — I70223 Atherosclerosis of native arteries of extremities with rest pain, bilateral legs: Secondary | ICD-10-CM | POA: Diagnosis not present

## 2018-09-24 DIAGNOSIS — E782 Mixed hyperlipidemia: Secondary | ICD-10-CM | POA: Diagnosis not present

## 2018-09-24 DIAGNOSIS — Z955 Presence of coronary angioplasty implant and graft: Secondary | ICD-10-CM | POA: Diagnosis not present

## 2018-09-24 DIAGNOSIS — I10 Essential (primary) hypertension: Secondary | ICD-10-CM | POA: Diagnosis not present

## 2018-09-24 LAB — CREATININE, SERUM
Creatinine, Ser: 0.88 mg/dL (ref 0.61–1.24)
GFR calc Af Amer: >60 mL/min (ref >60)
GFR calc non Af Amer: >60 mL/min (ref >60)

## 2018-09-24 LAB — BUN: BUN: 19 mg/dL (ref 8–23)

## 2018-09-24 MED ORDER — CEFAZOLIN SODIUM-DEXTROSE 2-4 GM/100ML-% IV SOLN
2.0000 g | Freq: Once | INTRAVENOUS | Status: AC
  Administered 2018-09-25: 2 g via INTRAVENOUS

## 2018-09-24 NOTE — Patient Outreach (Signed)
[facility]) Care Management  09/24/2018  Aaron [name] May 17, 1956 [id number]   Telephone Screen  Referral Date:09/24/2018 Referral Source: [employer] Concierge Referral Reason: " member requesting information and assistance with transportation to MD appts, assistance with help for meds" Insurance: [employer]   Outreach attempt # 1 to patient. No answer and unable to leave voicemail message as voicemail not set up.     Plan: RN CM will make outreach attempt to patient within 3-4 business days. RN CM will send unsuccessful outreach letter to patient.   [name], RN,BSN,CCM [place] [facility] Telephonic Care Management Coordinator Direct Phone: [phone] Toll Free: [phone] Fax: [phone]

## 2018-09-25 ENCOUNTER — Encounter
Admission: AC | Disposition: A | Discharge status: Home / Self Care | Payer: Self-pay | Source: Home / Self Care | Attending: Vascular Surgery

## 2018-09-25 ENCOUNTER — Other Ambulatory Visit: Payer: Self-pay

## 2018-09-25 ENCOUNTER — Ambulatory Visit
Admission: AC | Admit: 2018-09-25 | Discharge: 2018-09-25 | Disposition: A | Discharge status: Home / Self Care | Payer: PPO | Attending: Vascular Surgery | Admitting: Vascular Surgery

## 2018-09-25 DIAGNOSIS — I70213 Atherosclerosis of native arteries of extremities with intermittent claudication, bilateral legs: Secondary | ICD-10-CM | POA: Diagnosis not present

## 2018-09-25 DIAGNOSIS — E782 Mixed hyperlipidemia: Secondary | ICD-10-CM | POA: Insufficient documentation

## 2018-09-25 DIAGNOSIS — I739 Peripheral vascular disease, unspecified: Secondary | ICD-10-CM

## 2018-09-25 DIAGNOSIS — I70223 Atherosclerosis of native arteries of extremities with rest pain, bilateral legs: Secondary | ICD-10-CM | POA: Diagnosis not present

## 2018-09-25 DIAGNOSIS — I10 Essential (primary) hypertension: Secondary | ICD-10-CM | POA: Insufficient documentation

## 2018-09-25 DIAGNOSIS — Z87891 Personal history of nicotine dependence: Secondary | ICD-10-CM | POA: Insufficient documentation

## 2018-09-25 DIAGNOSIS — I2510 Atherosclerotic heart disease of native coronary artery without angina pectoris: Secondary | ICD-10-CM | POA: Insufficient documentation

## 2018-09-25 DIAGNOSIS — Z7982 Long term (current) use of aspirin: Secondary | ICD-10-CM | POA: Insufficient documentation

## 2018-09-25 DIAGNOSIS — Z7902 Long term (current) use of antithrombotics/antiplatelets: Secondary | ICD-10-CM | POA: Insufficient documentation

## 2018-09-25 DIAGNOSIS — Z8249 Family history of ischemic heart disease and other diseases of the circulatory system: Secondary | ICD-10-CM | POA: Insufficient documentation

## 2018-09-25 DIAGNOSIS — Z955 Presence of coronary angioplasty implant and graft: Secondary | ICD-10-CM | POA: Insufficient documentation

## 2018-09-25 DIAGNOSIS — M109 Gout, unspecified: Secondary | ICD-10-CM | POA: Insufficient documentation

## 2018-09-25 DIAGNOSIS — Z79899 Other long term (current) drug therapy: Secondary | ICD-10-CM | POA: Insufficient documentation

## 2018-09-25 HISTORY — PX: LOWER EXTREMITY ANGIOGRAPHY: CATH118251

## 2018-09-25 SURGERY — LOWER EXTREMITY ANGIOGRAPHY
Anesthesia: Moderate Sedation | Site: Leg Lower | Laterality: Right

## 2018-09-25 MED ORDER — IOHEXOL 300 MG/ML  SOLN
INTRAMUSCULAR | Status: DC | PRN
  Administered 2018-09-25: 130 mL via INTRA_ARTERIAL

## 2018-09-25 MED ORDER — MIDAZOLAM HCL 2 MG/ML PO SYRP: 8.0000 mg | ORAL_SOLUTION | Freq: Once | ORAL | Status: DC | PRN

## 2018-09-25 MED ORDER — HEPARIN SODIUM (PORCINE) 1000 UNIT/ML IJ SOLN
INTRAMUSCULAR | Status: AC
  Filled 2018-09-25: qty 1

## 2018-09-25 MED ORDER — MIDAZOLAM HCL 2 MG/2ML IJ SOLN
INTRAMUSCULAR | Status: DC | PRN
  Administered 2018-09-25: 1 mg via INTRAVENOUS

## 2018-09-25 MED ORDER — SODIUM CHLORIDE 0.9 % IV SOLN
INTRAVENOUS | Status: DC
  Administered 2018-09-25: 08:00:00 via INTRAVENOUS

## 2018-09-25 MED ORDER — FENTANYL CITRATE (PF) 100 MCG/2ML IJ SOLN
INTRAMUSCULAR | Status: DC | PRN
  Administered 2018-09-25: 50 ug via INTRAVENOUS

## 2018-09-25 MED ORDER — METHYLPREDNISOLONE SODIUM SUCC 125 MG IJ SOLR: 125.0000 mg | Freq: Once | INTRAMUSCULAR | Status: DC | PRN

## 2018-09-25 MED ORDER — FAMOTIDINE 20 MG PO TABS: 40.0000 mg | ORAL_TABLET | Freq: Once | ORAL | Status: DC | PRN

## 2018-09-25 MED ORDER — ONDANSETRON HCL 4 MG/2ML IJ SOLN: 4.0000 mg | Freq: Four times a day (QID) | INTRAMUSCULAR | Status: DC | PRN

## 2018-09-25 MED ORDER — DIPHENHYDRAMINE HCL 50 MG/ML IJ SOLN: 50.0000 mg | Freq: Once | INTRAMUSCULAR | Status: DC | PRN

## 2018-09-25 MED ORDER — HEPARIN SODIUM (PORCINE) 1000 UNIT/ML IJ SOLN
INTRAMUSCULAR | Status: DC | PRN
  Administered 2018-09-25: 50000 [IU] via INTRAVENOUS

## 2018-09-25 MED ORDER — FENTANYL CITRATE (PF) 100 MCG/2ML IJ SOLN
INTRAMUSCULAR | Status: AC
  Filled 2018-09-25: qty 2

## 2018-09-25 MED ORDER — MIDAZOLAM HCL 5 MG/5ML IJ SOLN
INTRAMUSCULAR | Status: AC
  Filled 2018-09-25: qty 5

## 2018-09-25 MED ORDER — LIDOCAINE-EPINEPHRINE (PF) 1 %-1:200000 IJ SOLN
INTRAMUSCULAR | Status: AC
  Filled 2018-09-25: qty 30

## 2018-09-25 MED ORDER — HYDROMORPHONE HCL 1 MG/ML IJ SOLN: 1.0000 mg | Freq: Once | INTRAMUSCULAR | Status: DC | PRN

## 2018-09-25 SURGICAL SUPPLY — 30 items
BALLN DORADO 6X80X80 (BALLOONS) ×3
BALLN DORADO 7X20X80 (BALLOONS) ×3
BALLN LUTONIX 018 4X100X130 (BALLOONS) ×3
BALLN LUTONIX 018 5X300X130 (BALLOONS) ×3
BALLN LUTONIX DCB 7X40X130 (BALLOONS) ×3
BALLN LUTONIX DCB 7X60X130 (BALLOONS) ×3
BALLN ULTRVRSE 8X40X75C (BALLOONS) ×3
BALLOON DORADO 6X80X80 (BALLOONS) ×1 IMPLANT
BALLOON DORADO 7X20X80 (BALLOONS) ×1 IMPLANT
BALLOON LUTONIX 018 4X100X130 (BALLOONS) ×1 IMPLANT
BALLOON LUTONIX 018 5X300X130 (BALLOONS) ×1 IMPLANT
BALLOON LUTONIX DCB 7X40X130 (BALLOONS) ×1 IMPLANT
BALLOON LUTONIX DCB 7X60X130 (BALLOONS) ×1 IMPLANT
BALLOON ULTRVRSE 8X40X75C (BALLOONS) ×1 IMPLANT
CATH BEACON 5 .038 100 VERT TP (CATHETERS) ×3 IMPLANT
CATH PIG 70CM (CATHETERS) ×3 IMPLANT
DEVICE PRESTO INFLATION (MISCELLANEOUS) ×3 IMPLANT
DEVICE STARCLOSE SE CLOSURE (Vascular Products) ×3 IMPLANT
GLIDEWIRE ADV .035X260CM (WIRE) ×3 IMPLANT
INTRODUCER 7FR 23CM (INTRODUCER) ×3 IMPLANT
PACK ANGIOGRAPHY (CUSTOM PROCEDURE TRAY) ×3 IMPLANT
SHEATH ANL2 6FRX45 HC (SHEATH) ×3 IMPLANT
SHEATH BRITE TIP 5FRX11 (SHEATH) ×3 IMPLANT
STENT LIFESTAR 8X40 (Permanent Stent) ×3 IMPLANT
STENT LIFESTENT 5F 6X80X135 (Permanent Stent) ×3 IMPLANT
STENT LIFESTREAM 9X38X80 (Permanent Stent) ×3 IMPLANT
SYR MEDRAD MARK 7 150ML (SYRINGE) ×3 IMPLANT
TUBING CONTRAST HIGH PRESS 72 (TUBING) ×3 IMPLANT
WIRE G V18X300CM (WIRE) ×3 IMPLANT
WIRE J 3MM .035X145CM (WIRE) ×3 IMPLANT

## 2018-09-25 NOTE — Discharge Instructions (Signed)
Angiogram, Care After °This sheet gives you information about how to care for yourself after your procedure. Your doctor may also give you more specific instructions. If you have problems or questions, contact your doctor. °Follow these instructions at home: °Insertion site care °· Follow instructions from your doctor about how to take care of your long, thin tube (catheter) insertion area. Make sure you: °? Wash your hands with soap and water before you change your bandage (dressing). If you cannot use soap and water, use hand sanitizer. °? Change your bandage as told by your doctor. °? Leave stitches (sutures), skin glue, or skin tape (adhesive) strips in place. They may need to stay in place for 2 weeks or longer. If tape strips get loose and curl up, you may trim the loose edges. Do not remove tape strips completely unless your doctor says it is okay. °· Do not take baths, swim, or use a hot tub until your doctor says it is okay. °· You may shower 24-48 hours after the procedure or as told by your doctor. °? Gently wash the area with plain soap and water. °? Pat the area dry with a clean towel. °? Do not rub the area. This may cause bleeding. °· Do not apply powder or lotion to the area. Keep the area clean and dry. °· Check your insertion area every day for signs of infection. Check for: °? More redness, swelling, or pain. °? Fluid or blood. °? Warmth. °? Pus or a bad smell. °Activity °· Rest as told by your doctor, usually for 1-2 days. °· Do not lift anything that is heavier than 10 lbs. (4.5 kg) or as told by your doctor. °· Do not drive for 24 hours if you were given a medicine to help you relax (sedative). °· Do not drive or use heavy machinery while taking prescription pain medicine. °General instructions ° °· Go back to your normal activities as told by your doctor, usually in about a week. Ask your doctor what activities are safe for you. °· If the insertion area starts to bleed, lie flat and put  pressure on the area. If the bleeding does not stop, get help right away. This is an emergency. °· Drink enough fluid to keep your pee (urine) clear or pale yellow. °· Take over-the-counter and prescription medicines only as told by your doctor. °· Keep all follow-up visits as told by your doctor. This is important. °Contact a doctor if: °· You have a fever. °· You have chills. °· You have more redness, swelling, or pain around your insertion area. °· You have fluid or blood coming from your insertion area. °· The insertion area feels warm to the touch. °· You have pus or a bad smell coming from your insertion area. °· You have more bruising around the insertion area. °· Blood collects in the tissue around the insertion area (hematoma) that may be painful to the touch. °Get help right away if: °· You have a lot of pain in the insertion area. °· The insertion area swells very fast. °· The insertion area is bleeding, and the bleeding does not stop after holding steady pressure on the area. °· The area near or just beyond the insertion area becomes pale, cool, tingly, or numb. °These symptoms may be an emergency. Do not wait to see if the symptoms will go away. Get medical help right away. Call your local emergency services (911 in the [place] Do not drive yourself to the hospital. °  Summary °· After the procedure, it is common to have bruising and tenderness at the long, thin tube insertion area. °· After the procedure, it is important to rest and drink plenty of fluids. °· Do not take baths, swim, or use a hot tub until your doctor says it is okay to do so. You may shower 24-48 hours after the procedure or as told by your doctor. °· If the insertion area starts to bleed, lie flat and put pressure on the area. If the bleeding does not stop, get help right away. This is an emergency. °This information is not intended to replace advice given to you by your health care provider. Make sure you discuss any questions you have  with your health care provider. °Document Released: 09/21/2008 Document Revised: 06/19/2016 Document Reviewed: 06/19/2016 °Elsevier Interactive Patient Education © 2019 [employer] ° ° ° °Moderate Conscious Sedation, Adult, Care After °These instructions provide you with information about caring for yourself after your procedure. Your health care provider may also give you more specific instructions. Your treatment has been planned according to current medical practices, but problems sometimes occur. Call your health care provider if you have any problems or questions after your procedure. °What can I expect after the procedure? °After your procedure, it is common: °· To feel sleepy for several hours. °· To feel clumsy and have poor balance for several hours. °· To have poor judgment for several hours. °· To vomit if you eat too soon. °Follow these instructions at home: °For at least 24 hours after the procedure: ° °· Do not: °? Participate in activities where you could fall or become injured. °? Drive. °? Use heavy machinery. °? Drink alcohol. °? Take sleeping pills or medicines that cause drowsiness. °? Make important decisions or sign legal documents. °? Take care of children on your own. °· Rest. °Eating and drinking °· Follow the diet recommended by your health care provider. °· If you vomit: °? Drink water, juice, or soup when you can drink without vomiting. °? Make sure you have little or no nausea before eating solid foods. °General instructions °· Have a responsible adult stay with you until you are awake and alert. °· Take over-the-counter and prescription medicines only as told by your health care provider. °· If you smoke, do not smoke without supervision. °· Keep all follow-up visits as told by your health care provider. This is important. °Contact a health care provider if: °· You keep feeling nauseous or you keep vomiting. °· You feel light-headed. °· You develop a rash. °· You have a fever. °Get  help right away if: °· You have trouble breathing. °This information is not intended to replace advice given to you by your health care provider. Make sure you discuss any questions you have with your health care provider. °Document Released: 04/15/2013 Document Revised: 11/28/2015 Document Reviewed: 10/15/2015 °Elsevier Interactive Patient Education © 2019 Elsevier Inc. ° °

## 2018-09-25 NOTE — Op Note (Signed)
[facility]  Percutaneous Study/Intervention Procedural Note   Date of Surgery: 09/25/2018  Surgeon(s):DEW,JASON    Assistants:none  Pre-operative Diagnosis: PAD with claudication bilateral lower extremities right greater than left  Post-operative diagnosis:  Same  Procedure(s) Performed:             1.  Ultrasound guidance for vascular access left femoral artery             2.  Catheter placement into right SFA from left femoral approach             3.  Aortogram and selective right lower extremity angiogram             4.  Percutaneous transluminal angioplasty of right anterior tibial artery with 4 mm diameter by 10 cm length Lutonix drug-coated angioplasty balloon             5.   Percutaneous transluminal angioplasty of the right SFA and popliteal artery with 5 mm diameter by 30 cm length Lutonix drug-coated angioplasty balloon  6.  Stent placement to the right SFA with 6 mm diameter by 8 cm length self-expanding stent for greater than 50% residual stenosis after angioplasty in this location  7.  Percutaneous transluminal angioplasty of the right external iliac artery in the mid to distal external iliac artery with 7 mm diameter by 6 cm length Lutonix drug-coated angioplasty balloon  8.  Covered stent placement to the right common iliac artery with 9 mm diameter by 38 mm length lifestream stent  9.  Percutaneous transluminal angioplasty of the left mid to distal common iliac artery and proximal external iliac artery with 7 mm diameter by 8 cm length Lutonix drug-coated angioplasty balloon  10.  Life star stent placement to the left distal common iliac and proximal left external iliac artery with 8 mm diameter by 4 cm length stent             11.  StarClose closure device left femoral artery  EBL: 15 cc  Contrast: 130 cc  Fluoro Time: 14.7 minutes  Moderate Conscious Sedation Time: approximately 60 minutes using 3 mg of Versed and 100 Mcg of Fentanyl               Indications:  Patient is a 63 y.o.male with disabling claudication symptoms worse in the right leg. The patient has noninvasive study showing reduced perfusion worse in the right leg. The patient is brought in for angiography for further evaluation and potential treatment.   Risks and benefits are discussed and informed consent is obtained.   Procedure:  The patient was identified and appropriate procedural time out was performed.  The patient was then placed supine on the table and prepped and draped in the usual sterile fashion. Moderate conscious sedation was administered during a face to face encounter with the patient throughout the procedure with my supervision of the RN administering medicines and monitoring the patient's vital signs, pulse oximetry, telemetry and mental status throughout from the start of the procedure until the patient was taken to the recovery room. Ultrasound was used to evaluate the left common femoral artery.  It was patent but diseased.  A digital ultrasound image was acquired.  A Seldinger needle was used to access the left common femoral artery under direct ultrasound guidance and a permanent image was performed.  A 0.035 J wire was advanced without resistance and a 5Fr sheath was placed.  Pigtail catheter was placed into the aorta and  an AP aortogram was performed. This demonstrated mild to moderate stenosis of the renal arteries in the 40 to 60% range on the right and in the 30 to 40% range on the left and normal aorta without significant stenosis.  The iliac arteries had significant disease bilaterally.  On the left, there was about a 50 to 60% stenosis in the common iliac artery and about a 70% stenosis at the origin of the external iliac artery.  On the right, there was a moderate stenosis in the 60% range associated with an enlarged atherosclerotic plaque in the common iliac artery.  The vessel then normalized for several centimeters and in the mid to distal external  iliac artery was a very calcific lesion creating about a 60% stenosis.  Both hypogastric arteries were very diminutive.  I then crossed the aortic bifurcation and advanced to the right femoral head.  Catheter was then advanced down to the proximal superficial femoral artery to help opacify distally.  Selective right lower extremity angiogram was then performed. This demonstrated reasonably normal common femoral artery, profunda femoris artery, and proximal superficial femoral artery.  The mid superficial femoral artery had some moderate calcific disease in the 50 to 60% range.  In the distal superficial femoral artery there was a high-grade stenosis of 90 to 95%.  There was then a calcific lesion in the above-knee popliteal artery creating moderate stenosis in the 60% range.  There was a normal tibial trifurcation with about an 80% stenosis of the proximal anterior tibial artery with a vessel that then normalized and was continuous to the foot.  The posterior tibial artery was large and continuous in the foot without focal stenosis. It was felt that it was in the patient's best interest to proceed with intervention after these images to avoid a second procedure and a larger amount of contrast and fluoroscopy based off of the findings from the initial angiogram. The patient was systemically heparinized and a 6 [place] Ansell sheath was then placed over the [employer] wire. I then used a Kumpe catheter and the advantage wire to navigate through the SFA and popliteal lesions and down to the below-knee popliteal artery.  I then advanced into the anterior tibial artery across the stenosis without difficulty with the advantage wire and the Kumpe catheter.  Intraluminal flow below the lesion in the anterior tibial arteries confirmed and I switched out for an 018 wire.  The generous anterior tibial artery was then treated with a 4 mm diameter by 10 cm length Lutonix drug-coated angioplasty balloon inflated to 8 atm for  1 minute.  Completion imaging showed markedly improved flow with less than 20% residual stenosis in the anterior tibial artery.  I then used a 5 mm diameter by 30 cm length Lutonix drug-coated angioplasty balloon to treat from just above the knee in the popliteal artery up through the mid SFA.  This encompassed all lesions in this location.  Completion imaging showed the popliteal lesion to have a less than 20% residual stenosis in the mid SFA to have less than 20% residual stenosis, but the distal SFA and the very high-grade lesion remained greater than 70%.  I then placed a 6 mm diameter by 8 cm length life stent in this location.  This was postdilated with 5, 6, and 7 mm diameter balloons including a high-pressure balloon which then resulted in less than 20% residual stenosis.  I then turned my attention to the iliac lesions.  The right external iliac artery lesion  was treated with a 7 mm diameter by 6 cm length Lutonix drug-coated angioplasty balloon inflated to 12 atm for 1 minute.  Completion imaging showed about a 20 to 25% residual stenosis after angioplasty.  I then exchanged for a 7 French sheath and elected to place a lifestream stent to encompass the right common iliac lesion that included the very irregular atherosclerotic plaque that look like a small aneurysm or ectasia in addition to the stenosis.  A 9 mm diameter by 38 mm length lifestream stent was then deployed across the lesion staying just above the hypogastric artery and starting about half a centimeter below the origin of the common iliac artery.  This was inflated to 10 atm with excellent angiographic completion result and less than 10% residual stenosis.  I then addressed the left iliac lesions.  Both the mid and distal common iliac artery lesion and the external iliac artery lesion were addressed with a 7 mm diameter by 8 cm length Lutonix drug-coated angioplasty balloon inflated to 12 atm for 1 minute.  Completion imaging showed about a  30% residual stenosis in the common iliac artery but a greater than 50% residual stenosis at the origin of the external iliac artery.  An 8 mm diameter by 4 cm length life star stent was then deployed from the distal left common iliac artery down across the proximal external iliac artery and was postdilated with an 8 mm balloon with excellent angiographic completion result and only about 10% residual stenosis. I elected to terminate the procedure. The sheath was removed and StarClose closure device was deployed in the left femoral artery with excellent hemostatic result. The patient was taken to the recovery room in stable condition having tolerated the procedure well.  Findings:               Aortogram:  This demonstrated mild to moderate stenosis of the renal arteries in the 40 to 60% range on the right and in the 30 to 40% range on the left and normal aorta without significant stenosis.  The iliac arteries had significant disease bilaterally.  On the left, there was about a 50 to 60% stenosis in the common iliac artery and about a 70% stenosis at the origin of the external iliac artery.  On the right, there was a moderate stenosis in the 60% range associated with an enlarged atherosclerotic plaque in the common iliac artery.  The vessel then normalized for several centimeters and in the mid to distal external iliac artery was a very calcific lesion creating about a 60% stenosis.  Both hypogastric arteries were very diminutive.             Right lower Extremity:  Reasonably normal common femoral artery, profunda femoris artery, and proximal superficial femoral artery.  The mid superficial femoral artery had some moderate calcific disease in the 50 to 60% range.  In the distal superficial femoral artery there was a high-grade stenosis of 90 to 95%.  There was then a calcific lesion in the above-knee popliteal artery creating moderate stenosis in the 60% range.  There was a normal tibial trifurcation with about  an 80% stenosis of the proximal anterior tibial artery with a vessel that then normalized and was continuous to the foot.  The posterior tibial artery was large and continuous in the foot without focal stenosis.   Disposition: Patient was taken to the recovery room in stable condition having tolerated the procedure well.  Complications: None  [name] 09/25/2018 9:43  AM   This note was created with Dragon Medical transcription system. Any errors in dictation are purely unintentional.

## 2018-09-25 NOTE — Patient Outreach (Signed)
[facility]) Care Management  09/25/2018  Aaron [name] 09-Jun-1956 [id number]   Telephone Screen  Referral Date:09/24/2018 Referral Source: [employer] Concierge Referral [name]: " member requesting information and assistance with transportation to MD appts, assistance with help for meds" Insurance: HTA   Outreach attempt #2 to patient. No answer at present and voicemail not set up.     Plan: RN CM will make outreach attempt to patient within 3-4 business days.   [name], RN,BSN,CCM [facility] Telephonic Care Management Coordinator Direct Phone: [phone] Toll Free: [phone] Fax: [phone]

## 2018-09-25 NOTE — H&P (Signed)
[facility] History & Physical Update  The patient was interviewed and re-examined.  The patient's previous History and Physical has been reviewed and is unchanged.  There is no change in the plan of care. We plan to proceed with the scheduled procedure.  [name], MD  09/25/2018, 8:18 AM

## 2018-09-26 ENCOUNTER — Other Ambulatory Visit: Payer: Self-pay

## 2018-09-26 NOTE — Patient Outreach (Signed)
[facility]) Care Management  09/26/2018  Aaron [name] March 11, 1956 [id number]    Telephone Screen  Referral Date:09/24/2018 Referral Source:[employer] Concierge Referral Reason:" member requesting information and assistance with transportation to MD appts, assistance with help for meds" Insurance:HTA   Outreach attempt #3 to patient. No answer at present and no voicemail set up.     Plan: RN CM will close case if no response from letter mailed to patient.    [name], RN,BSN,CCM [facility] Telephonic Care Management Coordinator Direct Phone: [phone] Toll Free: [phone] Fax: [phone]

## 2018-10-08 ENCOUNTER — Other Ambulatory Visit: Payer: Self-pay

## 2018-10-08 NOTE — Patient Outreach (Signed)
[facility]) Care Management  10/08/2018  Aaron [name] 28-Sep-1955 [id number]   Telephone Screen  Referral Date:09/24/2018 Referral Source:[employer] Concierge Referral Reason:" member requesting information and assistance with transportation to MD appts, assistance with help for meds" Insurance:HTA  Multiple attempts to establish contact with patient without success. No response from letter mailed to patient. Case is being closed at this time.    Plan: RN CM will close case at this time.  [name], RN,BSN,CCM [facility] Telephonic Care Management Coordinator Direct Phone: [phone] Toll Free: [phone] Fax: [phone]

## 2018-10-21 ENCOUNTER — Other Ambulatory Visit (INDEPENDENT_AMBULATORY_CARE_PROVIDER_SITE_OTHER): Payer: Self-pay | Admitting: Vascular Surgery

## 2018-10-21 DIAGNOSIS — I70211 Atherosclerosis of native arteries of extremities with intermittent claudication, right leg: Secondary | ICD-10-CM

## 2018-10-21 DIAGNOSIS — Z95820 Peripheral vascular angioplasty status with implants and grafts: Secondary | ICD-10-CM

## 2018-10-22 ENCOUNTER — Ambulatory Visit: Payer: Self-pay

## 2018-10-24 ENCOUNTER — Ambulatory Visit (INDEPENDENT_AMBULATORY_CARE_PROVIDER_SITE_OTHER): Payer: PPO | Admitting: Vascular Surgery

## 2018-10-24 ENCOUNTER — Encounter (INDEPENDENT_AMBULATORY_CARE_PROVIDER_SITE_OTHER): Payer: PPO

## 2018-10-28 ENCOUNTER — Other Ambulatory Visit: Payer: Self-pay

## 2018-10-28 ENCOUNTER — Encounter: Payer: Self-pay | Admitting: Internal Medicine

## 2018-10-28 ENCOUNTER — Ambulatory Visit (INDEPENDENT_AMBULATORY_CARE_PROVIDER_SITE_OTHER): Payer: PPO | Admitting: Internal Medicine

## 2018-10-28 VITALS — BP 108/64 | HR 62 | Ht 73.0 in | Wt 191.3 lb

## 2018-10-28 DIAGNOSIS — Z0000 Encounter for general adult medical examination without abnormal findings: Secondary | ICD-10-CM | POA: Diagnosis not present

## 2018-10-28 DIAGNOSIS — I739 Peripheral vascular disease, unspecified: Secondary | ICD-10-CM

## 2018-10-28 DIAGNOSIS — Z23 Encounter for immunization: Secondary | ICD-10-CM

## 2018-10-28 DIAGNOSIS — Z1211 Encounter for screening for malignant neoplasm of colon: Secondary | ICD-10-CM

## 2018-10-28 DIAGNOSIS — E782 Mixed hyperlipidemia: Secondary | ICD-10-CM | POA: Insufficient documentation

## 2018-10-28 DIAGNOSIS — Z125 Encounter for screening for malignant neoplasm of prostate: Secondary | ICD-10-CM

## 2018-10-28 DIAGNOSIS — R739 Hyperglycemia, unspecified: Secondary | ICD-10-CM

## 2018-10-28 DIAGNOSIS — I10 Essential (primary) hypertension: Secondary | ICD-10-CM

## 2018-10-28 DIAGNOSIS — F17200 Nicotine dependence, unspecified, uncomplicated: Secondary | ICD-10-CM

## 2018-10-28 LAB — POCT URINALYSIS DIPSTICK
Bilirubin, UA: NEGATIVE
Glucose, UA: NEGATIVE
Ketones, UA: NEGATIVE
Leukocytes, UA: NEGATIVE
Nitrite, UA: NEGATIVE
Protein, UA: NEGATIVE
Spec Grav, UA: 1.01 (ref 1.010–1.025)
Urobilinogen, UA: 0.2 E.U./dL
pH, UA: 5 (ref 5.0–8.0)

## 2018-10-28 MED ORDER — ZOSTER VAC RECOMB ADJUVANTED 50 MCG/0.5ML IM SUSR: 0.5000 mL | Freq: Once | INTRAMUSCULAR | 1 refills | Status: AC

## 2018-10-28 NOTE — Progress Notes (Signed)
Date:  10/28/2018   Name:  Aaron [name]   DOB:  02-Jul-1956   MRN:  [id number]   Chief Complaint: Annual Exam Aaron [name] is a 63 y.o. male who presents today for his Complete Annual Exam. He feels well. He reports exercising walking the dog daily. He reports he is sleeping well.  Colonoscopy 2014 - due for repeat  Hypertension  This is a chronic problem. The problem is controlled. Pertinent negatives include no chest pain, headaches, palpitations or shortness of breath.  Hyperlipidemia  This is a chronic problem. The problem is controlled. Pertinent negatives include no chest pain, myalgias or shortness of breath. Current antihyperlipidemic treatment includes statins. The current treatment provides moderate improvement of lipids.  Diabetes  He presents for his follow-up diabetic visit. Diabetes type: pre-diabetes. Pertinent negatives for hypoglycemia include no dizziness or headaches. Pertinent negatives for diabetes include no chest pain, no fatigue and no weakness.  Nicotine Dependence  Presents for follow-up visit. Symptoms are negative for fatigue. His urge triggers include company of smokers. The symptoms have been stable. He smokes < 1/2 a pack of cigarettes per day. Compliance with prior treatments: has not yet tried Chantix.  CAD - followed by cardiology,doing well.  PVD - s/p left iliac stent and right iliac and SFA stents.  He is walking and having no claudication symptoms.  Lab Results  Component Value Date   HGBA1C 5.9 (H) 10/23/2017   Lab Results  Component Value Date   CHOL 140 10/23/2017   HDL 32 (L) 10/23/2017   LDLCALC 81 10/23/2017   TRIG 135 10/23/2017   CHOLHDL 4.4 10/23/2017   Lab Results  Component Value Date   CREATININE 0.88 09/24/2018   BUN 19 09/24/2018   NA 135 09/15/2018   K 3.8 09/15/2018   CL 103 09/15/2018   CO2 22 09/15/2018     Review of Systems  Constitutional: Negative for appetite change, chills, diaphoresis, fatigue and  unexpected weight change.  HENT: Negative for hearing loss, tinnitus, trouble swallowing and voice change.   Eyes: Positive for itching. Negative for visual disturbance.  Respiratory: Positive for cough. Negative for choking, shortness of breath and wheezing.   Cardiovascular: Negative for chest pain, palpitations and leg swelling.  Gastrointestinal: Negative for abdominal pain, blood in stool, constipation and diarrhea.  Genitourinary: Negative for decreased urine volume, difficulty urinating, dysuria, frequency and hematuria.  Musculoskeletal: Negative for arthralgias, back pain and myalgias.  Skin: Negative for color change and rash.  Allergic/Immunologic: Positive for environmental allergies.  Neurological: Negative for dizziness, syncope, weakness, numbness and headaches.  Hematological: Negative for adenopathy.  Psychiatric/Behavioral: Negative for dysphoric mood and sleep disturbance.    Patient Active Problem List   Diagnosis Date Noted  . Mixed hyperlipidemia 10/28/2018  . Fracture of left ankle, closed, initial encounter 10/23/2017  . Syncope 09/16/2017  . Pain in limb 07/13/2016  . Tobacco use disorder 07/13/2016  . Elevated blood sugar 07/27/2015  . Controlled gout 12/22/2014  . CAD in native artery 10/09/2014  . Essential (primary) hypertension 10/09/2014  . Dupuytren's disease of palm 10/09/2014  . Psoriasis 10/09/2014  . Pain in shoulder 10/09/2014  . H/O cardiac catheterization 01/27/2014  . Peripheral vascular disease ([place]) 01/27/2014    No Known Allergies  Past Surgical History:  Procedure Laterality Date  . APPENDECTOMY    . cardiac stents   2012  . COLONOSCOPY  2014  . LOWER EXTREMITY ANGIOGRAPHY Right 09/25/2018   Procedure: LOWER EXTREMITY ANGIOGRAPHY;  Surgeon: Algernon Huxley, MD;  Location: [facility];  Service: Cardiovascular;  Laterality: Right;    Social History   Tobacco Use  . Smoking status: Current Every Day Smoker    Packs/day:  0.50    Types: Cigars    Start date: 09/03/1976  . Smokeless tobacco: Never Used  Substance Use Topics  . Alcohol use: Not Currently    Alcohol/week: 1.0 standard drinks    Types: 1 Cans of beer per week  . Drug use: No     Medication list has been reviewed and updated.  Current Meds  Medication Sig  . aspirin 325 MG EC tablet Take 1 tablet by mouth daily.  . cilostazol (PLETAL) 50 MG tablet Take 1 tablet by mouth 2 (two) times daily.   . clopidogrel (PLAVIX) 75 MG tablet Take 1 tablet by mouth daily.  . indomethacin (INDOCIN) 25 MG capsule Take 1 capsule (25 mg total) by mouth 2 (two) times daily as needed.  . isosorbide mononitrate (IMDUR) 60 MG 24 hr tablet Take 90 mg by mouth daily.   [name] lisinopril (PRINIVIL,ZESTRIL) 10 MG tablet Take 1 tablet by mouth daily.  [name] METOPROLOL TARTRATE PO Take by mouth. Take 50MG  by mouth every morning and 75MG  every evening  . nitroGLYCERIN (NITROSTAT) 0.4 MG SL tablet Place 1 tablet under the tongue as needed.  . simvastatin (ZOCOR) 40 MG tablet Take 1 tablet (40 mg total) by mouth at bedtime.    PHQ 2/9 Scores 10/28/2018 09/03/2018 10/17/2017 10/17/2017  PHQ - 2 Score 0 0 0 0  PHQ- 9 Score - - 0 -    BP Readings from Last 3 Encounters:  10/28/18 108/64  09/25/18 108/61  09/24/18 125/75    Physical Exam Vitals signs and nursing note reviewed.  Constitutional:      Appearance: Normal appearance. He is well-developed.  HENT:     Head: Normocephalic.     Right Ear: Tympanic membrane, ear canal and external ear normal.     Left Ear: Tympanic membrane, ear canal and external ear normal.     Nose: Nose normal.     Mouth/Throat:     Pharynx: Uvula midline.  Eyes:     Conjunctiva/sclera: Conjunctivae normal.     Pupils: Pupils are equal, round, and reactive to light.  Neck:     Musculoskeletal: Normal range of motion and neck supple.     Thyroid: No thyromegaly.     Vascular: No carotid bruit.  Cardiovascular:     Rate and Rhythm: Normal  rate and regular rhythm.     Pulses: Normal pulses.     Heart sounds: Normal heart sounds.  Pulmonary:     Effort: Pulmonary effort is normal.     Breath sounds: Normal breath sounds. No wheezing.  Chest:     Breasts:        Right: No mass.        Left: No mass.  Abdominal:     General: Bowel sounds are normal.     Palpations: Abdomen is soft.     Tenderness: There is no abdominal tenderness.  Musculoskeletal: Normal range of motion.     Right lower leg: No edema.     Left lower leg: No edema.  Lymphadenopathy:     Cervical: No cervical adenopathy.  Skin:    General: Skin is warm and dry.  Neurological:     Mental Status: He is alert and oriented to person, place, and time.  Deep Tendon Reflexes: Reflexes are normal and symmetric.  Psychiatric:        Speech: Speech normal.        Behavior: Behavior normal.        Thought Content: Thought content normal.        Judgment: Judgment normal.     Wt Readings from Last 3 Encounters:  10/28/18 191 lb 4.8 oz (86.8 kg)  09/25/18 189 lb 9.5 oz (86 kg)  09/24/18 188 lb 12.8 oz (85.6 kg)    BP 108/64   Pulse 62   Ht 6\' 1"  (1.854 m)   Wt 191 lb 4.8 oz (86.8 kg)   SpO2 97%   BMI 25.24 kg/m   Assessment and Plan: 1. Annual physical exam Continue regular exercise - POCT urinalysis dipstick  2. Prostate cancer screening DRE deferred - PSA  3. Colon cancer screening Referred to Dr. Gustavo Lah for repeat  4. Essential (primary) hypertension controlled - CBC with Differential/Platelet - Comprehensive metabolic panel  5. Peripheral vascular disease ([place]) Doing well s/p PTCA and stents  6. Mixed hyperlipidemia On statin therapy - Lipid panel  7. Elevated blood sugar Check labs - Hemoglobin A1c  8. Need for shingles vaccine - Zoster Vaccine Adjuvanted [facility]) injection; Inject 0.5 mLs into the muscle once for 1 dose.  Dispense: 0.5 mL; Refill: 1  9. Tobacco use disorder Pt has chantix Rx - he is thinking  about starting it   Partially dictated using Editor, commissioning. Any errors are unintentional.  Halina Maidens, MD [facility]  10/28/2018

## 2018-10-28 NOTE — Patient Instructions (Signed)
This information is directly available on the CDC website: https://www.cdc.gov/coronavirus/2019-ncov/if-you-are-sick/steps-when-sick.html    Source:CDC Reference to specific commercial products, manufacturers, companies, or trademarks does not constitute its endorsement or recommendation by the U.S. Government, Department of Health and Human Services, or Centers for Disease Control and Prevention.  

## 2018-10-29 LAB — COMPREHENSIVE METABOLIC PANEL
ALT: 27 IU/L (ref 0–44)
AST: 22 IU/L (ref 0–40)
Albumin/Globulin Ratio: 2.2 (ref 1.2–2.2)
Albumin: 4.8 g/dL (ref 3.8–4.8)
Alkaline Phosphatase: 86 IU/L (ref 39–117)
BUN/Creatinine Ratio: 15 (ref 10–24)
BUN: 15 mg/dL (ref 8–27)
Bilirubin Total: 0.4 mg/dL (ref 0.0–1.2)
CO2: 25 mmol/L (ref 20–29)
Calcium: 9.6 mg/dL (ref 8.6–10.2)
Chloride: 99 mmol/L (ref 96–106)
Creatinine, Ser: 0.97 mg/dL (ref 0.76–1.27)
GFR calc Af Amer: 96 mL/min/{1.73_m2} (ref >59)
GFR calc non Af Amer: 83 mL/min/{1.73_m2} (ref >59)
Globulin, Total: 2.2 g/dL (ref 1.5–4.5)
Glucose: 108 mg/dL — ABNORMAL HIGH (ref 65–99)
Potassium: 5.2 mmol/L (ref 3.5–5.2)
Sodium: 139 mmol/L (ref 134–144)
Total Protein: 7 g/dL (ref 6.0–8.5)

## 2018-10-29 LAB — CBC WITH DIFFERENTIAL/PLATELET
Basophils Absolute: 0 10*3/uL (ref 0.0–0.2)
Basos: 1 %
EOS (ABSOLUTE): 0.1 10*3/uL (ref 0.0–0.4)
Eos: 2 %
Hematocrit: 49.7 % (ref 37.5–51.0)
Hemoglobin: 16.7 g/dL (ref 13.0–17.7)
Immature Grans (Abs): 0 10*3/uL (ref 0.0–0.1)
Immature Granulocytes: 0 %
Lymphocytes Absolute: 2.3 10*3/uL (ref 0.7–3.1)
Lymphs: 29 %
MCH: 31.3 pg (ref 26.6–33.0)
MCHC: 33.6 g/dL (ref 31.5–35.7)
MCV: 93 fL (ref 79–97)
Monocytes Absolute: 0.5 10*3/uL (ref 0.1–0.9)
Monocytes: 6 %
Neutrophils Absolute: 4.9 10*3/uL (ref 1.4–7.0)
Neutrophils: 62 %
Platelets: 215 10*3/uL (ref 150–450)
RBC: 5.33 x10E6/uL (ref 4.14–5.80)
RDW: 13 % (ref 11.6–15.4)
WBC: 7.9 10*3/uL (ref 3.4–10.8)

## 2018-10-29 LAB — LIPID PANEL
Chol/HDL Ratio: 4 ratio (ref 0.0–5.0)
Cholesterol, Total: 139 mg/dL (ref 100–199)
HDL: 35 mg/dL — ABNORMAL LOW (ref >39)
LDL Calculated: 84 mg/dL (ref 0–99)
Triglycerides: 100 mg/dL (ref 0–149)
VLDL Cholesterol Cal: 20 mg/dL (ref 5–40)

## 2018-10-29 LAB — HEMOGLOBIN A1C
Est. average glucose Bld gHb Est-mCnc: 114 mg/dL
Hgb A1c MFr Bld: 5.6 % (ref 4.8–5.6)

## 2018-10-29 LAB — PSA: Prostate Specific Ag, Serum: 1.5 ng/mL (ref 0.0–4.0)

## 2018-11-21 ENCOUNTER — Ambulatory Visit (INDEPENDENT_AMBULATORY_CARE_PROVIDER_SITE_OTHER): Payer: PPO | Admitting: Nurse Practitioner

## 2018-11-21 ENCOUNTER — Encounter (INDEPENDENT_AMBULATORY_CARE_PROVIDER_SITE_OTHER): Payer: PPO

## 2018-12-12 DIAGNOSIS — R51 Headache: Secondary | ICD-10-CM | POA: Diagnosis not present

## 2018-12-12 DIAGNOSIS — H40003 Preglaucoma, unspecified, bilateral: Secondary | ICD-10-CM | POA: Diagnosis not present

## 2018-12-16 DIAGNOSIS — R079 Chest pain, unspecified: Secondary | ICD-10-CM | POA: Diagnosis not present

## 2018-12-16 DIAGNOSIS — F17200 Nicotine dependence, unspecified, uncomplicated: Secondary | ICD-10-CM | POA: Diagnosis not present

## 2018-12-16 DIAGNOSIS — E7849 Other hyperlipidemia: Secondary | ICD-10-CM | POA: Diagnosis not present

## 2018-12-16 DIAGNOSIS — Z9889 Other specified postprocedural states: Secondary | ICD-10-CM | POA: Diagnosis not present

## 2018-12-16 DIAGNOSIS — I739 Peripheral vascular disease, unspecified: Secondary | ICD-10-CM | POA: Diagnosis not present

## 2018-12-16 DIAGNOSIS — I10 Essential (primary) hypertension: Secondary | ICD-10-CM | POA: Diagnosis not present

## 2018-12-16 DIAGNOSIS — E785 Hyperlipidemia, unspecified: Secondary | ICD-10-CM | POA: Diagnosis not present

## 2018-12-16 DIAGNOSIS — I2510 Atherosclerotic heart disease of native coronary artery without angina pectoris: Secondary | ICD-10-CM | POA: Diagnosis not present

## 2018-12-24 DIAGNOSIS — H40033 Anatomical narrow angle, bilateral: Secondary | ICD-10-CM | POA: Diagnosis not present

## 2018-12-31 ENCOUNTER — Ambulatory Visit: Payer: Self-pay

## 2019-01-08 ENCOUNTER — Telehealth: Payer: Self-pay | Admitting: Internal Medicine

## 2019-01-08 NOTE — Telephone Encounter (Signed)
Patient is having some congestion with no other symptoms.

## 2019-01-08 NOTE — Telephone Encounter (Signed)
We do not work this afternoon or tomorrow. We are closed tomorrow and will not be back in office until Monday. Tell patient he may need to try over the counter medications and if no better to see UC over the weekend.  Thank you.

## 2019-01-12 ENCOUNTER — Ambulatory Visit: Payer: PPO | Admitting: Internal Medicine

## 2019-01-12 ENCOUNTER — Other Ambulatory Visit: Payer: Self-pay

## 2019-01-12 ENCOUNTER — Telehealth: Payer: Self-pay | Admitting: Internal Medicine

## 2019-01-12 DIAGNOSIS — M109 Gout, unspecified: Secondary | ICD-10-CM

## 2019-01-12 MED ORDER — INDOMETHACIN 25 MG PO CAPS: 25.0000 mg | ORAL_CAPSULE | Freq: Two times a day (BID) | ORAL | 1 refills | Status: DC | PRN

## 2019-01-12 NOTE — Telephone Encounter (Signed)
Patient refills on indomethacin (INDOCIN) 25 MG capsule [[id number]]  Aaron [name], [place]

## 2019-01-12 NOTE — Telephone Encounter (Signed)
Refill sent in. Thank you.

## 2019-02-02 ENCOUNTER — Telehealth: Payer: Self-pay | Admitting: Internal Medicine

## 2019-02-02 NOTE — Telephone Encounter (Signed)
Patient is having shortness of breath and congestion pains around back area. Advise patient to emergency room due to the symptoms.

## 2019-02-06 ENCOUNTER — Observation Stay
Admission: EM | Admit: 2019-02-06 | Discharge: 2019-02-07 | Disposition: A | Discharge status: Home / Self Care | Payer: PPO | Attending: Internal Medicine | Admitting: Internal Medicine

## 2019-02-06 ENCOUNTER — Emergency Department: Payer: PPO

## 2019-02-06 ENCOUNTER — Encounter: Payer: Self-pay | Admitting: Emergency Medicine

## 2019-02-06 ENCOUNTER — Other Ambulatory Visit: Payer: Self-pay

## 2019-02-06 DIAGNOSIS — I10 Essential (primary) hypertension: Secondary | ICD-10-CM | POA: Insufficient documentation

## 2019-02-06 DIAGNOSIS — Z1159 Encounter for screening for other viral diseases: Secondary | ICD-10-CM | POA: Insufficient documentation

## 2019-02-06 DIAGNOSIS — Z87891 Personal history of nicotine dependence: Secondary | ICD-10-CM | POA: Diagnosis not present

## 2019-02-06 DIAGNOSIS — Z79899 Other long term (current) drug therapy: Secondary | ICD-10-CM | POA: Insufficient documentation

## 2019-02-06 DIAGNOSIS — R079 Chest pain, unspecified: Secondary | ICD-10-CM | POA: Diagnosis not present

## 2019-02-06 DIAGNOSIS — M109 Gout, unspecified: Secondary | ICD-10-CM | POA: Insufficient documentation

## 2019-02-06 DIAGNOSIS — Z8249 Family history of ischemic heart disease and other diseases of the circulatory system: Secondary | ICD-10-CM | POA: Insufficient documentation

## 2019-02-06 DIAGNOSIS — I2510 Atherosclerotic heart disease of native coronary artery without angina pectoris: Secondary | ICD-10-CM | POA: Diagnosis not present

## 2019-02-06 DIAGNOSIS — R05 Cough: Secondary | ICD-10-CM | POA: Diagnosis not present

## 2019-02-06 DIAGNOSIS — Z7902 Long term (current) use of antithrombotics/antiplatelets: Secondary | ICD-10-CM | POA: Insufficient documentation

## 2019-02-06 DIAGNOSIS — Z955 Presence of coronary angioplasty implant and graft: Secondary | ICD-10-CM | POA: Diagnosis not present

## 2019-02-06 DIAGNOSIS — E782 Mixed hyperlipidemia: Secondary | ICD-10-CM | POA: Insufficient documentation

## 2019-02-06 DIAGNOSIS — M720 Palmar fascial fibromatosis [Dupuytren]: Secondary | ICD-10-CM | POA: Diagnosis not present

## 2019-02-06 DIAGNOSIS — I739 Peripheral vascular disease, unspecified: Secondary | ICD-10-CM | POA: Diagnosis not present

## 2019-02-06 DIAGNOSIS — Z7982 Long term (current) use of aspirin: Secondary | ICD-10-CM | POA: Diagnosis not present

## 2019-02-06 DIAGNOSIS — Z20828 Contact with and (suspected) exposure to other viral communicable diseases: Secondary | ICD-10-CM | POA: Diagnosis not present

## 2019-02-06 DIAGNOSIS — I200 Unstable angina: Secondary | ICD-10-CM | POA: Diagnosis not present

## 2019-02-06 HISTORY — DX: Peripheral vascular disease, unspecified: I73.9

## 2019-02-06 LAB — LIPID PANEL
Cholesterol: 121 mg/dL (ref 0–200)
HDL: 31 mg/dL — ABNORMAL LOW (ref >40)
LDL Cholesterol: 62 mg/dL (ref 0–99)
Total CHOL/HDL Ratio: 3.9 RATIO
Triglycerides: 141 mg/dL (ref <150)
VLDL: 28 mg/dL (ref 0–40)

## 2019-02-06 LAB — BASIC METABOLIC PANEL
Anion gap: 9 (ref 5–15)
BUN: 20 mg/dL (ref 8–23)
CO2: 24 mmol/L (ref 22–32)
Calcium: 9.3 mg/dL (ref 8.9–10.3)
Chloride: 100 mmol/L (ref 98–111)
Creatinine, Ser: 0.97 mg/dL (ref 0.61–1.24)
GFR calc Af Amer: >60 mL/min (ref >60)
GFR calc non Af Amer: >60 mL/min (ref >60)
Glucose, Bld: 124 mg/dL — ABNORMAL HIGH (ref 70–99)
Potassium: 4.4 mmol/L (ref 3.5–5.1)
Sodium: 133 mmol/L — ABNORMAL LOW (ref 135–145)

## 2019-02-06 LAB — CBC
HCT: 49.3 % (ref 39.0–52.0)
Hemoglobin: 16.6 g/dL (ref 13.0–17.0)
MCH: 32.2 pg (ref 26.0–34.0)
MCHC: 33.7 g/dL (ref 30.0–36.0)
MCV: 95.7 fL (ref 80.0–100.0)
Platelets: 183 10*3/uL (ref 150–400)
RBC: 5.15 MIL/uL (ref 4.22–5.81)
RDW: 13.4 % (ref 11.5–15.5)
WBC: 8.3 10*3/uL (ref 4.0–10.5)
nRBC: 0 % (ref 0.0–0.2)

## 2019-02-06 LAB — TROPONIN I (HIGH SENSITIVITY)
Troponin I (High Sensitivity): <2 ng/L (ref <18)
Troponin I (High Sensitivity): <2 ng/L (ref <18)
Troponin I (High Sensitivity): <2 ng/L (ref <18)
Troponin I (High Sensitivity): <2 ng/L (ref <18)

## 2019-02-06 LAB — SARS CORONAVIRUS 2 BY RT PCR (HOSPITAL ORDER, PERFORMED IN ~~LOC~~ HOSPITAL LAB): SARS Coronavirus 2: NEGATIVE

## 2019-02-06 LAB — TSH: TSH: 3.214 u[IU]/mL (ref 0.350–4.500)

## 2019-02-06 MED ORDER — ACETAMINOPHEN 650 MG RE SUPP: 650.0000 mg | Freq: Four times a day (QID) | RECTAL | Status: DC | PRN

## 2019-02-06 MED ORDER — ENOXAPARIN SODIUM 40 MG/0.4ML ~~LOC~~ SOLN
40.0000 mg | SUBCUTANEOUS | Status: DC
  Administered 2019-02-06: 40 mg via SUBCUTANEOUS
  Filled 2019-02-06: qty 0.4

## 2019-02-06 MED ORDER — ACETAMINOPHEN 325 MG PO TABS
650.0000 mg | ORAL_TABLET | Freq: Four times a day (QID) | ORAL | Status: DC | PRN
  Administered 2019-02-06: 650 mg via ORAL
  Filled 2019-02-06: qty 2

## 2019-02-06 MED ORDER — ONDANSETRON HCL 4 MG PO TABS: 4.0000 mg | ORAL_TABLET | Freq: Four times a day (QID) | ORAL | Status: DC | PRN

## 2019-02-06 MED ORDER — ISOSORBIDE MONONITRATE ER 60 MG PO TB24
90.0000 mg | ORAL_TABLET | Freq: Every day | ORAL | Status: DC
  Administered 2019-02-07: 90 mg via ORAL
  Filled 2019-02-06: qty 2

## 2019-02-06 MED ORDER — ASPIRIN EC 325 MG PO TBEC
325.0000 mg | DELAYED_RELEASE_TABLET | Freq: Every day | ORAL | Status: DC
  Administered 2019-02-07: 325 mg via ORAL
  Filled 2019-02-06: qty 1

## 2019-02-06 MED ORDER — NITROGLYCERIN 2 % TD OINT
2.0000 [in_us] | TOPICAL_OINTMENT | Freq: Once | TRANSDERMAL | Status: AC
  Administered 2019-02-06: 2 [in_us] via TOPICAL
  Filled 2019-02-06: qty 1

## 2019-02-06 MED ORDER — ONDANSETRON HCL 4 MG/2ML IJ SOLN: 4.0000 mg | Freq: Four times a day (QID) | INTRAMUSCULAR | Status: DC | PRN

## 2019-02-06 MED ORDER — SODIUM CHLORIDE 0.9% FLUSH
3.0000 mL | Freq: Once | INTRAVENOUS | Status: AC
  Administered 2019-02-06: 3 mL via INTRAVENOUS

## 2019-02-06 MED ORDER — NITROGLYCERIN 0.4 MG SL SUBL
0.4000 mg | SUBLINGUAL_TABLET | SUBLINGUAL | Status: DC | PRN
  Administered 2019-02-06: 0.4 mg via SUBLINGUAL
  Filled 2019-02-06 (×3): qty 1

## 2019-02-06 MED ORDER — TRAZODONE HCL 50 MG PO TABS
50.0000 mg | ORAL_TABLET | Freq: Every evening | ORAL | Status: DC | PRN
  Administered 2019-02-06: 50 mg via ORAL
  Filled 2019-02-06: qty 1

## 2019-02-06 MED ORDER — METOPROLOL TARTRATE 50 MG PO TABS
50.0000 mg | ORAL_TABLET | Freq: Two times a day (BID) | ORAL | Status: DC
  Administered 2019-02-06 – 2019-02-07 (×2): 50 mg via ORAL
  Filled 2019-02-06 (×2): qty 1

## 2019-02-06 MED ORDER — CLOPIDOGREL BISULFATE 75 MG PO TABS
75.0000 mg | ORAL_TABLET | Freq: Every day | ORAL | Status: DC
  Administered 2019-02-07: 75 mg via ORAL
  Filled 2019-02-06: qty 1

## 2019-02-06 MED ORDER — MORPHINE SULFATE (PF) 2 MG/ML IV SOLN: 2.0000 mg | Freq: Four times a day (QID) | INTRAVENOUS | Status: DC | PRN

## 2019-02-06 MED ORDER — LISINOPRIL 10 MG PO TABS
10.0000 mg | ORAL_TABLET | Freq: Every day | ORAL | Status: DC
  Administered 2019-02-07: 10 mg via ORAL
  Filled 2019-02-06: qty 1

## 2019-02-06 MED ORDER — CILOSTAZOL 100 MG PO TABS
50.0000 mg | ORAL_TABLET | Freq: Two times a day (BID) | ORAL | Status: DC
  Administered 2019-02-06 – 2019-02-07 (×2): 50 mg via ORAL
  Filled 2019-02-06 (×3): qty 0.5

## 2019-02-06 MED ORDER — SIMVASTATIN 10 MG PO TABS
40.0000 mg | ORAL_TABLET | Freq: Every day | ORAL | Status: DC
  Administered 2019-02-06: 40 mg via ORAL
  Filled 2019-02-06 (×2): qty 4

## 2019-02-06 NOTE — H&P (Signed)
[facility] [place] NAME: Aaron [name]    MR#:  [id number]  DATE OF BIRTH:  Jun 29, 1956  DATE OF ADMISSION:  02/06/2019  PRIMARY CARE PHYSICIAN: Glean Hess, MD   REQUESTING/REFERRING PHYSICIAN: Dr. Arta Silence  CHIEF COMPLAINT:   Chief Complaint  Patient presents with  . Chest Pain    HISTORY OF PRESENT ILLNESS:  Aaron [name]  is a 63 y.o. male with a known history of CAD status post prior stents, hypertension, peripheral arterial disease status post lower extremity stent this year who is active at baseline presents to hospital secondary to sudden onset of chest pain since last night. Patient had his first stent about 25 years ago in [place], his latest stent was 3 years ago in his RCA.  Also has PAD and stents put in after angiogram in March 2020.  He has been doing well up until last night.  He quit smoking 4 days ago.  Since then he has been having some dry cough.  No sick contacts or no travel history.  Denies feeling sick, nausea vomiting or diarrhea.  No shortness of breath.  He went to bed last night, woke up around midnight secondary to heaviness in his chest radiating down his left arm.  He took 2 nitroglycerin sublingual tablets which eased off the pain but still had the heaviness and went back to bed.  This morning he woke up and he walked his dog and had persistent chest heaviness associated with shortness of breath.  Denies any diaphoresis or nausea associated with it.  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 EKG does not show any acute ST changes and his high-sensitivity troponin is negative here.  But given his significant cardiac history and typical presentation of symptoms, he is being admitted under observation.  PAST MEDICAL HISTORY:   Past Medical History:  Diagnosis Date  . Coronary artery disease    s/p PCI  . Fracture of  left ankle, closed, initial encounter 10/23/2017  . Gout   . Hx of heart artery stent 01/12/2017  . Hypercholesteremia   . Hypertension   . PAD (peripheral artery disease) ([place])   . Pain in limb 07/13/2016  . Unstable angina ([place]) 01/12/2017  . Vertigo     PAST SURGICAL HISTORY:   Past Surgical History:  Procedure Laterality Date  . APPENDECTOMY    . cardiac stents   2012  . COLONOSCOPY  03/30/2013   [place]  . LOWER EXTREMITY ANGIOGRAPHY Right 09/25/2018   Procedure: LOWER EXTREMITY ANGIOGRAPHY;  Surgeon: Algernon Huxley, MD;  Location: [facility];  Service: Cardiovascular;  Laterality: Right;    SOCIAL HISTORY:   Social History   Tobacco Use  . Smoking status: Former Smoker    Packs/day: 0.50    Types: Cigars    Start date: 09/03/1976  . Smokeless tobacco: Never Used  . Tobacco comment: quit 4 days ago  Substance Use Topics  . Alcohol use: Not Currently    Alcohol/week: 1.0 standard drinks    Types: 1 Cans of beer per week    FAMILY HISTORY:   Family History  Problem Relation Age of Onset  . Hypertension Mother   . Diabetes Mother   . Heart failure Father        age 70  . Cancer Father   . Ovarian cancer Sister   .  Heart disease Brother     DRUG ALLERGIES:  No Known Allergies  REVIEW OF SYSTEMS:   Review of Systems  Constitutional: Negative for chills, fever, malaise/fatigue and weight loss.  HENT: Negative for ear discharge, ear pain, hearing loss, nosebleeds and tinnitus.   Eyes: Negative for blurred vision, double vision and photophobia.  Respiratory: Positive for cough. Negative for hemoptysis, shortness of breath and wheezing.   Cardiovascular: Positive for chest pain. Negative for palpitations, orthopnea and leg swelling.  Gastrointestinal: Negative for abdominal pain, constipation, diarrhea, heartburn, melena, nausea and vomiting.  Genitourinary: Negative for dysuria, frequency, hematuria and urgency.  Musculoskeletal: Negative for back  pain, myalgias and neck pain.  Skin: Negative for rash.  Neurological: Negative for dizziness, tingling, tremors, sensory change, speech change, focal weakness and headaches.  Endo/Heme/Allergies: Does not bruise/bleed easily.  Psychiatric/Behavioral: Negative for depression.    MEDICATIONS AT HOME:   Prior to Admission medications   Medication Sig Start Date End Date Taking? Authorizing Provider  aspirin 325 MG EC tablet Take 1 tablet by mouth daily.   Yes [provider]  cilostazol (PLETAL) 50 MG tablet Take 100 tablets by mouth daily.    Yes [provider]  clopidogrel (PLAVIX) 75 MG tablet Take 1 tablet by mouth daily.   Yes [provider]  indomethacin (INDOCIN) 25 MG capsule Take 1 capsule (25 mg total) by mouth 2 (two) times daily as needed. 01/12/19  Yes Glean Hess, MD  isosorbide mononitrate (IMDUR) 60 MG 24 hr tablet Take 90 mg by mouth daily.  09/04/16  Yes [provider]  lisinopril (PRINIVIL,ZESTRIL) 10 MG tablet Take 1 tablet by mouth daily.   Yes [provider]  metoprolol tartrate (LOPRESSOR) 50 MG tablet Take 50-75 mg by mouth 2 (two) times a day. Take 50MG  by mouth every morning and 75MG  every evening   Yes [provider]  nitroGLYCERIN (NITROSTAT) 0.4 MG SL tablet Place 1 tablet under the tongue as needed.   Yes [provider]  simvastatin (ZOCOR) 40 MG tablet Take 1 tablet (40 mg total) by mouth at bedtime. 05/21/15  Yes Glean Hess, MD      VITAL SIGNS:  Blood pressure 140/70, pulse (!) 55, temperature 98.6 F (37 C), temperature source Oral, resp. rate 14, height 6\' 1"  (1.854 m), weight 86.2 kg, SpO2 99 %.  PHYSICAL EXAMINATION:  Physical Exam  GENERAL:  63 y.o.-year-old patient lying in the bed with no acute distress.  EYES: Pupils equal, round, reactive to light and accommodation. No scleral icterus. Extraocular muscles intact.  HEENT: Head atraumatic, normocephalic. Oropharynx and  nasopharynx clear.  NECK:  Supple, no jugular venous distention. No thyroid enlargement, no tenderness.  LUNGS: Normal breath sounds bilaterally, no wheezing, rales,rhonchi or crepitation. No use of accessory muscles of respiration.  CARDIOVASCULAR: S1, S2 normal. No murmurs, rubs, or gallops.  No chest wall tenderness ABDOMEN: Soft, nontender, nondistended. Bowel sounds present. No organomegaly or mass.  EXTREMITIES: No pedal edema, cyanosis, or clubbing.  NEUROLOGIC: Cranial nerves II through XII are intact. Muscle strength 5/5 in all extremities. Sensation intact. Gait not checked.  PSYCHIATRIC: The patient is alert and oriented x 3.  SKIN: No obvious rash, lesion, or ulcer.   LABORATORY PANEL:   CBC Recent Labs  Lab 02/06/19 0935  WBC 8.3  HGB 16.6  HCT 49.3  PLT 183   ------------------------------------------------------------------------------------------------------------------  Chemistries  Recent Labs  Lab 02/06/19 0935  NA 133*  K 4.4  CL 100  CO2 24  GLUCOSE 124*  BUN 20  CREATININE 0.97  CALCIUM 9.3   ------------------------------------------------------------------------------------------------------------------  Cardiac Enzymes No results for input(s): TROPONINI in the last 168 hours. ------------------------------------------------------------------------------------------------------------------  RADIOLOGY:  Dg Chest 2 View  Result Date: 02/06/2019 CLINICAL DATA:  Chest pain EXAM: CHEST - 2 VIEW COMPARISON:  09/15/2018 FINDINGS: Normal heart size and mediastinal contours. No acute infiltrate or edema. Chronic blunting of the lateral right costophrenic sulcus. Minimal scarring seen on the lateral view. No effusion or pneumothorax. Thoracic spondylosis. No acute osseous findings. IMPRESSION: No acute finding. Electronically Signed   By: Monte Fantasia M.D.   On: 02/06/2019 10:16      IMPRESSION AND PLAN:   Aaron [name]  is a 64 y.o. male with a  known history of CAD status post prior stents, hypertension, peripheral arterial disease status post lower extremity stent this year who is active at baseline presents to hospital secondary to sudden onset of chest pain since last night.  1.  Chest pain-likely unstable angina -Admit to telemetry under observation.  Consult [facility] cardiology. -Recycle troponins.  First 2 sets of high-sensitivity troponins are negative -No significant EKG changes.  Continue cardiac medications.  Check lipid panel -N.p.o. for stress test in a.m. -Patient already on Imdur  2.  CAD-plan as mentioned above.  Patient on aspirin, Plavix, Imdur, lisinopril, metoprolol and statin.  3.  PAD-status post recent angiogram and stents put in March 2020.  Follows with vascular as outpatient.  On Plavix, Pletal  4.  Hypertension-stable.  Patient on metoprolol, lisinopril and Imdur  5.  DVT prophylaxis-Lovenox  Patient is independent at baseline   All the records are reviewed and case discussed with ED provider. Management plans discussed with the patient, family and they are in agreement.  CODE STATUS: Full Code  TOTAL TIME TAKING CARE OF THIS PATIENT: 52 minutes.    Gladstone Lighter M.D on 02/06/2019 at 2:56 PM  Between 7am to 6pm - Pager - [phone]  After 6pm go to [email or web address] - Proofreader  [facility]  Hospitalists  Office  [phone]  CC: Primary care physician; Glean Hess, MD   Note: This dictation was prepared with Dragon dictation along with smaller phrase technology. Any transcriptional errors that result from this process are unintentional.

## 2019-02-06 NOTE — ED Provider Notes (Signed)
[facility] Emergency Department Provider Note ____________________________________________   First MD Initiated Contact with Patient 02/06/19 [id number]     (approximate)  I have reviewed the triage vital signs and the nursing notes.   HISTORY  Chief Complaint Chest Pain    HPI Aaron [name] is a 63 y.o. male with PMH as noted below including history of CAD status post stenting who presents with chest pain, acute onset last night around midnight, somewhat improved and then worsened this morning after he walked his dog.  He states that the pain has now mostly subsided although he still feels pressure on his chest.  The patient took 162 mg of aspirin.  He took his own nitroglycerin during the night, and then was given nitroglycerin by EMS.  He reports mild shortness of breath and has had a nonproductive cough, but denies fever.  He denies nausea or vomiting.  He has mild lightheadedness but states that this is been going on for some time and he believes it is related to his vertigo.  He has not had similar pain for several years.  Past Medical History:  Diagnosis Date   Coronary artery disease    Fracture of left ankle, closed, initial encounter 10/23/2017   Gout    Hx of heart artery stent 01/12/2017   Hypercholesteremia    Hypertension    Pain in limb 07/13/2016   Unstable angina ([place]) 01/12/2017   Vertigo     Patient Active Problem List   Diagnosis Date Noted   Mixed hyperlipidemia 10/28/2018   Syncope 09/16/2017   Tobacco use disorder 07/13/2016   Elevated blood sugar 07/27/2015   Controlled gout 12/22/2014   CAD in native artery 10/09/2014   Essential (primary) hypertension 10/09/2014   Dupuytren's disease of palm 10/09/2014   Psoriasis 10/09/2014   Pain in shoulder 10/09/2014   H/O cardiac catheterization 01/27/2014   Peripheral vascular disease ([place]) 01/27/2014    Past Surgical History:  Procedure Laterality Date    APPENDECTOMY     cardiac stents   2012   COLONOSCOPY  03/30/2013   [place]   LOWER EXTREMITY ANGIOGRAPHY Right 09/25/2018   Procedure: LOWER EXTREMITY ANGIOGRAPHY;  Surgeon: Algernon Huxley, MD;  Location: [place] [facility];  Service: Cardiovascular;  Laterality: Right;    Prior to Admission medications   Medication Sig Start Date End Date Taking? Authorizing Provider  aspirin 325 MG EC tablet Take 1 tablet by mouth daily.    [provider]  cilostazol (PLETAL) 50 MG tablet Take 1 tablet by mouth 2 (two) times daily.     [provider]  clopidogrel (PLAVIX) 75 MG tablet Take 1 tablet by mouth daily.    [provider]  diazepam (VALIUM) 5 MG tablet Take 1 tablet (5 mg total) by mouth every 8 (eight) hours as needed for muscle spasms. Patient not taking: Reported on 09/25/2018 09/15/18   Earleen Newport, MD  indomethacin (INDOCIN) 25 MG capsule Take 1 capsule (25 mg total) by mouth 2 (two) times daily as needed. 01/12/19   Glean Hess, MD  isosorbide mononitrate (IMDUR) 60 MG 24 hr tablet Take 90 mg by mouth daily.  09/04/16   [provider]  lisinopril (PRINIVIL,ZESTRIL) 10 MG tablet Take 1 tablet by mouth daily.    [provider]  loperamide (IMODIUM A-D) 2 MG tablet Take 1 tablet (2 mg total) by mouth 4 (four) times daily as needed for diarrhea or loose stools. Patient not  taking: Reported on 10/28/2018 09/17/17   Henreitta Leber, MD  meclizine (ANTIVERT) 25 MG tablet Take 1 tablet (25 mg total) by mouth 3 (three) times daily as needed for dizziness. Patient not taking: Reported on 10/28/2018 06/28/17   Glean Hess, MD  METOPROLOL TARTRATE PO Take by mouth. Take 50MG  by mouth every morning and 75MG  every evening    [provider]  nitroGLYCERIN (NITROSTAT) 0.4 MG SL tablet Place 1 tablet under the tongue as needed.    [provider]  simvastatin (ZOCOR) 40 MG tablet Take 1 tablet (40 mg total) by mouth at  bedtime. 05/21/15   Glean Hess, MD  varenicline (CHANTIX CONTINUING MONTH PAK) 1 MG tablet Take 1 tablet (1 mg total) by mouth 2 (two) times daily. Patient not taking: Reported on 10/28/2018 04/02/18   Glean Hess, MD  varenicline (CHANTIX STARTING MONTH PAK) 0.5 MG X 11 & 1 MG X 42 tablet Take one 0.5 mg tablet by mouth once daily for 3 days, then increase to one 0.5 mg tablet twice daily for 4 days, then increase to one 1 mg tablet twice daily. Patient not taking: Reported on 06/09/2018 04/02/18   Glean Hess, MD    Allergies Patient has no known allergies.  Family History  Problem Relation Age of Onset   Hypertension Mother    Diabetes Mother    Heart failure Father        age 46   Cancer Father    Ovarian cancer Sister    Heart disease Brother     Social History Social History   Tobacco Use   Smoking status: Former Smoker    Packs/day: 0.50    Types: Cigars    Start date: 09/03/1976   Smokeless tobacco: Never Used   Tobacco comment: quit 4 days ago  Substance Use Topics   Alcohol use: Not Currently    Alcohol/week: 1.0 standard drinks    Types: 1 Cans of beer per week   Drug use: No    Review of Systems  Constitutional: No fever. Eyes: No redness. ENT: No sore throat. Cardiovascular: Positive for chest pain. Respiratory: Positive for shortness of breath. Gastrointestinal: No vomiting or diarrhea.  Genitourinary: Negative for flank pain.  Musculoskeletal: Negative for back pain. Skin: Negative for rash. Neurological: Negative for headache.   ____________________________________________   PHYSICAL EXAM:  VITAL SIGNS: ED Triage Vitals  Enc Vitals Group     BP 02/06/19 0925 (!) 161/92     Pulse Rate 02/06/19 0925 (!) 57     Resp 02/06/19 0925 14     Temp 02/06/19 0925 98.6 F (37 C)     Temp Source 02/06/19 0925 Oral     SpO2 02/06/19 0919 100 %     Weight 02/06/19 0927 190 lb (86.2 kg)     Height 02/06/19 0927 6\' 1"  (1.854  m)     Head Circumference --      Peak Flow --      Pain Score 02/06/19 0926 7     Pain Loc --      Pain Edu? --      Excl. in [place]? --     Constitutional: Alert and oriented.  Relatively well appearing and in no acute distress. Eyes: Conjunctivae are normal.  Head: Atraumatic. Nose: No congestion/rhinnorhea. Mouth/Throat: Mucous membranes are moist.   Neck: Normal range of motion.  Cardiovascular: Normal rate, regular rhythm. Grossly normal heart sounds.  Good peripheral circulation. Respiratory:  Normal respiratory effort.  No retractions. Lungs CTAB. Gastrointestinal: No distention.  Musculoskeletal: No lower extremity edema.  No calf or popliteal swelling or tenderness.  Extremities warm and well perfused.  Neurologic:  Normal speech and language. No gross focal neurologic deficits are appreciated.  Skin:  Skin is warm and dry. No rash noted. Psychiatric: Mood and affect are normal. Speech and behavior are normal.  ____________________________________________   LABS (all labs ordered are listed, but only abnormal results are displayed)  Labs Reviewed  BASIC METABOLIC PANEL - Abnormal; Notable for the following components:      Result Value   Sodium 133 (*)    Glucose, Bld 124 (*)    All other components within normal limits  SARS CORONAVIRUS 2  CBC  TROPONIN I (HIGH SENSITIVITY)  TROPONIN I (HIGH SENSITIVITY)   ____________________________________________  EKG  ED ECG REPORT I, Arta Silence, the attending physician, personally viewed and interpreted this ECG.  Date: 02/06/2019 EKG Time: 921 Rate: 58 Rhythm: normal sinus rhythm QRS Axis: Borderline right axis Intervals: normal ST/T Wave abnormalities: Nonspecific minimal ST elevation in inferior leads, concave Narrative Interpretation: Nonspecific minimal ST elevation inferiorly, unchanged from EKG of 09/15/2018; no evidence of acute ischemia  ED ECG REPORT I, Arta Silence, the attending physician,  personally viewed and interpreted this ECG.  Date: 02/06/2019 EKG Time: 1140 Rate: 55 Rhythm: normal sinus rhythm QRS Axis: Borderline right axis Intervals: normal ST/T Wave abnormalities: Nonspecific inferior ST abnormality Narrative Interpretation: No dynamic change when compared to EKG of 921 today   ____________________________________________  RADIOLOGY  CXR: No focal infiltrate or other acute abnormality  ____________________________________________   PROCEDURES  Procedure(s) performed: No  Procedures  Critical Care performed: No ____________________________________________   INITIAL IMPRESSION / ASSESSMENT AND PLAN / ED COURSE  Pertinent labs & imaging results that were available during my care of the patient were reviewed by me and considered in my medical decision making (see chart for details).  63 year old male with a history of CAD presents with chest pain since last night which initially started spontaneously but this morning worsened during exertion.  The patient states that the pain is mostly resolved after he got nitroglycerin and aspirin, but does still feel pressure.  He has been somewhat short of breath recently as well.  The patient denies any leg swelling and has no GI symptoms.  I reviewed the past medical records in epic.  The patient follows with Dr. Saralyn Pilar from cardiology.  He was last seen for a maintenance visit in June of this year.  Myoview in 2018 showed no evidence of scarring or ischemia.  On exam, the patient is overall well-appearing.  His vital signs are normal except for mild hypertension.  The remainder of the physical exam is unremarkable.  His EKG shows no ischemic changes and is identical to an EKG from March of this year.  The patient is at increased risk for ACS although my suspicion is overall low on this presentation given that the pain has mostly resolved and he has no EKG findings. The patient has no specific risk factors for  PE and no signs or symptoms of DVT or PE.  He has no clinical findings to suggest aortic dissection or other vascular etiology.  We will obtain basic labs, troponins, chest x-ray, and reassess.    ----------------------------------------- 12:28 PM on 02/06/2019 -----------------------------------------  Initial and repeat troponin are both negative.  The patient did have another episode of chest pain that lasted for a few  minutes while in the ED.  He had no EKG changes during that time.  Although the second troponin is negative, I am concerned for unstable angina given the patient's CAD history.  I consulted Dr. Nehemiah Massed who agreed with admitting the patient for observation overnight given his elevated risk.  I signed the patient out to the hospitalist Dr. Verdell Carmine.  ____________________________  Aaron [name] was evaluated in Emergency Department on 02/06/2019 for the symptoms described in the history of present illness. He was evaluated in the context of the global COVID-19 pandemic, which necessitated consideration that the patient might be at risk for infection with the SARS-CoV-2 virus that causes COVID-19. Institutional protocols and algorithms that pertain to the evaluation of patients at risk for COVID-19 are in a state of rapid change based on information released by regulatory bodies including the CDC and federal and state organizations. These policies and algorithms were followed during the patient's care in the ED.  ____________________________________________   FINAL CLINICAL IMPRESSION(S) / ED DIAGNOSES  Final diagnoses:  Chest pain, unspecified type      NEW MEDICATIONS STARTED DURING THIS VISIT:  New Prescriptions   No medications on file     Note:  This document was prepared using Dragon voice recognition software and may include unintentional dictation errors.    Arta Silence, MD 02/06/19 1230

## 2019-02-06 NOTE — ED Notes (Signed)
ED TO INPATIENT HANDOFF REPORT  ED Nurse Name and Phone #: Anderson Malta, RN [phone]  S Name/Age/Gender Aaron [name] 63 y.o. male Room/Bed: ED10A/ED10A  Code Status   Code Status: Prior  Home/SNF/Other Home Patient oriented to: self, place, time and situation Is this baseline? Yes   Triage Complete: Triage complete  Chief Complaint chest pain ems  Triage Note Pt arrived EMS from home. Pt c/o chest tightness that started last night. Pt woke up this am and said the chest tightness had returned and worsened after walking. Pt took 162mg  of Asprin and has 2 Nitro per EMS. Pt c/o SOB. Denies nausea or other symptoms    Allergies No Known Allergies  Level of Care/Admitting Diagnosis ED Disposition    ED Disposition Condition [facility]: [place] [[id number]]  Level of Care: Telemetry [5]  Covid Evaluation: N/A  Diagnosis: Chest pain [268341]  Admitting Physician: Gladstone Lighter [[id number]]  Attending Physician: Gladstone Lighter [phone]  PT Class (Do Not Modify): Observation [104]  PT Acc Code (Do Not Modify): Observation [10022]       B Medical/Surgery History Past Medical History:  Diagnosis Date  . Coronary artery disease    s/p PCI  . Fracture of left ankle, closed, initial encounter 10/23/2017  . Gout   . Hx of heart artery stent 01/12/2017  . Hypercholesteremia   . Hypertension   . PAD (peripheral artery disease) ([place])   . Pain in limb 07/13/2016  . Unstable angina ([place]) 01/12/2017  . Vertigo    Past Surgical History:  Procedure Laterality Date  . APPENDECTOMY    . cardiac stents   2012  . COLONOSCOPY  03/30/2013   [place]  . LOWER EXTREMITY ANGIOGRAPHY Right 09/25/2018   Procedure: LOWER EXTREMITY ANGIOGRAPHY;  Surgeon: Algernon Huxley, MD;  Location: [place] [facility];  Service: Cardiovascular;  Laterality: Right;     A IV Location/Drains/Wounds Patient Lines/Drains/Airways Status   Active Line/Drains/Airways     Name:   Placement date:   Placement time:   Site:   Days:   Peripheral IV 02/06/19 Right Antecubital   02/06/19    0919    Antecubital   less than 1   Sheath 09/25/18 Left Arterial;Femoral   09/25/18    0840    Arterial;Femoral   134          Intake/Output Last 24 hours No intake or output data in the 24 hours ending 02/06/19 1558  Labs/Imaging Results for orders placed or performed during the hospital encounter of 02/06/19 (from the past 48 hour(s))  Basic metabolic panel     Status: Abnormal   Collection Time: 02/06/19  9:35 AM  Result Value Ref Range   Sodium 133 (L) 135 - 145 mmol/L   Potassium 4.4 3.5 - 5.1 mmol/L   Chloride 100 98 - 111 mmol/L   CO2 24 22 - 32 mmol/L   Glucose, Bld 124 (H) 70 - 99 mg/dL   BUN 20 8 - 23 mg/dL   Creatinine, Ser 0.97 0.61 - 1.24 mg/dL   Calcium 9.3 8.9 - 10.3 mg/dL   GFR calc non Af Amer >60 >60 mL/min   GFR calc Af Amer >60 >60 mL/min   Anion gap 9 5 - 15    Comment: Performed at [facility], [address] [place], [place] [address]  CBC     Status: None   Collection Time: 02/06/19  9:35 AM  Result Value  Ref Range   WBC 8.3 4.0 - 10.5 K/uL   RBC 5.15 4.22 - 5.81 MIL/uL   Hemoglobin 16.6 13.0 - 17.0 g/dL   HCT 49.3 39.0 - 52.0 %   MCV 95.7 80.0 - 100.0 fL   MCH 32.2 26.0 - 34.0 pg   MCHC 33.7 30.0 - 36.0 g/dL   RDW 13.4 11.5 - 15.5 %   Platelets 183 150 - 400 K/uL   nRBC 0.0 0.0 - 0.2 %    Comment: Performed at [facility], [place], [place] [address]  Troponin I (High Sensitivity)     Status: None   Collection Time: 02/06/19  9:35 AM  Result Value Ref Range   Troponin I (High Sensitivity) <2 <18 ng/L    Comment: (NOTE) Elevated high sensitivity troponin I (hsTnI) values and significant  changes across serial measurements may suggest ACS but many other  chronic and acute conditions are known to elevate hsTnI results.  Refer to the "Links" section for chest pain algorithms and additional   guidance. Performed at [facility], [place], [place] [address]   Troponin I (High Sensitivity)     Status: None   Collection Time: 02/06/19 11:47 AM  Result Value Ref Range   Troponin I (High Sensitivity) <2 <18 ng/L    Comment: (NOTE) Elevated high sensitivity troponin I (hsTnI) values and significant  changes across serial measurements may suggest ACS but many other  chronic and acute conditions are known to elevate hsTnI results.  Refer to the "Links" section for chest pain algorithms and additional  guidance. Performed at [facility], [address] [place], [place] [address]   SARS Coronavirus 2 [facility] order, Performed in [facility] hospital lab)     Status: None   Collection Time: 02/06/19  1:22 PM  Result Value Ref Range   SARS Coronavirus 2 NEGATIVE NEGATIVE    Comment: (NOTE) If result is NEGATIVE SARS-CoV-2 target nucleic acids are NOT DETECTED. The SARS-CoV-2 RNA is generally detectable in upper and lower  respiratory specimens during the acute phase of infection. The lowest  concentration of SARS-CoV-2 viral copies this assay can detect is 250  copies / mL. A negative result does not preclude SARS-CoV-2 infection  and should not be used as the sole basis for treatment or other  patient management decisions.  A negative result may occur with  improper specimen collection / handling, submission of specimen other  than nasopharyngeal swab, presence of viral mutation(s) within the  areas targeted by this assay, and inadequate number of viral copies  (<250 copies / mL). A negative result must be combined with clinical  observations, patient history, and epidemiological information. If result is POSITIVE SARS-CoV-2 target nucleic acids are DETECTED. The SARS-CoV-2 RNA is generally detectable in upper and lower  respiratory specimens dur ing the acute phase of infection.  Positive  results are indicative of active infection  with SARS-CoV-2.  Clinical  correlation with patient history and other diagnostic information is  necessary to determine patient infection status.  Positive results do  not rule out bacterial infection or co-infection with other viruses. If result is PRESUMPTIVE POSTIVE SARS-CoV-2 nucleic acids MAY BE PRESENT.   A presumptive positive result was obtained on the submitted specimen  and confirmed on repeat testing.  While 2019 novel coronavirus  (SARS-CoV-2) nucleic acids may be present in the submitted sample  additional confirmatory testing may be necessary for epidemiological  and / or  clinical management purposes  to differentiate between  SARS-CoV-2 and other Sarbecovirus currently known to infect humans.  If clinically indicated additional testing with an alternate test  methodology [phone]) is advised. The SARS-CoV-2 RNA is generally  detectable in upper and lower respiratory sp ecimens during the acute  phase of infection. The expected result is Negative. Fact Sheet for Patients:  StrictlyIdeas.no Fact Sheet for Healthcare Providers: BankingDealers.co.za This test is not yet approved or cleared by the [place] FDA and has been authorized for detection and/or diagnosis of SARS-CoV-2 by FDA under an Emergency Use Authorization (EUA).  This EUA will remain in effect (meaning this test can be used) for the duration of the COVID-19 declaration under Section 564(b)(1) of the Act, 21 U.S.C. section 360bbb-3(b)(1), unless the authorization is terminated or revoked sooner. Performed at [facility], [address] [place], [place] [address]    Dg Chest 2 View  Result Date: 02/06/2019 CLINICAL DATA:  Chest pain EXAM: CHEST - 2 VIEW COMPARISON:  09/15/2018 FINDINGS: Normal heart size and mediastinal contours. No acute infiltrate or edema. Chronic blunting of the lateral right costophrenic sulcus. Minimal scarring seen on the  lateral view. No effusion or pneumothorax. Thoracic spondylosis. No acute osseous findings. IMPRESSION: No acute finding. Electronically Signed   By: Monte Fantasia M.D.   On: 02/06/2019 10:16    Pending Labs [employer] (From admission, onward)    Start     Ordered   Signed and Held  HIV antibody (Routine Testing)  Once,   R     Signed and Held   Signed and Occupational hygienist morning,   R     Signed and Held   Signed and Held  CBC  Tomorrow morning,   R     Signed and Held   Signed and Held  Lipid panel  Add-on,   R     Signed and Held   Signed and Held  TSH  Once,   R     Signed and Held          Vitals/Pain Today's Vitals   02/06/19 1130 02/06/19 1200 02/06/19 1230 02/06/19 1300  BP: 123/76 137/72 (!) 149/71 140/70  Pulse: (!) 55 (!) 52 (!) 53 (!) 55  Resp: 17 12 16 14   Temp:      TempSrc:      SpO2: 99% 99% 100% 99%  Weight:      Height:      PainSc:        Isolation Precautions No active isolations  Medications Medications  sodium chloride flush (NS) 0.9 % injection 3 mL (has no administration in time range)    Mobility walks Low fall risk   Focused Assessments Cardiac Assessment Handoff:  Cardiac Rhythm: Sinus bradycardia Lab Results  Component Value Date   CKTOTAL 55 11/07/2012   CKMB < 0.5 (L) 07/02/2014   TROPONINI <0.03 09/15/2018   No results found for: DDIMER Does the Patient currently have chest pain? No     R Recommendations: See Admitting Provider Note  Report given to:   Additional Notes:

## 2019-02-06 NOTE — Care Management Obs Status (Signed)
MEDICARE OBSERVATION STATUS NOTIFICATION   Patient Details  Name: Aaron [name] MRN: [id number] Date of Birth: [birth year]   Medicare Observation Status Notification Given:  Yes    Marshell Garfinkel, RN 02/06/2019, 1:28 PM

## 2019-02-06 NOTE — Consult Note (Signed)
[facility] Consultation Note  Patient ID: Aaron [name], MRN: [id number], DOB/AGE: 08/27/1955 63 y.o. Admit date: 02/06/2019   Date of Consult: 02/06/2019 Primary Physician: Glean Hess, MD Primary Cardiologist: Par[place]  Chief Complaint:  Chief Complaint  Patient presents with  . Chest Pain   Reason for Consult: Chest pain  HPI: 63 y.o. male with known essential hypertension mixed hyperlipidemia peripheral vascular disease and previous coronary artery disease status post previous stenting.  The patient has done fairly well in the last many months with no evidence of significant anginal symptoms.  The patient has been on appropriate medication management and tolerated well.  Last night the patient woke up with some substernal chest discomfort radiating throughout the under entire chest and up into the left upper chest.  This did not cause any shortness of breath.  He took a nitroglycerin and felt well and went back to bed.  Throughout the morning he had had other chest heaviness and pressure for which she took a nitroglycerin and it did not help tremendously.  Now he still has some mild chest heaviness throughout the entire time but no exacerbation with physical activity.  EKG is normal troponin is normal.  Patient feels well hemodynamically  Past Medical History:  Diagnosis Date  . Coronary artery disease    s/p PCI  . Fracture of left ankle, closed, initial encounter 10/23/2017  . Gout   . Hx of heart artery stent 01/12/2017  . Hypercholesteremia   . Hypertension   . PAD (peripheral artery disease) ([place])   . Pain in limb 07/13/2016  . Unstable angina ([place]) 01/12/2017  . Vertigo       Surgical History:  Past Surgical History:  Procedure Laterality Date  . APPENDECTOMY    . cardiac stents   2012  . COLONOSCOPY  03/30/2013   [place]  . LOWER EXTREMITY ANGIOGRAPHY Right 09/25/2018   Procedure: LOWER EXTREMITY ANGIOGRAPHY;  Surgeon: Algernon Huxley, MD;  Location:  [place];  Service: Cardiovascular;  Laterality: Right;     Home Meds: Prior to Admission medications   Medication Sig Start Date End Date Taking? Authorizing Provider  aspirin 325 MG EC tablet Take 1 tablet by mouth daily.   Yes [provider]  cilostazol (PLETAL) 50 MG tablet Take 100 tablets by mouth daily.    Yes [provider]  clopidogrel (PLAVIX) 75 MG tablet Take 1 tablet by mouth daily.   Yes [provider]  indomethacin (INDOCIN) 25 MG capsule Take 1 capsule (25 mg total) by mouth 2 (two) times daily as needed. 01/12/19  Yes Glean Hess, MD  isosorbide mononitrate (IMDUR) 60 MG 24 hr tablet Take 90 mg by mouth daily.  09/04/16  Yes [provider]  lisinopril (PRINIVIL,ZESTRIL) 10 MG tablet Take 1 tablet by mouth daily.   Yes [provider]  metoprolol tartrate (LOPRESSOR) 50 MG tablet Take 50-75 mg by mouth 2 (two) times a day. Take 50MG  by mouth every morning and 75MG  every evening   Yes [provider]  nitroGLYCERIN (NITROSTAT) 0.4 MG SL tablet Place 1 tablet under the tongue as needed.   Yes [provider]  simvastatin (ZOCOR) 40 MG tablet Take 1 tablet (40 mg total) by mouth at bedtime. 05/21/15  Yes Glean Hess, MD    Inpatient Medications:  . sodium chloride flush  3 mL Intravenous Once     Allergies: No Known Allergies  Social History   Socioeconomic  History  . Marital status: Single    Spouse name: Not on file  . Number of children: 0  . Years of education: Not on file  . Highest education level: 12th grade  Occupational History  . Occupation: Retired/Disabled  Social Needs  . Financial resource strain: Not hard at all  . Food insecurity    Worry: Never true    Inability: Never true  . Transportation needs    Medical: No    Non-medical: No  Tobacco Use  . Smoking status: Former Smoker    Packs/day: 0.50    Types: Cigars    Start date: 09/03/1976  . Smokeless  tobacco: Never Used  . Tobacco comment: quit 4 days ago  Substance and Sexual Activity  . Alcohol use: Not Currently    Alcohol/week: 1.0 standard drinks    Types: 1 Cans of beer per week  . Drug use: No  . Sexual activity: Not Currently    Birth control/protection: None  Lifestyle  . Physical activity    Days per week: 7 days    Minutes per session: 40 min  . Stress: Not at all  Relationships  . Social Herbalist on phone: Patient refused    Gets together: Patient refused    Attends religious service: Patient refused    Active member of club or organization: Patient refused    Attends meetings of clubs or organizations: Patient refused    Relationship status: Patient refused  . Intimate partner violence    Fear of current or ex partner: No    Emotionally abused: No    Physically abused: No    Forced sexual activity: No  Other Topics Concern  . Not on file  Social History Narrative   Independent at baseline     Family History  Problem Relation Age of Onset  . Hypertension Mother   . Diabetes Mother   . Heart failure Father        age 69  . Cancer Father   . Ovarian cancer Sister   . Heart disease Brother      Review of Systems Positive for chest pain Negative for: General:  chills, fever, night sweats or weight changes.  Cardiovascular: PND orthopnea syncope dizziness  Dermatological skin lesions rashes Respiratory: Cough congestion Urologic: Frequent urination urination at night and hematuria Abdominal: negative for nausea, vomiting, diarrhea, bright red blood per rectum, melena, or hematemesis Neurologic: negative for visual changes, and/or hearing changes  All other systems reviewed and are otherwise negative except as noted above.  Labs: No results for input(s): CKTOTAL, CKMB, TROPONINI in the last 72 hours. Lab Results  Component Value Date   WBC 8.3 02/06/2019   HGB 16.6 02/06/2019   HCT 49.3 02/06/2019   MCV 95.7 02/06/2019   PLT 183  02/06/2019    Recent Labs  Lab 02/06/19 0935  NA 133*  K 4.4  CL 100  CO2 24  BUN 20  CREATININE 0.97  CALCIUM 9.3  GLUCOSE 124*   Lab Results  Component Value Date   CHOL 139 10/28/2018   HDL 35 (L) 10/28/2018   LDLCALC 84 10/28/2018   TRIG 100 10/28/2018   No results found for: DDIMER  Radiology/Studies:  Dg Chest 2 View  Result Date: 02/06/2019 CLINICAL DATA:  Chest pain EXAM: CHEST - 2 VIEW COMPARISON:  09/15/2018 FINDINGS: Normal heart size and mediastinal contours. No acute infiltrate or edema. Chronic blunting of the lateral right costophrenic sulcus. Minimal scarring  seen on the lateral view. No effusion or pneumothorax. Thoracic spondylosis. No acute osseous findings. IMPRESSION: No acute finding. Electronically Signed   By: Monte Fantasia M.D.   On: 02/06/2019 10:16    EKG: Normal sinus rhythm.  Normal EKG  Weights: Filed Weights   02/06/19 0927  Weight: 86.2 kg     Physical Exam: Blood pressure 132/63, pulse 65, temperature 98.6 F (37 C), temperature source Oral, resp. rate 20, height 6\' 1"  (1.854 m), weight 86.2 kg, SpO2 93 %. Body mass index is 25.07 kg/m. General: Well developed, well nourished, in no acute distress. Head eyes ears nose throat: Normocephalic, atraumatic, sclera non-icteric, no xanthomas, nares are without discharge. No apparent thyromegaly and/or mass  Lungs: Normal respiratory effort.  no wheezes, no rales, no rhonchi.  Heart: RRR with normal S1 S2. no murmur gallop, no rub, PMI is normal size and placement, carotid upstroke normal without bruit, jugular venous pressure is normal Abdomen: Soft, non-tender, non-distended with normoactive bowel sounds. No hepatomegaly. No rebound/guarding. No obvious abdominal masses. Abdominal aorta is normal size without bruit Extremities: No edema. no cyanosis, no clubbing, no ulcers  Peripheral : 2+ bilateral upper extremity pulses, 2+ bilateral femoral pulses, 2+ bilateral dorsal pedal pulse Neuro:  Alert and oriented. No facial asymmetry. No focal deficit. Moves all extremities spontaneously. Musculoskeletal: Normal muscle tone without kyphosis Psych:  Responds to questions appropriately with a normal affect.    Assessment: 63 year old male with previous history of peripheral vascular disease hypertension hyperlipidemia stenting in the past with atypical chest discomfort without evidence of congestive heart failure or myocardial infarction and currently improved  Plan: 1.  Patient is on appropriate medication management and would continue nitrates including isosorbide for atypical chest discomfort 2.  With normal troponin and no evidence of myocardial infarction or EKG changes okay for ambulating and following for improvements of symptoms.  If patient has no further chest discomfort okay for discharge home from cardiac standpoint with follow-up in 2 days for further adjustments of medications and further intervention if necessary 3.  Continue hypertension control and antiplatelet therapy as well as high intensity cholesterol therapy for further risk reduction  Signed, Corey Skains M.D. [facility] 02/06/2019, 4:29 PM

## 2019-02-06 NOTE — Progress Notes (Signed)
Notify Gardiner Barefoot NP about patient's request to have sleeping aid, order for Trazodone PRN given.

## 2019-02-06 NOTE — Care Management (Signed)
RNCM met with patient to deliver [place] letter. He is independent with mobility in room seeking care for his dog that is home alone.

## 2019-02-06 NOTE — ED Notes (Signed)
Pt called out episode of chest pain that lasted for approx 5 mins. Spontaneously relived. Recaptured EKG. Dr. Cherylann Banas notified, orders received.

## 2019-02-06 NOTE — ED Notes (Signed)
PT ambulatory in room per his request.  Pt taken off monitor to allow for ambulation.  Will monitor closely.

## 2019-02-06 NOTE — Progress Notes (Signed)
Notified Dr. Tressia Miners about patient's complaints of mid sternal chest pressure was 6/10 gave 1 SL nitro now chest pressure is 4/10, asked if patient will benefit for Nitro paste, order given for one time dose. Also ordered for Morphine 2mg  IV for severe pain. RN will continue to monitor.

## 2019-02-06 NOTE — ED Triage Notes (Signed)
Pt arrived EMS from home. Pt c/o chest tightness that started last night. Pt woke up this am and said the chest tightness had returned and worsened after walking. Pt took 162mg  of Asprin and has 2 Nitro per EMS. Pt c/o SOB. Denies nausea or other symptoms

## 2019-02-07 DIAGNOSIS — I200 Unstable angina: Secondary | ICD-10-CM | POA: Diagnosis not present

## 2019-02-07 DIAGNOSIS — I2510 Atherosclerotic heart disease of native coronary artery without angina pectoris: Secondary | ICD-10-CM | POA: Diagnosis not present

## 2019-02-07 DIAGNOSIS — I739 Peripheral vascular disease, unspecified: Secondary | ICD-10-CM | POA: Diagnosis not present

## 2019-02-07 DIAGNOSIS — I10 Essential (primary) hypertension: Secondary | ICD-10-CM | POA: Diagnosis not present

## 2019-02-07 LAB — CBC
HCT: 49.5 % (ref 39.0–52.0)
Hemoglobin: 17 g/dL (ref 13.0–17.0)
MCH: 32.6 pg (ref 26.0–34.0)
MCHC: 34.3 g/dL (ref 30.0–36.0)
MCV: 95 fL (ref 80.0–100.0)
Platelets: 171 10*3/uL (ref 150–400)
RBC: 5.21 MIL/uL (ref 4.22–5.81)
RDW: 13.4 % (ref 11.5–15.5)
WBC: 7 10*3/uL (ref 4.0–10.5)
nRBC: 0 % (ref 0.0–0.2)

## 2019-02-07 LAB — BASIC METABOLIC PANEL
Anion gap: 11 (ref 5–15)
BUN: 20 mg/dL (ref 8–23)
CO2: 21 mmol/L — ABNORMAL LOW (ref 22–32)
Calcium: 9.3 mg/dL (ref 8.9–10.3)
Chloride: 106 mmol/L (ref 98–111)
Creatinine, Ser: 0.84 mg/dL (ref 0.61–1.24)
GFR calc Af Amer: >60 mL/min (ref >60)
GFR calc non Af Amer: >60 mL/min (ref >60)
Glucose, Bld: 110 mg/dL — ABNORMAL HIGH (ref 70–99)
Potassium: 4.1 mmol/L (ref 3.5–5.1)
Sodium: 138 mmol/L (ref 135–145)

## 2019-02-07 MED ORDER — SODIUM CHLORIDE 0.9% FLUSH
3.0000 mL | Freq: Two times a day (BID) | INTRAVENOUS | Status: DC
  Administered 2019-02-07: 3 mL via INTRAVENOUS

## 2019-02-07 NOTE — Progress Notes (Signed)
Discharged to home with his brother.  No change to medications.  He knows to make follow up appointments on Monday.

## 2019-02-07 NOTE — Discharge Summary (Signed)
[name]    MR#:  [id number]  DATE OF BIRTH:  [birth year]  DATE OF ADMISSION:  02/06/2019 ADMITTING PHYSICIAN: Gladstone Lighter, MD  DATE OF DISCHARGE:  02/07/19   PRIMARY CARE PHYSICIAN: Glean Hess, MD    ADMISSION DIAGNOSIS:  Chest pain, unspecified type [R07.9]  DISCHARGE DIAGNOSIS:  Chest pain noncardiac  SECONDARY DIAGNOSIS:   Past Medical History:  Diagnosis Date  . Coronary artery disease    s/p PCI  . Fracture of left ankle, closed, initial encounter 10/23/2017  . Gout   . Hx of heart artery stent 01/12/2017  . Hypercholesteremia   . Hypertension   . PAD (peripheral artery disease) ([place])   . Pain in limb 07/13/2016  . Unstable angina ([place]) 01/12/2017  . Vertigo     HOSPITAL COURSE:   HPI Aaron [name]  is a 63 y.o. male with a known history of CAD status post prior stents, hypertension, peripheral arterial disease status post lower extremity stent this year who is active at baseline presents to hospital secondary to sudden onset of chest pain since last night. Patient had his first stent about 25 years ago in [place], his latest stent was 3 years ago in his RCA.  Also has PAD and stents put in after angiogram in March 2020.  He has been doing well up until last night.  He quit smoking 4 days ago.  Since then he has been having some dry cough.  No sick contacts or no travel history.  Denies feeling sick, nausea vomiting or diarrhea.  No shortness of breath.  He went to bed last night, woke up around midnight secondary to heaviness in his chest radiating down his left arm.  He took 2 nitroglycerin sublingual tablets which eased off the pain but still had the heaviness and went back to bed.  This morning he woke up and he walked his dog and had persistent chest heaviness associated with shortness of breath.  Denies any diaphoresis or nausea associated with it.  He went to the fire  department because of his symptoms and they have recommended that he come to the emergency room. EKG does not show any acute ST changes and his high-sensitivity troponin is negative here.  But given his significant cardiac history and typical presentation of symptoms, he is being admitted under observation.   Atypical chest pain -Patient monitored on telemetry.  Non-trending troponins --No significant EKG changes.   LDL 62 -Seen by cardiology no further cardiac interventions needed.  Okay to discharge patient from cardiology standpoint.  Patient ambulated in the hallway without any difficulty denies any chest pain or shortness of breath during my examination.  Stress test which was ordered was canceled by cardiology Outpatient follow-up with Dr. Saralyn Pilar in a week -Patient already on Imdur  2.  CAD-plan as mentioned above.  Patient on aspirin, Plavix, Imdur, lisinopril, metoprolol and statin.  3.  PAD-status post recent angiogram and stents put in March 2020.  Follows with vascular as outpatient.  On Plavix, Pletal  4.  Hypertension-stable.  Patient on metoprolol, lisinopril and Imdur  5.  DVT prophylaxis-Lovenox  Patient is independent at baseline  DISCHARGE CONDITIONS:   Stable   CONSULTS OBTAINED:     PROCEDURES  None   DRUG ALLERGIES:  No Known Allergies  DISCHARGE MEDICATIONS:   Allergies as of 02/07/2019   No Known Allergies     Medication List  TAKE these medications   aspirin 325 MG EC tablet Take 1 tablet by mouth daily.   cilostazol 50 MG tablet Commonly known as: PLETAL Take 100 tablets by mouth daily.   clopidogrel 75 MG tablet Commonly known as: PLAVIX Take 1 tablet by mouth daily.   indomethacin 25 MG capsule Commonly known as: INDOCIN Take 1 capsule (25 mg total) by mouth 2 (two) times daily as needed.   isosorbide mononitrate 60 MG 24 hr tablet Commonly known as: IMDUR Take 90 mg by mouth daily.   lisinopril 10 MG tablet Commonly  known as: ZESTRIL Take 1 tablet by mouth daily.   metoprolol tartrate 50 MG tablet Commonly known as: LOPRESSOR Take 50-75 mg by mouth 2 (two) times a day. Take 50MG  by mouth every morning and 75MG  every evening   nitroGLYCERIN 0.4 MG SL tablet Commonly known as: NITROSTAT Place 1 tablet under the tongue as needed.   simvastatin 40 MG tablet Commonly known as: ZOCOR Take 1 tablet (40 mg total) by mouth at bedtime.        DISCHARGE INSTRUCTIONS:   Follow-up with primary care physician in 2 to 3 days Follow-up with cardiology Dr. Saralyn Pilar in a week  DIET:  Cardiac diet  DISCHARGE CONDITION:  Stable  ACTIVITY:  Activity as tolerated  OXYGEN:  Home Oxygen: No.   Oxygen Delivery: room air  DISCHARGE LOCATION:  home   If you experience worsening of your admission symptoms, develop shortness of breath, life threatening emergency, suicidal or homicidal thoughts you must seek medical attention immediately by calling 911 or calling your MD immediately  if symptoms less severe.  You Must read complete instructions/literature along with all the possible adverse reactions/side effects for all the Medicines you take and that have been prescribed to you. Take any new Medicines after you have completely understood and accpet all the possible adverse reactions/side effects.   Please note  You were cared for by a hospitalist during your hospital stay. If you have any questions about your discharge medications or the care you received while you were in the hospital after you are discharged, you can call the unit and asked to speak with the hospitalist on call if the hospitalist that took care of you is not available. Once you are discharged, your primary care physician will handle any further medical issues. Please note that NO REFILLS for any discharge medications will be authorized once you are discharged, as it is imperative that you return to your primary care physician (or establish  a relationship with a primary care physician if you do not have one) for your aftercare needs so that they can reassess your need for medications and monitor your lab values.     Today  Chief Complaint  Patient presents with  . Chest Pain   Patient is doing fine.  Denies any chest pain or shortness of breath.  Ambulated in the hallway without any difficulty.  Okay to discharge patient from cardiology standpoint  ROS:  CONSTITUTIONAL: Denies fevers, chills. Denies any fatigue, weakness.  EYES: Denies blurry vision, double vision, eye pain. EARS, NOSE, THROAT: Denies tinnitus, ear pain, hearing loss. RESPIRATORY: Denies cough, wheeze, shortness of breath.  CARDIOVASCULAR: Denies chest pain, palpitations, edema.  GASTROINTESTINAL: Denies nausea, vomiting, diarrhea, abdominal pain. Denies bright red blood per rectum. GENITOURINARY: Denies dysuria, hematuria. ENDOCRINE: Denies nocturia or thyroid problems. HEMATOLOGIC AND LYMPHATIC: Denies easy bruising or bleeding. SKIN: Denies rash or lesion. MUSCULOSKELETAL: Denies pain in neck, back, shoulder, knees,  hips or arthritic symptoms.  NEUROLOGIC: Denies paralysis, paresthesias.  PSYCHIATRIC: Denies anxiety or depressive symptoms.   VITAL SIGNS:  Blood pressure (!) 116/59, pulse (!) 56, temperature (!) 97.4 F (36.3 C), temperature source Oral, resp. rate 19, height 6\' 1"  (1.854 m), weight 84 kg, SpO2 99 %.  I/O:    Intake/Output Summary (Last 24 hours) at 02/07/2019 1410 Last data filed at 02/07/2019 0935 Gross per 24 hour  Intake -  Output 1100 ml  Net -1100 ml    PHYSICAL EXAMINATION:  GENERAL:  63 y.o.-year-old patient lying in the bed with no acute distress.  EYES: Pupils equal, round, reactive to light and accommodation. No scleral icterus. Extraocular muscles intact.  HEENT: Head atraumatic, normocephalic. Oropharynx and nasopharynx clear.  NECK:  Supple, no jugular venous distention. No thyroid enlargement, no tenderness.   LUNGS: Normal breath sounds bilaterally, no wheezing, rales,rhonchi or crepitation. No use of accessory muscles of respiration.  CARDIOVASCULAR: S1, S2 normal. No murmurs, rubs, or gallops.  ABDOMEN: Soft, non-tender, non-distended. Bowel sounds present.  EXTREMITIES: No pedal edema, cyanosis, or clubbing.  NEUROLOGIC: Cranial nerves II through XII are intact. Muscle strength 5/5 in all extremities. Sensation intact. Gait not checked.  PSYCHIATRIC: The patient is alert and oriented x 3.  SKIN: No obvious rash, lesion, or ulcer.   DATA REVIEW:   CBC Recent Labs  Lab 02/07/19 0536  WBC 7.0  HGB 17.0  HCT 49.5  PLT 171    Chemistries  Recent Labs  Lab 02/07/19 0536  NA 138  K 4.1  CL 106  CO2 21*  GLUCOSE 110*  BUN 20  CREATININE 0.84  CALCIUM 9.3    Cardiac Enzymes No results for input(s): TROPONINI in the last 168 hours.  Microbiology Results  Results for orders placed or performed during the hospital encounter of 02/06/19  SARS Coronavirus 2 [facility] order, Performed in [facility] hospital lab)     Status: None   Collection Time: 02/06/19  1:22 PM  Result Value Ref Range Status   SARS Coronavirus 2 NEGATIVE NEGATIVE Final    Comment: (NOTE) If result is NEGATIVE SARS-CoV-2 target nucleic acids are NOT DETECTED. The SARS-CoV-2 RNA is generally detectable in upper and lower  respiratory specimens during the acute phase of infection. The lowest  concentration of SARS-CoV-2 viral copies this assay can detect is 250  copies / mL. A negative result does not preclude SARS-CoV-2 infection  and should not be used as the sole basis for treatment or other  patient management decisions.  A negative result may occur with  improper specimen collection / handling, submission of specimen other  than nasopharyngeal swab, presence of viral mutation(s) within the  areas targeted by this assay, and inadequate number of viral copies  (<250 copies / mL). A negative result must be  combined with clinical  observations, patient history, and epidemiological information. If result is POSITIVE SARS-CoV-2 target nucleic acids are DETECTED. The SARS-CoV-2 RNA is generally detectable in upper and lower  respiratory specimens dur ing the acute phase of infection.  Positive  results are indicative of active infection with SARS-CoV-2.  Clinical  correlation with patient history and other diagnostic information is  necessary to determine patient infection status.  Positive results do  not rule out bacterial infection or co-infection with other viruses. If result is PRESUMPTIVE POSTIVE SARS-CoV-2 nucleic acids MAY BE PRESENT.   A presumptive positive result was obtained on the submitted specimen  and confirmed on repeat testing.  While 2019 novel coronavirus  (SARS-CoV-2) nucleic acids may be present in the submitted sample  additional confirmatory testing may be necessary for epidemiological  and / or clinical management purposes  to differentiate between  SARS-CoV-2 and other Sarbecovirus currently known to infect humans.  If clinically indicated additional testing with an alternate test  methodology [phone]) is advised. The SARS-CoV-2 RNA is generally  detectable in upper and lower respiratory sp ecimens during the acute  phase of infection. The expected result is Negative. Fact Sheet for Patients:  StrictlyIdeas.no Fact Sheet for Healthcare Providers: BankingDealers.co.za This test is not yet approved or cleared by the [place] FDA and has been authorized for detection and/or diagnosis of SARS-CoV-2 by FDA under an Emergency Use Authorization (EUA).  This EUA will remain in effect (meaning this test can be used) for the duration of the COVID-19 declaration under Section 564(b)(1) of the Act, 21 U.S.C. section 360bbb-3(b)(1), unless the authorization is terminated or revoked sooner. Performed at [facility], [address] [place], [place] [address]     RADIOLOGY:  Dg Chest 2 View  Result Date: 02/06/2019 CLINICAL DATA:  Chest pain EXAM: CHEST - 2 VIEW COMPARISON:  09/15/2018 FINDINGS: Normal heart size and mediastinal contours. No acute infiltrate or edema. Chronic blunting of the lateral right costophrenic sulcus. Minimal scarring seen on the lateral view. No effusion or pneumothorax. Thoracic spondylosis. No acute osseous findings. IMPRESSION: No acute finding. Electronically Signed   By: Monte Fantasia M.D.   On: 02/06/2019 10:16    EKG:   Orders placed or performed during the hospital encounter of 02/06/19  . EKG 12-Lead  . EKG 12-Lead  . ED EKG  . ED EKG  . EKG 12-Lead  . EKG 12-Lead      Management plans discussed with the patient, he is  in agreement.  CODE STATUS:     Code Status Orders  (From admission, onward)         Start     Ordered   02/06/19 1643  Full code  Continuous     02/06/19 1643        Code Status History    Date Active Date Inactive Code Status Order ID Comments User Context   09/16/2017 1144 09/17/2017 1734 Full Code [id number]  Henreitta Leber, MD Inpatient   01/12/2017 0411 01/12/2017 1622 Full Code [id number]  Saundra Shelling, MD Inpatient   Advance Care Planning Activity      TOTAL TIME TAKING CARE OF THIS PATIENT: 43  minutes.   Note: This dictation was prepared with Dragon dictation along with smaller phrase technology. Any transcriptional errors that result from this process are unintentional.   @[facility] @  on 02/07/2019 at 2:10 PM  Between 7am to 6pm - Pager - [phone]  After 6pm go to [email or web address] - password [email or web address] [facility]  [facility]  Office  [phone]  CC: Primary care physician; Glean Hess, MD

## 2019-02-07 NOTE — Progress Notes (Signed)
[facility] Encounter Note  Patient: Aaron [name] / Admit Date: 02/06/2019 / Date of Encounter: 02/07/2019, 6:57 AM   Subjective: Patient still having slight left upper chest discomfort which has been constant for the last 48 hours.  Deep breaths are exacerbating it in the middle of night.  Patient has had no evidence of shortness of breath.  Palpation slightly causes some discomfort as well.  Troponin levels normal without evidence of myocardial infarction or concerns for ischemia.  EKG normal.  Patient continues to be hemodynamically stable  Review of Systems: Positive for: Left upper chest pain Negative for: Vision change, hearing change, syncope, dizziness, nausea, vomiting,diarrhea, bloody stool, stomach pain, cough, congestion, diaphoresis, urinary frequency, urinary pain,skin lesions, skin rashes Others previously listed  Objective: Telemetry: Normal sinus rhythm Physical Exam: Blood pressure 100/67, pulse (!) 59, temperature 97.7 F (36.5 C), temperature source Oral, resp. rate 16, height 6\' 1"  (1.854 m), weight 84 kg, SpO2 99 %. Body mass index is 24.42 kg/m. General: Well developed, well nourished, in no acute distress. Head: Normocephalic, atraumatic, sclera non-icteric, no xanthomas, nares are without discharge. Neck: No apparent masses Lungs: Normal respirations with no wheezes, no rhonchi, no rales , no crackles   Heart: Regular rate and rhythm, normal S1 S2, no murmur, no rub, no gallop, PMI is normal size and placement, carotid upstroke normal without bruit, jugular venous pressure normal Abdomen: Soft, non-tender, non-distended with normoactive bowel sounds. No hepatosplenomegaly. Abdominal aorta is normal size without bruit Extremities: No edema, no clubbing, no cyanosis, no ulcers,  Peripheral: 2+ radial, 2+ femoral, 2+ dorsal pedal pulses Neuro: Alert and oriented. Moves all extremities spontaneously. Psych:  Responds to questions appropriately  with a normal affect.   Intake/Output Summary (Last 24 hours) at 02/07/2019 0657 Last data filed at 02/07/2019 0443 Gross per 24 hour  Intake -  Output 1100 ml  Net -1100 ml    Inpatient Medications:  . aspirin  325 mg Oral Daily  . cilostazol  50 mg Oral BID  . clopidogrel  75 mg Oral Daily  . enoxaparin (LOVENOX) injection  40 mg Subcutaneous Q24H  . isosorbide mononitrate  90 mg Oral Daily  . lisinopril  10 mg Oral Daily  . metoprolol tartrate  50 mg Oral BID  . simvastatin  40 mg Oral QHS   Infusions:   Labs: Recent Labs    02/06/19 0935 02/07/19 0536  NA 133* 138  K 4.4 4.1  CL 100 106  CO2 24 21*  GLUCOSE 124* 110*  BUN 20 20  CREATININE 0.97 0.84  CALCIUM 9.3 9.3   No results for input(s): AST, ALT, ALKPHOS, BILITOT, PROT, ALBUMIN in the last 72 hours. Recent Labs    02/06/19 0935 02/07/19 0536  WBC 8.3 7.0  HGB 16.6 17.0  HCT 49.3 49.5  MCV 95.7 95.0  PLT 183 171   No results for input(s): CKTOTAL, CKMB, TROPONINI in the last 72 hours. Invalid input(s): POCBNP No results for input(s): HGBA1C in the last 72 hours.   Weights: Filed Weights   02/06/19 0927 02/06/19 1640 02/07/19 0432  Weight: 86.2 kg 85.1 kg 84 kg     Radiology/Studies:  Dg Chest 2 View  Result Date: 02/06/2019 CLINICAL DATA:  Chest pain EXAM: CHEST - 2 VIEW COMPARISON:  09/15/2018 FINDINGS: Normal heart size and mediastinal contours. No acute infiltrate or edema. Chronic blunting of the lateral right costophrenic sulcus. Minimal scarring seen on the lateral view. No effusion or pneumothorax. Thoracic  spondylosis. No acute osseous findings. IMPRESSION: No acute finding. Electronically Signed   By: Monte Fantasia M.D.   On: 02/06/2019 10:16     Assessment and Recommendation  63 y.o. male with known coronary artery disease status post remote stenting hypertension hyperlipidemia on appropriate medication management with atypical left upper chest discomfort without evidence of  myocardial infarction or EKG changes now slowly improving 1.  Begin ambulation and follow for symptoms and need for further other diagnostic testing 2.  Continue antihypertensive medication management including metoprolol lisinopril 3.  Continue isosorbide for chest discomfort will 4 which is similar to his previous chest discomfort atypical in nature and which appears not to be cardiac in nature 4.  High intensity cholesterol therapy 5.  No further cardiac diagnostics necessary at this time.  If patient ambulating well okay for discharge to home from cardiac standpoint with follow-up early next week with Dr. Saralyn Pilar for other adjustments of medication management  Signed, Serafina Royals M.D. FACC

## 2019-02-07 NOTE — Discharge Instructions (Signed)
Follow-up with primary care physician in 2 to 3 days Follow-up with cardiology Dr. Saralyn Pilar in a week

## 2019-02-09 LAB — HIV ANTIBODY (ROUTINE TESTING W REFLEX): HIV Screen 4th Generation wRfx: NONREACTIVE

## 2019-02-11 DIAGNOSIS — I739 Peripheral vascular disease, unspecified: Secondary | ICD-10-CM | POA: Diagnosis not present

## 2019-02-11 DIAGNOSIS — I2510 Atherosclerotic heart disease of native coronary artery without angina pectoris: Secondary | ICD-10-CM | POA: Diagnosis not present

## 2019-02-11 DIAGNOSIS — Z9889 Other specified postprocedural states: Secondary | ICD-10-CM | POA: Diagnosis not present

## 2019-02-11 DIAGNOSIS — F17200 Nicotine dependence, unspecified, uncomplicated: Secondary | ICD-10-CM | POA: Diagnosis not present

## 2019-02-11 DIAGNOSIS — R079 Chest pain, unspecified: Secondary | ICD-10-CM | POA: Diagnosis not present

## 2019-02-11 DIAGNOSIS — R0602 Shortness of breath: Secondary | ICD-10-CM | POA: Diagnosis not present

## 2019-02-11 DIAGNOSIS — E785 Hyperlipidemia, unspecified: Secondary | ICD-10-CM | POA: Diagnosis not present

## 2019-02-11 DIAGNOSIS — I10 Essential (primary) hypertension: Secondary | ICD-10-CM | POA: Diagnosis not present

## 2019-02-16 ENCOUNTER — Ambulatory Visit: Payer: PPO

## 2019-02-16 DIAGNOSIS — J42 Unspecified chronic bronchitis: Secondary | ICD-10-CM | POA: Diagnosis not present

## 2019-02-18 DIAGNOSIS — R079 Chest pain, unspecified: Secondary | ICD-10-CM | POA: Diagnosis not present

## 2019-02-18 DIAGNOSIS — I2510 Atherosclerotic heart disease of native coronary artery without angina pectoris: Secondary | ICD-10-CM | POA: Diagnosis not present

## 2019-02-18 DIAGNOSIS — J42 Unspecified chronic bronchitis: Secondary | ICD-10-CM | POA: Diagnosis not present

## 2019-02-20 ENCOUNTER — Other Ambulatory Visit: Payer: Self-pay

## 2019-02-20 ENCOUNTER — Encounter: Payer: Self-pay | Admitting: Internal Medicine

## 2019-02-20 ENCOUNTER — Ambulatory Visit (INDEPENDENT_AMBULATORY_CARE_PROVIDER_SITE_OTHER): Payer: PPO | Admitting: Internal Medicine

## 2019-02-20 VITALS — BP 118/78 | HR 95 | Ht 73.0 in | Wt 192.0 lb

## 2019-02-20 DIAGNOSIS — F17200 Nicotine dependence, unspecified, uncomplicated: Secondary | ICD-10-CM | POA: Diagnosis not present

## 2019-02-20 DIAGNOSIS — R079 Chest pain, unspecified: Secondary | ICD-10-CM | POA: Diagnosis not present

## 2019-02-20 DIAGNOSIS — J410 Simple chronic bronchitis: Secondary | ICD-10-CM

## 2019-02-20 MED ORDER — ALBUTEROL SULFATE HFA 108 (90 BASE) MCG/ACT IN AERS: 2.0000 | INHALATION_SPRAY | Freq: Four times a day (QID) | RESPIRATORY_TRACT | 5 refills | Status: AC | PRN

## 2019-02-20 NOTE — Progress Notes (Signed)
Date:  02/20/2019   Name:  CECILIA NISHIKAWA   DOB:  Nov 17, 1955   MRN:  [id number]   Chief Complaint: Shortness of Breath (Seen ER for chest pain. Stress Test done by cariologist last wednesday for stress test. Will see him next week to follow up. Pulmonologist gave nebulizer to treat him for 2 months- will follow up after 2 months. )  Chest Pain  This is a new problem. The problem has been resolved. The pain is present in the substernal region. The pain is mild. Associated symptoms include a cough and shortness of breath. Pertinent negatives include no dizziness, fever, headaches, numbness or palpitations. Past medical history comments: diagnosed with chronic bronchitis Prior diagnostic workup includes stress echo (Myoview normal).  Shortness of Breath This is a new problem. The current episode started more than 1 month ago. The problem occurs daily. The problem has been unchanged. Associated symptoms include chest pain. Pertinent negatives include no fever, headaches or leg swelling. The symptoms are aggravated by any activity. He has tried beta agonist inhalers and ipratropium inhalers for the symptoms. (Diagnosed with chronic bronchitis)  Nicotine Dependence Presents for follow-up visit. Symptoms are negative for fatigue. His urge triggers include company of smokers. The symptoms have been stable (still smoking but planning to start Chantix next week). (Diagnosed with chronic bronchitis)   [facility] note from earlier this week: 1. Continue current medication 2. Counseled patient about low-sodium diet 3. DASH diet printed instructions given to the patient 4. Counseled patient about low-cholesterol diet 5. Continue simvastatin for hyperlipidemia management 6. Low-fat and cholesterol diet printed instructions given to the patient 7. Strongly advised patient to stop smoking 9. Referral to pulmonology for COPD management 10. Lexiscan Myoview -  Result Impression   1. Normal left  ventricular function 2. Normal wall motion 3. No evidence for scar or ischemia    11. Return to clinic after [place] [facility]  Constitutional: Negative for chills, fatigue and fever.  HENT: Negative for trouble swallowing.   Respiratory: Positive for cough and shortness of breath. Negative for chest tightness.   Cardiovascular: Positive for chest pain. Negative for palpitations and leg swelling.  Neurological: Negative for dizziness, light-headedness, numbness and headaches.  Psychiatric/Behavioral: Negative for dysphoric mood and sleep disturbance. The patient is not nervous/anxious.     Patient Active Problem List   Diagnosis Date Noted  . Chest pain 02/06/2019  . Mixed hyperlipidemia 10/28/2018  . Syncope 09/16/2017  . Tobacco use disorder 07/13/2016  . Elevated blood sugar 07/27/2015  . Controlled gout 12/22/2014  . CAD in native artery 10/09/2014  . Essential (primary) hypertension 10/09/2014  . Dupuytren's disease of palm 10/09/2014  . Psoriasis 10/09/2014  . Pain in shoulder 10/09/2014  . H/O cardiac catheterization 01/27/2014  . Peripheral vascular disease ([place]) 01/27/2014    No Known Allergies  Past Surgical History:  Procedure Laterality Date  . APPENDECTOMY    . cardiac stents   2012  . COLONOSCOPY  03/30/2013   [place]  . LOWER EXTREMITY ANGIOGRAPHY Right 09/25/2018   Procedure: LOWER EXTREMITY ANGIOGRAPHY;  Surgeon: Algernon Huxley, MD;  Location: [facility];  Service: Cardiovascular;  Laterality: Right;    Social History   Tobacco Use  . Smoking status: Current Some Day Smoker    Packs/day: 0.25    Types: Cigars    Start date: 09/03/1976  . Smokeless tobacco: Never Used  . Tobacco comment: chantix  Substance Use Topics  .  Alcohol use: Not Currently    Alcohol/week: 1.0 standard drinks    Types: 1 Cans of beer per week  . Drug use: No     Medication list has been reviewed and updated.  Current Meds  Medication  Sig  . albuterol (VENTOLIN HFA) 108 (90 Base) MCG/ACT inhaler Inhale 2 puffs into the lungs every 6 (six) hours as needed for wheezing or shortness of breath.  [name] aspirin 325 MG EC tablet Take 1 tablet by mouth daily.  . cilostazol (PLETAL) 50 MG tablet Take 100 tablets by mouth daily.   . clopidogrel (PLAVIX) 75 MG tablet Take 1 tablet by mouth daily.  . indomethacin (INDOCIN) 25 MG capsule Take 1 capsule (25 mg total) by mouth 2 (two) times daily as needed.  [name] ipratropium-albuterol (DUONEB) 0.5-2.5 (3) MG/3ML SOLN Take 3 mLs by nebulization QID.  [name] isosorbide mononitrate (IMDUR) 60 MG 24 hr tablet Take 90 mg by mouth daily.   [name] lisinopril (PRINIVIL,ZESTRIL) 10 MG tablet Take 1 tablet by mouth daily.  . metoprolol tartrate (LOPRESSOR) 50 MG tablet Take 50-75 mg by mouth 2 (two) times a day. Take 50MG  by mouth every morning and 75MG  every evening  . nitroGLYCERIN (NITROSTAT) 0.4 MG SL tablet Place 1 tablet under the tongue as needed.  . simvastatin (ZOCOR) 40 MG tablet Take 1 tablet (40 mg total) by mouth at bedtime.  . [DISCONTINUED] albuterol (VENTOLIN HFA) 108 (90 Base) MCG/ACT inhaler Inhale into the lungs every 6 (six) hours as needed for wheezing or shortness of breath.    PHQ 2/9 Scores 02/20/2019 10/28/2018 09/03/2018 10/17/2017  PHQ - 2 Score 0 0 0 0  PHQ- 9 Score - - - 0    BP Readings from Last 3 Encounters:  02/20/19 118/78  02/07/19 (!) 116/59  10/28/18 108/64    Physical Exam Vitals signs and nursing note reviewed.  Constitutional:      General: He is not in acute distress.    Appearance: He is well-developed.  HENT:     Head: Normocephalic and atraumatic.  Neck:     Musculoskeletal: Normal range of motion.  Cardiovascular:     Rate and Rhythm: Normal rate and regular rhythm.  Pulmonary:     Effort: Pulmonary effort is normal. No respiratory distress.     Breath sounds: Decreased breath sounds present. No wheezing or rhonchi.  Musculoskeletal: Normal range of motion.   Lymphadenopathy:     Cervical: No cervical adenopathy.  Skin:    General: Skin is warm and dry.     Capillary Refill: Capillary refill takes less than 2 seconds.     Findings: No rash.  Neurological:     General: No focal deficit present.     Mental Status: He is alert and oriented to person, place, and time.  Psychiatric:        Behavior: Behavior normal.        Thought Content: Thought content normal.     Wt Readings from Last 3 Encounters:  02/20/19 192 lb (87.1 kg)  02/07/19 185 lb 1.6 oz (84 kg)  10/28/18 191 lb 4.8 oz (86.8 kg)    BP 118/78   Pulse 95   Ht 6\' 1"  (1.854 m)   Wt 192 lb (87.1 kg)   SpO2 96%   BMI 25.33 kg/m   Assessment and Plan: 1. Simple chronic bronchitis ([place]) Not yet noticing any improvement in sx Explained that he is to use the Proair only when out of the house  as needed; otherwise use nebulizer qid PPV-23 done in 2015 - albuterol (VENTOLIN HFA) 108 (90 Base) MCG/ACT inhaler; Inhale 2 puffs into the lungs every 6 (six) hours as needed for wheezing or shortness of breath.  Dispense: 6.7 g; Refill: 5  2. Chest pain, unspecified type Recent workup was negative He has not had further chest pain Continue zocor and aspirin  3. Tobacco use disorder Start Chantix asap Started pack info given   Partially dictated using Editor, commissioning. Any errors are unintentional.  Halina Maidens, MD [facility]  02/20/2019

## 2019-02-26 DIAGNOSIS — I2510 Atherosclerotic heart disease of native coronary artery without angina pectoris: Secondary | ICD-10-CM | POA: Diagnosis not present

## 2019-02-26 DIAGNOSIS — E785 Hyperlipidemia, unspecified: Secondary | ICD-10-CM | POA: Diagnosis not present

## 2019-02-26 DIAGNOSIS — R0789 Other chest pain: Secondary | ICD-10-CM | POA: Diagnosis not present

## 2019-02-26 DIAGNOSIS — I10 Essential (primary) hypertension: Secondary | ICD-10-CM | POA: Diagnosis not present

## 2019-02-26 DIAGNOSIS — Z9889 Other specified postprocedural states: Secondary | ICD-10-CM | POA: Diagnosis not present

## 2019-03-18 ENCOUNTER — Encounter: Payer: Self-pay | Admitting: Internal Medicine

## 2019-03-18 NOTE — Chronic Care Management (AMB) (Signed)
°  Chronic Care Management   Outreach Note  03/18/2019 Name: Aaron [name] MRN: [id number] DOB: 06/13/1956  Referred by: Glean Hess, MD Reason for referral : Chronic Care Management (Initial CCM outreach )   An unsuccessful telephone outreach was attempted today. The patient was referred to the case management team by for assistance with chronic care management and care coordination.   Follow Up Plan: The care management team will reach out to the patient again over the next 7 days.   [place]  [email or web address]   ??[phone]

## 2019-03-21 DIAGNOSIS — J42 Unspecified chronic bronchitis: Secondary | ICD-10-CM | POA: Diagnosis not present

## 2019-03-24 ENCOUNTER — Telehealth: Payer: Self-pay | Admitting: Internal Medicine

## 2019-03-24 NOTE — Chronic Care Management (AMB) (Signed)
Chronic Care Management   Note  03/24/2019 Name: Aaron [name] MRN: [id number] DOB: 07/09/1956  Aaron [name] is a 63 y.o. year old male who is a primary care patient of Glean Hess, MD. I reached out to Colbert Ewing by phone today in response to a referral sent by [name] [facility] health plan.    [name] was given information about Chronic Care Management services today including:  1. CCM service includes personalized support from designated clinical staff supervised by his physician, including individualized plan of care and coordination with other care providers 2. 24/7 contact phone numbers for assistance for urgent and routine care needs. 3. Service will only be billed when office clinical staff spend 20 minutes or more in a month to coordinate care. 4. Only one practitioner may furnish and bill the service in a calendar month. 5. The patient may stop CCM services at any time (effective at the end of the month) by phone call to the office staff. 6. The patient will be responsible for cost sharing (co-pay) of up to 20% of the service fee (after annual deductible is met).  Patient agreed to services and verbal consent obtained.   Follow up plan: Telephone appointment with CCM team member scheduled for: 03/26/2019  [place]  [email or web address]   ??[phone]

## 2019-03-24 NOTE — Telephone Encounter (Signed)
This encounter was created in error - please disregard.

## 2019-03-24 NOTE — Chronic Care Management (AMB) (Signed)
°  Chronic Care Management   Outreach Note  Date : 03/18/2019 Name: Aaron [name] MRN: [id number] DOB: 01/11/1956  Referred by: Glean Hess, MD Reason for referral : Chronic Care Management (Initial CCM outreach was unsuccessful. )   An unsuccessful telephone outreach was attempted today. The patient was referred to the case management team by for assistance with chronic care management and care coordination.   Follow Up Plan: The care management team will reach out to the patient again over the next 7 days.   [place]  [email or web address]   ??[phone]

## 2019-03-26 ENCOUNTER — Telehealth: Payer: PPO

## 2019-03-31 ENCOUNTER — Ambulatory Visit: Payer: PPO

## 2019-04-01 ENCOUNTER — Ambulatory Visit (INDEPENDENT_AMBULATORY_CARE_PROVIDER_SITE_OTHER): Payer: PPO

## 2019-04-01 ENCOUNTER — Other Ambulatory Visit: Payer: Self-pay

## 2019-04-01 DIAGNOSIS — Z23 Encounter for immunization: Secondary | ICD-10-CM | POA: Diagnosis not present

## 2019-04-20 DIAGNOSIS — J42 Unspecified chronic bronchitis: Secondary | ICD-10-CM | POA: Diagnosis not present

## 2019-04-27 ENCOUNTER — Telehealth: Payer: Self-pay

## 2019-04-27 ENCOUNTER — Ambulatory Visit (INDEPENDENT_AMBULATORY_CARE_PROVIDER_SITE_OTHER): Payer: PPO

## 2019-04-27 ENCOUNTER — Other Ambulatory Visit: Payer: Self-pay

## 2019-04-27 VITALS — Ht 73.0 in | Wt 205.0 lb

## 2019-04-27 DIAGNOSIS — Z86010 Personal history of colonic polyps: Secondary | ICD-10-CM

## 2019-04-27 DIAGNOSIS — Z1211 Encounter for screening for malignant neoplasm of colon: Secondary | ICD-10-CM

## 2019-04-27 DIAGNOSIS — Z0000 Encounter for general adult medical examination without abnormal findings: Secondary | ICD-10-CM

## 2019-04-27 MED ORDER — NA SULFATE-K SULFATE-MG SULF 17.5-3.13-1.6 GM/177ML PO SOLN: 1.0000 | Freq: Once | ORAL | 0 refills | Status: AC

## 2019-04-27 NOTE — Patient Instructions (Signed)
[name] , Thank you for taking time to come for your Medicare Wellness Visit. I appreciate your ongoing commitment to your health goals. Please review the following plan we discussed and let me know if I can assist you in the future.   Screening recommendations/referrals: Colonoscopy: done 03/30/13. Referral to gastroenterology sent today for repeat screening colonoscopy.  Recommended yearly ophthalmology/optometry visit for glaucoma screening and checkup Recommended yearly dental visit for hygiene and checkup  Vaccinations: Influenza vaccine: done 04/01/19 Pneumococcal vaccine: done 07/03/14 Tdap vaccine: done 12/18/13 Shingles vaccine: Shingrix discussed. Please contact your pharmacy for coverage information.   Advanced directives: Please bring a copy of your health care power of attorney and living will to the office at your convenience once you have completed those documents.   Conditions/risks identified: If you wish to quit smoking, help is available. For free tobacco cessation program offerings call the [facility] at [phone] or Live Well Line at [phone]. You may also visit [email or web address] or email [email or web address] for more information on other programs.   Next appointment: Please follow up in one year for your Medicare Annual Wellness visit.    Preventive Care 40-64 Years, Male Preventive care refers to lifestyle choices and visits with your health care provider that can promote health and wellness. What does preventive care include?  A yearly physical exam. This is also called an annual well check.  Dental exams once or twice a year.  Routine eye exams. Ask your health care provider how often you should have your eyes checked.  Personal lifestyle choices, including:  Daily care of your teeth and gums.  Regular physical activity.  Eating a healthy diet.  Avoiding tobacco and drug use.  Limiting alcohol use.  Practicing safe  sex.  Taking low-dose aspirin every day starting at age 37. What happens during an annual well check? The services and screenings done by your health care provider during your annual well check will depend on your age, overall health, lifestyle risk factors, and family history of disease. Counseling  Your health care provider may ask you questions about your:  Alcohol use.  Tobacco use.  Drug use.  Emotional well-being.  Home and relationship well-being.  Sexual activity.  Eating habits.  Work and work Statistician. Screening  You may have the following tests or measurements:  Height, weight, and BMI.  Blood pressure.  Lipid and cholesterol levels. These may be checked every 5 years, or more frequently if you are over 18 years old.  Skin check.  Lung cancer screening. You may have this screening every year starting at age 33 if you have a 30-pack-year history of smoking and currently smoke or have quit within the past 15 years.  Fecal occult blood test (FOBT) of the stool. You may have this test every year starting at age 22.  Flexible sigmoidoscopy or colonoscopy. You may have a sigmoidoscopy every 5 years or a colonoscopy every 10 years starting at age 57.  Prostate cancer screening. Recommendations will vary depending on your family history and other risks.  Hepatitis C blood test.  Hepatitis B blood test.  Sexually transmitted disease (STD) testing.  Diabetes screening. This is done by checking your blood sugar (glucose) after you have not eaten for a while (fasting). You may have this done every 1-3 years. Discuss your test results, treatment options, and if necessary, the need for more tests with your health care provider. Vaccines  Your health care provider may recommend certain vaccines,  such as:  Influenza vaccine. This is recommended every year.  Tetanus, diphtheria, and acellular pertussis (Tdap, Td) vaccine. You may need a Td booster every 10 years.   Zoster vaccine. You may need this after age 37.  Pneumococcal 13-valent conjugate (PCV13) vaccine. You may need this if you have certain conditions and have not been vaccinated.  Pneumococcal polysaccharide (PPSV23) vaccine. You may need one or two doses if you smoke cigarettes or if you have certain conditions. Talk to your health care provider about which screenings and vaccines you need and how often you need them. This information is not intended to replace advice given to you by your health care provider. Make sure you discuss any questions you have with your health care provider. Document Released: 07/22/2015 Document Revised: 03/14/2016 Document Reviewed: 04/26/2015 Elsevier Interactive Patient Education  2017 [place] Prevention in the Home Falls can cause injuries. They can happen to people of all ages. There are many things you can do to make your home safe and to help prevent falls. What can I do on the outside of my home?  Regularly fix the edges of walkways and driveways and fix any cracks.  Remove anything that might make you trip as you walk through a door, such as a raised step or threshold.  Trim any bushes or trees on the path to your home.  Use bright outdoor lighting.  Clear any walking paths of anything that might make someone trip, such as rocks or tools.  Regularly check to see if handrails are loose or broken. Make sure that both sides of any steps have handrails.  Any raised decks and porches should have guardrails on the edges.  Have any leaves, snow, or ice cleared regularly.  Use sand or salt on walking paths during winter.  Clean up any spills in your garage right away. This includes oil or grease spills. What can I do in the bathroom?  Use night lights.  Install grab bars by the toilet and in the tub and shower. Do not use towel bars as grab bars.  Use non-skid mats or decals in the tub or shower.  If you need to sit down in the  shower, use a plastic, non-slip stool.  Keep the floor dry. Clean up any water that spills on the floor as soon as it happens.  Remove soap buildup in the tub or shower regularly.  Attach bath mats securely with double-sided non-slip rug tape.  Do not have throw rugs and other things on the floor that can make you trip. What can I do in the bedroom?  Use night lights.  Make sure that you have a light by your bed that is easy to reach.  Do not use any sheets or blankets that are too big for your bed. They should not hang down onto the floor.  Have a firm chair that has side arms. You can use this for support while you get dressed.  Do not have throw rugs and other things on the floor that can make you trip. What can I do in the kitchen?  Clean up any spills right away.  Avoid walking on wet floors.  Keep items that you use a lot in easy-to-reach places.  If you need to reach something above you, use a strong step stool that has a grab bar.  Keep electrical cords out of the way.  Do not use floor polish or wax that makes floors slippery. If you  must use wax, use non-skid floor wax.  Do not have throw rugs and other things on the floor that can make you trip. What can I do with my stairs?  Do not leave any items on the stairs.  Make sure that there are handrails on both sides of the stairs and use them. Fix handrails that are broken or loose. Make sure that handrails are as long as the stairways.  Check any carpeting to make sure that it is firmly attached to the stairs. Fix any carpet that is loose or worn.  Avoid having throw rugs at the top or bottom of the stairs. If you do have throw rugs, attach them to the floor with carpet tape.  Make sure that you have a light switch at the top of the stairs and the bottom of the stairs. If you do not have them, ask someone to add them for you. What else can I do to help prevent falls?  Wear shoes that:  Do not have high heels.   Have rubber bottoms.  Are comfortable and fit you well.  Are closed at the toe. Do not wear sandals.  If you use a stepladder:  Make sure that it is fully opened. Do not climb a closed stepladder.  Make sure that both sides of the stepladder are locked into place.  Ask someone to hold it for you, if possible.  Clearly mark and make sure that you can see:  Any grab bars or handrails.  First and last steps.  Where the edge of each step is.  Use tools that help you move around (mobility aids) if they are needed. These include:  Canes.  Walkers.  Scooters.  Crutches.  Turn on the lights when you go into a dark area. Replace any light bulbs as soon as they burn out.  Set up your furniture so you have a clear path. Avoid moving your furniture around.  If any of your floors are uneven, fix them.  If there are any pets around you, be aware of where they are.  Review your medicines with your doctor. Some medicines can make you feel dizzy. This can increase your chance of falling. Ask your doctor what other things that you can do to help prevent falls. This information is not intended to replace advice given to you by your health care provider. Make sure you discuss any questions you have with your health care provider. Document Released: 04/21/2009 Document Revised: 12/01/2015 Document Reviewed: 07/30/2014 Elsevier Interactive Patient Education  2017 [employer].

## 2019-04-27 NOTE — Progress Notes (Addendum)
Subjective:   Aaron [name] is a 63 y.o. male who presents for Medicare Annual/Subsequent preventive examination.  Virtual Visit via Telephone Note  I connected with Aaron [name] on 04/27/19 at 10:40 AM EDT by telephone and verified that I am speaking with the correct person using two identifiers.  Medicare Annual Wellness visit completed telephonically due to Covid-19 pandemic.   Location:  Patient: home Provider: office   I discussed the limitations, risks, security and privacy concerns of performing an evaluation and management service by telephone and the availability of in person appointments. The patient expressed understanding and agreed to proceed.  Some vital signs may be absent or patient reported.   Clemetine Marker, LPN     Review of Systems:   Cardiac Risk Factors include: dyslipidemia;male gender;hypertension;smoking/ tobacco exposure     Objective:    Vitals: Ht 6\' 1"  (1.854 m)    Wt 205 lb (93 kg)    BMI 27.05 kg/m   Body mass index is 27.05 kg/m.  Advanced Directives 04/27/2019 02/06/2019 02/06/2019 09/25/2018 09/15/2018 06/08/2018 10/17/2017  Does Patient Have a Medical Advance Directive? No No No No No No No  Would patient like information on creating a medical advance directive? No - Patient declined Yes (Inpatient - patient requests chaplain consult to create a medical advance directive) No - Patient declined No - Patient declined No - Patient declined - Yes (MAU/Ambulatory/Procedural Areas - Information given)    Tobacco Social History   Tobacco Use  Smoking Status Current Some Day Smoker   Packs/day: 0.25   Types: Cigars   Start date: 09/03/1976  Smokeless Tobacco Never Used  Tobacco Comment   chantix     Ready to quit: Not Answered Counseling given: Not Answered Comment: chantix   Clinical Intake:  Pre-visit preparation completed: Yes  Pain : No/denies pain     BMI - recorded: 27.05 Nutritional Status: BMI 25 -29  Overweight Nutritional Risks: None Diabetes: No  How often do you need to have someone help you when you read instructions, pamphlets, or other written materials from your doctor or pharmacy?: 1 - Never  Interpreter Needed?: No  Information entered by :: Clemetine Marker LPN  Past Medical History:  Diagnosis Date   Coronary artery disease    s/p PCI   Fracture of left ankle, closed, initial encounter 10/23/2017   Glaucoma    Gout    Hx of heart artery stent 01/12/2017   Hypercholesteremia    Hypertension    PAD (peripheral artery disease) ([place])    Pain in limb 07/13/2016   Unstable angina ([place]) 01/12/2017   Vertigo    Past Surgical History:  Procedure Laterality Date   APPENDECTOMY     cardiac stents   2012   COLONOSCOPY  03/30/2013   [place]   LOWER EXTREMITY ANGIOGRAPHY Right 09/25/2018   Procedure: LOWER EXTREMITY ANGIOGRAPHY;  Surgeon: Algernon Huxley, MD;  Location: [facility];  Service: Cardiovascular;  Laterality: Right;   Family History  Problem Relation Age of Onset   Hypertension Mother    Diabetes Mother    Heart failure Father        age 95   Cancer Father    Ovarian cancer Sister    Heart disease Brother    Social History   Socioeconomic History   Marital status: Single    Spouse name: Not on file   Number of children: 0   Years of education: Not on file  Highest education level: 12th grade  Occupational History   Occupation: Retired/Disabled  Scientist, product/process development strain: Not hard at all   Food insecurity    Worry: Never true    Inability: Never true   Transportation needs    Medical: No    Non-medical: No  Tobacco Use   Smoking status: Current Some Day Smoker    Packs/day: 0.25    Types: Cigars    Start date: 09/03/1976   Smokeless tobacco: Never Used   Tobacco comment: chantix  Substance and Sexual Activity   Alcohol use: Yes    Alcohol/week: 1.0 standard drinks    Types: 1 Cans of beer  per week   Drug use: No   Sexual activity: Not Currently    Birth control/protection: None  Lifestyle   Physical activity    Days per week: 7 days    Minutes per session: 40 min   Stress: Not at all  Relationships   Social connections    Talks on phone: Patient refused    Gets together: Patient refused    Attends religious service: Patient refused    Active member of club or organization: Patient refused    Attends meetings of clubs or organizations: Patient refused    Relationship status: Patient refused  Other Topics Concern   Not on file  Social History Narrative   Independent at baseline    Outpatient Encounter Medications as of 04/27/2019  Medication Sig   albuterol (VENTOLIN HFA) 108 (90 Base) MCG/ACT inhaler Inhale 2 puffs into the lungs every 6 (six) hours as needed for wheezing or shortness of breath.   aspirin 325 MG EC tablet Take 1 tablet by mouth daily.   cilostazol (PLETAL) 50 MG tablet Take 100 tablets by mouth daily.    clopidogrel (PLAVIX) 75 MG tablet Take 1 tablet by mouth daily.   indomethacin (INDOCIN) 25 MG capsule Take 1 capsule (25 mg total) by mouth 2 (two) times daily as needed.   isosorbide mononitrate (IMDUR) 60 MG 24 hr tablet Take 90 mg by mouth daily.    lisinopril (PRINIVIL,ZESTRIL) 10 MG tablet Take 1 tablet by mouth daily.   metoprolol tartrate (LOPRESSOR) 50 MG tablet Take 50-75 mg by mouth 2 (two) times a day. Take 50MG  by mouth every morning and 75MG  every evening   nitroGLYCERIN (NITROSTAT) 0.4 MG SL tablet Place 1 tablet under the tongue as needed.   simvastatin (ZOCOR) 40 MG tablet Take 1 tablet (40 mg total) by mouth at bedtime.   varenicline (CHANTIX) 0.5 MG tablet Take 0.5 mg by mouth 2 (two) times daily.   ipratropium-albuterol (DUONEB) 0.5-2.5 (3) MG/3ML SOLN Take 3 mLs by nebulization QID.   No facility-administered encounter medications on file as of 04/27/2019.     Activities of Daily Living In your present  state of health, do you have any difficulty performing the following activities: 04/27/2019 02/07/2019  Hearing? N -  Comment declines hearing aids -  Vision? N -  Difficulty concentrating or making decisions? N -  Walking or climbing stairs? N -  Dressing or bathing? N -  Doing errands, shopping? N N  Preparing Food and eating ? N -  Using the Toilet? N -  In the past six months, have you accidently leaked urine? N -  Do you have problems with loss of bowel control? N -  Managing your Medications? N -  Managing your Finances? N -  Housekeeping or managing your Housekeeping? N -  Some recent data might be hidden    Patient Care Team: Glean Hess, MD as PCP - General (Internal Medicine) Isaias Cowman, MD as PCP - Cardiology (Cardiology) Hessie Knows, MD as Consulting Physician (Orthopedic Surgery) Lucky Cowboy Erskine Squibb, MD as Referring Physician (Vascular Surgery) Ottie Glazier, MD as Consulting Physician (Pulmonary Disease) Minor, Dalbert Garnet, RN as [place] Management   Assessment:   This is a routine wellness examination for Aaron [name].  Exercise Activities and Dietary recommendations Current Exercise Habits: Home exercise routine, Type of exercise: walking, Time (Minutes): 40, Frequency (Times/Week): 7, Weekly Exercise (Minutes/Week): 280, Intensity: Moderate, Exercise limited by: cardiac condition(s)  Goals     DIET - INCREASE WATER INTAKE     Recommend to drink at least 6-8 8oz glasses of water per day.     Quit Smoking     Pt states he would like to stop smoking this year       Fall Risk Fall Risk  04/27/2019 10/28/2018 09/03/2018 10/17/2017  Falls in the past year? 0 0 0 Yes  Number falls in past yr: 0 0 0 1  Injury with Fall? 0 0 0 No  Comment - - - went to bathroom and lost consciousness  Risk for fall due to : - - - Impaired balance/gait;Impaired vision  Follow up Falls prevention discussed Falls evaluation completed - Falls evaluation  completed;Education provided;Falls prevention discussed    FALL RISK PREVENTION PERTAINING TO THE HOME:  Any stairs in or around the home? No  If so, do they handrails? No   Home free of loose throw rugs in walkways, pet beds, electrical cords, etc? Yes  Adequate lighting in your home to reduce risk of falls? Yes   ASSISTIVE DEVICES UTILIZED TO PREVENT FALLS:  Life alert? No  Use of a cane, walker or w/c? No  Grab bars in the bathroom? No  Shower chair or bench in shower? No  Elevated toilet seat or a handicapped toilet? No   DME ORDERS:  DME order needed?  No   TIMED UP AND GO:  Was the test performed? No . Telephonic visit.   Education: Fall risk prevention has been discussed.  Intervention(s) required? No   .  Depression Screen PHQ 2/9 Scores 04/27/2019 02/20/2019 10/28/2018 09/03/2018  PHQ - 2 Score 0 0 0 0  PHQ- 9 Score - - - -    Cognitive Function     6CIT Screen 04/27/2019 10/17/2017  What Year? 0 points 0 points  What month? 0 points 0 points  What time? 0 points 0 points  Count back from 20 0 points 0 points  Months in reverse 0 points 0 points  Repeat phrase 0 points 2 points  Total Score 0 2    Immunization History  Administered Date(s) Administered   Influenza,inj,Quad PF,6+ Mos 05/19/2015, 04/06/2016, 04/02/2017, 03/24/2018, 04/01/2019   Pneumococcal Polysaccharide-23 11/14/2012, 07/03/2014   Tdap 12/18/2013    Qualifies for Shingles Vaccine? Yes . Due for Shingrix. Education has been provided regarding the importance of this vaccine. Pt has been advised to call insurance company to determine out of pocket expense. Advised may also receive vaccine at local pharmacy or Health Dept. Verbalized acceptance and understanding.  Tdap: Up to date  Flu Vaccine: Up to date  Pneumococcal Vaccine: Up to date   Screening Tests Health Maintenance  Topic Date Due   COLONOSCOPY  03/30/2018   TETANUS/TDAP  12/19/2023   INFLUENZA VACCINE   Completed   Hepatitis C  Screening  Completed   HIV Screening  Completed   Cancer Screenings:  Colorectal Screening: Completed 03/30/13. Repeat every 5 years; No longer required. Referral to GI placed today. Pt aware the office will call re: appt.  Lung Cancer Screening: (Low Dose CT Chest recommended if Age 32-80 years, 30 pack-year currently smoking OR have quit w/in 15years.) does qualify. Pt followed by pulmonology.    Additional Screening:  Hepatitis C Screening: does qualify; Completed 05/19/15  Vision Screening: Recommended annual ophthalmology exams for early detection of glaucoma and other disorders of the eye. Is the patient up to date with their annual eye exam?  Yes  Who is the provider or what is the name of the office in which the pt attends annual eye exams? [facility] Screening: Recommended annual dental exams for proper oral hygiene  Community Resource Referral:  CRR required this visit?  No       Plan:    I have personally reviewed and addressed the Medicare Annual Wellness questionnaire and have noted the following in the patients chart:  A. Medical and social history B. Use of alcohol, tobacco or illicit drugs  C. Current medications and supplements D. Functional ability and status E.  Nutritional status F.  Physical activity G. Advance directives H. List of other physicians I.  Hospitalizations, surgeries, and ER visits in previous 12 months J.  [place] such as hearing and vision if needed, cognitive and depression L. Referrals and appointments   In addition, I have reviewed and discussed with patient certain preventive protocols, quality metrics, and best practice recommendations. A written personalized care plan for preventive services as well as general preventive health recommendations were provided to patient.   Signed,  Clemetine Marker, LPN Nurse Health Advisor   Nurse Notes: none

## 2019-04-27 NOTE — Telephone Encounter (Signed)
Gastroenterology Pre-Procedure Review  Request Date: Monday 05/18/19 Requesting Physician: Dr. Vicente Males  PATIENT REVIEW QUESTIONS: The patient responded to the following health history questions as indicated:    1. Are you having any GI issues? no 2. Do you have a personal history of Polyps? yes (2015 polyps) 3. Do you have a family history of Colon Cancer or Polyps? no 4. Diabetes Mellitus? no 5. Joint replacements in the past 12 months?no 6. Major health problems in the past 3 months?no 7. Any artificial heart valves, MVP, or defibrillator?no    MEDICATIONS & ALLERGIES:    Patient reports the following regarding taking any anticoagulation/antiplatelet therapy:   Plavix, Coumadin, Eliquis, Xarelto, Lovenox, Pradaxa, Brilinta, or Effient? yes (Plavix and Celestozol, 325mg  Aspirin) Aspirin? yes (Aspirin 325 mg daily)  Patient confirms/reports the following medications:  Current Outpatient Medications  Medication Sig Dispense Refill  . albuterol (VENTOLIN HFA) 108 (90 Base) MCG/ACT inhaler Inhale 2 puffs into the lungs every 6 (six) hours as needed for wheezing or shortness of breath. 6.7 g 5  . aspirin 325 MG EC tablet Take 1 tablet by mouth daily.    . cilostazol (PLETAL) 50 MG tablet Take 100 tablets by mouth daily.     . clopidogrel (PLAVIX) 75 MG tablet Take 1 tablet by mouth daily.    . indomethacin (INDOCIN) 25 MG capsule Take 1 capsule (25 mg total) by mouth 2 (two) times daily as needed. 30 capsule 1  . ipratropium-albuterol (DUONEB) 0.5-2.5 (3) MG/3ML SOLN Take 3 mLs by nebulization QID.    [name] isosorbide mononitrate (IMDUR) 60 MG 24 hr tablet Take 90 mg by mouth daily.     [name] lisinopril (PRINIVIL,ZESTRIL) 10 MG tablet Take 1 tablet by mouth daily.    . metoprolol tartrate (LOPRESSOR) 50 MG tablet Take 50-75 mg by mouth 2 (two) times a day. Take 50MG  by mouth every morning and 75MG  every evening    . nitroGLYCERIN (NITROSTAT) 0.4 MG SL tablet Place 1 tablet under the tongue as  needed.    . simvastatin (ZOCOR) 40 MG tablet Take 1 tablet (40 mg total) by mouth at bedtime. 30 tablet 5  . varenicline (CHANTIX) 0.5 MG tablet Take 0.5 mg by mouth 2 (two) times daily.     No current facility-administered medications for this visit.     Patient confirms/reports the following allergies:  No Known Allergies  No orders of the defined types were placed in this encounter.   AUTHORIZATION INFORMATION Primary Insurance: 1D#: Group #:  Secondary Insurance: 1D#: Group #:  SCHEDULE INFORMATION: Date: Monday 05/18/19 Time: Location:[facility]

## 2019-04-28 ENCOUNTER — Telehealth: Payer: Self-pay | Admitting: Gastroenterology

## 2019-04-28 NOTE — Telephone Encounter (Signed)
Patient has requested to cancel colonoscopy with Dr. Vicente Males on 05/18/19.  Colonoscopy has been canceled with Robyn in Endoscopy.  Thanks [employer]

## 2019-04-28 NOTE — Telephone Encounter (Signed)
Patient called  & would like to cancel his colonoscopy  & he will call & r/s in a month or so.

## 2019-05-01 DIAGNOSIS — Z1159 Encounter for screening for other viral diseases: Secondary | ICD-10-CM | POA: Diagnosis not present

## 2019-05-04 DIAGNOSIS — F17290 Nicotine dependence, other tobacco product, uncomplicated: Secondary | ICD-10-CM | POA: Diagnosis not present

## 2019-05-04 DIAGNOSIS — F17200 Nicotine dependence, unspecified, uncomplicated: Secondary | ICD-10-CM | POA: Diagnosis not present

## 2019-05-04 DIAGNOSIS — R0602 Shortness of breath: Secondary | ICD-10-CM | POA: Diagnosis not present

## 2019-05-18 ENCOUNTER — Ambulatory Visit: Admit: 2019-05-18 | Payer: PPO | Admitting: Gastroenterology

## 2019-05-18 SURGERY — COLONOSCOPY WITH PROPOFOL
Anesthesia: General

## 2019-05-20 ENCOUNTER — Telehealth: Payer: Self-pay

## 2019-05-20 NOTE — Telephone Encounter (Signed)
Patient called stating he woke up lastnight in his sleep choking and could not catch his breathe. This has never happened before. Said He woke up laying flat on his back. After he set up, phlegm released from his nose and mouth and he could breathe again. He does have a slight scratchy throat today from the irritation last night but otherwise he is fine.  Spoke with Dr. Army Melia and informed pt it could possibly be acid reflux. Told him per Dr. Army Melia to wait atleast 4 hours after eating before laying down, and be sure to prop his head up on 2 pillows when sleeping at night.  If this happens again, patient will call to schedule an appt for possible acid reflux.  Benedict Needy, CMA

## 2019-05-21 DIAGNOSIS — J42 Unspecified chronic bronchitis: Secondary | ICD-10-CM | POA: Diagnosis not present

## 2019-06-03 ENCOUNTER — Telehealth: Payer: Self-pay

## 2019-06-03 ENCOUNTER — Other Ambulatory Visit: Payer: Self-pay | Admitting: Internal Medicine

## 2019-06-03 DIAGNOSIS — Z23 Encounter for immunization: Secondary | ICD-10-CM

## 2019-06-03 DIAGNOSIS — M109 Gout, unspecified: Secondary | ICD-10-CM

## 2019-06-03 MED ORDER — SHINGRIX 50 MCG/0.5ML IM SUSR: 0.5000 mL | Freq: Once | INTRAMUSCULAR | 1 refills | Status: AC

## 2019-06-03 MED ORDER — INDOMETHACIN 25 MG PO CAPS: 25.0000 mg | ORAL_CAPSULE | Freq: Two times a day (BID) | ORAL | 1 refills | Status: DC | PRN

## 2019-06-03 NOTE — Telephone Encounter (Signed)
Dr Army Melia filled his gout meds however when I called him he mentioned something about a Shingrix vaccine.  He has Medicare so he needs a rx for it & [facility] told him that he needs to get a new one instead of the one we sent during the summer.

## 2019-06-03 NOTE — Telephone Encounter (Signed)
Pt needs new Rx for Shingles Vaccine to get at pharmacy.

## 2019-06-03 NOTE — Telephone Encounter (Signed)
Patient wants to see if you can refill his gout medication or does he need to come in for an appt

## 2019-06-20 DIAGNOSIS — J42 Unspecified chronic bronchitis: Secondary | ICD-10-CM | POA: Diagnosis not present

## 2019-06-23 ENCOUNTER — Emergency Department
Admission: EM | Admit: 2019-06-23 | Discharge: 2019-06-23 | Disposition: A | Discharge status: Home / Self Care | Payer: PPO | Attending: Student | Admitting: Student

## 2019-06-23 ENCOUNTER — Emergency Department: Payer: PPO

## 2019-06-23 ENCOUNTER — Ambulatory Visit: Payer: PPO | Admitting: Internal Medicine

## 2019-06-23 ENCOUNTER — Other Ambulatory Visit: Payer: Self-pay

## 2019-06-23 DIAGNOSIS — Z79899 Other long term (current) drug therapy: Secondary | ICD-10-CM | POA: Insufficient documentation

## 2019-06-23 DIAGNOSIS — I10 Essential (primary) hypertension: Secondary | ICD-10-CM | POA: Diagnosis not present

## 2019-06-23 DIAGNOSIS — Z7902 Long term (current) use of antithrombotics/antiplatelets: Secondary | ICD-10-CM | POA: Insufficient documentation

## 2019-06-23 DIAGNOSIS — R079 Chest pain, unspecified: Secondary | ICD-10-CM | POA: Diagnosis not present

## 2019-06-23 DIAGNOSIS — Z7982 Long term (current) use of aspirin: Secondary | ICD-10-CM | POA: Diagnosis not present

## 2019-06-23 DIAGNOSIS — I2510 Atherosclerotic heart disease of native coronary artery without angina pectoris: Secondary | ICD-10-CM | POA: Diagnosis not present

## 2019-06-23 DIAGNOSIS — F17290 Nicotine dependence, other tobacco product, uncomplicated: Secondary | ICD-10-CM | POA: Insufficient documentation

## 2019-06-23 DIAGNOSIS — R0789 Other chest pain: Secondary | ICD-10-CM | POA: Diagnosis not present

## 2019-06-23 LAB — CBC
HCT: 49.1 % (ref 39.0–52.0)
Hemoglobin: 17.2 g/dL — ABNORMAL HIGH (ref 13.0–17.0)
MCH: 32.5 pg (ref 26.0–34.0)
MCHC: 35 g/dL (ref 30.0–36.0)
MCV: 92.6 fL (ref 80.0–100.0)
Platelets: 215 10*3/uL (ref 150–400)
RBC: 5.3 MIL/uL (ref 4.22–5.81)
RDW: 13.1 % (ref 11.5–15.5)
WBC: 8.7 10*3/uL (ref 4.0–10.5)
nRBC: 0 % (ref 0.0–0.2)

## 2019-06-23 LAB — COMPREHENSIVE METABOLIC PANEL
ALT: 51 U/L — ABNORMAL HIGH (ref 0–44)
AST: 28 U/L (ref 15–41)
Albumin: 4.4 g/dL (ref 3.5–5.0)
Alkaline Phosphatase: 58 U/L (ref 38–126)
Anion gap: 11 (ref 5–15)
BUN: 18 mg/dL (ref 8–23)
CO2: 26 mmol/L (ref 22–32)
Calcium: 9.4 mg/dL (ref 8.9–10.3)
Chloride: 100 mmol/L (ref 98–111)
Creatinine, Ser: 1 mg/dL (ref 0.61–1.24)
GFR calc Af Amer: >60 mL/min (ref >60)
GFR calc non Af Amer: >60 mL/min (ref >60)
Glucose, Bld: 123 mg/dL — ABNORMAL HIGH (ref 70–99)
Potassium: 4.4 mmol/L (ref 3.5–5.1)
Sodium: 137 mmol/L (ref 135–145)
Total Bilirubin: 0.7 mg/dL (ref 0.3–1.2)
Total Protein: 7.8 g/dL (ref 6.5–8.1)

## 2019-06-23 LAB — LIPASE, BLOOD: Lipase: 39 U/L (ref 11–51)

## 2019-06-23 LAB — TROPONIN I (HIGH SENSITIVITY)
Troponin I (High Sensitivity): 4 ng/L (ref <18)
Troponin I (High Sensitivity): 3 ng/L (ref <18)

## 2019-06-23 MED ORDER — PANTOPRAZOLE SODIUM 40 MG IV SOLR
40.0000 mg | Freq: Once | INTRAVENOUS | Status: AC
  Administered 2019-06-23: 40 mg via INTRAVENOUS
  Filled 2019-06-23: qty 40

## 2019-06-23 MED ORDER — ALUM & MAG HYDROXIDE-SIMETH 200-200-20 MG/5ML PO SUSP
30.0000 mL | Freq: Once | ORAL | Status: AC
  Administered 2019-06-23: 30 mL via ORAL
  Filled 2019-06-23: qty 30

## 2019-06-23 MED ORDER — ACETAMINOPHEN 500 MG PO TABS
1000.0000 mg | ORAL_TABLET | Freq: Once | ORAL | Status: AC
  Administered 2019-06-23: 08:00:00 1000 mg via ORAL
  Filled 2019-06-23: qty 2

## 2019-06-23 MED ORDER — FAMOTIDINE 20 MG PO TABS: 20.0000 mg | ORAL_TABLET | Freq: Two times a day (BID) | ORAL | 1 refills | Status: DC

## 2019-06-23 NOTE — ED Provider Notes (Signed)
[facility] Emergency Department Provider Note  ____________________________________________   First MD Initiated Contact with Patient 06/23/19 [phone]     (approximate)  I have reviewed the triage vital signs and the nursing notes.  History  Chief Complaint Chest Pain    HPI Aaron [name] is a 63 y.o. male with hx of CAD s/p PCI, HTN, HLD, PAD who presents for chest pain x 3 days. Patient states he has been waking up in the morning with central chest pain. He describes it as sharp, central in location, radiates somewhat up this throat/neck. 8/10 in severity. No alleviating/aggravating factors. The pain comes and goes w/o any specific inciting factors. No associated nausea or SOB, but he does report chronic dyspnea on exertion. He was recently admitted in August for chest pain observation. Had a NM myocardial perfusion 02/2019 with normal LV function, normal wall motion, no evidence of scar or ischemia. He denies any history of GERD. He has intermittently had a bad taste in his mouth/nose area, which is recent.    Past Medical Hx Past Medical History:  Diagnosis Date  . Coronary artery disease    s/p PCI  . Fracture of left ankle, closed, initial encounter 10/23/2017  . Glaucoma   . Gout   . Hx of heart artery stent 01/12/2017  . Hypercholesteremia   . Hypertension   . PAD (peripheral artery disease) ([place])   . Pain in limb 07/13/2016  . Unstable angina ([place]) 01/12/2017  . Vertigo     Problem List Patient Active Problem List   Diagnosis Date Noted  . Chest pain 02/06/2019  . Mixed hyperlipidemia 10/28/2018  . Syncope 09/16/2017  . Tobacco use disorder 07/13/2016  . Elevated blood sugar 07/27/2015  . Controlled gout 12/22/2014  . CAD in native artery 10/09/2014  . Essential (primary) hypertension 10/09/2014  . Dupuytren's disease of palm 10/09/2014  . Psoriasis 10/09/2014  . Pain in shoulder 10/09/2014  . H/O cardiac catheterization 01/27/2014  .  Peripheral vascular disease ([place]) 01/27/2014    Past Surgical Hx Past Surgical History:  Procedure Laterality Date  . APPENDECTOMY    . cardiac stents   2012  . COLONOSCOPY  03/30/2013   [place]  . LOWER EXTREMITY ANGIOGRAPHY Right 09/25/2018   Procedure: LOWER EXTREMITY ANGIOGRAPHY;  Surgeon: Algernon Huxley, MD;  Location: [facility];  Service: Cardiovascular;  Laterality: Right;    Medications Prior to Admission medications   Medication Sig Start Date End Date Taking? Authorizing Provider  albuterol (VENTOLIN HFA) 108 (90 Base) MCG/ACT inhaler Inhale 2 puffs into the lungs every 6 (six) hours as needed for wheezing or shortness of breath. 02/20/19   Glean Hess, MD  aspirin 325 MG EC tablet Take 1 tablet by mouth daily.    [provider]  cilostazol (PLETAL) 50 MG tablet Take 100 tablets by mouth daily.     [provider]  clopidogrel (PLAVIX) 75 MG tablet Take 1 tablet by mouth daily.    [provider]  indomethacin (INDOCIN) 25 MG capsule Take 1 capsule (25 mg total) by mouth 2 (two) times daily as needed. 06/03/19   Glean Hess, MD  ipratropium-albuterol (DUONEB) 0.5-2.5 (3) MG/3ML SOLN Take 3 mLs by nebulization QID.    [provider]  isosorbide mononitrate (IMDUR) 60 MG 24 hr tablet Take 90 mg by mouth daily.  09/04/16   [provider]  lisinopril (PRINIVIL,ZESTRIL) 10 MG tablet Take 1 tablet by mouth daily.  [provider]  metoprolol tartrate (LOPRESSOR) 50 MG tablet Take 50-75 mg by mouth 2 (two) times a day. Take 50MG  by mouth every morning and 75MG  every evening    [provider]  nitroGLYCERIN (NITROSTAT) 0.4 MG SL tablet Place 1 tablet under the tongue as needed.    [provider]  simvastatin (ZOCOR) 40 MG tablet Take 1 tablet (40 mg total) by mouth at bedtime. 05/21/15   Glean Hess, MD  varenicline (CHANTIX) 0.5 MG tablet Take 0.5 mg by mouth 2 (two) times daily.     [provider]    Allergies Patient has no known allergies.  Family Hx Family History  Problem Relation Age of Onset  . Hypertension Mother   . Diabetes Mother   . Heart failure Father        age 29  . Cancer Father   . Ovarian cancer Sister   . Heart disease Brother     Social Hx Social History   Tobacco Use  . Smoking status: Current Some Day Smoker    Packs/day: 0.25    Types: Cigars    Start date: 09/03/1976  . Smokeless tobacco: Never Used  . Tobacco comment: chantix  Substance Use Topics  . Alcohol use: Yes    Alcohol/week: 1.0 standard drinks    Types: 1 Cans of beer per week  . Drug use: No     Review of Systems  Constitutional: Negative for fever, chills. Eyes: Negative for visual changes. ENT: Negative for sore throat. Cardiovascular: + for chest pain. Respiratory: Negative for shortness of breath. Gastrointestinal: Negative for nausea, vomiting.  Genitourinary: Negative for dysuria. Musculoskeletal: Negative for leg swelling. Skin: Negative for rash. Neurological: Negative for for headaches.   Physical Exam  Vital Signs: ED Triage Vitals  Enc Vitals Group     BP 06/23/19 0613 108/66     Pulse Rate 06/23/19 0613 65     Resp 06/23/19 0613 18     Temp 06/23/19 0613 97.9 F (36.6 C)     Temp Source 06/23/19 0613 Oral     SpO2 06/23/19 0613 98 %     Weight 06/23/19 0613 200 lb (90.7 kg)     Height 06/23/19 0613 6' (1.829 m)     Head Circumference --      Peak Flow --      Pain Score 06/23/19 0616 8     Pain Loc --      Pain Edu? --      Excl. in [place]? --     Constitutional: Alert and oriented.  Head: Normocephalic. Atraumatic. Eyes: Conjunctivae clear. Sclera anicteric. Nose: No congestion. No rhinorrhea. Mouth/Throat: Wearing mask.  Neck: No stridor.   Cardiovascular: Normal rate, regular rhythm. Extremities well perfused. Respiratory: Normal respiratory effort.  Lungs CTAB. No wheezing. Gastrointestinal: Soft. Non-tender.  Non-distended.  Musculoskeletal: No lower extremity edema. No deformities. Neurologic:  Normal speech and language. No gross focal neurologic deficits are appreciated.  Skin: Skin is warm, dry and intact. No rash noted. Psychiatric: Mood and affect are appropriate for situation.  EKG  Personally reviewed.   Rate: 63 Rhythm: sinus Axis: normal Intervals: WNL Borderline ST changes in inferior leads, unchanged from prior No STEMI    Radiology  CXR: IMPRESSION:  Stable chest. No evidence of acute disease.    Procedures  Procedure(s) performed (including critical care):  Procedures   Initial Impression / Assessment and Plan / ED Course  63 y.o. male who presents to  the ED for chest pain, as above  Ddx: ACS, GERD, MSK. No hypoxia, tachycardia, or tachypnea suggestive of PE.  Doubt dissection, he has equal and symmetric distal pulses, not hypertensive, no associated neurological signs/symptoms.  Will obtain labs, EKG, XR. Received nitro and ASA already at home and w/ EMS.  EKG unchanged from prior and initial troponin negative. He was recently admitted in August for chest pain observation. Had a NM myocardial perfusion 02/2019 with normal LV function, normal wall motion, no evidence of scar or ischemia, which is reassuring. Suspect GERD/reflux based on description, especially since he gets symptoms in the morning after laying supine. Will give GI cocktail, PPI, reassess. Given his cardiac history, will plan for repeat/delta troponin.   Repeat troponin again negative and in fact down trending (4, 3). He reports improvement after GI cocktail.  Given negative work-up, will plan for discharge with Rx for famotidine.  Advised PCP follow-up as well as close cardiology follow-up.  Patient voices understanding of the plan and is comfortable with discharge.  Discussed strict return precautions.   Final Clinical Impression(s) / ED Diagnosis  Final diagnoses:  Chest pain in adult        Note:  This document was prepared using Dragon voice recognition software and may include unintentional dictation errors.   Lilia Pro., MD 06/23/19 [phone]

## 2019-06-23 NOTE — Discharge Instructions (Signed)
Thank you for letting us take care of you in the emergency department today.   Please continue to take any regular, prescribed medications.   New medications we have prescribed:  - Famotidine - for reflux, take as directed  Please follow up with: - Your primary care doctor to review your ER visit and follow up on your symptoms.  - Your cardiology doctor  Please return to the ER for any new or worsening symptoms.

## 2019-06-23 NOTE — ED Triage Notes (Signed)
Pt in with co chest pain x 3 days.States to left shoulder radiating to jaw and back. Has taken nitros x 3 yesterday without relief. States hx of stent placement, and has had dizziness.

## 2019-06-30 ENCOUNTER — Inpatient Hospital Stay: Payer: PPO | Admitting: Internal Medicine

## 2019-07-20 ENCOUNTER — Other Ambulatory Visit: Payer: Self-pay | Admitting: Internal Medicine

## 2019-07-21 ENCOUNTER — Ambulatory Visit (INDEPENDENT_AMBULATORY_CARE_PROVIDER_SITE_OTHER): Payer: PPO | Admitting: Internal Medicine

## 2019-07-21 ENCOUNTER — Encounter: Payer: Self-pay | Admitting: Internal Medicine

## 2019-07-21 ENCOUNTER — Other Ambulatory Visit: Payer: Self-pay

## 2019-07-21 VITALS — BP 118/70 | HR 76 | Ht 72.0 in | Wt 202.0 lb

## 2019-07-21 DIAGNOSIS — I2510 Atherosclerotic heart disease of native coronary artery without angina pectoris: Secondary | ICD-10-CM

## 2019-07-21 DIAGNOSIS — K219 Gastro-esophageal reflux disease without esophagitis: Secondary | ICD-10-CM

## 2019-07-21 DIAGNOSIS — J42 Unspecified chronic bronchitis: Secondary | ICD-10-CM | POA: Diagnosis not present

## 2019-07-21 DIAGNOSIS — F1020 Alcohol dependence, uncomplicated: Secondary | ICD-10-CM | POA: Insufficient documentation

## 2019-07-21 DIAGNOSIS — I739 Peripheral vascular disease, unspecified: Secondary | ICD-10-CM | POA: Diagnosis not present

## 2019-07-21 MED ORDER — PANTOPRAZOLE SODIUM 40 MG PO TBEC: 40.0000 mg | DELAYED_RELEASE_TABLET | Freq: Every day | ORAL | 3 refills | Status: DC

## 2019-07-21 NOTE — Progress Notes (Signed)
Date:  07/21/2019   Name:  Aaron [name]   DOB:  04-19-1956   MRN:  [id number]   Chief Complaint: Gastroesophageal Reflux (Follow up from [facility]. Still having constant indigestion.) and Abdominal Pain (Constant feeling of bloated and stomach is always growling and moving bowels. ) Patient was seen in ED 06/23/2019 for chest pain felt to be reflux.  EKG was unchanged from prior and troponin negative.  He was given a GI cocktail with improvement.  Given a recent NM Myocardial perfusion scan in 02/2019 that was reassuring, he was discharged home on famotidine.  Gastroesophageal Reflux He complains of abdominal pain, heartburn and water brash. He reports no chest pain, no choking, no coughing, no dysphagia, no early satiety, no nausea, no sore throat or no wheezing. This is a new problem. The current episode started 1 to 4 weeks ago. The problem occurs frequently. The problem has been unchanged. The heartburn is located in the substernum. The heartburn is of moderate intensity. The heartburn does not wake him from sleep. The heartburn limits his activity. The heartburn doesn't change with position. Nothing aggravates the symptoms. Pertinent negatives include no anemia, fatigue, melena, orthopnea or weight loss. Risk factors include smoking/tobacco exposure and ETOH use. He has tried a histamine-2 antagonist for the symptoms. The treatment provided mild relief.    Lab Results  Component Value Date   CREATININE 1.00 06/23/2019   BUN 18 06/23/2019   NA 137 06/23/2019   K 4.4 06/23/2019   CL 100 06/23/2019   CO2 26 06/23/2019   Lab Results  Component Value Date   CHOL 121 02/06/2019   HDL 31 (L) 02/06/2019   LDLCALC 62 02/06/2019   TRIG 141 02/06/2019   CHOLHDL 3.9 02/06/2019   Lab Results  Component Value Date   TSH 3.214 02/06/2019   Lab Results  Component Value Date   HGBA1C 5.6 10/28/2018    Review of Systems  Constitutional: Negative for chills, fatigue, fever and weight loss.  Unexpected weight change: has gained 10 lbs in the last few months.  HENT: Negative for sore throat.   Respiratory: Negative for cough, choking and wheezing.   Cardiovascular: Negative for chest pain, palpitations and leg swelling.  Gastrointestinal: Positive for abdominal pain and heartburn. Negative for blood in stool, constipation, dysphagia, melena, nausea and vomiting.  Genitourinary: Negative for difficulty urinating.  Neurological: Negative for dizziness, light-headedness and headaches.    Patient Active Problem List   Diagnosis Date Noted  . Alcoholic intoxication without complication ([place]) 99991111  . Chest pain 02/06/2019  . Mixed hyperlipidemia 10/28/2018  . Syncope 09/16/2017  . Tobacco use disorder 07/13/2016  . Elevated blood sugar 07/27/2015  . Controlled gout 12/22/2014  . Coronary artery disease involving native coronary artery of native heart with angina pectoris ([place]) 10/09/2014  . Essential (primary) hypertension 10/09/2014  . Dupuytren's disease of palm 10/09/2014  . Psoriasis 10/09/2014  . Pain in shoulder 10/09/2014  . H/O cardiac catheterization 01/27/2014  . Peripheral vascular disease ([place]) 01/27/2014    No Known Allergies  Past Surgical History:  Procedure Laterality Date  . APPENDECTOMY    . cardiac stents   2012  . COLONOSCOPY  03/30/2013   [place]  . LOWER EXTREMITY ANGIOGRAPHY Right 09/25/2018   Procedure: LOWER EXTREMITY ANGIOGRAPHY;  Surgeon: Algernon Huxley, MD;  Location: [facility];  Service: Cardiovascular;  Laterality: Right;    Social History   Tobacco Use  . Smoking status: Current Some Day  Smoker    Packs/day: 0.25    Types: Cigars    Start date: 09/03/1976  . Smokeless tobacco: Never Used  . Tobacco comment: chantix  Substance Use Topics  . Alcohol use: Not Currently    Comment: quit drinking around 04/2019  . Drug use: No     Medication list has been reviewed and updated.  Current Meds  Medication Sig  .  albuterol (VENTOLIN HFA) 108 (90 Base) MCG/ACT inhaler Inhale 2 puffs into the lungs every 6 (six) hours as needed for wheezing or shortness of breath.  [name] aspirin 325 MG EC tablet Take 1 tablet by mouth daily.  . cilostazol (PLETAL) 50 MG tablet Take by mouth.  . clopidogrel (PLAVIX) 75 MG tablet Take 1 tablet by mouth daily.  . Fluticasone-Umeclidin-Vilant (TRELEGY ELLIPTA) 100-62.5-25 MCG/INH AEPB Inhale 1 puff into the lungs daily.  . indomethacin (INDOCIN) 25 MG capsule Take 1 capsule (25 mg total) by mouth 2 (two) times daily as needed.  [name] ipratropium-albuterol (DUONEB) 0.5-2.5 (3) MG/3ML SOLN Take 3 mLs by nebulization QID.  [name] isosorbide mononitrate (IMDUR) 60 MG 24 hr tablet Take 90 mg by mouth daily.   [name] lisinopril (PRINIVIL,ZESTRIL) 10 MG tablet Take 1 tablet by mouth daily.  . metoprolol tartrate (LOPRESSOR) 50 MG tablet Take 50-75 mg by mouth 2 (two) times a day. Take 50MG  by mouth every morning and 75MG  every evening  . nitroGLYCERIN (NITROSTAT) 0.4 MG SL tablet Place 1 tablet under the tongue as needed.  . simvastatin (ZOCOR) 40 MG tablet Take 1 tablet (40 mg total) by mouth at bedtime.  . [DISCONTINUED] famotidine (PEPCID) 20 MG tablet Take 1 tablet (20 mg total) by mouth 2 (two) times daily.    PHQ 2/9 Scores 07/21/2019 04/27/2019 02/20/2019 10/28/2018  PHQ - 2 Score 0 0 0 0  PHQ- 9 Score 0 - - -    BP Readings from Last 3 Encounters:  07/21/19 118/70  06/23/19 134/66  02/20/19 118/78    Physical Exam Vitals and nursing note reviewed.  Constitutional:      General: He is not in acute distress.    Appearance: He is well-developed.  HENT:     Head: Normocephalic and atraumatic.  Pulmonary:     Effort: Pulmonary effort is normal. No respiratory distress.  Musculoskeletal:        General: Normal range of motion.  Skin:    General: Skin is warm and dry.     Findings: No rash.  Neurological:     Mental Status: He is alert and oriented to person, place, and time.   Psychiatric:        Behavior: Behavior normal.        Thought Content: Thought content normal.     Wt Readings from Last 3 Encounters:  07/21/19 202 lb (91.6 kg)  06/23/19 200 lb (90.7 kg)  04/27/19 205 lb (93 kg)    BP 118/70   Pulse 76   Ht 6' (1.829 m)   Wt 202 lb (91.6 kg)   SpO2 96%   BMI 27.40 kg/m   Assessment and Plan: 1. Gastroesophageal reflux disease, unspecified whether esophagitis present Not much improved with Famotidine.  Will check H Pylori and CBC.   Start Protonix 40 mg daily. When H Pylori test returns will treat if positive; if negative will order abdominal US to rule out gall bladder disease If the treatment is ineffective or the work up negative, will need to refer to GI - H. pylori  breath test - CBC with Differential/Platelet - pantoprazole (PROTONIX) 40 MG tablet; Take 1 tablet (40 mg total) by mouth daily.  Dispense: 30 tablet; Refill: 3  2. Coronary artery disease involving native coronary artery of native heart with angina pectoris [facility]) He will see cardiology next week for atypical chest pain radiating into both sides of his neck He continues on dual anti platelet therapy, Nitrates and beta blockers  3. Peripheral vascular disease ([place]) Seen in the past by Vascular surgery Dr. Lucky Cowboy and lower extremity angiography. He is cutting back on his smoking and his alcohol intake as well.   Partially dictated using Editor, commissioning. Any errors are unintentional.  Halina Maidens, MD [facility]  07/21/2019

## 2019-07-22 LAB — CBC WITH DIFFERENTIAL/PLATELET
Basophils Absolute: 0.1 10*3/uL (ref 0.0–0.2)
Basos: 1 %
EOS (ABSOLUTE): 0.2 10*3/uL (ref 0.0–0.4)
Eos: 2 %
Hematocrit: 47.6 % (ref 37.5–51.0)
Hemoglobin: 16.4 g/dL (ref 13.0–17.7)
Immature Grans (Abs): 0 10*3/uL (ref 0.0–0.1)
Immature Granulocytes: 0 %
Lymphocytes Absolute: 3.4 10*3/uL — ABNORMAL HIGH (ref 0.7–3.1)
Lymphs: 40 %
MCH: 32.7 pg (ref 26.6–33.0)
MCHC: 34.5 g/dL (ref 31.5–35.7)
MCV: 95 fL (ref 79–97)
Monocytes Absolute: 0.6 10*3/uL (ref 0.1–0.9)
Monocytes: 7 %
Neutrophils Absolute: 4.4 10*3/uL (ref 1.4–7.0)
Neutrophils: 50 %
Platelets: 222 10*3/uL (ref 150–450)
RBC: 5.02 x10E6/uL (ref 4.14–5.80)
RDW: 12.8 % (ref 11.6–15.4)
WBC: 8.6 10*3/uL (ref 3.4–10.8)

## 2019-07-28 DIAGNOSIS — E785 Hyperlipidemia, unspecified: Secondary | ICD-10-CM | POA: Diagnosis not present

## 2019-07-28 DIAGNOSIS — I2510 Atherosclerotic heart disease of native coronary artery without angina pectoris: Secondary | ICD-10-CM | POA: Diagnosis not present

## 2019-07-28 DIAGNOSIS — F17200 Nicotine dependence, unspecified, uncomplicated: Secondary | ICD-10-CM | POA: Diagnosis not present

## 2019-07-28 DIAGNOSIS — I10 Essential (primary) hypertension: Secondary | ICD-10-CM | POA: Diagnosis not present

## 2019-07-28 DIAGNOSIS — I200 Unstable angina: Secondary | ICD-10-CM | POA: Diagnosis not present

## 2019-07-28 DIAGNOSIS — Z9889 Other specified postprocedural states: Secondary | ICD-10-CM | POA: Diagnosis not present

## 2019-07-30 ENCOUNTER — Telehealth: Payer: Self-pay

## 2019-07-30 DIAGNOSIS — K219 Gastro-esophageal reflux disease without esophagitis: Secondary | ICD-10-CM | POA: Diagnosis not present

## 2019-07-30 NOTE — Telephone Encounter (Signed)
Called and reminded pt to have H Pylori test done. He said he is going to get this done today.

## 2019-07-31 LAB — H. PYLORI BREATH TEST: H pylori Breath Test: NEGATIVE

## 2019-08-04 ENCOUNTER — Other Ambulatory Visit: Payer: Self-pay | Admitting: Internal Medicine

## 2019-08-04 DIAGNOSIS — R1013 Epigastric pain: Secondary | ICD-10-CM

## 2019-08-04 NOTE — Progress Notes (Signed)
Patient informed. Symptoms are still persistent and not much better. Wants to have a ultrasound. Agrees to wait to be scheduled.

## 2019-08-07 ENCOUNTER — Encounter (INDEPENDENT_AMBULATORY_CARE_PROVIDER_SITE_OTHER): Payer: Self-pay

## 2019-08-07 ENCOUNTER — Ambulatory Visit
Admission: RE | Admit: 2019-08-07 | Discharge: 2019-08-07 | Disposition: A | Discharge status: Home / Self Care | Payer: PPO | Source: Ambulatory Visit | Attending: Internal Medicine | Admitting: Internal Medicine

## 2019-08-07 ENCOUNTER — Other Ambulatory Visit: Payer: Self-pay

## 2019-08-07 DIAGNOSIS — H40033 Anatomical narrow angle, bilateral: Secondary | ICD-10-CM | POA: Diagnosis not present

## 2019-08-07 DIAGNOSIS — R1013 Epigastric pain: Secondary | ICD-10-CM | POA: Diagnosis not present

## 2019-08-21 DIAGNOSIS — J42 Unspecified chronic bronchitis: Secondary | ICD-10-CM | POA: Diagnosis not present

## 2019-09-02 DIAGNOSIS — J42 Unspecified chronic bronchitis: Secondary | ICD-10-CM | POA: Diagnosis not present

## 2019-09-14 ENCOUNTER — Encounter: Payer: Self-pay | Admitting: Internal Medicine

## 2019-09-14 ENCOUNTER — Other Ambulatory Visit: Payer: Self-pay

## 2019-09-14 ENCOUNTER — Ambulatory Visit (INDEPENDENT_AMBULATORY_CARE_PROVIDER_SITE_OTHER): Payer: PPO | Admitting: Internal Medicine

## 2019-09-14 VITALS — BP 102/58 | HR 63 | Ht 72.0 in | Wt 205.2 lb

## 2019-09-14 DIAGNOSIS — B354 Tinea corporis: Secondary | ICD-10-CM

## 2019-09-14 MED ORDER — CLOTRIMAZOLE-BETAMETHASONE 1-0.05 % EX CREA: 1.0000 "application " | TOPICAL_CREAM | Freq: Two times a day (BID) | CUTANEOUS | 0 refills | Status: DC

## 2019-09-14 NOTE — Progress Notes (Signed)
Date:  09/14/2019   Name:  Aaron [name]   DOB:  1956-06-25   MRN:  [id number]   Chief Complaint: Rash (both buttlocks x 1 month)  Rash This is a recurrent problem. The problem is unchanged. The affected locations include the right hip, left hip, left buttock and right buttock. The rash is characterized by itchiness, peeling and redness. He was exposed to nothing. Pertinent negatives include no diarrhea, fatigue, fever or shortness of breath. Past treatments include nothing (had similar rash several years ago).    Lab Results  Component Value Date   CREATININE 1.00 06/23/2019   BUN 18 06/23/2019   NA 137 06/23/2019   K 4.4 06/23/2019   CL 100 06/23/2019   CO2 26 06/23/2019   Lab Results  Component Value Date   CHOL 121 02/06/2019   HDL 31 (L) 02/06/2019   LDLCALC 62 02/06/2019   TRIG 141 02/06/2019   CHOLHDL 3.9 02/06/2019   Lab Results  Component Value Date   TSH 3.214 02/06/2019   Lab Results  Component Value Date   HGBA1C 5.6 10/28/2018     Review of Systems  Constitutional: Negative for fatigue and fever.  Respiratory: Negative for shortness of breath.   Gastrointestinal: Negative for diarrhea.  Skin: Positive for rash.    Patient Active Problem List   Diagnosis Date Noted  . Alcoholic intoxication without complication ([place]) 99991111  . Chest pain 02/06/2019  . Mixed hyperlipidemia 10/28/2018  . Syncope 09/16/2017  . Tobacco use disorder 07/13/2016  . Elevated blood sugar 07/27/2015  . Controlled gout 12/22/2014  . Coronary artery disease involving native coronary artery of native heart with angina pectoris ([place]) 10/09/2014  . Essential (primary) hypertension 10/09/2014  . Dupuytren's disease of palm 10/09/2014  . Psoriasis 10/09/2014  . Pain in shoulder 10/09/2014  . H/O cardiac catheterization 01/27/2014  . Peripheral vascular disease ([place]) 01/27/2014    No Known Allergies  Past Surgical History:  Procedure Laterality Date  .  APPENDECTOMY    . cardiac stents   2012  . COLONOSCOPY  03/30/2013   [place]  . LOWER EXTREMITY ANGIOGRAPHY Right 09/25/2018   Procedure: LOWER EXTREMITY ANGIOGRAPHY;  Surgeon: Algernon Huxley, MD;  Location: [facility];  Service: Cardiovascular;  Laterality: Right;    Social History   Tobacco Use  . Smoking status: Current Some Day Smoker    Packs/day: 0.25    Types: Cigars    Start date: 09/03/1976  . Smokeless tobacco: Never Used  . Tobacco comment: chantix  Substance Use Topics  . Alcohol use: Yes    Comment: quit drinking around 04/2019  . Drug use: No     Medication list has been reviewed and updated.  Current Meds  Medication Sig  . albuterol (VENTOLIN HFA) 108 (90 Base) MCG/ACT inhaler Inhale 2 puffs into the lungs every 6 (six) hours as needed for wheezing or shortness of breath.  [name] aspirin 325 MG EC tablet Take 1 tablet by mouth daily.  . cilostazol (PLETAL) 50 MG tablet Take by mouth 2 (two) times daily.   . clopidogrel (PLAVIX) 75 MG tablet Take 1 tablet by mouth daily.  . Fluticasone-Umeclidin-Vilant (TRELEGY ELLIPTA) 100-62.5-25 MCG/INH AEPB Inhale 1 puff into the lungs daily.  . indomethacin (INDOCIN) 25 MG capsule Take 1 capsule (25 mg total) by mouth 2 (two) times daily as needed.  [name] ipratropium-albuterol (DUONEB) 0.5-2.5 (3) MG/3ML SOLN Take 3 mLs by nebulization QID.  [name] isosorbide mononitrate (IMDUR)  60 MG 24 hr tablet Take 90 mg by mouth daily.   [name] lisinopril (PRINIVIL,ZESTRIL) 10 MG tablet Take 1 tablet by mouth daily.  . nitroGLYCERIN (NITROSTAT) 0.4 MG SL tablet Place 1 tablet under the tongue as needed.  . pantoprazole (PROTONIX) 40 MG tablet Take 1 tablet (40 mg total) by mouth daily.  . simvastatin (ZOCOR) 40 MG tablet Take 1 tablet (40 mg total) by mouth at bedtime.    PHQ 2/9 Scores 07/21/2019 04/27/2019 02/20/2019 10/28/2018  PHQ - 2 Score 0 0 0 0  PHQ- 9 Score 0 - - -    BP Readings from Last 3 Encounters:  09/14/19 (!) 102/58    07/21/19 118/70  06/23/19 134/66    Physical Exam Vitals and nursing note reviewed.  Constitutional:      General: He is not in acute distress.    Appearance: He is well-developed.  HENT:     Head: Normocephalic and atraumatic.  Pulmonary:     Effort: Pulmonary effort is normal. No respiratory distress.  Musculoskeletal:        General: Normal range of motion.  Skin:    General: Skin is warm and dry.     Findings: No rash.          Comments: All areas with red base, slight peeling, no vesicles On left hip, lesions are more circular with central clear area  Neurological:     Mental Status: He is alert and oriented to person, place, and time.  Psychiatric:        Behavior: Behavior normal.        Thought Content: Thought content normal.     Wt Readings from Last 3 Encounters:  09/14/19 205 lb 3.2 oz (93.1 kg)  07/21/19 202 lb (91.6 kg)  06/23/19 200 lb (90.7 kg)    BP (!) 102/58   Pulse 63   Ht 6' (1.829 m)   Wt 205 lb 3.2 oz (93.1 kg)   SpO2 97%   BMI 27.83 kg/m   Assessment and Plan: 1. Tinea corporis Use cream twice a day until resolved - then one more week If no improvement, will refer to Dermatology - clotrimazole-betamethasone (LOTRISONE) cream; Apply 1 application topically 2 (two) times daily. To rash on hips and buttocks  Dispense: 45 g; Refill: 0   Partially dictated using Editor, commissioning. Any errors are unintentional.  Halina Maidens, MD [facility]  09/14/2019

## 2019-09-18 DIAGNOSIS — J42 Unspecified chronic bronchitis: Secondary | ICD-10-CM | POA: Diagnosis not present

## 2019-10-19 ENCOUNTER — Telehealth: Payer: Self-pay | Admitting: Internal Medicine

## 2019-10-19 DIAGNOSIS — J42 Unspecified chronic bronchitis: Secondary | ICD-10-CM | POA: Diagnosis not present

## 2019-10-19 NOTE — Telephone Encounter (Signed)
Pt is having side pain with some trouble breathing, been on and off for about three days. He has no other symptoms and has had covid shots already. Wants to come in for possible pneumonia and xray order. Do I set him up to come in?

## 2019-10-19 NOTE — Telephone Encounter (Signed)
Pt going to [facility]

## 2019-10-30 ENCOUNTER — Encounter: Payer: Self-pay | Admitting: Internal Medicine

## 2019-10-30 NOTE — Progress Notes (Deleted)
Date:  10/30/2019   Name:  Aaron [name]   DOB:  05-18-1956   MRN:  [id number]   Chief Complaint: No chief complaint on file. Aaron [name] is a 64 y.o. male who presents today for his Complete Annual Exam. He feels {DESC; WELL/FAIRLY WELL/POORLY:18703}. He reports exercising ***. He reports he is sleeping {DESC; WELL/FAIRLY WELL/POORLY:18703}.   Colonoscopy  2014 - scheduled but cancelled last November  Immunization History  Administered Date(s) Administered  . Influenza,inj,Quad PF,6+ Mos 05/19/2015, 04/06/2016, 04/02/2017, 03/24/2018, 04/01/2019  . Pneumococcal Polysaccharide-23 11/14/2012, 07/03/2014  . Tdap 12/18/2013  . Zoster Recombinat (Shingrix) 06/03/2019    Hypertension This is a chronic problem. The problem is controlled.  Hyperlipidemia  CAD -  Alcohol use -    Lab Results  Component Value Date   CREATININE 1.00 06/23/2019   BUN 18 06/23/2019   NA 137 06/23/2019   K 4.4 06/23/2019   CL 100 06/23/2019   CO2 26 06/23/2019   Lab Results  Component Value Date   CHOL 121 02/06/2019   HDL 31 (L) 02/06/2019   LDLCALC 62 02/06/2019   TRIG 141 02/06/2019   CHOLHDL 3.9 02/06/2019   Lab Results  Component Value Date   TSH 3.214 02/06/2019   Lab Results  Component Value Date   HGBA1C 5.6 10/28/2018   Lab Results  Component Value Date   WBC 8.6 07/21/2019   HGB 16.4 07/21/2019   HCT 47.6 07/21/2019   MCV 95 07/21/2019   PLT 222 07/21/2019   Lab Results  Component Value Date   ALT 51 (H) 06/23/2019   AST 28 06/23/2019   ALKPHOS 58 06/23/2019   BILITOT 0.7 06/23/2019     Review of Systems  Patient Active Problem List   Diagnosis Date Noted  . Alcohol dependence ([place]) 07/21/2019  . Chest pain 02/06/2019  . Mixed hyperlipidemia 10/28/2018  . Syncope 09/16/2017  . Tobacco use disorder 07/13/2016  . Elevated blood sugar 07/27/2015  . Controlled gout 12/22/2014  . Coronary artery disease involving native coronary artery of native heart  with angina pectoris ([place]) 10/09/2014  . Essential (primary) hypertension 10/09/2014  . Dupuytren's disease of palm 10/09/2014  . Psoriasis 10/09/2014  . Pain in shoulder 10/09/2014  . H/O cardiac catheterization 01/27/2014  . Peripheral vascular disease ([place]) 01/27/2014    No Known Allergies  Past Surgical History:  Procedure Laterality Date  . APPENDECTOMY    . cardiac stents   2012  . COLONOSCOPY  03/30/2013   [place]  . LOWER EXTREMITY ANGIOGRAPHY Right 09/25/2018   Procedure: LOWER EXTREMITY ANGIOGRAPHY;  Surgeon: Algernon Huxley, MD;  Location: [facility];  Service: Cardiovascular;  Laterality: Right;    Social History   Tobacco Use  . Smoking status: Current Some Day Smoker    Packs/day: 0.25    Types: Cigars    Start date: 09/03/1976  . Smokeless tobacco: Never Used  . Tobacco comment: chantix  Substance Use Topics  . Alcohol use: Yes    Comment: quit drinking around 04/2019  . Drug use: No     Medication list has been reviewed and updated.  No outpatient medications have been marked as taking for the 10/30/19 encounter (Appointment) with Glean Hess, MD.    [facility] 2/9 Scores 07/21/2019 04/27/2019 02/20/2019 10/28/2018  PHQ - 2 Score 0 0 0 0  PHQ- 9 Score 0 - - -    BP Readings from Last 3 Encounters:  09/14/19 (!) 102/58  07/21/19 118/70  06/23/19 134/66    Physical Exam  Wt Readings from Last 3 Encounters:  09/14/19 205 lb 3.2 oz (93.1 kg)  07/21/19 202 lb (91.6 kg)  06/23/19 200 lb (90.7 kg)    There were no vitals taken for this visit.  Assessment and Plan:

## 2019-11-05 ENCOUNTER — Ambulatory Visit (INDEPENDENT_AMBULATORY_CARE_PROVIDER_SITE_OTHER): Payer: PPO | Admitting: Internal Medicine

## 2019-11-05 ENCOUNTER — Encounter: Payer: Self-pay | Admitting: Internal Medicine

## 2019-11-05 ENCOUNTER — Other Ambulatory Visit: Payer: Self-pay

## 2019-11-05 VITALS — BP 118/64 | HR 63 | Temp 97.7°F | Ht 72.0 in | Wt 196.0 lb

## 2019-11-05 DIAGNOSIS — I739 Peripheral vascular disease, unspecified: Secondary | ICD-10-CM | POA: Diagnosis not present

## 2019-11-05 DIAGNOSIS — I10 Essential (primary) hypertension: Secondary | ICD-10-CM | POA: Diagnosis not present

## 2019-11-05 DIAGNOSIS — I25119 Atherosclerotic heart disease of native coronary artery with unspecified angina pectoris: Secondary | ICD-10-CM

## 2019-11-05 DIAGNOSIS — M109 Gout, unspecified: Secondary | ICD-10-CM | POA: Diagnosis not present

## 2019-11-05 DIAGNOSIS — E782 Mixed hyperlipidemia: Secondary | ICD-10-CM | POA: Diagnosis not present

## 2019-11-05 DIAGNOSIS — Z125 Encounter for screening for malignant neoplasm of prostate: Secondary | ICD-10-CM

## 2019-11-05 DIAGNOSIS — Z0000 Encounter for general adult medical examination without abnormal findings: Secondary | ICD-10-CM | POA: Diagnosis not present

## 2019-11-05 DIAGNOSIS — R739 Hyperglycemia, unspecified: Secondary | ICD-10-CM | POA: Diagnosis not present

## 2019-11-05 DIAGNOSIS — J019 Acute sinusitis, unspecified: Secondary | ICD-10-CM | POA: Diagnosis not present

## 2019-11-05 DIAGNOSIS — D485 Neoplasm of uncertain behavior of skin: Secondary | ICD-10-CM

## 2019-11-05 DIAGNOSIS — F1020 Alcohol dependence, uncomplicated: Secondary | ICD-10-CM | POA: Diagnosis not present

## 2019-11-05 DIAGNOSIS — Z1211 Encounter for screening for malignant neoplasm of colon: Secondary | ICD-10-CM

## 2019-11-05 DIAGNOSIS — F17200 Nicotine dependence, unspecified, uncomplicated: Secondary | ICD-10-CM | POA: Diagnosis not present

## 2019-11-05 LAB — POCT URINALYSIS DIPSTICK
Bilirubin, UA: NEGATIVE
Blood, UA: NEGATIVE
Glucose, UA: NEGATIVE
Ketones, UA: NEGATIVE
Leukocytes, UA: NEGATIVE
Nitrite, UA: NEGATIVE
Protein, UA: NEGATIVE
Spec Grav, UA: 1.02 (ref 1.010–1.025)
Urobilinogen, UA: 0.2 E.U./dL
pH, UA: 6 (ref 5.0–8.0)

## 2019-11-05 MED ORDER — AZITHROMYCIN 250 MG PO TABS: ORAL_TABLET | ORAL | 0 refills | Status: AC

## 2019-11-05 MED ORDER — INDOMETHACIN 25 MG PO CAPS: 25.0000 mg | ORAL_CAPSULE | Freq: Two times a day (BID) | ORAL | 1 refills | Status: DC | PRN

## 2019-11-05 NOTE — Progress Notes (Signed)
Date:  11/05/2019   Name:  Aaron [name]   DOB:  1956/03/10   MRN:  [id number]   Chief Complaint: Annual Exam Aaron [name] is a 64 y.o. male who presents today for his Complete Annual Exam. He feels fairly well. He reports exercising walking the dog. He reports he is sleeping fairly well.  He continues to smoke cigars about 4 per day Alcohol intake - down to 2 beers once a month  Colonoscopy  03/2013 overdue for 5 yr follow up Immunization History  Administered Date(s) Administered  . Influenza,inj,Quad PF,6+ Mos 05/19/2015, 04/06/2016, 04/02/2017, 03/24/2018, 04/01/2019  . PFIZER SARS-COV-2 Vaccination 08/25/2019, 09/15/2019  . Pneumococcal Polysaccharide-23 11/14/2012, 07/03/2014  . Tdap 12/18/2013  . Zoster Recombinat (Shingrix) 06/03/2019    Hypertension This is a chronic problem. The problem is controlled. Associated symptoms include chest pain and shortness of breath. Pertinent negatives include no headaches or palpitations. Past treatments include ACE inhibitors, beta blockers and direct vasodilators. The current treatment provides significant improvement. Hypertensive end-organ damage includes CAD/MI.  Diabetes He presents for his follow-up diabetic visit. Diabetes type: prediabetes. Pertinent negatives for hypoglycemia include no dizziness or headaches. Associated symptoms include chest pain. Pertinent negatives for diabetes include no weakness. His weight is stable. An ACE inhibitor/angiotensin II receptor blocker is being taken.  Hyperlipidemia This is a chronic problem. Associated symptoms include chest pain and shortness of breath. Current antihyperlipidemic treatment includes statins. The current treatment provides significant improvement of lipids.  Sinus Problem This is a new problem. The current episode started 1 to 4 weeks ago. There has been no fever. He is experiencing no pain. Associated symptoms include congestion (X 1 month- blowing out blood this morning  from nose), coughing (Dry cough. ), shortness of breath and sinus pressure. Pertinent negatives include no chills, headaches or sneezing.  CAD - s/p stent 2013; Cath done 2016 followed by cardiology. He has atypical chest pain, mainly when inactive; he has not used NTG.  He had a stress test in the past 1-2 years but plans to followup with Cardiology in the next few weeks.  PVD - seen by Dr. Lucky Cowboy.  Angiography done last year with angioplasty and stent to the left leg.  He is able to walk the dog three times a day without pain or heaviness.  He is on Plavix and Pletal plus aspirin.  COPD grade2 - seen by Pulmonary at [facility].  PFTs below.  Started on [employer].  However, he has had some pain in the sides of his chest and when he stopped it, the pain improved.  Stopped about 10 days.  SPIROMETRY: FVC was 3.37 liters, 74% of predicted/Post 3.31, 73%, -2% Change FEV1 was 2.40, 66% of predicted/Post 2.18, 60%, -9% Change FEV1 ratio was 71/Post 66 FEF 25-75% liters per second was 43% of predicted/Post 35%, -20% Change SMALL VOLUME NEBULIZER given with 2.5 mg Albuterol for Post Spirometry  LUNG VOLUMES: TLC was 66% of predicted RV was 46% of predicted  DIFFUSION CAPACITY: DLCO was 76% of predicted DLCO/VA was 102% of predicted  FLOW VOLUME LOOP: Scooping of exp limb consistent with obstructive physiology   Impression  Moderate Obstructive ventilatory defect with no BD response and decrased DLCO consistent with Grade2 COPD.    Lab Results  Component Value Date   CREATININE 1.00 06/23/2019   BUN 18 06/23/2019   NA 137 06/23/2019   K 4.4 06/23/2019   CL 100 06/23/2019   CO2 26 06/23/2019   Lab Results  Component Value Date   CHOL 121 02/06/2019   HDL 31 (L) 02/06/2019   LDLCALC 62 02/06/2019   TRIG 141 02/06/2019   CHOLHDL 3.9 02/06/2019   Lab Results  Component Value Date   TSH 3.214 02/06/2019   Lab Results  Component Value Date   HGBA1C 5.6 10/28/2018   Lab Results  Component  Value Date   WBC 8.6 07/21/2019   HGB 16.4 07/21/2019   HCT 47.6 07/21/2019   MCV 95 07/21/2019   PLT 222 07/21/2019   Lab Results  Component Value Date   ALT 51 (H) 06/23/2019   AST 28 06/23/2019   ALKPHOS 58 06/23/2019   BILITOT 0.7 06/23/2019     Review of Systems  Constitutional: Positive for unexpected weight change (Has lost 9 lbs since last month and not trying,). Negative for chills and fever.  HENT: Positive for congestion (X 1 month- blowing out blood this morning from nose), postnasal drip and sinus pressure. Negative for sneezing.   Eyes: Negative for visual disturbance.  Respiratory: Positive for cough (Dry cough. ), chest tightness (X 2 weeks - has appt with cardiology May 11th) and shortness of breath.   Cardiovascular: Positive for chest pain. Negative for palpitations and leg swelling.  Gastrointestinal: Negative for abdominal pain, blood in stool, constipation and diarrhea.       Jerrye Bushy - taking rolaids  Genitourinary: Negative for difficulty urinating and hematuria.  Allergic/Immunologic: Negative for environmental allergies.  Neurological: Negative for dizziness, syncope, weakness, light-headedness and headaches.    Patient Active Problem List   Diagnosis Date Noted  . Alcohol dependence ([place]) 07/21/2019  . Chest pain 02/06/2019  . Mixed hyperlipidemia 10/28/2018  . Tobacco use disorder 07/13/2016  . Elevated blood sugar 07/27/2015  . Controlled gout 12/22/2014  . Coronary artery disease involving native coronary artery of native heart with angina pectoris ([place]) 10/09/2014  . Essential (primary) hypertension 10/09/2014  . Dupuytren's disease of palm 10/09/2014  . Psoriasis 10/09/2014  . Pain in shoulder 10/09/2014  . H/O cardiac catheterization 01/27/2014  . Peripheral vascular disease ([place]) 01/27/2014    No Known Allergies  Past Surgical History:  Procedure Laterality Date  . APPENDECTOMY    . cardiac stents   2012  . COLONOSCOPY  03/30/2013    [place]  . LOWER EXTREMITY ANGIOGRAPHY Right 09/25/2018   Procedure: LOWER EXTREMITY ANGIOGRAPHY;  Surgeon: Algernon Huxley, MD;  Location: [facility];  Service: Cardiovascular;  Laterality: Right;    Social History   Tobacco Use  . Smoking status: Current Some Day Smoker    Packs/day: 0.25    Types: Cigars    Start date: 09/03/1976  . Smokeless tobacco: Never Used  . Tobacco comment: chantix  Substance Use Topics  . Alcohol use: Yes    Comment: quit drinking around 04/2019  . Drug use: No     Medication list has been reviewed and updated.  Current Meds  Medication Sig  . albuterol (VENTOLIN HFA) 108 (90 Base) MCG/ACT inhaler Inhale 2 puffs into the lungs every 6 (six) hours as needed for wheezing or shortness of breath.  [name] aspirin 325 MG EC tablet Take 1 tablet by mouth daily.  . CHANTIX 0.5 MG tablet Take 0.5 mg by mouth 2 (two) times daily.  . cilostazol (PLETAL) 50 MG tablet Take by mouth 2 (two) times daily.   . clopidogrel (PLAVIX) 75 MG tablet Take 1 tablet by mouth daily.  . clotrimazole-betamethasone (LOTRISONE) cream Apply 1 application topically  2 (two) times daily. To rash on hips and buttocks  . indomethacin (INDOCIN) 25 MG capsule Take 1 capsule (25 mg total) by mouth 2 (two) times daily as needed.  [name] ipratropium-albuterol (DUONEB) 0.5-2.5 (3) MG/3ML SOLN Take 3 mLs by nebulization QID.  [name] isosorbide mononitrate (IMDUR) 60 MG 24 hr tablet Take 90 mg by mouth daily.   [name] lisinopril (PRINIVIL,ZESTRIL) 10 MG tablet Take 1 tablet by mouth daily.  . metoprolol tartrate (LOPRESSOR) 50 MG tablet Take 50-75 mg by mouth 2 (two) times a day. Take 50MG  by mouth every morning and 75MG  every evening  . nitroGLYCERIN (NITROSTAT) 0.4 MG SL tablet Place 1 tablet under the tongue as needed.  . pantoprazole (PROTONIX) 40 MG tablet Take 1 tablet (40 mg total) by mouth daily.  . simvastatin (ZOCOR) 40 MG tablet Take 1 tablet (40 mg total) by mouth at bedtime.    PHQ 2/9 Scores  11/05/2019 07/21/2019 04/27/2019 02/20/2019  PHQ - 2 Score 0 0 0 0  PHQ- 9 Score 0 0 - -    BP Readings from Last 3 Encounters:  11/05/19 118/64  09/14/19 (!) 102/58  07/21/19 118/70    Physical Exam Vitals and nursing note reviewed.  Constitutional:      Appearance: Normal appearance. He is well-developed.  HENT:     Head: Normocephalic.     Right Ear: Tympanic membrane, ear canal and external ear normal.     Left Ear: Tympanic membrane, ear canal and external ear normal.     Nose: Nose normal.     Right Sinus: No maxillary sinus tenderness or frontal sinus tenderness.     Left Sinus: No maxillary sinus tenderness or frontal sinus tenderness.  Eyes:     Conjunctiva/sclera: Conjunctivae normal.     Pupils: Pupils are equal, round, and reactive to light.  Neck:     Thyroid: No thyromegaly.     Vascular: No carotid bruit.  Cardiovascular:     Rate and Rhythm: Normal rate and regular rhythm.  No extrasystoles are present.    Pulses:          Dorsalis pedis pulses are 0 on the right side and 0 on the left side.       Posterior tibial pulses are 2+ on the right side and 0 on the left side.     Heart sounds: Normal heart sounds. No murmur.  Pulmonary:     Effort: Pulmonary effort is normal.     Breath sounds: Normal breath sounds. No wheezing or rhonchi.  Chest:     Breasts:        Right: No mass.        Left: No mass.  Abdominal:     General: Bowel sounds are normal.     Palpations: Abdomen is soft.     Tenderness: There is no abdominal tenderness.  Musculoskeletal:        General: Normal range of motion.     Cervical back: Normal range of motion and neck supple.     Right lower leg: No edema.     Left lower leg: No edema.  Lymphadenopathy:     Cervical: No cervical adenopathy.  Skin:    General: Skin is warm and dry.     Comments: Raised scaly lesion on left forehead  Neurological:     Mental Status: He is alert and oriented to person, place, and time.     Deep  Tendon Reflexes: Reflexes are normal and symmetric.  Psychiatric:        Attention and Perception: Attention normal.        Mood and Affect: Mood normal.        Speech: Speech normal.        Behavior: Behavior normal.        Thought Content: Thought content normal.     Wt Readings from Last 3 Encounters:  11/05/19 196 lb (88.9 kg)  09/14/19 205 lb 3.2 oz (93.1 kg)  07/21/19 202 lb (91.6 kg)    BP 118/64   Pulse 63   Temp 97.7 F (36.5 C) (Temporal)   Ht 6' (1.829 m)   Wt 196 lb (88.9 kg)   SpO2 97%   BMI 26.58 kg/m   Assessment and Plan: 1. Annual physical exam Normal weight Continue physical activity - POCT urinalysis dipstick  2. Peripheral vascular disease ([place]) Improved since intervention last year Continue anti-platelet agents  3. Essential (primary) hypertension Clinically stable exam with well controlled BP on lisinopril and metoprolol. Tolerating medications without side effects at this time. Pt to continue current regimen and low sodium diet; benefits of regular exercise as able discussed. - CBC with Differential/Platelet - Comprehensive metabolic panel  4. Coronary artery disease involving native coronary artery of native heart with angina pectoris ([place]) Stable; atypical chest pain to be evaluated by Cardiology  5. Controlled gout Continue indocin PRN - Uric acid - indomethacin (INDOCIN) 25 MG capsule; Take 1 capsule (25 mg total) by mouth 2 (two) times daily as needed.  Dispense: 30 capsule; Refill: 1  6. Elevated blood sugar Check labs - Hemoglobin A1c  7. Tobacco use disorder Still smoking a few cigarillos per day  8. Mixed hyperlipidemia Tolerating statin medication without side effects at this time LDL is at goal of < 70 on current dose Continue same therapy without change at this time. - Lipid panel  9. Uncomplicated alcohol dependence ([place]) Pt states that he drinks alcohol rarelynow  10. Colon cancer screening Due for 5 yr  colonoscopy - Ambulatory referral to Gastroenterology  11. Neoplasm of uncertain behavior of skin - Ambulatory referral to Dermatology  12. Prostate cancer screening DRE deferred - PSA  13. Acute non-recurrent sinusitis, unspecified location - azithromycin (ZITHROMAX Z-PAK) 250 MG tablet; UAD  Dispense: 6 each; Refill: 0   Partially dictated using Editor, commissioning. Any errors are unintentional.  Halina Maidens, MD [facility]  11/05/2019

## 2019-11-06 LAB — COMPREHENSIVE METABOLIC PANEL
ALT: 43 IU/L (ref 0–44)
AST: 25 IU/L (ref 0–40)
Albumin/Globulin Ratio: 1.7 (ref 1.2–2.2)
Albumin: 4.7 g/dL (ref 3.8–4.8)
Alkaline Phosphatase: 71 IU/L (ref 39–117)
BUN/Creatinine Ratio: 20 (ref 10–24)
BUN: 22 mg/dL (ref 8–27)
Bilirubin Total: 0.5 mg/dL (ref 0.0–1.2)
CO2: 24 mmol/L (ref 20–29)
Calcium: 9.9 mg/dL (ref 8.6–10.2)
Chloride: 97 mmol/L (ref 96–106)
Creatinine, Ser: 1.1 mg/dL (ref 0.76–1.27)
GFR calc Af Amer: 82 mL/min/{1.73_m2} (ref >59)
GFR calc non Af Amer: 71 mL/min/{1.73_m2} (ref >59)
Globulin, Total: 2.7 g/dL (ref 1.5–4.5)
Glucose: 102 mg/dL — ABNORMAL HIGH (ref 65–99)
Potassium: 4.7 mmol/L (ref 3.5–5.2)
Sodium: 135 mmol/L (ref 134–144)
Total Protein: 7.4 g/dL (ref 6.0–8.5)

## 2019-11-06 LAB — CBC WITH DIFFERENTIAL/PLATELET
Basophils Absolute: 0 10*3/uL (ref 0.0–0.2)
Basos: 1 %
EOS (ABSOLUTE): 0.1 10*3/uL (ref 0.0–0.4)
Eos: 1 %
Hematocrit: 47.4 % (ref 37.5–51.0)
Hemoglobin: 16.6 g/dL (ref 13.0–17.7)
Immature Grans (Abs): 0 10*3/uL (ref 0.0–0.1)
Immature Granulocytes: 0 %
Lymphocytes Absolute: 2.4 10*3/uL (ref 0.7–3.1)
Lymphs: 34 %
MCH: 33.5 pg — ABNORMAL HIGH (ref 26.6–33.0)
MCHC: 35 g/dL (ref 31.5–35.7)
MCV: 96 fL (ref 79–97)
Monocytes Absolute: 0.5 10*3/uL (ref 0.1–0.9)
Monocytes: 8 %
Neutrophils Absolute: 4.1 10*3/uL (ref 1.4–7.0)
Neutrophils: 56 %
Platelets: 236 10*3/uL (ref 150–450)
RBC: 4.96 x10E6/uL (ref 4.14–5.80)
RDW: 12.8 % (ref 11.6–15.4)
WBC: 7.2 10*3/uL (ref 3.4–10.8)

## 2019-11-06 LAB — LIPID PANEL
Chol/HDL Ratio: 3.8 ratio (ref 0.0–5.0)
Cholesterol, Total: 146 mg/dL (ref 100–199)
HDL: 38 mg/dL — ABNORMAL LOW (ref >39)
LDL Chol Calc (NIH): 82 mg/dL (ref 0–99)
Triglycerides: 149 mg/dL (ref 0–149)
VLDL Cholesterol Cal: 26 mg/dL (ref 5–40)

## 2019-11-06 LAB — URIC ACID: Uric Acid: 7.8 mg/dL (ref 3.8–8.4)

## 2019-11-06 LAB — PSA: Prostate Specific Ag, Serum: 2.5 ng/mL (ref 0.0–4.0)

## 2019-11-06 LAB — HEMOGLOBIN A1C
Est. average glucose Bld gHb Est-mCnc: 120 mg/dL
Hgb A1c MFr Bld: 5.8 % — ABNORMAL HIGH (ref 4.8–5.6)

## 2019-11-09 ENCOUNTER — Other Ambulatory Visit: Payer: Self-pay

## 2019-11-09 ENCOUNTER — Emergency Department: Payer: PPO

## 2019-11-09 ENCOUNTER — Emergency Department
Admission: EM | Admit: 2019-11-09 | Discharge: 2019-11-09 | Disposition: A | Discharge status: Home / Self Care | Payer: PPO | Attending: Emergency Medicine | Admitting: Emergency Medicine

## 2019-11-09 DIAGNOSIS — F17290 Nicotine dependence, other tobacco product, uncomplicated: Secondary | ICD-10-CM | POA: Diagnosis not present

## 2019-11-09 DIAGNOSIS — Z7982 Long term (current) use of aspirin: Secondary | ICD-10-CM | POA: Insufficient documentation

## 2019-11-09 DIAGNOSIS — R110 Nausea: Secondary | ICD-10-CM | POA: Insufficient documentation

## 2019-11-09 DIAGNOSIS — R0602 Shortness of breath: Secondary | ICD-10-CM | POA: Insufficient documentation

## 2019-11-09 DIAGNOSIS — Z955 Presence of coronary angioplasty implant and graft: Secondary | ICD-10-CM | POA: Diagnosis not present

## 2019-11-09 DIAGNOSIS — R001 Bradycardia, unspecified: Secondary | ICD-10-CM | POA: Diagnosis not present

## 2019-11-09 DIAGNOSIS — R61 Generalized hyperhidrosis: Secondary | ICD-10-CM | POA: Diagnosis not present

## 2019-11-09 DIAGNOSIS — I2510 Atherosclerotic heart disease of native coronary artery without angina pectoris: Secondary | ICD-10-CM | POA: Diagnosis not present

## 2019-11-09 DIAGNOSIS — R079 Chest pain, unspecified: Secondary | ICD-10-CM

## 2019-11-09 DIAGNOSIS — I10 Essential (primary) hypertension: Secondary | ICD-10-CM | POA: Diagnosis not present

## 2019-11-09 DIAGNOSIS — Z79899 Other long term (current) drug therapy: Secondary | ICD-10-CM | POA: Diagnosis not present

## 2019-11-09 DIAGNOSIS — R0789 Other chest pain: Secondary | ICD-10-CM | POA: Diagnosis not present

## 2019-11-09 LAB — TROPONIN I (HIGH SENSITIVITY)
Troponin I (High Sensitivity): 4 ng/L (ref <18)
Troponin I (High Sensitivity): 3 ng/L (ref <18)

## 2019-11-09 LAB — COMPREHENSIVE METABOLIC PANEL
ALT: 40 U/L (ref 0–44)
AST: 31 U/L (ref 15–41)
Albumin: 4.4 g/dL (ref 3.5–5.0)
Alkaline Phosphatase: 52 U/L (ref 38–126)
Anion gap: 11 (ref 5–15)
BUN: 21 mg/dL (ref 8–23)
CO2: 23 mmol/L (ref 22–32)
Calcium: 9.6 mg/dL (ref 8.9–10.3)
Chloride: 98 mmol/L (ref 98–111)
Creatinine, Ser: 1.16 mg/dL (ref 0.61–1.24)
GFR calc Af Amer: >60 mL/min (ref >60)
GFR calc non Af Amer: >60 mL/min (ref >60)
Glucose, Bld: 132 mg/dL — ABNORMAL HIGH (ref 70–99)
Potassium: 4.3 mmol/L (ref 3.5–5.1)
Sodium: 132 mmol/L — ABNORMAL LOW (ref 135–145)
Total Bilirubin: 0.8 mg/dL (ref 0.3–1.2)
Total Protein: 7.8 g/dL (ref 6.5–8.1)

## 2019-11-09 LAB — CBC WITH DIFFERENTIAL/PLATELET
Abs Immature Granulocytes: 0.02 10*3/uL (ref 0.00–0.07)
Basophils Absolute: 0 10*3/uL (ref 0.0–0.1)
Basophils Relative: 1 %
Eosinophils Absolute: 0 10*3/uL (ref 0.0–0.5)
Eosinophils Relative: 1 %
HCT: 45.3 % (ref 39.0–52.0)
Hemoglobin: 16.3 g/dL (ref 13.0–17.0)
Immature Granulocytes: 0 %
Lymphocytes Relative: 28 %
Lymphs Abs: 1.8 10*3/uL (ref 0.7–4.0)
MCH: 33.5 pg (ref 26.0–34.0)
MCHC: 36 g/dL (ref 30.0–36.0)
MCV: 93 fL (ref 80.0–100.0)
Monocytes Absolute: 0.4 10*3/uL (ref 0.1–1.0)
Monocytes Relative: 7 %
Neutro Abs: 4.2 10*3/uL (ref 1.7–7.7)
Neutrophils Relative %: 63 %
Platelets: 208 10*3/uL (ref 150–400)
RBC: 4.87 MIL/uL (ref 4.22–5.81)
RDW: 12.7 % (ref 11.5–15.5)
WBC: 6.6 10*3/uL (ref 4.0–10.5)
nRBC: 0 % (ref 0.0–0.2)

## 2019-11-09 LAB — URINALYSIS, COMPLETE (UACMP) WITH MICROSCOPIC
Bacteria, UA: NONE SEEN
Bilirubin Urine: NEGATIVE
Glucose, UA: NEGATIVE mg/dL
Ketones, ur: NEGATIVE mg/dL
Leukocytes,Ua: NEGATIVE
Nitrite: NEGATIVE
Protein, ur: NEGATIVE mg/dL
Specific Gravity, Urine: <1.005 — ABNORMAL LOW (ref 1.005–1.030)
Squamous Epithelial / HPF: NONE SEEN (ref 0–5)
WBC, UA: NONE SEEN WBC/hpf (ref 0–5)
pH: 6 (ref 5.0–8.0)

## 2019-11-09 LAB — PROTIME-INR
INR: 1 (ref 0.8–1.2)
Prothrombin Time: 12.5 seconds (ref 11.4–15.2)

## 2019-11-09 LAB — BRAIN NATRIURETIC PEPTIDE: B Natriuretic Peptide: 112 pg/mL — ABNORMAL HIGH (ref 0.0–100.0)

## 2019-11-09 LAB — APTT: aPTT: 30 seconds (ref 24–36)

## 2019-11-09 LAB — LIPASE, BLOOD: Lipase: 42 U/L (ref 11–51)

## 2019-11-09 MED ORDER — IOHEXOL 350 MG/ML SOLN
100.0000 mL | Freq: Once | INTRAVENOUS | Status: AC | PRN
  Administered 2019-11-09: 100 mL via INTRAVENOUS

## 2019-11-09 MED ORDER — DIPHENHYDRAMINE HCL 50 MG/ML IJ SOLN
12.5000 mg | Freq: Once | INTRAMUSCULAR | Status: AC
  Administered 2019-11-09: 12.5 mg via INTRAVENOUS
  Filled 2019-11-09: qty 1

## 2019-11-09 MED ORDER — PANTOPRAZOLE SODIUM 40 MG IV SOLR
40.0000 mg | Freq: Once | INTRAVENOUS | Status: AC
  Administered 2019-11-09: 40 mg via INTRAVENOUS
  Filled 2019-11-09: qty 40

## 2019-11-09 MED ORDER — METHYLPREDNISOLONE SODIUM SUCC 125 MG IJ SOLR
125.0000 mg | Freq: Once | INTRAMUSCULAR | Status: AC
  Administered 2019-11-09: 125 mg via INTRAVENOUS
  Filled 2019-11-09: qty 2

## 2019-11-09 MED ORDER — LIDOCAINE VISCOUS HCL 2 % MT SOLN
15.0000 mL | Freq: Once | OROMUCOSAL | Status: AC
  Administered 2019-11-09: 15 mL via ORAL
  Filled 2019-11-09: qty 15

## 2019-11-09 MED ORDER — MORPHINE SULFATE (PF) 4 MG/ML IV SOLN
6.0000 mg | Freq: Once | INTRAVENOUS | Status: AC
  Administered 2019-11-09: 6 mg via INTRAVENOUS
  Filled 2019-11-09: qty 2

## 2019-11-09 MED ORDER — ONDANSETRON HCL 4 MG/2ML IJ SOLN
4.0000 mg | Freq: Once | INTRAMUSCULAR | Status: AC
  Administered 2019-11-09: 4 mg via INTRAVENOUS
  Filled 2019-11-09: qty 2

## 2019-11-09 MED ORDER — ALUM & MAG HYDROXIDE-SIMETH 200-200-20 MG/5ML PO SUSP
30.0000 mL | Freq: Once | ORAL | Status: AC
  Administered 2019-11-09: 30 mL via ORAL
  Filled 2019-11-09: qty 30

## 2019-11-09 NOTE — ED Notes (Signed)
Pt returned from CT with hives on pt's back reported by CT tech. Pt airway patent.  Dr. Jari Pigg notified. Meds given per EDP order (see MAR).

## 2019-11-09 NOTE — ED Notes (Signed)
Pt stood up OOB, pt states "he felt a little lightheaded". Pt O2 sat decreased from 99-94% when pt stood up without O2. Pt currently off O2, 95% RA.

## 2019-11-09 NOTE — Discharge Instructions (Addendum)
Your cardiac markers and CT scans were negative.  This was reassuring.  I discussed with your cardiologist who felt comfortable with you following up next week in office.  You should return to the ER if you develop worsening symptoms, worsening shortness of breath, worsening chest pain or any other concerns

## 2019-11-09 NOTE — ED Notes (Addendum)
Pt states "it feels like the itching is getting better and the bumps are going down".  Pt visualized by this RN, NAD noted.

## 2019-11-09 NOTE — ED Provider Notes (Signed)
[facility] Emergency Department Provider Note  ____________________________________________   First MD Initiated Contact with Patient 11/09/19 [phone]     (approximate)  I have reviewed the triage vital signs and the nursing notes.   HISTORY  Chief Complaint Chest Pain    HPI Aaron [name] is a 64 y.o. male With coronary disease status post PCI, hypertension, hyperlipidemia, peripheral arterial disease who comes in with chest pain.  Patient stated that he was walking his dog around 630 he had sudden onset of severe chest pain.  The pain was severe, constant, radiated into his back and to his jaw and into his bilateral shoulders.  He took a total of 6 baby aspirin's.  Got 3 nitros without any relief.  Nothing made the pain better, nothing made it worse.  Had some associated diaphoresis, nausea without vomiting, shortness of breath with it.  Patient is on Plavix and has been compliant with it.  Last stent was placed 4 years ago per patient.  He does report having some abdominal pain yesterday in the middle of his abdomen but has since resolved.  Patient was initially hypertensive in the 220s per EMS.  Patient denies any leg swelling, history of PE or blood clots.  No recent surgeries.  He does report having a lot of recent stress he is unsure if that is contributing.   To note on review of records patient had a NM microcardial perfusion 02/2019 with normal LV function and no signs of ischemia.  He was admitted in August for chest pain observation.  Patient was then seen again in December 2020 with negative cardiac markers and was discharged.  He has follow-up with cardiology next week.          Past Medical History:  Diagnosis Date  . Coronary artery disease    s/p PCI  . Fracture of left ankle, closed, initial encounter 10/23/2017  . Glaucoma   . Gout   . Hx of heart artery stent 01/12/2017  . Hypercholesteremia   . Hypertension   . PAD (peripheral artery  disease) ([place])   . Pain in limb 07/13/2016  . Syncope 09/16/2017  . Unstable angina ([place]) 01/12/2017  . Vertigo     Patient Active Problem List   Diagnosis Date Noted  . Alcohol dependence ([place]) 07/21/2019  . Chest pain 02/06/2019  . Mixed hyperlipidemia 10/28/2018  . Tobacco use disorder 07/13/2016  . Elevated blood sugar 07/27/2015  . Controlled gout 12/22/2014  . Coronary artery disease involving native coronary artery of native heart with angina pectoris ([place]) 10/09/2014  . Essential (primary) hypertension 10/09/2014  . Dupuytren's disease of palm 10/09/2014  . Psoriasis 10/09/2014  . Pain in shoulder 10/09/2014  . H/O cardiac catheterization 01/27/2014  . Peripheral vascular disease ([place]) 01/27/2014    Past Surgical History:  Procedure Laterality Date  . APPENDECTOMY    . cardiac stents   2012  . COLONOSCOPY  03/30/2013   [place]  . LOWER EXTREMITY ANGIOGRAPHY Right 09/25/2018   Procedure: LOWER EXTREMITY ANGIOGRAPHY;  Surgeon: Algernon Huxley, MD;  Location: [facility];  Service: Cardiovascular;  Laterality: Right;    Prior to Admission medications   Medication Sig Start Date End Date Taking? Authorizing Provider  albuterol (VENTOLIN HFA) 108 (90 Base) MCG/ACT inhaler Inhale 2 puffs into the lungs every 6 (six) hours as needed for wheezing or shortness of breath. 02/20/19   Glean Hess, MD  aspirin 325 MG EC tablet Take 1  tablet by mouth daily.    [provider]  azithromycin (ZITHROMAX Z-PAK) 250 MG tablet UAD 11/05/19 11/10/19  Glean Hess, MD  CHANTIX 0.5 MG tablet Take 0.5 mg by mouth 2 (two) times daily. 09/24/19   [provider]  cilostazol (PLETAL) 50 MG tablet Take by mouth 2 (two) times daily.  07/15/19   [provider]  clopidogrel (PLAVIX) 75 MG tablet Take 1 tablet by mouth daily.    [provider]  clotrimazole-betamethasone (LOTRISONE) cream Apply 1 application topically 2 (two) times daily. To rash on hips  and buttocks 09/14/19   Glean Hess, MD  Fluticasone-Umeclidin-Vilant (TRELEGY ELLIPTA) 100-62.5-25 MCG/INH AEPB Inhale 1 puff into the lungs daily. 05/04/19   [provider]  indomethacin (INDOCIN) 25 MG capsule Take 1 capsule (25 mg total) by mouth 2 (two) times daily as needed. 11/05/19   Glean Hess, MD  ipratropium-albuterol (DUONEB) 0.5-2.5 (3) MG/3ML SOLN Take 3 mLs by nebulization QID.    [provider]  isosorbide mononitrate (IMDUR) 60 MG 24 hr tablet Take 90 mg by mouth daily.  09/04/16   [provider]  lisinopril (PRINIVIL,ZESTRIL) 10 MG tablet Take 1 tablet by mouth daily.    [provider]  metoprolol tartrate (LOPRESSOR) 50 MG tablet Take 50-75 mg by mouth 2 (two) times a day. Take 50MG  by mouth every morning and 75MG  every evening    [provider]  nitroGLYCERIN (NITROSTAT) 0.4 MG SL tablet Place 1 tablet under the tongue as needed.    [provider]  pantoprazole (PROTONIX) 40 MG tablet Take 1 tablet (40 mg total) by mouth daily. 07/21/19   Glean Hess, MD  simvastatin (ZOCOR) 40 MG tablet Take 1 tablet (40 mg total) by mouth at bedtime. 05/21/15   Glean Hess, MD    Allergies Patient has no known allergies.  Family History  Problem Relation Age of Onset  . Hypertension Mother   . Diabetes Mother   . Heart failure Father        age 60  . Cancer Father   . Ovarian cancer Sister   . Heart disease Brother     Social History Social History   Tobacco Use  . Smoking status: Current Some Day Smoker    Packs/day: 0.25    Types: Cigars    Start date: 09/03/1976  . Smokeless tobacco: Never Used  . Tobacco comment: chantix  Substance Use Topics  . Alcohol use: Yes    Comment: quit drinking around 04/2019  . Drug use: No      Review of Systems Constitutional: No fever/chills, positive diaphoresis Eyes: No visual changes. ENT: No sore throat. Cardiovascular: Positive chest  pain Respiratory: Positive shortness of breath Gastrointestinal: No abdominal pain.  Positive nausea, no vomiting.  No diarrhea.  No constipation. Genitourinary: Negative for dysuria. Musculoskeletal: Negative for back pain. Skin: Negative for rash. Neurological: Negative for headaches, focal weakness or numbness. All other ROS negative ____________________________________________   PHYSICAL EXAM:  VITAL SIGNS: ED Triage Vitals  Enc Vitals Group     BP 11/09/19 0807 (!) 171/87     Pulse Rate 11/09/19 0807 (!) 59     Resp 11/09/19 0807 14     Temp 11/09/19 0807 98.1 F (36.7 C)     Temp Source 11/09/19 0807 Oral     SpO2 11/09/19 0802 100 %     Weight 11/09/19 0809 195 lb 15.8 oz (88.9 kg)  Height 11/09/19 0809 6' (1.829 m)     Head Circumference --      Peak Flow --      Pain Score 11/09/19 0808 9     Pain Loc --      Pain Edu? --      Excl. in [place]? --     Constitutional: Alert and oriented. Well appearing but reports being in 65 of 10 pain.. Eyes: Conjunctivae are normal. EOMI. Head: Atraumatic. Nose: No congestion/rhinnorhea. Mouth/Throat: Mucous membranes are moist.   Neck: No stridor. Trachea Midline. FROM Cardiovascular: Normal rate, regular rhythm. Grossly normal heart sounds.  Good peripheral circulation. Respiratory: Normal respiratory effort.  No retractions. Lungs CTAB. Gastrointestinal: Soft and nontender. No distention. No abdominal bruits.  Musculoskeletal: No lower extremity tenderness nor edema.  No joint effusions. Neurologic:  Normal speech and language. No gross focal neurologic deficits are appreciated.  Skin:  Skin is warm, dry and intact. No rash noted. Psychiatric: Mood and affect are normal. Speech and behavior are normal. GU: Deferred   ____________________________________________   LABS (all labs ordered are listed, but only abnormal results are displayed)  Labs Reviewed  CBC WITH DIFFERENTIAL/PLATELET  COMPREHENSIVE METABOLIC PANEL   LIPASE, BLOOD  BRAIN NATRIURETIC PEPTIDE  PROTIME-INR  APTT  URINALYSIS, COMPLETE (UACMP) WITH MICROSCOPIC  TROPONIN I (HIGH SENSITIVITY)   ____________________________________________   ED ECG REPORT I, Vanessa [place], the attending physician, personally viewed and interpreted this ECG.  EKG is sinus bradycardia rate of 59, does have some minimal ST elevation in the majority of his leads but this looks similar to his prior EKGs.  He is got no ST depression.  Does have a T wave inversion in aVL, otherwise his intervals are normal. ____________________________________________  RADIOLOGY Robert Bellow, personally viewed and evaluated these images (plain radiographs) as part of my medical decision making, as well as reviewing the written report by the radiologist.  ED MD interpretation: Maybe some right atelectasis versus pleural effusion Official radiology report(s): DG Chest Portable 1 View  Result Date: 11/09/2019 CLINICAL DATA:  Shortness of breath and chest pain EXAM: PORTABLE CHEST 1 VIEW COMPARISON:  June 23, 2019 FINDINGS: There is slight atelectasis in the right base with minimal right pleural effusion. Lungs elsewhere are clear. Heart size and pulmonary vascularity are normal. No adenopathy. No pneumothorax. There is degenerative change in the thoracic spine. IMPRESSION: Mild right base atelectasis with rather minimal right pleural effusion. Lungs elsewhere clear. Stable cardiac silhouette. Electronically Signed   By: Lowella Grip III M.D.   On: 11/09/2019 08:37   CT Angio Chest/Abd/Pel for Dissection W and/or Wo Contrast  Result Date: 11/09/2019 CLINICAL DATA:  Shortness of breath and chest pain EXAM: CT ANGIOGRAPHY CHEST, ABDOMEN AND PELVIS TECHNIQUE: Non-contrast CT of the chest was initially obtained. Multidetector CT imaging through the chest, abdomen and pelvis was performed using the standard protocol during bolus administration of intravenous contrast. Multiplanar  reconstructed images and MIPs were obtained and reviewed to evaluate the vascular anatomy. CONTRAST:  118mL OMNIPAQUE IOHEXOL 350 MG/ML SOLN COMPARISON:  06/08/2018 FINDINGS: CTA CHEST FINDINGS Cardiovascular: Preferential opacification of the thoracic aorta. No evidence of thoracic aortic aneurysm or dissection. No intramural hematoma in the aorta on the noncontrast phase. Well opacified pulmonary arteries with no noted filling defects. Normal heart size. No pericardial effusion. Heavily calcified aorta and coronaries. No aortic aneurysm. Mediastinum/Nodes: Negative for adenopathy, edema, or mass. Lungs/Pleura: There is no edema, consolidation, effusion, or pneumothorax. Mild subpleural scarring at  the right base. Musculoskeletal: Extensive spondylosis. No acute or aggressive finding Review of the MIP images confirms the above findings. CTA ABDOMEN AND PELVIS FINDINGS VASCULAR Aorta: Confluent atheromatous wall thickening and calcification. No aneurysm or dissection Celiac: Mild narrowing at the origin. Heavily calcified splenic artery. No branch occlusion or beading SMA: Diffuse atheromatous plaque with up to 50% narrowing proximally. No major branch occlusion or beading Renals: Accessory renal artery on the right. Heavily calcified bilateral renal arteries without proximal flow limiting stenosis. No beading or aneurysm seen. IMA: Patent Inflow: Bilateral common iliac stents that are patent. There is heavy atheromatous plaque bilaterally. No dissection or aneurysm Veins: Some unremarkable in the arterial phase Review of the MIP images confirms the above findings. NON-VASCULAR Hepatobiliary: Probable hepatic steatosis. No focal lesion. Negative gallbladder and common bile duct. Pancreas: Negative Spleen: Negative Adrenals/Urinary Tract: Adrenal glands are unremarkable. Kidneys are unremarkable. Bladder is unremarkable. Stomach/Bowel: Extensive left-sided colonic diverticulosis. No inflammation or obstruction.  Appendectomy Lymphatic: Negative for adenopathy. Reproductive: Negative Other: Fatty bilateral inguinal hernia. Musculoskeletal: No acute or aggressive finding. Review of the MIP images confirms the above findings. Per technologist notes the patient developed hives, which were treated on site. Per report premedication is planned in the future. IMPRESSION: 1. No acute finding including evidence of acute aortic syndrome. 2. Aortic Atherosclerosis (ICD10-I70.0).  Coronary atherosclerosis. 3. Chronic findings are stable from 2019 and described above Electronically Signed   By: Monte Fantasia M.D.   On: 11/09/2019 09:26    ____________________________________________   PROCEDURES  Procedure(s) performed (including Critical Care):  .1-3 Lead EKG Interpretation Performed by: Vanessa [place], MD Authorized by: Vanessa [place], MD     Interpretation: normal     ECG rate:  50-60s   ECG rate assessment: normal     Rhythm: sinus bradycardia     Ectopy: none     Conduction: normal       ____________________________________________   INITIAL IMPRESSION / ASSESSMENT AND PLAN / ED COURSE   Riky Degray Gasaway was evaluated in Emergency Department on 11/09/2019 for the symptoms described in the history of present illness. He was evaluated in the context of the global COVID-19 pandemic, which necessitated consideration that the patient might be at risk for infection with the SARS-CoV-2 virus that causes COVID-19. Institutional protocols and algorithms that pertain to the evaluation of patients at risk for COVID-19 are in a state of rapid change based on information released by regulatory bodies including the CDC and federal and state organizations. These policies and algorithms were followed during the patient's care in the ED.    Most Likely DDx:  -This is most concerning for ACS versus aortic dissection given the pain radiated to his back and patient's significant hypertension.  Consider PE but denies any  risk factors for this and he is not hypoxic nor tachycardic I would have a higher suspicion for dissection over PE.  We will proceed with further work-up and give IV morphine to help with his pain and hypertension.  We will keep patient on the cardiac monitor.   DDx that was also considered d/t potential to cause harm, but was found less likely based on history and physical (as detailed above): -PNA (no fevers, cough but CXR to evaluate) -PNX (reassured with equal b/l breath sounds, CXR to evaluate) -Symptomatic anemia (will get H&H) -Pericarditis no rub on exam, EKG changes or hx to suggest dx -Tamponade (no notable SOB, tachycardic, hypotensive) -Esophageal rupture (no h/o diffuse vomitting/no crepitus)  Patient had a rash to the contrast.  Patient given Benadryl and steroids.  Patient on reassessment multiple times rash is completely resolved.  I did list the contrast as an allergy  Labs are reassuring.  CT scan was negative.  First troponin negative.  Patient reevaluated still having obtained chest pain.  Reviewed patient's prior notes and back in December came in with some chest discomfort that was relieved with GI cocktail and PPI.  Will trial this while waiting second troponin.  We discussed admission versus going home if second troponin looks good.  Patient feels comfortable either way I would like to see what the second troponin shows  Reevaluated patient and he endorses feeling much better.  No longer any chest pain.  He did have a palpitation but I reviewed his cardiac monitoring and there is no signs of any arrhythmia.  Patient second troponin is negative.  Discussed again admission with patient but he states that if his cardiologist feels like it is okay for him to go home that he would feel comfortable with that given patient's high risk for chest pain will discuss with Dr. Saralyn Pilar who his cardiologist who is on-call today  Discussed with patient's cardiologist Dr. Saralyn Pilar who is  familiar with patient who is had some atypical chest pain in the past.  He states that he feels comfortable with patient going home and can follow-up next week in the office with him.  At this time patient is chest pain-free and he feels comfortable with this plan.  He understands that if his symptoms are returning or he has any other concerns he should return to the ER immediately       ____________________________________________   FINAL CLINICAL IMPRESSION(S) / ED DIAGNOSES   Final diagnoses:  Chest pain, unspecified type     MEDICATIONS GIVEN DURING THIS VISIT:  Medications  morphine 4 MG/ML injection 6 mg (6 mg Intravenous Given 11/09/19 0825)  ondansetron (ZOFRAN) injection 4 mg (4 mg Intravenous Given 11/09/19 0825)  iohexol (OMNIPAQUE) 350 MG/ML injection 100 mL (100 mLs Intravenous Contrast Given 11/09/19 0900)  diphenhydrAMINE (BENADRYL) injection 12.5 mg (12.5 mg Intravenous Given 11/09/19 0918)  methylPREDNISolone sodium succinate (SOLU-MEDROL) 125 mg/2 mL injection 125 mg (125 mg Intravenous Given 11/09/19 0916)  alum & mag hydroxide-simeth (MAALOX/MYLANTA) 200-200-20 MG/5ML suspension 30 mL (30 mLs Oral Given 11/09/19 1043)    And  lidocaine (XYLOCAINE) 2 % viscous mouth solution 15 mL (15 mLs Oral Given 11/09/19 1043)  pantoprazole (PROTONIX) injection 40 mg (40 mg Intravenous Given 11/09/19 1044)     ED Discharge Orders    None       Note:  This document was prepared using Dragon voice recognition software and may include unintentional dictation errors.   Vanessa , MD 11/09/19 1239

## 2019-11-09 NOTE — ED Triage Notes (Signed)
Pt from home via EMS. Pt c/o SHOB and CP. Pt received nitro x3 without relief. Pt c/o CP radiating into jaw, and L shoulder. Pt stated he took 468mg  aspirin at home.  Pt reports hx of 4 stents.

## 2019-11-12 ENCOUNTER — Telehealth: Payer: PPO

## 2019-11-13 ENCOUNTER — Ambulatory Visit: Payer: PPO | Admitting: Internal Medicine

## 2019-11-17 DIAGNOSIS — I25118 Atherosclerotic heart disease of native coronary artery with other forms of angina pectoris: Secondary | ICD-10-CM | POA: Diagnosis not present

## 2019-11-17 DIAGNOSIS — I10 Essential (primary) hypertension: Secondary | ICD-10-CM | POA: Diagnosis not present

## 2019-11-17 DIAGNOSIS — E785 Hyperlipidemia, unspecified: Secondary | ICD-10-CM | POA: Diagnosis not present

## 2019-11-17 DIAGNOSIS — F17200 Nicotine dependence, unspecified, uncomplicated: Secondary | ICD-10-CM | POA: Diagnosis not present

## 2019-11-18 ENCOUNTER — Telehealth (INDEPENDENT_AMBULATORY_CARE_PROVIDER_SITE_OTHER): Payer: Self-pay | Admitting: Gastroenterology

## 2019-11-18 DIAGNOSIS — Z86010 Personal history of colonic polyps: Secondary | ICD-10-CM

## 2019-11-18 DIAGNOSIS — J42 Unspecified chronic bronchitis: Secondary | ICD-10-CM | POA: Diagnosis not present

## 2019-11-18 NOTE — Progress Notes (Signed)
Gastroenterology Pre-Procedure Review  Request Date: Thursday 12/10/19 Requesting Physician: Dr. Allen Norris  PATIENT REVIEW QUESTIONS: The patient responded to the following health history questions as indicated:    1. Are you having any GI issues? yes (gas occasionally) 2. Do you have a personal history of Polyps? yes (6 years ago) 3. Do you have a family history of Colon Cancer or Polyps? no 4. Diabetes Mellitus? no 5. Joint replacements in the past 12 months?no 6. Major health problems in the past 3 months?yes (yes (ER Visit 11/09/19 Chest Pain.  Cardiac Clearance and Blood Thinner Request to be sent to Dr. Saralyn Pilar)) 7. Any artificial heart valves, MVP, or defibrillator?no    MEDICATIONS & ALLERGIES:    Patient reports the following regarding taking any anticoagulation/antiplatelet therapy:   Plavix, Coumadin, Eliquis, Xarelto, Lovenox, Pradaxa, Brilinta, or Effient? yes (Plavix, Celostazol Blood Thinner Request to be sent to Dr. Josefa Half) Aspirin? yes ((2) 81 mg Aspirins in the AM)  Patient confirms/reports the following medications:  Current Outpatient Medications  Medication Sig Dispense Refill  . CHANTIX 0.5 MG tablet Take 0.5 mg by mouth 2 (two) times daily.    . cilostazol (PLETAL) 50 MG tablet Take by mouth 2 (two) times daily.     . clopidogrel (PLAVIX) 75 MG tablet Take 1 tablet by mouth daily.    . indomethacin (INDOCIN) 25 MG capsule Take 1 capsule (25 mg total) by mouth 2 (two) times daily as needed. 30 capsule 1  . isosorbide mononitrate (IMDUR) 120 MG 24 hr tablet Take by mouth.    [name] lisinopril (PRINIVIL,ZESTRIL) 10 MG tablet Take 1 tablet by mouth daily.    . metoprolol tartrate (LOPRESSOR) 50 MG tablet Take 50-75 mg by mouth 2 (two) times a day. Take 50MG  by mouth every morning and 75MG  every evening    . nitroGLYCERIN (NITROSTAT) 0.4 MG SL tablet Place 1 tablet under the tongue as needed.    . pantoprazole (PROTONIX) 40 MG tablet Take 1 tablet (40 mg total) by mouth  daily. 30 tablet 3  . simvastatin (ZOCOR) 40 MG tablet Take 1 tablet (40 mg total) by mouth at bedtime. 30 tablet 5  . albuterol (VENTOLIN HFA) 108 (90 Base) MCG/ACT inhaler Inhale 2 puffs into the lungs every 6 (six) hours as needed for wheezing or shortness of breath. (Patient not taking: Reported on 11/18/2019) 6.7 g 5  . aspirin 325 MG EC tablet Take 1 tablet by mouth daily.    . clotrimazole-betamethasone (LOTRISONE) cream Apply 1 application topically 2 (two) times daily. To rash on hips and buttocks (Patient not taking: Reported on 11/18/2019) 45 g 0  . Fluticasone-Umeclidin-Vilant (TRELEGY ELLIPTA) 100-62.5-25 MCG/INH AEPB Inhale 1 puff into the lungs daily.    [name] ipratropium-albuterol (DUONEB) 0.5-2.5 (3) MG/3ML SOLN Take 3 mLs by nebulization QID.     No current facility-administered medications for this visit.    Patient confirms/reports the following allergies:  Allergies  Allergen Reactions  . Contrast Media [Iodinated Diagnostic Agents] Hives  . Iodine Hives    Orders Placed This Encounter  Procedures  . Procedural/ Surgical Case Request: COLONOSCOPY WITH PROPOFOL    Standing Status:   Standing    Number of Occurrences:   1    Order Specific Question:   Pre-op diagnosis    Answer:   personal history of colon polyps    Order Specific Question:   CPT Code    Answer:   [id number]    AUTHORIZATION INFORMATION Primary Insurance: 1D#: Group #:  Secondary Insurance: 1D#: Group #:  SCHEDULE INFORMATION: Date: 12/10/19 Time: Location:[facility]

## 2019-11-19 ENCOUNTER — Telehealth: Payer: Self-pay

## 2019-11-19 NOTE — Telephone Encounter (Signed)
Patient has been advised per Dr. Lorinda Creed Blood Thinner Advice to stop Plavix and Aspirin 5 days prior to procedure and to resume as soon as possible at the discretion of the surgeon.  He advised me to discuss the other blood thinner Celostazol with patients vascular doctor because the vascular doctor prescribed it.  Additional blood thinner request sent to Dr. Lucky Cowboy in Vascular to see when patient needs to stop Celostazol.  Thanks,  [place], [place]

## 2019-11-24 ENCOUNTER — Telehealth: Payer: Self-pay

## 2019-11-24 NOTE — Telephone Encounter (Signed)
Patient contacted the office to get clarification on his COVID test date.  Typo was made and his colonoscopy date and COVID test date were the same.  Apologized for the error and advised patient of the correct COVID test date of Tuesday 06/01 arrive between 8am-1pm.  He also inquired on the Celostazol blood thinner advice that was sent to Dr. Bunnie Domino office.  I tried to contact them but was not able to speak to anyone directly.  I resent the blood thinner request to Dr. Bunnie Domino office.  Thanks,  [place], [place]

## 2019-11-25 ENCOUNTER — Telehealth: Payer: Self-pay

## 2019-11-25 NOTE — Telephone Encounter (Signed)
Patient has been advised per Dr. Lucky Cowboy to stop Celostazol 5 days prior to his colonoscopy and restart 1 day after procedure.  Thanks,  [place], [place]

## 2019-12-01 ENCOUNTER — Other Ambulatory Visit: Payer: Self-pay

## 2019-12-01 ENCOUNTER — Encounter: Payer: Self-pay | Admitting: Gastroenterology

## 2019-12-01 DIAGNOSIS — Z86010 Personal history of colonic polyps: Secondary | ICD-10-CM

## 2019-12-01 MED ORDER — NA SULFATE-K SULFATE-MG SULF 17.5-3.13-1.6 GM/177ML PO SOLN: 1.0000 | Freq: Once | ORAL | 0 refills | Status: AC

## 2019-12-03 ENCOUNTER — Other Ambulatory Visit: Payer: Self-pay

## 2019-12-03 DIAGNOSIS — L821 Other seborrheic keratosis: Secondary | ICD-10-CM | POA: Diagnosis not present

## 2019-12-03 DIAGNOSIS — Z86010 Personal history of colonic polyps: Secondary | ICD-10-CM

## 2019-12-03 DIAGNOSIS — L720 Epidermal cyst: Secondary | ICD-10-CM | POA: Diagnosis not present

## 2019-12-03 DIAGNOSIS — L570 Actinic keratosis: Secondary | ICD-10-CM | POA: Diagnosis not present

## 2019-12-03 MED ORDER — NA SULFATE-K SULFATE-MG SULF 17.5-3.13-1.6 GM/177ML PO SOLN: 1.0000 | Freq: Once | ORAL | 0 refills | Status: AC

## 2019-12-04 ENCOUNTER — Other Ambulatory Visit: Payer: Self-pay

## 2019-12-04 MED ORDER — PEG 3350-KCL-NABCB-NACL-NASULF 236 G PO SOLR: 4000.0000 mL | Freq: Once | ORAL | 0 refills | Status: AC

## 2019-12-08 ENCOUNTER — Other Ambulatory Visit: Payer: Self-pay

## 2019-12-08 ENCOUNTER — Other Ambulatory Visit
Admission: RE | Admit: 2019-12-08 | Discharge: 2019-12-08 | Disposition: A | Discharge status: Home / Self Care | Payer: PPO | Source: Ambulatory Visit | Attending: Gastroenterology | Admitting: Gastroenterology

## 2019-12-08 DIAGNOSIS — Z20822 Contact with and (suspected) exposure to covid-19: Secondary | ICD-10-CM | POA: Insufficient documentation

## 2019-12-08 DIAGNOSIS — Z01812 Encounter for preprocedural laboratory examination: Secondary | ICD-10-CM | POA: Insufficient documentation

## 2019-12-08 LAB — SARS CORONAVIRUS 2 (TAT 6-24 HRS): SARS Coronavirus 2: NEGATIVE

## 2019-12-09 NOTE — Discharge Instructions (Signed)
General Anesthesia, Adult, Care After This sheet gives you information about how to care for yourself after your procedure. Your health care provider may also give you more specific instructions. If you have problems or questions, contact your health care provider. What can I expect after the procedure? After the procedure, the following side effects are common:  Pain or discomfort at the IV site.  Nausea.  Vomiting.  Sore throat.  Trouble concentrating.  Feeling cold or chills.  Weak or tired.  Sleepiness and fatigue.  Soreness and body aches. These side effects can affect parts of the body that were not involved in surgery. Follow these instructions at home:  For at least 24 hours after the procedure:  Have a responsible adult stay with you. It is important to have someone help care for you until you are awake and alert.  Rest as needed.  Do not: ? Participate in activities in which you could fall or become injured. ? Drive. ? Use heavy machinery. ? Drink alcohol. ? Take sleeping pills or medicines that cause drowsiness. ? Make important decisions or sign legal documents. ? Take care of children on your own. Eating and drinking  Follow any instructions from your health care provider about eating or drinking restrictions.  When you feel hungry, start by eating small amounts of foods that are soft and easy to digest (bland), such as toast. Gradually return to your regular diet.  Drink enough fluid to keep your urine pale yellow.  If you vomit, rehydrate by drinking water, juice, or clear broth. General instructions  If you have sleep apnea, surgery and certain medicines can increase your risk for breathing problems. Follow instructions from your health care provider about wearing your sleep device: ? Anytime you are sleeping, including during daytime naps. ? While taking prescription pain medicines, sleeping medicines, or medicines that make you drowsy.  Return to  your normal activities as told by your health care provider. Ask your health care provider what activities are safe for you.  Take over-the-counter and prescription medicines only as told by your health care provider.  If you smoke, do not smoke without supervision.  Keep all follow-up visits as told by your health care provider. This is important. Contact a health care provider if:  You have nausea or vomiting that does not get better with medicine.  You cannot eat or drink without vomiting.  You have pain that does not get better with medicine.  You are unable to pass urine.  You develop a skin rash.  You have a fever.  You have redness around your IV site that gets worse. Get help right away if:  You have difficulty breathing.  You have chest pain.  You have blood in your urine or stool, or you vomit blood. Summary  After the procedure, it is common to have a sore throat or nausea. It is also common to feel tired.  Have a responsible adult stay with you for the first 24 hours after general anesthesia. It is important to have someone help care for you until you are awake and alert.  When you feel hungry, start by eating small amounts of foods that are soft and easy to digest (bland), such as toast. Gradually return to your regular diet.  Drink enough fluid to keep your urine pale yellow.  Return to your normal activities as told by your health care provider. Ask your health care provider what activities are safe for you. This information is not   intended to replace advice given to you by your health care provider. Make sure you discuss any questions you have with your health care provider. Document Revised: 06/28/2017 Document Reviewed: 02/08/2017 Elsevier Patient Education  2020 Elsevier Inc.  

## 2019-12-10 ENCOUNTER — Encounter: Payer: Self-pay | Admitting: Gastroenterology

## 2019-12-10 ENCOUNTER — Ambulatory Visit: Payer: PPO | Admitting: Anesthesiology

## 2019-12-10 ENCOUNTER — Ambulatory Visit
Admission: RE | Admit: 2019-12-10 | Discharge: 2019-12-10 | Disposition: A | Discharge status: Home / Self Care | Payer: PPO | Attending: Gastroenterology | Admitting: Gastroenterology

## 2019-12-10 ENCOUNTER — Encounter
Admission: RE | Disposition: A | Discharge status: Home / Self Care | Payer: Self-pay | Source: Home / Self Care | Attending: Gastroenterology

## 2019-12-10 ENCOUNTER — Other Ambulatory Visit: Payer: Self-pay

## 2019-12-10 DIAGNOSIS — H409 Unspecified glaucoma: Secondary | ICD-10-CM | POA: Insufficient documentation

## 2019-12-10 DIAGNOSIS — I10 Essential (primary) hypertension: Secondary | ICD-10-CM | POA: Diagnosis not present

## 2019-12-10 DIAGNOSIS — R42 Dizziness and giddiness: Secondary | ICD-10-CM | POA: Diagnosis not present

## 2019-12-10 DIAGNOSIS — I2510 Atherosclerotic heart disease of native coronary artery without angina pectoris: Secondary | ICD-10-CM | POA: Insufficient documentation

## 2019-12-10 DIAGNOSIS — Z1211 Encounter for screening for malignant neoplasm of colon: Secondary | ICD-10-CM | POA: Diagnosis not present

## 2019-12-10 DIAGNOSIS — Z8041 Family history of malignant neoplasm of ovary: Secondary | ICD-10-CM | POA: Diagnosis not present

## 2019-12-10 DIAGNOSIS — Z809 Family history of malignant neoplasm, unspecified: Secondary | ICD-10-CM | POA: Insufficient documentation

## 2019-12-10 DIAGNOSIS — Z79899 Other long term (current) drug therapy: Secondary | ICD-10-CM | POA: Insufficient documentation

## 2019-12-10 DIAGNOSIS — F17290 Nicotine dependence, other tobacco product, uncomplicated: Secondary | ICD-10-CM | POA: Insufficient documentation

## 2019-12-10 DIAGNOSIS — K219 Gastro-esophageal reflux disease without esophagitis: Secondary | ICD-10-CM | POA: Diagnosis not present

## 2019-12-10 DIAGNOSIS — Z91041 Radiographic dye allergy status: Secondary | ICD-10-CM | POA: Insufficient documentation

## 2019-12-10 DIAGNOSIS — M109 Gout, unspecified: Secondary | ICD-10-CM | POA: Diagnosis not present

## 2019-12-10 DIAGNOSIS — J449 Chronic obstructive pulmonary disease, unspecified: Secondary | ICD-10-CM | POA: Diagnosis not present

## 2019-12-10 DIAGNOSIS — Z8249 Family history of ischemic heart disease and other diseases of the circulatory system: Secondary | ICD-10-CM | POA: Diagnosis not present

## 2019-12-10 DIAGNOSIS — F17200 Nicotine dependence, unspecified, uncomplicated: Secondary | ICD-10-CM | POA: Diagnosis not present

## 2019-12-10 DIAGNOSIS — I739 Peripheral vascular disease, unspecified: Secondary | ICD-10-CM | POA: Diagnosis not present

## 2019-12-10 DIAGNOSIS — Z833 Family history of diabetes mellitus: Secondary | ICD-10-CM | POA: Insufficient documentation

## 2019-12-10 DIAGNOSIS — Z86010 Personal history of colonic polyps: Secondary | ICD-10-CM | POA: Insufficient documentation

## 2019-12-10 DIAGNOSIS — E7800 Pure hypercholesterolemia, unspecified: Secondary | ICD-10-CM | POA: Diagnosis not present

## 2019-12-10 DIAGNOSIS — Z7982 Long term (current) use of aspirin: Secondary | ICD-10-CM | POA: Insufficient documentation

## 2019-12-10 DIAGNOSIS — Z955 Presence of coronary angioplasty implant and graft: Secondary | ICD-10-CM | POA: Insufficient documentation

## 2019-12-10 DIAGNOSIS — R0602 Shortness of breath: Secondary | ICD-10-CM | POA: Insufficient documentation

## 2019-12-10 DIAGNOSIS — Z5309 Procedure and treatment not carried out because of other contraindication: Secondary | ICD-10-CM | POA: Diagnosis not present

## 2019-12-10 HISTORY — DX: Gastro-esophageal reflux disease without esophagitis: K21.9

## 2019-12-10 HISTORY — DX: Chronic obstructive pulmonary disease, unspecified: J44.9

## 2019-12-10 HISTORY — PX: COLONOSCOPY WITH PROPOFOL: SHX5780

## 2019-12-10 SURGERY — COLONOSCOPY WITH PROPOFOL
Anesthesia: General | Site: Rectum

## 2019-12-10 MED ORDER — LACTATED RINGERS IV SOLN: INTRAVENOUS | Status: DC

## 2019-12-10 MED ORDER — SODIUM CHLORIDE 0.9 % IV SOLN: INTRAVENOUS | Status: DC

## 2019-12-10 MED ORDER — LIDOCAINE HCL (CARDIAC) PF 100 MG/5ML IV SOSY
PREFILLED_SYRINGE | INTRAVENOUS | Status: DC | PRN
  Administered 2019-12-10: 30 mg via INTRAVENOUS

## 2019-12-10 MED ORDER — PROPOFOL 10 MG/ML IV BOLUS
INTRAVENOUS | Status: DC | PRN
  Administered 2019-12-10: 20 mg via INTRAVENOUS
  Administered 2019-12-10: 40 mg via INTRAVENOUS
  Administered 2019-12-10: 20 mg via INTRAVENOUS
  Administered 2019-12-10: 120 mg via INTRAVENOUS

## 2019-12-10 SURGICAL SUPPLY — 5 items
GOWN CVR UNV OPN BCK APRN NK (MISCELLANEOUS) ×2 IMPLANT
GOWN ISOL THUMB LOOP REG UNIV (MISCELLANEOUS) ×4
KIT ENDO PROCEDURE OLY (KITS) ×3 IMPLANT
MANIFOLD NEPTUNE II (INSTRUMENTS) ×2 IMPLANT
WATER STERILE IRR 250ML POUR (IV SOLUTION) ×3 IMPLANT

## 2019-12-10 NOTE — H&P (Signed)
Aaron Lame, MD [facility] [address] [place], [place] [address] Phone:[phone] Fax : [phone]  Primary Care Physician:  Glean Hess, MD Primary Gastroenterologist:  Dr. Allen Norris  Pre-Procedure History & Physical: HPI:  Aaron [name] is a 64 y.o. male is here for an colonoscopy.   Past Medical History:  Diagnosis Date  . COPD (chronic obstructive pulmonary disease) ([place])   . Coronary artery disease    s/p PCI  . Fracture of left ankle, closed, initial encounter 10/23/2017  . GERD (gastroesophageal reflux disease)   . Glaucoma   . Gout   . Hx of heart artery stent 01/12/2017  . Hypercholesteremia   . Hypertension   . PAD (peripheral artery disease) ([place])   . Pain in limb 07/13/2016  . Syncope 09/16/2017  . Unstable angina ([place]) 01/12/2017  . Vertigo     Past Surgical History:  Procedure Laterality Date  . APPENDECTOMY    . cardiac stents   2012  . COLONOSCOPY  03/30/2013   [place]  . LOWER EXTREMITY ANGIOGRAPHY Right 09/25/2018   Procedure: LOWER EXTREMITY ANGIOGRAPHY;  Surgeon: Algernon Huxley, MD;  Location: [facility];  Service: Cardiovascular;  Laterality: Right;    Prior to Admission medications   Medication Sig Start Date End Date Taking? Authorizing Provider  albuterol (VENTOLIN HFA) 108 (90 Base) MCG/ACT inhaler Inhale 2 puffs into the lungs every 6 (six) hours as needed for wheezing or shortness of breath. 02/20/19  Yes Glean Hess, MD  aspirin 325 MG EC tablet Take 1 tablet by mouth daily.   Yes [provider]  CHANTIX 0.5 MG tablet Take 0.5 mg by mouth 2 (two) times daily. 09/24/19  Yes [provider]  cilostazol (PLETAL) 50 MG tablet Take by mouth 2 (two) times daily.  07/15/19  Yes [provider]  clopidogrel (PLAVIX) 75 MG tablet Take 1 tablet by mouth daily.   Yes [provider]  indomethacin (INDOCIN) 25 MG capsule Take 1 capsule (25 mg total) by mouth 2 (two) times daily as needed. 11/05/19   Yes Glean Hess, MD  isosorbide mononitrate (IMDUR) 120 MG 24 hr tablet Take by mouth. 11/17/19  Yes [provider]  lisinopril (PRINIVIL,ZESTRIL) 10 MG tablet Take 1 tablet by mouth daily.   Yes [provider]  metoprolol tartrate (LOPRESSOR) 50 MG tablet Take 50-75 mg by mouth 2 (two) times a day. Take 50MG  by mouth every morning and 75MG  every evening   Yes [provider]  nitroGLYCERIN (NITROSTAT) 0.4 MG SL tablet Place 1 tablet under the tongue as needed.   Yes [provider]  simvastatin (ZOCOR) 40 MG tablet Take 1 tablet (40 mg total) by mouth at bedtime. 05/21/15  Yes Glean Hess, MD  clotrimazole-betamethasone (LOTRISONE) cream Apply 1 application topically 2 (two) times daily. To rash on hips and buttocks Patient not taking: Reported on 11/18/2019 09/14/19   Glean Hess, MD  Fluticasone-Umeclidin-Vilant (TRELEGY ELLIPTA) 100-62.5-25 MCG/INH AEPB Inhale 1 puff into the lungs daily. 05/04/19   [provider]  ipratropium-albuterol (DUONEB) 0.5-2.5 (3) MG/3ML SOLN Take 3 mLs by nebulization QID.    [provider]  pantoprazole (PROTONIX) 40 MG tablet Take 1 tablet (40 mg total) by mouth daily. Patient not taking: Reported on 12/01/2019 07/21/19   Glean Hess, MD    Allergies as of 11/18/2019 - Review Complete 11/18/2019  Allergen Reaction Noted  . Contrast media [iodinated diagnostic agents] Hives 11/09/2019  .  Iodine Hives 11/09/2019    Family History  Problem Relation Age of Onset  . Hypertension Mother   . Diabetes Mother   . Heart failure Father        age 65  . Cancer Father   . Ovarian cancer Sister   . Heart disease Brother     Social History   Socioeconomic History  . Marital status: Single    Spouse name: Not on file  . Number of children: 0  . Years of education: Not on file  . Highest education level: 12th grade  Occupational History  . Occupation: Retired/Disabled  Tobacco Use    . Smoking status: Current Some Day Smoker    Packs/day: 0.25    Types: Cigars    Start date: 09/03/1976  . Smokeless tobacco: Never Used  . Tobacco comment: chantix   Substance and Sexual Activity  . Alcohol use: Yes    Comment: quit drinking around 04/2019(May have drink 1x/mo)  . Drug use: No  . Sexual activity: Not Currently    Birth control/protection: None  Other Topics Concern  . Not on file  Social History Narrative   Independent at baseline   Social Determinants of Health   Financial Resource Strain:   . Difficulty of Paying Living Expenses:   Food Insecurity:   . Worried About Charity fundraiser in the Last Year:   . Arboriculturist in the Last Year:   Transportation Needs:   . Film/video editor (Medical):   [name] Lack of Transportation (Non-Medical):   Physical Activity:   . Days of Exercise per Week:   . Minutes of Exercise per Session:   Stress:   . Feeling of Stress :   Social Connections:   . Frequency of Communication with Friends and Family:   . Frequency of Social Gatherings with Friends and Family:   . Attends Religious Services:   . Active Member of Clubs or Organizations:   . Attends Archivist Meetings:   [name] Marital Status:   Intimate Partner Violence:   . Fear of Current or Ex-Partner:   . Emotionally Abused:   [name] Physically Abused:   . Sexually Abused:     Review of Systems: See HPI, otherwise negative ROS  Physical Exam: Ht 6' (1.829 m)   Wt 88.9 kg   BMI 26.58 kg/m  General:   Alert,  pleasant and cooperative in NAD Head:  Normocephalic and atraumatic. Neck:  Supple; no masses or thyromegaly. Lungs:  Clear throughout to auscultation.    Heart:  Regular rate and rhythm. Abdomen:  Soft, nontender and nondistended. Normal bowel sounds, without guarding, and without rebound.   Neurologic:  Alert and  oriented x4;  grossly normal neurologically.  Impression/Plan: Aaron [name] is here for an colonoscopy to be performed  for history of adenomatous colon polyps 03/2013  Risks, benefits, limitations, and alternatives regarding  colonoscopy have been reviewed with the patient.  Questions have been answered.  All parties agreeable.   Aaron Lame, MD  12/10/2019, 8:43 AM

## 2019-12-10 NOTE — Anesthesia Procedure Notes (Signed)
Date/Time: 12/10/2019 9:11 AM Performed by: Cameron Ali, CRNA Pre-anesthesia Checklist: Patient identified, Emergency Drugs available, Suction available, Timeout performed and Patient being monitored Patient Re-evaluated:Patient Re-evaluated prior to induction Oxygen Delivery Method: Nasal cannula Placement Confirmation: positive ETCO2

## 2019-12-10 NOTE — Anesthesia Preprocedure Evaluation (Signed)
Anesthesia Evaluation  Patient identified by MRN, date of birth, ID band Patient awake    Reviewed: Allergy & Precautions, H&P , NPO status , Patient's Chart, lab work & pertinent test results, reviewed documented beta blocker date and time   Airway Mallampati: II  TM Distance: >3 FB Neck ROM: full    Dental no notable dental hx.    Pulmonary shortness of breath and with exertion, COPD, Current Smoker,    Pulmonary exam normal breath sounds clear to auscultation       Cardiovascular Exercise Tolerance: Good hypertension, + CAD and + Peripheral Vascular Disease   Rhythm:regular Rate:Normal     Neuro/Psych negative neurological ROS  negative psych ROS   GI/Hepatic Neg liver ROS, GERD  ,  Endo/Other  negative endocrine ROS  Renal/GU negative Renal ROS  negative genitourinary   Musculoskeletal   Abdominal   Peds  Hematology negative hematology ROS (+)   Anesthesia Other Findings   Reproductive/Obstetrics negative OB ROS                             Anesthesia Physical Anesthesia Plan  ASA: III  Anesthesia Plan: General   Post-op Pain Management:    Induction:   PONV Risk Score and Plan: 1 and Propofol infusion  Airway Management Planned:   Additional Equipment:   Intra-op Plan:   Post-operative Plan:   Informed Consent: I have reviewed the patients History and Physical, chart, labs and discussed the procedure including the risks, benefits and alternatives for the proposed anesthesia with the patient or authorized representative who has indicated his/her understanding and acceptance.     Dental Advisory Given  Plan Discussed with: CRNA  Anesthesia Plan Comments:         Anesthesia Quick Evaluation

## 2019-12-10 NOTE — Anesthesia Postprocedure Evaluation (Signed)
Anesthesia Post Note  Patient: Aaron [name]  Procedure(s) Performed: COLONOSCOPY WITH PROPOFOL (N/A Rectum)     Patient location during evaluation: PACU Anesthesia Type: General Level of consciousness: awake and alert Pain management: pain level controlled Vital Signs Assessment: post-procedure vital signs reviewed and stable Respiratory status: spontaneous breathing, nonlabored ventilation, respiratory function stable and patient connected to nasal cannula oxygen Cardiovascular status: blood pressure returned to baseline and stable Postop Assessment: no apparent nausea or vomiting Anesthetic complications: no    Alisa Graff

## 2019-12-10 NOTE — Transfer of Care (Signed)
Immediate Anesthesia Transfer of Care Note  Patient: Aaron [name]  Procedure(s) Performed: COLONOSCOPY WITH PROPOFOL (N/A Rectum)  Patient Location: PACU  Anesthesia Type: General  Level of Consciousness: awake, alert  and patient cooperative  Airway and Oxygen Therapy: Patient Spontanous Breathing and Patient connected to supplemental oxygen  Post-op Assessment: Post-op Vital signs reviewed, Patient's Cardiovascular Status Stable, Respiratory Function Stable, Patent Airway and No signs of Nausea or vomiting  Post-op Vital Signs: Reviewed and stable  Complications: No apparent anesthesia complications

## 2019-12-10 NOTE — Op Note (Signed)
[facility] Gastroenterology Patient Name: Aaron [name] Procedure Date: 12/10/2019 9:03 AM MRN: [id number] Account #: [id number] Date of Birth: September 06, 1955 Admit Type: Outpatient Age: 64 Room: [facility] OR ROOM 01 Gender: Male Note Status: Finalized Procedure:             Colonoscopy Indications:           High risk colon cancer surveillance: Personal history                         of colonic polyps Providers:             [name] MD, MD Referring MD:          Halina Maidens, MD (Referring MD) Medicines:             Propofol per Anesthesia Complications:         No immediate complications. Procedure:             Pre-Anesthesia Assessment:                        - Prior to the procedure, a History and Physical was                         performed, and patient medications and allergies were                         reviewed. The patient's tolerance of previous                         anesthesia was also reviewed. The risks and benefits                         of the procedure and the sedation options and risks                         were discussed with the patient. All questions were                         answered, and informed consent was obtained. Prior                         Anticoagulants: The patient has taken no previous                         anticoagulant or antiplatelet agents. ASA Grade                         Assessment: II - A patient with mild systemic disease.                         After reviewing the risks and benefits, the patient                         was deemed in satisfactory condition to undergo the                         procedure.  After obtaining informed consent, the colonoscope was                         passed under direct vision. Throughout the procedure,                         the patient's blood pressure, pulse, and oxygen                         saturations were monitored continuously. The was                 introduced through the anus with the intention of                         advancing to the cecum. The scope was advanced to the                         sigmoid colon before the procedure was aborted.                         Medications were given. Findings:      The perianal and digital rectal examinations were normal.      A large amount of stool was found in the rectum and in the sigmoid       colon, precluding visualization. Impression:            - Stool in the rectum and in the sigmoid colon.                        - No specimens collected. Recommendation:        - Discharge patient to home.                        - Resume previous diet.                        - Repeat colonoscopy because the bowel preparation was                         poor. Procedure Code(s):     --- Professional ---                        [phone], 51, Colonoscopy, flexible; diagnostic,                         including collection of specimen(s) by brushing or                         washing, when performed (separate procedure) Diagnosis Code(s):     --- Professional ---                        Z86.010, Personal history of colonic polyps CPT copyright 2019 American Medical Association. All rights reserved. The codes documented in this report are preliminary and upon coder review may  be revised to meet current compliance requirements. [name] MD, MD 12/10/2019 9:20:28 AM This report has been signed electronically. Number of Addenda: 0 Note Initiated On: 12/10/2019 9:03 AM Total Procedure Duration: 0 hours 2 minutes 11 seconds  Estimated Blood Loss:  Estimated blood loss: none.      [facility]

## 2019-12-11 ENCOUNTER — Encounter: Payer: Self-pay | Admitting: *Deleted

## 2019-12-19 DIAGNOSIS — J42 Unspecified chronic bronchitis: Secondary | ICD-10-CM | POA: Diagnosis not present

## 2019-12-21 ENCOUNTER — Other Ambulatory Visit: Payer: Self-pay

## 2019-12-21 DIAGNOSIS — Z86010 Personal history of colonic polyps: Secondary | ICD-10-CM

## 2019-12-21 MED ORDER — GAVILYTE-C 240 G PO SOLR: ORAL | 0 refills | Status: DC

## 2019-12-29 DIAGNOSIS — I25118 Atherosclerotic heart disease of native coronary artery with other forms of angina pectoris: Secondary | ICD-10-CM | POA: Diagnosis not present

## 2019-12-29 DIAGNOSIS — I739 Peripheral vascular disease, unspecified: Secondary | ICD-10-CM | POA: Diagnosis not present

## 2019-12-29 DIAGNOSIS — I10 Essential (primary) hypertension: Secondary | ICD-10-CM | POA: Diagnosis not present

## 2019-12-29 DIAGNOSIS — E785 Hyperlipidemia, unspecified: Secondary | ICD-10-CM | POA: Diagnosis not present

## 2019-12-29 DIAGNOSIS — I2510 Atherosclerotic heart disease of native coronary artery without angina pectoris: Secondary | ICD-10-CM | POA: Diagnosis not present

## 2019-12-29 DIAGNOSIS — F17200 Nicotine dependence, unspecified, uncomplicated: Secondary | ICD-10-CM | POA: Diagnosis not present

## 2019-12-29 DIAGNOSIS — Z9889 Other specified postprocedural states: Secondary | ICD-10-CM | POA: Diagnosis not present

## 2020-01-12 ENCOUNTER — Encounter: Payer: Self-pay | Admitting: Gastroenterology

## 2020-01-12 ENCOUNTER — Other Ambulatory Visit: Payer: Self-pay

## 2020-01-14 ENCOUNTER — Other Ambulatory Visit: Payer: Self-pay

## 2020-01-14 ENCOUNTER — Other Ambulatory Visit
Admission: RE | Admit: 2020-01-14 | Discharge: 2020-01-14 | Disposition: A | Discharge status: Home / Self Care | Payer: PPO | Source: Ambulatory Visit | Attending: Gastroenterology | Admitting: Gastroenterology

## 2020-01-14 DIAGNOSIS — Z20822 Contact with and (suspected) exposure to covid-19: Secondary | ICD-10-CM | POA: Diagnosis not present

## 2020-01-14 DIAGNOSIS — Z01812 Encounter for preprocedural laboratory examination: Secondary | ICD-10-CM | POA: Diagnosis not present

## 2020-01-14 LAB — SARS CORONAVIRUS 2 (TAT 6-24 HRS): SARS Coronavirus 2: NEGATIVE

## 2020-01-15 ENCOUNTER — Other Ambulatory Visit (INDEPENDENT_AMBULATORY_CARE_PROVIDER_SITE_OTHER): Payer: Self-pay | Admitting: Vascular Surgery

## 2020-01-15 DIAGNOSIS — I70211 Atherosclerosis of native arteries of extremities with intermittent claudication, right leg: Secondary | ICD-10-CM

## 2020-01-15 DIAGNOSIS — Z95820 Peripheral vascular angioplasty status with implants and grafts: Secondary | ICD-10-CM

## 2020-01-15 NOTE — Discharge Instructions (Signed)
General Anesthesia, Adult, Care After This sheet gives you information about how to care for yourself after your procedure. Your health care provider may also give you more specific instructions. If you have problems or questions, contact your health care provider. What can I expect after the procedure? After the procedure, the following side effects are common:  Pain or discomfort at the IV site.  Nausea.  Vomiting.  Sore throat.  Trouble concentrating.  Feeling cold or chills.  Weak or tired.  Sleepiness and fatigue.  Soreness and body aches. These side effects can affect parts of the body that were not involved in surgery. Follow these instructions at home:  For at least 24 hours after the procedure:  Have a responsible adult stay with you. It is important to have someone help care for you until you are awake and alert.  Rest as needed.  Do not: ? Participate in activities in which you could fall or become injured. ? Drive. ? Use heavy machinery. ? Drink alcohol. ? Take sleeping pills or medicines that cause drowsiness. ? Make important decisions or sign legal documents. ? Take care of children on your own. Eating and drinking  Follow any instructions from your health care provider about eating or drinking restrictions.  When you feel hungry, start by eating small amounts of foods that are soft and easy to digest (bland), such as toast. Gradually return to your regular diet.  Drink enough fluid to keep your urine pale yellow.  If you vomit, rehydrate by drinking water, juice, or clear broth. General instructions  If you have sleep apnea, surgery and certain medicines can increase your risk for breathing problems. Follow instructions from your health care provider about wearing your sleep device: ? Anytime you are sleeping, including during daytime naps. ? While taking prescription pain medicines, sleeping medicines, or medicines that make you drowsy.  Return to  your normal activities as told by your health care provider. Ask your health care provider what activities are safe for you.  Take over-the-counter and prescription medicines only as told by your health care provider.  If you smoke, do not smoke without supervision.  Keep all follow-up visits as told by your health care provider. This is important. Contact a health care provider if:  You have nausea or vomiting that does not get better with medicine.  You cannot eat or drink without vomiting.  You have pain that does not get better with medicine.  You are unable to pass urine.  You develop a skin rash.  You have a fever.  You have redness around your IV site that gets worse. Get help right away if:  You have difficulty breathing.  You have chest pain.  You have blood in your urine or stool, or you vomit blood. Summary  After the procedure, it is common to have a sore throat or nausea. It is also common to feel tired.  Have a responsible adult stay with you for the first 24 hours after general anesthesia. It is important to have someone help care for you until you are awake and alert.  When you feel hungry, start by eating small amounts of foods that are soft and easy to digest (bland), such as toast. Gradually return to your regular diet.  Drink enough fluid to keep your urine pale yellow.  Return to your normal activities as told by your health care provider. Ask your health care provider what activities are safe for you. This information is not   intended to replace advice given to you by your health care provider. Make sure you discuss any questions you have with your health care provider. Document Revised: 06/28/2017 Document Reviewed: 02/08/2017 Elsevier Patient Education  2020 Elsevier Inc.  

## 2020-01-18 ENCOUNTER — Encounter: Payer: Self-pay | Admitting: Gastroenterology

## 2020-01-18 ENCOUNTER — Encounter
Admission: RE | Disposition: A | Discharge status: Home / Self Care | Payer: Self-pay | Source: Home / Self Care | Attending: Gastroenterology

## 2020-01-18 ENCOUNTER — Ambulatory Visit: Payer: PPO | Admitting: Anesthesiology

## 2020-01-18 ENCOUNTER — Ambulatory Visit
Admission: RE | Admit: 2020-01-18 | Discharge: 2020-01-18 | Disposition: A | Discharge status: Home / Self Care | Payer: PPO | Attending: Gastroenterology | Admitting: Gastroenterology

## 2020-01-18 ENCOUNTER — Other Ambulatory Visit: Payer: Self-pay | Admitting: Gastroenterology

## 2020-01-18 ENCOUNTER — Other Ambulatory Visit: Payer: Self-pay

## 2020-01-18 DIAGNOSIS — I739 Peripheral vascular disease, unspecified: Secondary | ICD-10-CM | POA: Insufficient documentation

## 2020-01-18 DIAGNOSIS — Z955 Presence of coronary angioplasty implant and graft: Secondary | ICD-10-CM | POA: Insufficient documentation

## 2020-01-18 DIAGNOSIS — Z79899 Other long term (current) drug therapy: Secondary | ICD-10-CM | POA: Diagnosis not present

## 2020-01-18 DIAGNOSIS — Z86010 Personal history of colonic polyps: Secondary | ICD-10-CM | POA: Diagnosis not present

## 2020-01-18 DIAGNOSIS — Z7902 Long term (current) use of antithrombotics/antiplatelets: Secondary | ICD-10-CM | POA: Insufficient documentation

## 2020-01-18 DIAGNOSIS — Z1211 Encounter for screening for malignant neoplasm of colon: Secondary | ICD-10-CM | POA: Diagnosis not present

## 2020-01-18 DIAGNOSIS — K635 Polyp of colon: Secondary | ICD-10-CM

## 2020-01-18 DIAGNOSIS — I2510 Atherosclerotic heart disease of native coronary artery without angina pectoris: Secondary | ICD-10-CM | POA: Diagnosis not present

## 2020-01-18 DIAGNOSIS — H409 Unspecified glaucoma: Secondary | ICD-10-CM | POA: Insufficient documentation

## 2020-01-18 DIAGNOSIS — Z91041 Radiographic dye allergy status: Secondary | ICD-10-CM | POA: Diagnosis not present

## 2020-01-18 DIAGNOSIS — I10 Essential (primary) hypertension: Secondary | ICD-10-CM | POA: Diagnosis not present

## 2020-01-18 DIAGNOSIS — J42 Unspecified chronic bronchitis: Secondary | ICD-10-CM | POA: Diagnosis not present

## 2020-01-18 DIAGNOSIS — Z7982 Long term (current) use of aspirin: Secondary | ICD-10-CM | POA: Diagnosis not present

## 2020-01-18 DIAGNOSIS — J449 Chronic obstructive pulmonary disease, unspecified: Secondary | ICD-10-CM | POA: Insufficient documentation

## 2020-01-18 DIAGNOSIS — E7800 Pure hypercholesterolemia, unspecified: Secondary | ICD-10-CM | POA: Insufficient documentation

## 2020-01-18 DIAGNOSIS — K219 Gastro-esophageal reflux disease without esophagitis: Secondary | ICD-10-CM | POA: Diagnosis not present

## 2020-01-18 DIAGNOSIS — D124 Benign neoplasm of descending colon: Secondary | ICD-10-CM | POA: Diagnosis not present

## 2020-01-18 DIAGNOSIS — D122 Benign neoplasm of ascending colon: Secondary | ICD-10-CM | POA: Insufficient documentation

## 2020-01-18 DIAGNOSIS — F17210 Nicotine dependence, cigarettes, uncomplicated: Secondary | ICD-10-CM | POA: Insufficient documentation

## 2020-01-18 HISTORY — PX: POLYPECTOMY: SHX5525

## 2020-01-18 HISTORY — PX: COLONOSCOPY WITH PROPOFOL: SHX5780

## 2020-01-18 SURGERY — COLONOSCOPY WITH PROPOFOL
Anesthesia: General | Site: Rectum

## 2020-01-18 MED ORDER — STERILE WATER FOR IRRIGATION IR SOLN
Status: DC | PRN
  Administered 2020-01-18: 50 mL

## 2020-01-18 MED ORDER — PROPOFOL 10 MG/ML IV BOLUS
INTRAVENOUS | Status: DC | PRN
  Administered 2020-01-18: 100 mg via INTRAVENOUS
  Administered 2020-01-18: 30 mg via INTRAVENOUS
  Administered 2020-01-18: 40 mg via INTRAVENOUS
  Administered 2020-01-18: 30 mg via INTRAVENOUS
  Administered 2020-01-18: 40 mg via INTRAVENOUS

## 2020-01-18 MED ORDER — LACTATED RINGERS IV SOLN: INTRAVENOUS | Status: DC

## 2020-01-18 MED ORDER — LIDOCAINE HCL (CARDIAC) PF 100 MG/5ML IV SOSY
PREFILLED_SYRINGE | INTRAVENOUS | Status: DC | PRN
  Administered 2020-01-18: 30 mg via INTRAVENOUS

## 2020-01-18 SURGICAL SUPPLY — 6 items
FORCEPS BIOP RAD 4 LRG CAP 4 (CUTTING FORCEPS) ×3 IMPLANT
GOWN CVR UNV OPN BCK APRN NK (MISCELLANEOUS) ×2 IMPLANT
GOWN ISOL THUMB LOOP REG UNIV (MISCELLANEOUS) ×4
KIT ENDO PROCEDURE OLY (KITS) ×3 IMPLANT
MANIFOLD NEPTUNE II (INSTRUMENTS) ×3 IMPLANT
WATER STERILE IRR 250ML POUR (IV SOLUTION) ×3 IMPLANT

## 2020-01-18 NOTE — Transfer of Care (Signed)
Immediate Anesthesia Transfer of Care Note  Patient: Aaron [name]  Procedure(s) Performed: COLONOSCOPY WITH PROPOFOL (N/A Rectum) POLYPECTOMY (N/A Rectum)  Patient Location: PACU  Anesthesia Type: General  Level of Consciousness: awake, alert  and patient cooperative  Airway and Oxygen Therapy: Patient Spontanous Breathing and Patient connected to supplemental oxygen  Post-op Assessment: Post-op Vital signs reviewed, Patient's Cardiovascular Status Stable, Respiratory Function Stable, Patent Airway and No signs of Nausea or vomiting  Post-op Vital Signs: Reviewed and stable  Complications: No complications documented.

## 2020-01-18 NOTE — Anesthesia Postprocedure Evaluation (Signed)
Anesthesia Post Note  Patient: Aaron [name]  Procedure(s) Performed: COLONOSCOPY WITH PROPOFOL (N/A Rectum) POLYPECTOMY (N/A Rectum)     Patient location during evaluation: PACU Anesthesia Type: General Level of consciousness: awake and alert and oriented Pain management: satisfactory to patient Vital Signs Assessment: post-procedure vital signs reviewed and stable Respiratory status: spontaneous breathing, nonlabored ventilation and respiratory function stable Cardiovascular status: blood pressure returned to baseline and stable Postop Assessment: Adequate PO intake and No signs of nausea or vomiting Anesthetic complications: no   No complications documented.  Raliegh Ip

## 2020-01-18 NOTE — H&P (Signed)
Aaron Lame, MD [facility] [address] [place], [place] [address] [phone] Fax : [phone]  Primary Care Physician:  Glean Hess, MD Primary Gastroenterologist:  Dr. Allen Norris  Pre-Procedure History & Physical: HPI:  Aaron [name] is a 64 y.o. male is here for an colonoscopy.   Past Medical History:  Diagnosis Date   COPD (chronic obstructive pulmonary disease) ([place])    Coronary artery disease    s/p PCI   Fracture of left ankle, closed, initial encounter 10/23/2017   GERD (gastroesophageal reflux disease)    Glaucoma    Gout    Hx of heart artery stent 01/12/2017   Hypercholesteremia    Hypertension    PAD (peripheral artery disease) ([place])    Pain in limb 07/13/2016   Syncope 09/16/2017   Unstable angina ([place]) 01/12/2017   Vertigo     Past Surgical History:  Procedure Laterality Date   APPENDECTOMY     cardiac stents   2012   COLONOSCOPY  03/30/2013   [place]   COLONOSCOPY WITH PROPOFOL N/A 12/10/2019   Procedure: COLONOSCOPY WITH PROPOFOL;  Surgeon: Aaron Lame, MD;  Location: [facility];  Service: Endoscopy;  Laterality: N/A;   LOWER EXTREMITY ANGIOGRAPHY Right 09/25/2018   Procedure: LOWER EXTREMITY ANGIOGRAPHY;  Surgeon: Algernon Huxley, MD;  Location: [facility];  Service: Cardiovascular;  Laterality: Right;    Prior to Admission medications   Medication Sig Start Date End Date Taking? Authorizing Provider  albuterol (VENTOLIN HFA) 108 (90 Base) MCG/ACT inhaler Inhale 2 puffs into the lungs every 6 (six) hours as needed for wheezing or shortness of breath. 02/20/19  Yes Glean Hess, MD  aspirin 325 MG EC tablet Take 1 tablet by mouth daily.   Yes [provider]  CHANTIX 0.5 MG tablet Take 0.5 mg by mouth 2 (two) times daily. 09/24/19  Yes [provider]  cilostazol (PLETAL) 50 MG tablet Take by mouth 2 (two) times daily.  07/15/19  Yes [provider]  clopidogrel (PLAVIX) 75 MG  tablet Take 1 tablet by mouth daily.   Yes [provider]  Fluticasone-Umeclidin-Vilant (TRELEGY ELLIPTA) 100-62.5-25 MCG/INH AEPB Inhale 1 puff into the lungs daily. 05/04/19  Yes [provider]  GAVILYTE-C 240 g solution Drink one 8 oz glass every 20 mins until entire container is finished. 12/21/19  Yes Garvin Ellena, MD  ipratropium-albuterol (DUONEB) 0.5-2.5 (3) MG/3ML SOLN Take 3 mLs by nebulization QID.   Yes [provider]  isosorbide mononitrate (IMDUR) 120 MG 24 hr tablet Take by mouth. 11/17/19  Yes [provider]  lisinopril (PRINIVIL,ZESTRIL) 10 MG tablet Take 1 tablet by mouth daily.   Yes [provider]  metoprolol tartrate (LOPRESSOR) 50 MG tablet Take 50-75 mg by mouth 2 (two) times a day. Take 50MG  by mouth every morning and 75MG  every evening   Yes [provider]  simvastatin (ZOCOR) 40 MG tablet Take 1 tablet (40 mg total) by mouth at bedtime. 05/21/15  Yes Glean Hess, MD  clotrimazole-betamethasone (LOTRISONE) cream Apply 1 application topically 2 (two) times daily. To rash on hips and buttocks Patient not taking: Reported on 11/18/2019 09/14/19   Glean Hess, MD  indomethacin (INDOCIN) 25 MG capsule Take 1 capsule (25 mg total) by mouth 2 (two) times daily as needed. 11/05/19   Glean Hess, MD  nitroGLYCERIN (NITROSTAT) 0.4 MG SL tablet Place 1 tablet under the tongue as needed.    [provider]  pantoprazole (PROTONIX) 40 MG tablet Take 1 tablet (40 mg total) by mouth daily. Patient not taking: Reported on 12/01/2019 07/21/19   Glean Hess, MD    Allergies as of 12/21/2019 - Review Complete 12/10/2019  Allergen Reaction Noted   Contrast media [iodinated diagnostic agents] Hives 11/09/2019   Iodine Hives 11/09/2019    Family History  Problem Relation Age of Onset   Hypertension Mother    Diabetes Mother    Heart failure Father        age 64   Cancer Father    Ovarian cancer  Sister    Heart disease Brother     Social History   Socioeconomic History   Marital status: Single    Spouse name: Not on file   Number of children: 0   Years of education: Not on file   Highest education level: 12th grade  Occupational History   Occupation: Retired/Disabled  Tobacco Use   Smoking status: Current Some Day Smoker    Packs/day: 0.25    Types: Cigars    Start date: 09/03/1976   Smokeless tobacco: Never Used   Tobacco comment: chantix   Vaping Use   Vaping Use: Never used  Substance and Sexual Activity   Alcohol use: Yes    Comment: quit drinking around 04/2019(May have drink 1x/mo)   Drug use: No   Sexual activity: Not Currently    Birth control/protection: None  Other Topics Concern   Not on file  Social History Narrative   Independent at baseline   Social Determinants of Health   Financial Resource Strain:    Difficulty of Paying Living Expenses:   Food Insecurity:    Worried About Charity fundraiser in the Last Year:    Arboriculturist in the Last Year:   Transportation Needs:    Film/video editor (Medical):    Lack of Transportation (Non-Medical):   Physical Activity:    Days of Exercise per Week:    Minutes of Exercise per Session:   Stress:    Feeling of Stress :   Social Connections:    Frequency of Communication with Friends and Family:    Frequency of Social Gatherings with Friends and Family:    Attends Religious Services:    Active Member of Clubs or Organizations:    Attends Music therapist:    Marital Status:   Intimate Partner Violence:    Fear of Current or Ex-Partner:    Emotionally Abused:    Physically Abused:    Sexually Abused:     Review of Systems: See HPI, otherwise negative ROS  Physical Exam: BP 121/87    Pulse 62    Temp 97.7 F (36.5 C) (Temporal)    Resp 16    Ht 6' (1.829 m)    Wt 88.5 kg    SpO2 100%    BMI 26.45 kg/m  General:   Alert,  pleasant and  cooperative in NAD Head:  Normocephalic and atraumatic. Neck:  Supple; no masses or thyromegaly. Lungs:  Clear throughout to auscultation.    Heart:  Regular rate and rhythm. Abdomen:  Soft, nontender and nondistended. Normal bowel sounds, without guarding, and without rebound.   Neurologic:  Alert and  oriented x4;  grossly normal neurologically.  Impression/Plan: KINSLER SOEDER is here for an colonoscopy to be performed for history of adenomatous polyps 03/2013  Risks, benefits, limitations, and alternatives regarding  colonoscopy have been reviewed with the patient.  Questions have been answered.  All parties agreeable.   Aaron Lame, MD  01/18/2020, 10:03 AM

## 2020-01-18 NOTE — Op Note (Signed)
[facility] Gastroenterology Patient Name: Aaron [name] Procedure Date: 01/18/2020 10:07 AM MRN: [id number] Account #: [id number] Date of Birth: [birth year] Admit Type: Outpatient Age: 64 Room: [facility] OR ROOM 01 Gender: Male Note Status: Finalized Procedure:             Colonoscopy Indications:           High risk colon cancer surveillance: Personal history                         of colonic polyps Providers:             [name] MD, MD Referring MD:          Halina Maidens, MD (Referring MD) Medicines:             Propofol per Anesthesia Complications:         No immediate complications. Procedure:             Pre-Anesthesia Assessment:                        - Prior to the procedure, a History and Physical was                         performed, and patient medications and allergies were                         reviewed. The patient's tolerance of previous                         anesthesia was also reviewed. The risks and benefits                         of the procedure and the sedation options and risks                         were discussed with the patient. All questions were                         answered, and informed consent was obtained. Prior                         Anticoagulants: The patient has taken no previous                         anticoagulant or antiplatelet agents. ASA Grade                         Assessment: II - A patient with mild systemic disease.                         After reviewing the risks and benefits, the patient                         was deemed in satisfactory condition to undergo the                         procedure.  After obtaining informed consent, the colonoscope was                         passed under direct vision. Throughout the procedure,                         the patient's blood pressure, pulse, and oxygen                         saturations were monitored continuously. The                          Colonoscope was introduced through the anus and                         advanced to the the cecum, identified by appendiceal                         orifice and ileocecal valve. The colonoscopy was                         performed without difficulty. The patient tolerated                         the procedure well. The quality of the bowel                         preparation was excellent. Findings:      The perianal and digital rectal examinations were normal.      A 3 mm polyp was found in the ascending colon. The polyp was sessile.       The polyp was removed with a cold biopsy forceps. Resection and       retrieval were complete.      A 3 mm polyp was found in the descending colon. The polyp was sessile.       The polyp was removed with a cold biopsy forceps. Resection and       retrieval were complete.      Multiple small-mouthed diverticula were found in the sigmoid colon and       descending colon.      Non-bleeding internal hemorrhoids were found during retroflexion. The       hemorrhoids were Grade I (internal hemorrhoids that do not prolapse). Impression:            - One 3 mm polyp in the ascending colon, removed with                         a cold biopsy forceps. Resected and retrieved.                        - One 3 mm polyp in the descending colon, removed with                         a cold biopsy forceps. Resected and retrieved.                        - Diverticulosis in the sigmoid colon and in the  descending colon.                        - Non-bleeding internal hemorrhoids. Recommendation:        - Discharge patient to home.                        - Resume previous diet.                        - Continue present medications.                        - Await pathology results.                        - Repeat colonoscopy in 5 years for surveillance. Procedure Code(s):     --- Professional ---                        [phone], Colonoscopy,  flexible; with biopsy, single or                         multiple Diagnosis Code(s):     --- Professional ---                        Z86.010, Personal history of colonic polyps                        K63.5, Polyp of colon CPT copyright 2019 American Medical Association. All rights reserved. The codes documented in this report are preliminary and upon coder review may  be revised to meet current compliance requirements. [name] MD, MD 01/18/2020 10:25:23 AM This report has been signed electronically. Number of Addenda: 0 Note Initiated On: 01/18/2020 10:07 AM Scope Withdrawal Time: 0 hours 7 minutes 41 seconds  Total Procedure Duration: 0 hours 9 minutes 48 seconds  Estimated Blood Loss:  Estimated blood loss: none.      [facility]

## 2020-01-18 NOTE — Anesthesia Preprocedure Evaluation (Signed)
Anesthesia Evaluation  Patient identified by MRN, date of birth, ID band Patient awake    Reviewed: Allergy & Precautions, H&P , NPO status , Patient's Chart, lab work & pertinent test results  Airway Mallampati: III  TM Distance: >3 FB Neck ROM: full    Dental  (+) Poor Dentition, Missing   Pulmonary COPD, Current SmokerPatient did not abstain from smoking.,    Pulmonary exam normal breath sounds clear to auscultation       Cardiovascular hypertension, + angina + CAD  Normal cardiovascular exam Rhythm:regular Rate:Normal     Neuro/Psych    GI/Hepatic GERD  ,  Endo/Other    Renal/GU      Musculoskeletal   Abdominal   Peds  Hematology   Anesthesia Other Findings   Reproductive/Obstetrics                             Anesthesia Physical Anesthesia Plan  ASA: III  Anesthesia Plan: General   Post-op Pain Management:    Induction: Intravenous  PONV Risk Score and Plan: 2 and Treatment may vary due to age or medical condition and Propofol infusion  Airway Management Planned: Natural Airway  Additional Equipment:   Intra-op Plan:   Post-operative Plan:   Informed Consent: I have reviewed the patients History and Physical, chart, labs and discussed the procedure including the risks, benefits and alternatives for the proposed anesthesia with the patient or authorized representative who has indicated his/her understanding and acceptance.     Dental Advisory Given  Plan Discussed with: CRNA  Anesthesia Plan Comments:         Anesthesia Quick Evaluation

## 2020-01-18 NOTE — Anesthesia Procedure Notes (Signed)
Date/Time: 01/18/2020 10:08 AM Performed by: Cameron Ali, CRNA Pre-anesthesia Checklist: Patient identified, Emergency Drugs available, Suction available, Timeout performed and Patient being monitored Patient Re-evaluated:Patient Re-evaluated prior to induction Oxygen Delivery Method: Nasal cannula Placement Confirmation: positive ETCO2

## 2020-01-19 ENCOUNTER — Encounter: Payer: Self-pay | Admitting: Gastroenterology

## 2020-01-19 LAB — SURGICAL PATHOLOGY

## 2020-01-20 ENCOUNTER — Encounter: Payer: Self-pay | Admitting: Gastroenterology

## 2020-01-21 LAB — PATHOLOGY

## 2020-01-27 DIAGNOSIS — Z01818 Encounter for other preprocedural examination: Secondary | ICD-10-CM | POA: Diagnosis not present

## 2020-01-27 DIAGNOSIS — J42 Unspecified chronic bronchitis: Secondary | ICD-10-CM | POA: Diagnosis not present

## 2020-02-10 DIAGNOSIS — J42 Unspecified chronic bronchitis: Secondary | ICD-10-CM | POA: Diagnosis not present

## 2020-02-17 ENCOUNTER — Telehealth (INDEPENDENT_AMBULATORY_CARE_PROVIDER_SITE_OTHER): Payer: Self-pay | Admitting: Vascular Surgery

## 2020-02-17 NOTE — Telephone Encounter (Signed)
It appears the patient hasn't been seen by our office following his angiogram in 2020..he should come in with ABIS and an aorta illiac..see me or dew

## 2020-02-17 NOTE — Telephone Encounter (Signed)
Pt was last seen on 09/12/18 by the NP for  a Follow up and a review of non invasive studies. It was noted in the assessment plan the pt is having increased symptoms and is now having lifestyle limitation claudication. And mild rest pain it was reccommended that due to the severity  of the RLE symptoms  that the pt have an angiography and follow up with the NP afterwards. The angio was performed on 09/25/18. The pt also had ABI  W/O TBI done by Dr. Lucky Cowboy on 01/15/2020

## 2020-02-17 NOTE — Telephone Encounter (Signed)
Called stating the past month both legs having been cramping, the left leg more so than the right. Pain shooting through his legs to his hips. He would like to come in to be seen. Patient was last seen 09-25-18 in ED with LE Angio procedure done by JD. Please advise.

## 2020-02-17 NOTE — Telephone Encounter (Signed)
Please call the pt and scheduled him for the  following per the provider.

## 2020-02-18 DIAGNOSIS — J42 Unspecified chronic bronchitis: Secondary | ICD-10-CM | POA: Diagnosis not present

## 2020-03-04 ENCOUNTER — Other Ambulatory Visit (INDEPENDENT_AMBULATORY_CARE_PROVIDER_SITE_OTHER): Payer: Self-pay | Admitting: Nurse Practitioner

## 2020-03-04 DIAGNOSIS — I739 Peripheral vascular disease, unspecified: Secondary | ICD-10-CM

## 2020-03-07 ENCOUNTER — Ambulatory Visit (INDEPENDENT_AMBULATORY_CARE_PROVIDER_SITE_OTHER): Payer: PPO | Admitting: Nurse Practitioner

## 2020-03-07 ENCOUNTER — Ambulatory Visit (INDEPENDENT_AMBULATORY_CARE_PROVIDER_SITE_OTHER): Payer: PPO

## 2020-03-07 ENCOUNTER — Encounter (INDEPENDENT_AMBULATORY_CARE_PROVIDER_SITE_OTHER): Payer: Self-pay | Admitting: Nurse Practitioner

## 2020-03-07 ENCOUNTER — Other Ambulatory Visit: Payer: Self-pay

## 2020-03-07 VITALS — BP 111/69 | HR 62 | Ht 73.0 in | Wt 199.0 lb

## 2020-03-07 DIAGNOSIS — I10 Essential (primary) hypertension: Secondary | ICD-10-CM

## 2020-03-07 DIAGNOSIS — E782 Mixed hyperlipidemia: Secondary | ICD-10-CM | POA: Diagnosis not present

## 2020-03-07 DIAGNOSIS — F17200 Nicotine dependence, unspecified, uncomplicated: Secondary | ICD-10-CM

## 2020-03-07 DIAGNOSIS — I739 Peripheral vascular disease, unspecified: Secondary | ICD-10-CM

## 2020-03-07 NOTE — Progress Notes (Signed)
Subjective:    Patient ID: Aaron [name], male    DOB: 1956-03-05, 64 y.o.   MRN: [id number] Chief Complaint  Patient presents with  . Follow-up    add on per phone note u/s f/u    The patient returns to the office for followup and review of the noninvasive studies.  The patient underwent angiogram on 09/25/2018 and subsequently has not followed up in our office.  The patient contacted our office several days ago to note that, there has been a significant deterioration in the lower extremity symptoms.  The patient notes that the pain is mostly in his left calf area however recently it has been in his hips and buttocks area.  The patient notes interval shortening of their claudication distance and development of mild rest pain symptoms. No new ulcers or wounds have occurred since the last visit.  On 09/25/2018 the patient underwent extensive intervention including:  Procedure(s) Performed: 1. Ultrasound guidance for vascular access left femoral artery 2. Catheter placement into right SFA from left femoral approach 3. Aortogram and selective right lower extremity angiogram 4. Percutaneous transluminal angioplasty of right anterior tibial artery with 4 mm diameter by 10 cm length Lutonix drug-coated angioplasty balloon 5.  Percutaneous transluminal angioplasty of the right SFA and popliteal artery with 5 mm diameter by 30 cm length Lutonix drug-coated angioplasty balloon             6.  Stent placement to the right SFA with 6 mm diameter by 8 cm length self-expanding stent for greater than 50% residual stenosis after angioplasty in this location             7.  Percutaneous transluminal angioplasty of the right external iliac artery in the mid to distal external iliac artery with 7 mm diameter by 6 cm length Lutonix drug-coated angioplasty balloon             8.  Covered stent placement to the right common iliac artery with 9 mm  diameter by 38 mm length lifestream stent             9.  Percutaneous transluminal angioplasty of the left mid to distal common iliac artery and proximal external iliac artery with 7 mm diameter by 8 cm length Lutonix drug-coated angioplasty balloon             10.  Life star stent placement to the left distal common iliac and proximal left external iliac artery with 8 mm diameter by 4 cm length stent 11. StarClose closure device left femoral artery    There have been no significant changes to the patient's overall health care.  The patient denies amaurosis fugax or recent TIA symptoms. There are no recent neurological changes noted. The patient denies history of DVT, PE or superficial thrombophlebitis. The patient denies recent episodes of angina or shortness of breath.   ABI's Rt=1.03and Lt=0.83 (previous ABI's Rt=0.65 and Lt=0.88) Duplex US of the lower extremity arterial system shows triphasic waveforms in the right tibial artery with monophasic waveforms in the left tibial artery.  There are good right digit waveforms with dampened left toe digit waveforms.   Review of Systems  Cardiovascular:       Claudication   All other systems reviewed and are negative.      Objective:   Physical Exam Vitals reviewed.  HENT:     Head: Normocephalic.  Cardiovascular:     Rate and Rhythm: Normal rate and regular rhythm.  Pulses: Normal pulses.  Pulmonary:     Effort: Pulmonary effort is normal.     Breath sounds: Examination of the right-upper field reveals decreased breath sounds. Examination of the left-upper field reveals decreased breath sounds. Examination of the right-middle field reveals rhonchi. Decreased breath sounds and rhonchi present.  Neurological:     Mental Status: He is alert and oriented to person, place, and time.  Psychiatric:        Mood and Affect: Mood normal.        Behavior: Behavior normal.        Thought Content: Thought content normal.         Judgment: Judgment normal.     BP 111/69   Pulse 62   Ht 6\' 1"  (1.854 m)   Wt 199 lb (90.3 kg)   BMI 26.25 kg/m   Past Medical History:  Diagnosis Date  . COPD (chronic obstructive pulmonary disease) ([place])   . Coronary artery disease    s/p PCI  . Fracture of left ankle, closed, initial encounter 10/23/2017  . GERD (gastroesophageal reflux disease)   . Glaucoma   . Gout   . Hx of heart artery stent 01/12/2017  . Hypercholesteremia   . Hypertension   . PAD (peripheral artery disease) ([place])   . Pain in limb 07/13/2016  . Syncope 09/16/2017  . Unstable angina ([place]) 01/12/2017  . Vertigo     Social History   Socioeconomic History  . Marital status: Single    Spouse name: Not on file  . Number of children: 0  . Years of education: Not on file  . Highest education level: 12th grade  Occupational History  . Occupation: Retired/Disabled  Tobacco Use  . Smoking status: Current Some Day Smoker    Packs/day: 0.25    Types: Cigars    Start date: 09/03/1976  . Smokeless tobacco: Never Used  . Tobacco comment: chantix   Vaping Use  . Vaping Use: Never used  Substance and Sexual Activity  . Alcohol use: Yes    Comment: quit drinking around 04/2019(May have drink 1x/mo)  . Drug use: No  . Sexual activity: Not Currently    Birth control/protection: None  Other Topics Concern  . Not on file  Social History Narrative   Independent at baseline   Social Determinants of Health   Financial Resource Strain:   . Difficulty of Paying Living Expenses: Not on file  Food Insecurity:   . Worried About Charity fundraiser in the Last Year: Not on file  . Ran Out of Food in the Last Year: Not on file  Transportation Needs:   . Lack of Transportation (Medical): Not on file  . Lack of Transportation (Non-Medical): Not on file  Physical Activity:   . Days of Exercise per Week: Not on file  . Minutes of Exercise per Session: Not on file  Stress:   . Feeling of Stress : Not on file    Social Connections:   . Frequency of Communication with Friends and Family: Not on file  . Frequency of Social Gatherings with Friends and Family: Not on file  . Attends Religious Services: Not on file  . Active Member of Clubs or Organizations: Not on file  . Attends Archivist Meetings: Not on file  . Marital Status: Not on file  Intimate Partner Violence:   . Fear of Current or Ex-Partner: Not on file  . Emotionally Abused: Not on file  . Physically Abused:  Not on file  . Sexually Abused: Not on file    Past Surgical History:  Procedure Laterality Date  . APPENDECTOMY    . cardiac stents   2012  . COLONOSCOPY  03/30/2013   [place]  . COLONOSCOPY WITH PROPOFOL N/A 12/10/2019   Procedure: COLONOSCOPY WITH PROPOFOL;  Surgeon: [name], MD;  Location: [facility];  Service: Endoscopy;  Laterality: N/A;  . COLONOSCOPY WITH PROPOFOL N/A 01/18/2020   Procedure: COLONOSCOPY WITH PROPOFOL;  Surgeon: [name], MD;  Location: [facility];  Service: Endoscopy;  Laterality: N/A;  4  . LOWER EXTREMITY ANGIOGRAPHY Right 09/25/2018   Procedure: LOWER EXTREMITY ANGIOGRAPHY;  Surgeon: Algernon Huxley, MD;  Location: [facility];  Service: Cardiovascular;  Laterality: Right;  . POLYPECTOMY N/A 01/18/2020   Procedure: POLYPECTOMY;  Surgeon: [name], MD;  Location: [facility];  Service: Endoscopy;  Laterality: N/A;    Family History  Problem Relation Age of Onset  . Hypertension Mother   . Diabetes Mother   . Heart failure Father        age 60  . Cancer Father   . Ovarian cancer Sister   . Heart disease Brother     Allergies  Allergen Reactions  . Contrast Media [Iodinated Diagnostic Agents] Hives  . Iodine Hives       Assessment & Plan:   1. Peripheral vascular disease ([place]) Recommend:  The patient has experienced increased symptoms and is now describing lifestyle limiting claudication and mild rest pain.   Given the severity  of the patient's lower extremity symptoms the patient should undergo angiography and intervention.  Risk and benefits were reviewed the patient.  Indications for the procedure were reviewed.  All questions were answered, the patient agrees to proceed.   The patient should continue walking and begin a more formal exercise program.  The patient should continue antiplatelet therapy and aggressive treatment of the lipid abnormalities  The patient will follow up with me after the angiogram.   2. Essential (primary) hypertension Continue antihypertensive medications as already ordered, these medications have been reviewed and there are no changes at this time.   3. Mixed hyperlipidemia Continue statin as ordered and reviewed, no changes at this time   4. Tobacco use disorder Smoking cessation was discussed, 3-10 minutes spent on this topic specifically    Current Outpatient Medications on File Prior to Visit  Medication Sig Dispense Refill  . albuterol (VENTOLIN HFA) 108 (90 Base) MCG/ACT inhaler Inhale 2 puffs into the lungs every 6 (six) hours as needed for wheezing or shortness of breath. 6.7 g 5  . aspirin 325 MG EC tablet Take 1 tablet by mouth daily.    . CHANTIX 0.5 MG tablet Take 0.5 mg by mouth 2 (two) times daily.    . cilostazol (PLETAL) 50 MG tablet Take by mouth 2 (two) times daily.     . clopidogrel (PLAVIX) 75 MG tablet Take 1 tablet by mouth daily.    . clotrimazole-betamethasone (LOTRISONE) cream Apply 1 application topically 2 (two) times daily. To rash on hips and buttocks 45 g 0  . Fluticasone-Umeclidin-Vilant (TRELEGY ELLIPTA) 100-62.5-25 MCG/INH AEPB Inhale 1 puff into the lungs daily.    [name] GAVILYTE-C 240 g solution Drink one 8 oz glass every 20 mins until entire container is finished. 4000 mL 0  . indomethacin (INDOCIN) 25 MG capsule Take 1 capsule (25 mg total) by mouth 2 (two) times daily as needed. 30 capsule 1  .  ipratropium (ATROVENT) 0.06 % nasal spray Place into  both nostrils.    [name] ipratropium-albuterol (DUONEB) 0.5-2.5 (3) MG/3ML SOLN Take 3 mLs by nebulization QID.    [name] isosorbide mononitrate (IMDUR) 120 MG 24 hr tablet Take by mouth.    [name] lisinopril (PRINIVIL,ZESTRIL) 10 MG tablet Take 1 tablet by mouth daily.    . metoprolol tartrate (LOPRESSOR) 50 MG tablet Take 50-75 mg by mouth 2 (two) times a day. Take 50MG  by mouth every morning and 75MG  every evening    . montelukast (SINGULAIR) 10 MG tablet Take by mouth.    . montelukast (SINGULAIR) 10 MG tablet Take 10 mg by mouth daily.    . nitroGLYCERIN (NITROSTAT) 0.4 MG SL tablet Place 1 tablet under the tongue as needed.    . pantoprazole (PROTONIX) 40 MG tablet Take 1 tablet (40 mg total) by mouth daily. 30 tablet 3  . simvastatin (ZOCOR) 40 MG tablet Take 1 tablet (40 mg total) by mouth at bedtime. 30 tablet 5   No current facility-administered medications on file prior to visit.    There are no Patient Instructions on file for this visit. No follow-ups on file.   Kris Hartmann, NP

## 2020-03-07 NOTE — H&P (View-Only) (Signed)
Subjective:    Patient ID: Aaron [name], male    DOB: 04/25/1956, 64 y.o.   MRN: [id number] Chief Complaint  Patient presents with  . Follow-up    add on per phone note u/s f/u    The patient returns to the office for followup and review of the noninvasive studies.  The patient underwent angiogram on 09/25/2018 and subsequently has not followed up in our office.  The patient contacted our office several days ago to note that, there has been a significant deterioration in the lower extremity symptoms.  The patient notes that the pain is mostly in his left calf area however recently it has been in his hips and buttocks area.  The patient notes interval shortening of their claudication distance and development of mild rest pain symptoms. No new ulcers or wounds have occurred since the last visit.  On 09/25/2018 the patient underwent extensive intervention including:  Procedure(s) Performed: 1. Ultrasound guidance for vascular access left femoral artery 2. Catheter placement into right SFA from left femoral approach 3. Aortogram and selective right lower extremity angiogram 4. Percutaneous transluminal angioplasty of right anterior tibial artery with 4 mm diameter by 10 cm length Lutonix drug-coated angioplasty balloon 5.  Percutaneous transluminal angioplasty of the right SFA and popliteal artery with 5 mm diameter by 30 cm length Lutonix drug-coated angioplasty balloon             6.  Stent placement to the right SFA with 6 mm diameter by 8 cm length self-expanding stent for greater than 50% residual stenosis after angioplasty in this location             7.  Percutaneous transluminal angioplasty of the right external iliac artery in the mid to distal external iliac artery with 7 mm diameter by 6 cm length Lutonix drug-coated angioplasty balloon             8.  Covered stent placement to the right common iliac artery with 9 mm  diameter by 38 mm length lifestream stent             9.  Percutaneous transluminal angioplasty of the left mid to distal common iliac artery and proximal external iliac artery with 7 mm diameter by 8 cm length Lutonix drug-coated angioplasty balloon             10.  Life star stent placement to the left distal common iliac and proximal left external iliac artery with 8 mm diameter by 4 cm length stent 11. StarClose closure device left femoral artery    There have been no significant changes to the patient's overall health care.  The patient denies amaurosis fugax or recent TIA symptoms. There are no recent neurological changes noted. The patient denies history of DVT, PE or superficial thrombophlebitis. The patient denies recent episodes of angina or shortness of breath.   ABI's Rt=1.03and Lt=0.83 (previous ABI's Rt=0.65 and Lt=0.88) Duplex US of the lower extremity arterial system shows triphasic waveforms in the right tibial artery with monophasic waveforms in the left tibial artery.  There are good right digit waveforms with dampened left toe digit waveforms.   Review of Systems  Cardiovascular:       Claudication   All other systems reviewed and are negative.      Objective:   Physical Exam Vitals reviewed.  HENT:     Head: Normocephalic.  Cardiovascular:     Rate and Rhythm: Normal rate and regular rhythm.  Pulses: Normal pulses.  Pulmonary:     Effort: Pulmonary effort is normal.     Breath sounds: Examination of the right-upper field reveals decreased breath sounds. Examination of the left-upper field reveals decreased breath sounds. Examination of the right-middle field reveals rhonchi. Decreased breath sounds and rhonchi present.  Neurological:     Mental Status: He is alert and oriented to person, place, and time.  Psychiatric:        Mood and Affect: Mood normal.        Behavior: Behavior normal.        Thought Content: Thought content normal.         Judgment: Judgment normal.     BP 111/69   Pulse 62   Ht 6\' 1"  (1.854 m)   Wt 199 lb (90.3 kg)   BMI 26.25 kg/m   Past Medical History:  Diagnosis Date  . COPD (chronic obstructive pulmonary disease) ([place])   . Coronary artery disease    s/p PCI  . Fracture of left ankle, closed, initial encounter 10/23/2017  . GERD (gastroesophageal reflux disease)   . Glaucoma   . Gout   . Hx of heart artery stent 01/12/2017  . Hypercholesteremia   . Hypertension   . PAD (peripheral artery disease) ([place])   . Pain in limb 07/13/2016  . Syncope 09/16/2017  . Unstable angina ([place]) 01/12/2017  . Vertigo     Social History   Socioeconomic History  . Marital status: Single    Spouse name: Not on file  . Number of children: 0  . Years of education: Not on file  . Highest education level: 12th grade  Occupational History  . Occupation: Retired/Disabled  Tobacco Use  . Smoking status: Current Some Day Smoker    Packs/day: 0.25    Types: Cigars    Start date: 09/03/1976  . Smokeless tobacco: Never Used  . Tobacco comment: chantix   Vaping Use  . Vaping Use: Never used  Substance and Sexual Activity  . Alcohol use: Yes    Comment: quit drinking around 04/2019(May have drink 1x/mo)  . Drug use: No  . Sexual activity: Not Currently    Birth control/protection: None  Other Topics Concern  . Not on file  Social History Narrative   Independent at baseline   Social Determinants of Health   Financial Resource Strain:   . Difficulty of Paying Living Expenses: Not on file  Food Insecurity:   . Worried About Charity fundraiser in the Last Year: Not on file  . Ran Out of Food in the Last Year: Not on file  Transportation Needs:   . Lack of Transportation (Medical): Not on file  . Lack of Transportation (Non-Medical): Not on file  Physical Activity:   . Days of Exercise per Week: Not on file  . Minutes of Exercise per Session: Not on file  Stress:   . Feeling of Stress : Not on file    Social Connections:   . Frequency of Communication with Friends and Family: Not on file  . Frequency of Social Gatherings with Friends and Family: Not on file  . Attends Religious Services: Not on file  . Active Member of Clubs or Organizations: Not on file  . Attends Archivist Meetings: Not on file  . Marital Status: Not on file  Intimate Partner Violence:   . Fear of Current or Ex-Partner: Not on file  . Emotionally Abused: Not on file  . Physically Abused:  Not on file  . Sexually Abused: Not on file    Past Surgical History:  Procedure Laterality Date  . APPENDECTOMY    . cardiac stents   2012  . COLONOSCOPY  03/30/2013   [place]  . COLONOSCOPY WITH PROPOFOL N/A 12/10/2019   Procedure: COLONOSCOPY WITH PROPOFOL;  Surgeon: [name], MD;  Location: [facility];  Service: Endoscopy;  Laterality: N/A;  . COLONOSCOPY WITH PROPOFOL N/A 01/18/2020   Procedure: COLONOSCOPY WITH PROPOFOL;  Surgeon: [name], MD;  Location: [facility];  Service: Endoscopy;  Laterality: N/A;  4  . LOWER EXTREMITY ANGIOGRAPHY Right 09/25/2018   Procedure: LOWER EXTREMITY ANGIOGRAPHY;  Surgeon: Algernon Huxley, MD;  Location: [facility];  Service: Cardiovascular;  Laterality: Right;  . POLYPECTOMY N/A 01/18/2020   Procedure: POLYPECTOMY;  Surgeon: [name], MD;  Location: [facility];  Service: Endoscopy;  Laterality: N/A;    Family History  Problem Relation Age of Onset  . Hypertension Mother   . Diabetes Mother   . Heart failure Father        age 23  . Cancer Father   . Ovarian cancer Sister   . Heart disease Brother     Allergies  Allergen Reactions  . Contrast Media [Iodinated Diagnostic Agents] Hives  . Iodine Hives       Assessment & Plan:   1. Peripheral vascular disease ([place]) Recommend:  The patient has experienced increased symptoms and is now describing lifestyle limiting claudication and mild rest pain.   Given the severity  of the patient's lower extremity symptoms the patient should undergo angiography and intervention.  Risk and benefits were reviewed the patient.  Indications for the procedure were reviewed.  All questions were answered, the patient agrees to proceed.   The patient should continue walking and begin a more formal exercise program.  The patient should continue antiplatelet therapy and aggressive treatment of the lipid abnormalities  The patient will follow up with me after the angiogram.   2. Essential (primary) hypertension Continue antihypertensive medications as already ordered, these medications have been reviewed and there are no changes at this time.   3. Mixed hyperlipidemia Continue statin as ordered and reviewed, no changes at this time   4. Tobacco use disorder Smoking cessation was discussed, 3-10 minutes spent on this topic specifically    Current Outpatient Medications on File Prior to Visit  Medication Sig Dispense Refill  . albuterol (VENTOLIN HFA) 108 (90 Base) MCG/ACT inhaler Inhale 2 puffs into the lungs every 6 (six) hours as needed for wheezing or shortness of breath. 6.7 g 5  . aspirin 325 MG EC tablet Take 1 tablet by mouth daily.    . CHANTIX 0.5 MG tablet Take 0.5 mg by mouth 2 (two) times daily.    . cilostazol (PLETAL) 50 MG tablet Take by mouth 2 (two) times daily.     . clopidogrel (PLAVIX) 75 MG tablet Take 1 tablet by mouth daily.    . clotrimazole-betamethasone (LOTRISONE) cream Apply 1 application topically 2 (two) times daily. To rash on hips and buttocks 45 g 0  . Fluticasone-Umeclidin-Vilant (TRELEGY ELLIPTA) 100-62.5-25 MCG/INH AEPB Inhale 1 puff into the lungs daily.    [name] GAVILYTE-C 240 g solution Drink one 8 oz glass every 20 mins until entire container is finished. 4000 mL 0  . indomethacin (INDOCIN) 25 MG capsule Take 1 capsule (25 mg total) by mouth 2 (two) times daily as needed. 30 capsule 1  .  ipratropium (ATROVENT) 0.06 % nasal spray Place into  both nostrils.    [name] ipratropium-albuterol (DUONEB) 0.5-2.5 (3) MG/3ML SOLN Take 3 mLs by nebulization QID.    [name] isosorbide mononitrate (IMDUR) 120 MG 24 hr tablet Take by mouth.    [name] lisinopril (PRINIVIL,ZESTRIL) 10 MG tablet Take 1 tablet by mouth daily.    . metoprolol tartrate (LOPRESSOR) 50 MG tablet Take 50-75 mg by mouth 2 (two) times a day. Take 50MG  by mouth every morning and 75MG  every evening    . montelukast (SINGULAIR) 10 MG tablet Take by mouth.    . montelukast (SINGULAIR) 10 MG tablet Take 10 mg by mouth daily.    . nitroGLYCERIN (NITROSTAT) 0.4 MG SL tablet Place 1 tablet under the tongue as needed.    . pantoprazole (PROTONIX) 40 MG tablet Take 1 tablet (40 mg total) by mouth daily. 30 tablet 3  . simvastatin (ZOCOR) 40 MG tablet Take 1 tablet (40 mg total) by mouth at bedtime. 30 tablet 5   No current facility-administered medications on file prior to visit.    There are no Patient Instructions on file for this visit. No follow-ups on file.   Kris Hartmann, NP

## 2020-03-08 ENCOUNTER — Telehealth (INDEPENDENT_AMBULATORY_CARE_PROVIDER_SITE_OTHER): Payer: Self-pay

## 2020-03-08 NOTE — Telephone Encounter (Signed)
I attempted to contact the patient and was unable to leave a message. The message stated the wireless customer was not available and no voicemail was offered.

## 2020-03-08 NOTE — Telephone Encounter (Signed)
Patient returned my call and is now scheduled with Dr. Lucky Cowboy for angio on 03/17/20 with a 8:30 am arrival time to the [facility]. Covid testing on 03/15/20 between 8-1 pm at the [facility]. Pre-procedure instructions were discussed and will be mailed.

## 2020-03-15 ENCOUNTER — Other Ambulatory Visit: Payer: Self-pay

## 2020-03-15 ENCOUNTER — Other Ambulatory Visit
Admission: RE | Admit: 2020-03-15 | Discharge: 2020-03-15 | Disposition: A | Discharge status: Home / Self Care | Payer: PPO | Source: Ambulatory Visit | Attending: Vascular Surgery | Admitting: Vascular Surgery

## 2020-03-15 DIAGNOSIS — Z20822 Contact with and (suspected) exposure to covid-19: Secondary | ICD-10-CM | POA: Insufficient documentation

## 2020-03-15 DIAGNOSIS — Z01812 Encounter for preprocedural laboratory examination: Secondary | ICD-10-CM | POA: Insufficient documentation

## 2020-03-16 ENCOUNTER — Other Ambulatory Visit (INDEPENDENT_AMBULATORY_CARE_PROVIDER_SITE_OTHER): Payer: Self-pay | Admitting: Nurse Practitioner

## 2020-03-16 LAB — SARS CORONAVIRUS 2 (TAT 6-24 HRS): SARS Coronavirus 2: NEGATIVE

## 2020-03-16 MED ORDER — CEFAZOLIN SODIUM-DEXTROSE 2-4 GM/100ML-% IV SOLN: 2.0000 g | Freq: Once | INTRAVENOUS | Status: AC

## 2020-03-17 ENCOUNTER — Other Ambulatory Visit: Payer: Self-pay

## 2020-03-17 ENCOUNTER — Encounter
Admission: RE | Disposition: A | Discharge status: Home / Self Care | Payer: Self-pay | Source: Home / Self Care | Attending: Vascular Surgery

## 2020-03-17 ENCOUNTER — Encounter: Payer: Self-pay | Admitting: Vascular Surgery

## 2020-03-17 ENCOUNTER — Ambulatory Visit
Admission: RE | Admit: 2020-03-17 | Discharge: 2020-03-17 | Disposition: A | Discharge status: Home / Self Care | Payer: PPO | Attending: Vascular Surgery | Admitting: Vascular Surgery

## 2020-03-17 DIAGNOSIS — I10 Essential (primary) hypertension: Secondary | ICD-10-CM | POA: Insufficient documentation

## 2020-03-17 DIAGNOSIS — Z79899 Other long term (current) drug therapy: Secondary | ICD-10-CM | POA: Insufficient documentation

## 2020-03-17 DIAGNOSIS — K219 Gastro-esophageal reflux disease without esophagitis: Secondary | ICD-10-CM | POA: Insufficient documentation

## 2020-03-17 DIAGNOSIS — E782 Mixed hyperlipidemia: Secondary | ICD-10-CM | POA: Insufficient documentation

## 2020-03-17 DIAGNOSIS — M109 Gout, unspecified: Secondary | ICD-10-CM | POA: Insufficient documentation

## 2020-03-17 DIAGNOSIS — Z7902 Long term (current) use of antithrombotics/antiplatelets: Secondary | ICD-10-CM | POA: Diagnosis not present

## 2020-03-17 DIAGNOSIS — I70212 Atherosclerosis of native arteries of extremities with intermittent claudication, left leg: Secondary | ICD-10-CM | POA: Diagnosis not present

## 2020-03-17 DIAGNOSIS — F17290 Nicotine dependence, other tobacco product, uncomplicated: Secondary | ICD-10-CM | POA: Diagnosis not present

## 2020-03-17 DIAGNOSIS — Z955 Presence of coronary angioplasty implant and graft: Secondary | ICD-10-CM | POA: Insufficient documentation

## 2020-03-17 DIAGNOSIS — J449 Chronic obstructive pulmonary disease, unspecified: Secondary | ICD-10-CM | POA: Diagnosis not present

## 2020-03-17 DIAGNOSIS — Z7982 Long term (current) use of aspirin: Secondary | ICD-10-CM | POA: Diagnosis not present

## 2020-03-17 DIAGNOSIS — E7800 Pure hypercholesterolemia, unspecified: Secondary | ICD-10-CM | POA: Diagnosis not present

## 2020-03-17 DIAGNOSIS — I70222 Atherosclerosis of native arteries of extremities with rest pain, left leg: Secondary | ICD-10-CM | POA: Insufficient documentation

## 2020-03-17 DIAGNOSIS — I25110 Atherosclerotic heart disease of native coronary artery with unstable angina pectoris: Secondary | ICD-10-CM | POA: Insufficient documentation

## 2020-03-17 DIAGNOSIS — I70219 Atherosclerosis of native arteries of extremities with intermittent claudication, unspecified extremity: Secondary | ICD-10-CM

## 2020-03-17 HISTORY — PX: LOWER EXTREMITY ANGIOGRAPHY: CATH118251

## 2020-03-17 LAB — CREATININE, SERUM
Creatinine, Ser: 0.93 mg/dL (ref 0.61–1.24)
GFR calc Af Amer: >60 mL/min (ref >60)
GFR calc non Af Amer: >60 mL/min (ref >60)

## 2020-03-17 LAB — BUN: BUN: 19 mg/dL (ref 8–23)

## 2020-03-17 SURGERY — LOWER EXTREMITY ANGIOGRAPHY
Anesthesia: Moderate Sedation | Site: Leg Lower | Laterality: Left

## 2020-03-17 MED ORDER — ASPIRIN EC 81 MG PO TBEC: 81.0000 mg | DELAYED_RELEASE_TABLET | Freq: Every day | ORAL | 2 refills | Status: DC

## 2020-03-17 MED ORDER — ACETAMINOPHEN 160 MG/5ML PO SOLN
325.0000 mg | Freq: Once | ORAL | Status: DC
  Filled 2020-03-17: qty 20.3

## 2020-03-17 MED ORDER — FAMOTIDINE 20 MG PO TABS: 40.0000 mg | ORAL_TABLET | Freq: Once | ORAL | Status: AC | PRN

## 2020-03-17 MED ORDER — HEPARIN SODIUM (PORCINE) 1000 UNIT/ML IJ SOLN
INTRAMUSCULAR | Status: DC | PRN
  Administered 2020-03-17: 5000 [IU] via INTRAVENOUS

## 2020-03-17 MED ORDER — FENTANYL CITRATE (PF) 100 MCG/2ML IJ SOLN
INTRAMUSCULAR | Status: DC | PRN
Start: 2020-03-17 — End: 2020-03-17
  Administered 2020-03-17: 50 ug via INTRAVENOUS

## 2020-03-17 MED ORDER — MIDAZOLAM HCL 2 MG/ML PO SYRP: 8.0000 mg | ORAL_SOLUTION | Freq: Once | ORAL | Status: DC | PRN

## 2020-03-17 MED ORDER — HYDROMORPHONE HCL 1 MG/ML IJ SOLN: 1.0000 mg | Freq: Once | INTRAMUSCULAR | Status: DC | PRN

## 2020-03-17 MED ORDER — HEPARIN SODIUM (PORCINE) 1000 UNIT/ML IJ SOLN
INTRAMUSCULAR | Status: AC
  Filled 2020-03-17: qty 1

## 2020-03-17 MED ORDER — DIPHENHYDRAMINE HCL 50 MG/ML IJ SOLN: 50.0000 mg | Freq: Once | INTRAMUSCULAR | Status: AC | PRN

## 2020-03-17 MED ORDER — MIDAZOLAM HCL 5 MG/5ML IJ SOLN
INTRAMUSCULAR | Status: AC
  Filled 2020-03-17: qty 5

## 2020-03-17 MED ORDER — FAMOTIDINE 20 MG PO TABS
ORAL_TABLET | ORAL | Status: AC
  Administered 2020-03-17: 40 mg via ORAL
  Filled 2020-03-17: qty 2

## 2020-03-17 MED ORDER — MIDAZOLAM HCL 2 MG/2ML IJ SOLN
INTRAMUSCULAR | Status: DC | PRN
  Administered 2020-03-17: 2 mg via INTRAVENOUS

## 2020-03-17 MED ORDER — ONDANSETRON HCL 4 MG/2ML IJ SOLN: 4.0000 mg | Freq: Four times a day (QID) | INTRAMUSCULAR | Status: DC | PRN

## 2020-03-17 MED ORDER — FENTANYL CITRATE (PF) 100 MCG/2ML IJ SOLN
INTRAMUSCULAR | Status: DC
Start: 2020-03-17 — End: 2020-03-17
  Filled 2020-03-17: qty 2

## 2020-03-17 MED ORDER — CEFAZOLIN SODIUM-DEXTROSE 2-4 GM/100ML-% IV SOLN
INTRAVENOUS | Status: AC
  Administered 2020-03-17: 2 g via INTRAVENOUS
  Filled 2020-03-17: qty 100

## 2020-03-17 MED ORDER — DIPHENHYDRAMINE HCL 50 MG/ML IJ SOLN
INTRAMUSCULAR | Status: AC
  Administered 2020-03-17: 50 mg via INTRAVENOUS
  Filled 2020-03-17: qty 1

## 2020-03-17 MED ORDER — METHYLPREDNISOLONE SODIUM SUCC 125 MG IJ SOLR: 125.0000 mg | Freq: Once | INTRAMUSCULAR | Status: AC | PRN

## 2020-03-17 MED ORDER — ACETAMINOPHEN 325 MG PO TABS: 325.0000 mg | ORAL_TABLET | Freq: Once | ORAL | Status: DC

## 2020-03-17 MED ORDER — SODIUM CHLORIDE 0.9 % IV SOLN: INTRAVENOUS | Status: DC

## 2020-03-17 MED ORDER — METHYLPREDNISOLONE SODIUM SUCC 125 MG IJ SOLR
INTRAMUSCULAR | Status: AC
  Administered 2020-03-17: 125 mg via INTRAVENOUS
  Filled 2020-03-17: qty 2

## 2020-03-17 SURGICAL SUPPLY — 16 items
BALLN LUTONIX 5X220X130 (BALLOONS) ×2
BALLN LUTONIX 6X220X130 (BALLOONS) ×2
BALLOON LUTONIX 5X220X130 (BALLOONS) ×1 IMPLANT
BALLOON LUTONIX 6X220X130 (BALLOONS) ×1 IMPLANT
CATH ANGIO 5F PIGTAIL 65CM (CATHETERS) ×2 IMPLANT
CATH BEACON 5 .038 100 VERT TP (CATHETERS) ×2 IMPLANT
DEVICE PRESTO INFLATION (MISCELLANEOUS) ×2 IMPLANT
DEVICE STARCLOSE SE CLOSURE (Vascular Products) ×2 IMPLANT
GLIDEWIRE ADV .035X260CM (WIRE) ×2 IMPLANT
LIFESTENT SOLO 6X200X135 (Permanent Stent) ×2 IMPLANT
PACK ANGIOGRAPHY (CUSTOM PROCEDURE TRAY) ×2 IMPLANT
SHEATH ANL2 6FRX45 HC (SHEATH) ×2 IMPLANT
SHEATH BRITE TIP 5FRX11 (SHEATH) ×2 IMPLANT
SYR MEDRAD MARK 7 150ML (SYRINGE) ×2 IMPLANT
TUBING CONTRAST HIGH PRESS 72 (TUBING) ×2 IMPLANT
WIRE J 3MM .035X145CM (WIRE) ×2 IMPLANT

## 2020-03-17 NOTE — Interval H&P Note (Signed)
History and Physical Interval Note:  03/17/2020 8:20 AM  Aaron [name]  has presented today for surgery, with the diagnosis of LT lower extremity angio    BARD   ASO w claudication Covid  Sept 7.  The various methods of treatment have been discussed with the patient and family. After consideration of risks, benefits and other options for treatment, the patient has consented to  Procedure(s): LOWER EXTREMITY ANGIOGRAPHY (Left) as a surgical intervention.  The patient's history has been reviewed, patient examined, no change in status, stable for surgery.  I have reviewed the patient's chart and labs.  Questions were answered to the patient's satisfaction.     [name]

## 2020-03-17 NOTE — Discharge Instructions (Signed)
Femoral Site Care This sheet gives you information about how to care for yourself after your procedure. Your health care provider may also give you more specific instructions. If you have problems or questions, contact your health care provider. What can I expect after the procedure? After the procedure, it is common to have:  Bruising that usually fades within 1-2 weeks.  Tenderness at the site. Follow these instructions at home: Wound care  Follow instructions from your health care provider about how to take care of your insertion site. Make sure you: ? Wash your hands with soap and water before you change your bandage (dressing). If soap and water are not available, use hand sanitizer. ? Change your dressing as told by your health care provider. ? Leave stitches (sutures), skin glue, or adhesive strips in place. These skin closures may need to stay in place for 2 weeks or longer. If adhesive strip edges start to loosen and curl up, you may trim the loose edges. Do not remove adhesive strips completely unless your health care provider tells you to do that.  Do not take baths, swim, or use a hot tub until your health care provider approves.  You may shower 24-48 hours after the procedure or as told by your health care provider. ? Gently wash the site with plain soap and water. ? Pat the area dry with a clean towel. ? Do not rub the site. This may cause bleeding.  Do not apply powder or lotion to the site. Keep the site clean and dry.  Check your femoral site every day for signs of infection. Check for: ? Redness, swelling, or pain. ? Fluid or blood. ? Warmth. ? Pus or a bad smell. Activity  For the first 2-3 days after your procedure, or as long as directed: ? Avoid climbing stairs as much as possible. ? Do not squat.  Do not lift anything that is heavier than 10 lb (4.5 kg), or the limit that you are told, until your health care provider says that it is safe.  Rest as  directed. ? Avoid sitting for a long time without moving. Get up to take short walks every 1-2 hours.  Do not drive for 24 hours if you were given a medicine to help you relax (sedative). General instructions  Take over-the-counter and prescription medicines only as told by your health care provider.  Keep all follow-up visits as told by your health care provider. This is important. Contact a health care provider if you have:  A fever or chills.  You have redness, swelling, or pain around your insertion site. Get help right away if:  The catheter insertion area swells very fast.  You pass out.  You suddenly start to sweat or your skin gets clammy.  The catheter insertion area is bleeding, and the bleeding does not stop when you hold steady pressure on the area.  The area near or just beyond the catheter insertion site becomes pale, cool, tingly, or numb. These symptoms may represent a serious problem that is an emergency. Do not wait to see if the symptoms will go away. Get medical help right away. Call your local emergency services (911 in the [place] Do not drive yourself to the hospital. Summary  After the procedure, it is common to have bruising that usually fades within 1-2 weeks.  Check your femoral site every day for signs of infection.  Do not lift anything that is heavier than 10 lb (4.5 kg), or the   limit that you are told, until your health care provider says that it is safe. This information is not intended to replace advice given to you by your health care provider. Make sure you discuss any questions you have with your health care provider. Document Revised: 07/08/2017 Document Reviewed: 07/08/2017 Elsevier Patient Education  2020 [employer]. Moderate Conscious Sedation, Adult, Care After These instructions provide you with information about caring for yourself after your procedure. Your health care provider may also give you more specific instructions. Your  treatment has been planned according to current medical practices, but problems sometimes occur. Call your health care provider if you have any problems or questions after your procedure. What can I expect after the procedure? After your procedure, it is common:  To feel sleepy for several hours.  To feel clumsy and have poor balance for several hours.  To have poor judgment for several hours.  To vomit if you eat too soon. Follow these instructions at home: For at least 24 hours after the procedure:   Do not: ? Participate in activities where you could fall or become injured. ? Drive. ? Use heavy machinery. ? Drink alcohol. ? Take sleeping pills or medicines that cause drowsiness. ? Make important decisions or sign legal documents. ? Take care of children on your own.  Rest. Eating and drinking  Follow the diet recommended by your health care provider.  If you vomit: ? Drink water, juice, or soup when you can drink without vomiting. ? Make sure you have little or no nausea before eating solid foods. General instructions  Have a responsible adult stay with you until you are awake and alert.  Take over-the-counter and prescription medicines only as told by your health care provider.  If you smoke, do not smoke without supervision.  Keep all follow-up visits as told by your health care provider. This is important. Contact a health care provider if:  You keep feeling nauseous or you keep vomiting.  You feel light-headed.  You develop a rash.  You have a fever. Get help right away if:  You have trouble breathing. This information is not intended to replace advice given to you by your health care provider. Make sure you discuss any questions you have with your health care provider. Document Revised: 06/07/2017 Document Reviewed: 10/15/2015 Elsevier Patient Education  2020 [employer] Inc. Angiogram, Care After This sheet gives you information about how to care for  yourself after your procedure. Your doctor may also give you more specific instructions. If you have problems or questions, contact your doctor. Follow these instructions at home: Insertion site care  Follow instructions from your doctor about how to take care of your long, thin tube (catheter) insertion area. Make sure you: ? Wash your hands with soap and water before you change your bandage (dressing). If you cannot use soap and water, use hand sanitizer. ? Change your bandage as told by your doctor. ? Leave stitches (sutures), skin glue, or skin tape (adhesive) strips in place. They may need to stay in place for 2 weeks or longer. If tape strips get loose and curl up, you may trim the loose edges. Do not remove tape strips completely unless your doctor says it is okay.  Do not take baths, swim, or use a hot tub until your doctor says it is okay.  You may shower 24-48 hours after the procedure or as told by your doctor. ? Gently wash the area with plain soap and water. ?   Pat the area dry with a clean towel. ? Do not rub the area. This may cause bleeding.  Do not apply powder or lotion to the area. Keep the area clean and dry.  Check your insertion area every day for signs of infection. Check for: ? More redness, swelling, or pain. ? Fluid or blood. ? Warmth. ? Pus or a bad smell. Activity  Rest as told by your doctor, usually for 1-2 days.  Do not lift anything that is heavier than 10 lbs. (4.5 kg) or as told by your doctor.  Do not drive for 24 hours if you were given a medicine to help you relax (sedative).  Do not drive or use heavy machinery while taking prescription pain medicine. General instructions   Go back to your normal activities as told by your doctor, usually in about a week. Ask your doctor what activities are safe for you.  If the insertion area starts to bleed, lie flat and put pressure on the area. If the bleeding does not stop, get help right away. This is an  emergency.  Drink enough fluid to keep your pee (urine) clear or pale yellow.  Take over-the-counter and prescription medicines only as told by your doctor.  Keep all follow-up visits as told by your doctor. This is important. Contact a doctor if:  You have a fever.  You have chills.  You have more redness, swelling, or pain around your insertion area.  You have fluid or blood coming from your insertion area.  The insertion area feels warm to the touch.  You have pus or a bad smell coming from your insertion area.  You have more bruising around the insertion area.  Blood collects in the tissue around the insertion area (hematoma) that may be painful to the touch. Get help right away if:  You have a lot of pain in the insertion area.  The insertion area swells very fast.  The insertion area is bleeding, and the bleeding does not stop after holding steady pressure on the area.  The area near or just beyond the insertion area becomes pale, cool, tingly, or numb. These symptoms may be an emergency. Do not wait to see if the symptoms will go away. Get medical help right away. Call your local emergency services (911 in the [place] Do not drive yourself to the hospital. Summary  After the procedure, it is common to have bruising and tenderness at the long, thin tube insertion area.  After the procedure, it is important to rest and drink plenty of fluids.  Do not take baths, swim, or use a hot tub until your doctor says it is okay to do so. You may shower 24-48 hours after the procedure or as told by your doctor.  If the insertion area starts to bleed, lie flat and put pressure on the area. If the bleeding does not stop, get help right away. This is an emergency. This information is not intended to replace advice given to you by your health care provider. Make sure you discuss any questions you have with your health care provider. Document Revised: 06/07/2017 Document Reviewed:  06/19/2016 Elsevier Patient Education  2020 Elsevier Inc.  

## 2020-03-17 NOTE — Op Note (Signed)
[facility]  Percutaneous Study/Intervention Procedural Note   Date of Surgery: 03/17/2020  Surgeon(s):Adithi Gammon    Assistants:none  Pre-operative Diagnosis: PAD with claudication left lower extremity  Post-operative diagnosis:  Same  Procedure(s) Performed:             1.  Ultrasound guidance for vascular access right femoral artery             2.  Catheter placement into left common femoral artery from right femoral approach             3.  Aortogram and selective left lower extremity angiogram             4.  Percutaneous transluminal angioplasty of the left mid to distal SFA and above-knee popliteal artery with 5 mm diameter by 22 cm length Lutonix drug-coated angioplasty balloon             5.  Placement of a stent to the left SFA and proximal popliteal artery with 6 mm diameter by 20 cm length life stent  6.  StarClose closure device right femoral artery  EBL: 5 cc  Contrast: 50 cc  Fluoro Time: 5.2 minutes  Moderate Conscious Sedation Time: approximately 32 minutes using 2 mg of Versed and 50 mcg of Fentanyl              Indications:  Patient is a 64 y.o.male with worsening claudication symptoms of the left leg. The patient has noninvasive study showing a reduction in the left ABI.  Right ABI was normal.  He had undergone previous aortoiliac intervention. The patient is brought in for angiography for further evaluation and potential treatment.  Risks and benefits are discussed and informed consent is obtained.   Procedure:  The patient was identified and appropriate procedural time out was performed.  The patient was then placed supine on the table and prepped and draped in the usual sterile fashion. Moderate conscious sedation was administered during a face to face encounter with the patient throughout the procedure with my supervision of the RN administering medicines and monitoring the patient's vital signs, pulse oximetry, telemetry and mental status  throughout from the start of the procedure until the patient was taken to the recovery room. Ultrasound was used to evaluate the right common femoral artery.  It was patent .  A digital ultrasound image was acquired.  A Seldinger needle was used to access the right common femoral artery under direct ultrasound guidance and a permanent image was performed.  A 0.035 J wire was advanced without resistance and a 5Fr sheath was placed.  Pigtail catheter was placed into the aorta and an AP aortogram was performed. This demonstrated what appeared to be at least moderate disease of both renal arteries.  The aorta was patent.  The previously placed iliac stents were patent without significant recurrent stenosis.  There was a large calcific chunk in the left common iliac artery above the previously placed stent that created about a 30 to 40% stenosis.  There is also mild stenosis in the right external iliac artery below the previously placed stents but this did not appear flow-limiting. I then crossed the aortic bifurcation and advanced to the left femoral head. Selective left lower extremity angiogram was then performed. This demonstrated what appeared to be at least moderate disease of both renal arteries.  The aorta was patent.  The previously placed iliac stents were patent without significant recurrent stenosis.  There was a large calcific chunk  in the left common iliac artery above the previously placed stent that created about a 30 to 40% stenosis.  There is also mild stenosis in the right external iliac artery below the previously placed stents but this did not appear flow-limiting. It was felt that it was in the patient's best interest to proceed with intervention after these images to avoid a second procedure and a larger amount of contrast and fluoroscopy based off of the findings from the initial angiogram. The patient was systemically heparinized and a 6 [place] [employer] sheath was then placed over the [employer] wire. I then used a Kumpe catheter and the advantage wire to navigate through the SFA and popliteal disease down into the below-knee popliteal artery where selective imaging demonstrated intraluminal flow and helped opacify the tibial vessels better.  I then repeat laced the advantage wire.  A 5 mm diameter by 22 cm length Lutonix drug-coated angioplasty balloon was inflated to 8 atm for 1 minute in the mid to distal SFA and proximal popliteal artery.  Completion imaging showed multiple areas of greater than 50% residual stenosis in the highly calcific lesion.  I elected to stent this area.  A 6 mm diameter by 20 cm length life stent was deployed in the mid to distal SFA and proximal popliteal artery and then postdilated with 6 mm diameter Lutonix drug-coated balloon with excellent angiographic completion result and less than 20% residual stenosis. I elected to terminate the procedure. The sheath was removed and StarClose closure device was deployed in the right femoral artery with excellent hemostatic result. The patient was taken to the recovery room in stable condition having tolerated the procedure well.  Findings:               Aortogram:  This demonstrated what appeared to be at least moderate disease of both renal arteries.  The aorta was patent.  The previously placed iliac stents were patent without significant recurrent stenosis.  There was a large calcific chunk in the left common iliac artery above the previously placed stent that created about a 30 to 40% stenosis.  There is also mild stenosis in the right external iliac artery below the previously placed stents but this did not appear flow-limiting             Left lower Extremity: Calcific but not particular stenotic left common femoral artery.  The profunda femoris artery was small and heavily diseased providing poor collateral flow down the leg.  The left SFA was calcific and in the midsegment began to have diffuse disease throughout  the mid and distal SFA and the proximal popliteal artery.  There were multiple areas and there is greater than 70% stenosis.  The below-knee popliteal artery normalized.  There was mild stenosis in the tibioperoneal trunk that did not appear flow-limiting.  The posterior tibial and anterior tibial arteries were patent without focal stenosis visualized although flow was somewhat slowed distally.   Disposition: Patient was taken to the recovery room in stable condition having tolerated the procedure well.  Complications: None  [name] 03/17/2020 10:36 AM   This note was created with Dragon Medical transcription system. Any errors in dictation are purely unintentional.

## 2020-03-18 ENCOUNTER — Telehealth (INDEPENDENT_AMBULATORY_CARE_PROVIDER_SITE_OTHER): Payer: Self-pay

## 2020-03-18 ENCOUNTER — Encounter: Payer: Self-pay | Admitting: Vascular Surgery

## 2020-03-18 NOTE — Telephone Encounter (Signed)
Patient called stating that his leg at the incision site is purple going down the leg. Patient stated he did not have any other symptoms or pain. I advised that due to him having the procedure on 03/17/20 it most likely is bruising but if it gets worse or he starts having other symptoms to call our office.

## 2020-03-20 DIAGNOSIS — J42 Unspecified chronic bronchitis: Secondary | ICD-10-CM | POA: Diagnosis not present

## 2020-03-21 ENCOUNTER — Telehealth (INDEPENDENT_AMBULATORY_CARE_PROVIDER_SITE_OTHER): Payer: Self-pay

## 2020-03-21 NOTE — Telephone Encounter (Signed)
Patient called on 03/18/20 stating he had purple coloring going down his  leg after the procedure on 03/17/20 with Dr. Lucky Cowboy ( patient had a LLE angiogram). I advised that it is most likely bruising as the patient stated he had no other symptoms. Patient has called back today to state he has pain going down his left leg from his thigh to his ankle, no swelling or redness and his other leg (right) has very dark purple coloring. Please advise.

## 2020-03-21 NOTE — Telephone Encounter (Signed)
Can you specify what type of duplex please. Thank you

## 2020-03-21 NOTE — Telephone Encounter (Signed)
See notes below. Thank you

## 2020-03-22 ENCOUNTER — Other Ambulatory Visit (INDEPENDENT_AMBULATORY_CARE_PROVIDER_SITE_OTHER): Payer: Self-pay | Admitting: Vascular Surgery

## 2020-03-22 DIAGNOSIS — Z95820 Peripheral vascular angioplasty status with implants and grafts: Secondary | ICD-10-CM

## 2020-03-22 DIAGNOSIS — I70219 Atherosclerosis of native arteries of extremities with intermittent claudication, unspecified extremity: Secondary | ICD-10-CM

## 2020-03-22 DIAGNOSIS — L819 Disorder of pigmentation, unspecified: Secondary | ICD-10-CM

## 2020-03-22 NOTE — Telephone Encounter (Signed)
Please schedule patient to come in for a arterial limited (femoral) ultrasound. Thank you

## 2020-03-24 ENCOUNTER — Encounter (INDEPENDENT_AMBULATORY_CARE_PROVIDER_SITE_OTHER): Payer: Self-pay | Admitting: Nurse Practitioner

## 2020-03-24 ENCOUNTER — Ambulatory Visit (INDEPENDENT_AMBULATORY_CARE_PROVIDER_SITE_OTHER): Payer: PPO

## 2020-03-24 ENCOUNTER — Ambulatory Visit (INDEPENDENT_AMBULATORY_CARE_PROVIDER_SITE_OTHER): Payer: PPO | Admitting: Nurse Practitioner

## 2020-03-24 ENCOUNTER — Other Ambulatory Visit: Payer: Self-pay

## 2020-03-24 VITALS — BP 117/68 | HR 67 | Resp 18 | Ht 72.0 in | Wt 199.0 lb

## 2020-03-24 DIAGNOSIS — E782 Mixed hyperlipidemia: Secondary | ICD-10-CM

## 2020-03-24 DIAGNOSIS — F17200 Nicotine dependence, unspecified, uncomplicated: Secondary | ICD-10-CM

## 2020-03-24 DIAGNOSIS — Z95820 Peripheral vascular angioplasty status with implants and grafts: Secondary | ICD-10-CM | POA: Diagnosis not present

## 2020-03-24 DIAGNOSIS — I70219 Atherosclerosis of native arteries of extremities with intermittent claudication, unspecified extremity: Secondary | ICD-10-CM

## 2020-03-24 DIAGNOSIS — I739 Peripheral vascular disease, unspecified: Secondary | ICD-10-CM | POA: Diagnosis not present

## 2020-03-24 DIAGNOSIS — L819 Disorder of pigmentation, unspecified: Secondary | ICD-10-CM

## 2020-03-24 MED ORDER — TRAMADOL HCL 50 MG PO TABS: 50.0000 mg | ORAL_TABLET | Freq: Four times a day (QID) | ORAL | 0 refills | Status: AC | PRN

## 2020-03-29 ENCOUNTER — Encounter (INDEPENDENT_AMBULATORY_CARE_PROVIDER_SITE_OTHER): Payer: Self-pay | Admitting: Nurse Practitioner

## 2020-03-29 NOTE — Progress Notes (Signed)
Subjective:    Patient ID: Aaron [name], male    DOB: 10-20-1955, 64 y.o.   MRN: [id number] Chief Complaint  Patient presents with  . Follow-up    The patient returns to the office for followup and review status post angiogram with intervention. The patient notes improvement in the lower extremity symptoms. No interval shortening of the patient's claudication distance or rest pain symptoms. Previous wounds have now healed.  No new ulcers or wounds have occurred since the last visit.  The patient has noted some discomfort in his leg since his recent procedure.  He notes that the pain started following the procedure along the medial portion of his thigh.  He describes it more as an aching throbbing pain.  There have been no significant changes to the patient's overall health care.  The patient denies amaurosis fugax or recent TIA symptoms. There are no recent neurological changes noted. The patient denies history of DVT, PE or superficial thrombophlebitis. The patient denies recent episodes of angina or shortness of breath.   ABI's Rt= 1.06 and Lt= 1.07 (previous ABI's Rt= 1.03 and Lt= 0.83) Duplex US of the bilateral tibial arteries reveals triphasic waveforms with good toe waveforms bilaterally.  Bilateral carotid ultrasound also demonstrated a normal common femoral artery flow with no evidence of aneurysm or pseudoaneurysm identified bilaterally.   Review of Systems  Musculoskeletal: Positive for myalgias.  All other systems reviewed and are negative.      Objective:   Physical Exam Vitals reviewed.  HENT:     Head: Normocephalic.  Cardiovascular:     Rate and Rhythm: Normal rate and regular rhythm.     Pulses: Normal pulses.     Heart sounds: Normal heart sounds.  Pulmonary:     Effort: Pulmonary effort is normal.  Skin:    General: Skin is warm and dry.  Neurological:     Mental Status: He is alert and oriented to person, place, and time.  Psychiatric:        Mood  and Affect: Mood normal.        Behavior: Behavior normal.        Thought Content: Thought content normal.        Judgment: Judgment normal.     BP 117/68 (BP Location: Right Arm)   Pulse 67   Resp 18   Ht 6' (1.829 m)   Wt 199 lb (90.3 kg)   BMI 26.99 kg/m   Past Medical History:  Diagnosis Date  . COPD (chronic obstructive pulmonary disease) ([place])   . Coronary artery disease    s/p PCI  . Fracture of left ankle, closed, initial encounter 10/23/2017  . GERD (gastroesophageal reflux disease)   . Glaucoma   . Gout   . Hx of heart artery stent 01/12/2017  . Hypercholesteremia   . Hypertension   . PAD (peripheral artery disease) ([place])   . Pain in limb 07/13/2016  . Syncope 09/16/2017  . Unstable angina ([place]) 01/12/2017  . Vertigo     Social History   Socioeconomic History  . Marital status: Single    Spouse name: Not on file  . Number of children: 0  . Years of education: Not on file  . Highest education level: 12th grade  Occupational History  . Occupation: Retired/Disabled  Tobacco Use  . Smoking status: Current Some Day Smoker    Packs/day: 0.25    Types: Cigars    Start date: 09/03/1976  . Smokeless tobacco: Never Used  .  Tobacco comment: chantix   Vaping Use  . Vaping Use: Never used  Substance and Sexual Activity  . Alcohol use: Yes    Comment: quit drinking around 04/2019(May have drink 1x/mo)  . Drug use: No  . Sexual activity: Not Currently    Birth control/protection: None  Other Topics Concern  . Not on file  Social History Narrative   Independent at baseline   Social Determinants of Health   Financial Resource Strain:   . Difficulty of Paying Living Expenses: Not on file  Food Insecurity:   . Worried About Charity fundraiser in the Last Year: Not on file  . Ran Out of Food in the Last Year: Not on file  Transportation Needs:   . Lack of Transportation (Medical): Not on file  . Lack of Transportation (Non-Medical): Not on file  Physical  Activity:   . Days of Exercise per Week: Not on file  . Minutes of Exercise per Session: Not on file  Stress:   . Feeling of Stress : Not on file  Social Connections:   . Frequency of Communication with Friends and Family: Not on file  . Frequency of Social Gatherings with Friends and Family: Not on file  . Attends Religious Services: Not on file  . Active Member of Clubs or Organizations: Not on file  . Attends Archivist Meetings: Not on file  . Marital Status: Not on file  Intimate Partner Violence:   . Fear of Current or Ex-Partner: Not on file  . Emotionally Abused: Not on file  . Physically Abused: Not on file  . Sexually Abused: Not on file    Past Surgical History:  Procedure Laterality Date  . APPENDECTOMY    . cardiac stents   2012  . COLONOSCOPY  03/30/2013   [place]  . COLONOSCOPY WITH PROPOFOL N/A 12/10/2019   Procedure: COLONOSCOPY WITH PROPOFOL;  Surgeon: [name], MD;  Location: [facility];  Service: Endoscopy;  Laterality: N/A;  . COLONOSCOPY WITH PROPOFOL N/A 01/18/2020   Procedure: COLONOSCOPY WITH PROPOFOL;  Surgeon: [name], MD;  Location: [facility];  Service: Endoscopy;  Laterality: N/A;  4  . LOWER EXTREMITY ANGIOGRAPHY Right 09/25/2018   Procedure: LOWER EXTREMITY ANGIOGRAPHY;  Surgeon: Algernon Huxley, MD;  Location: [facility];  Service: Cardiovascular;  Laterality: Right;  . LOWER EXTREMITY ANGIOGRAPHY Left 03/17/2020   Procedure: LOWER EXTREMITY ANGIOGRAPHY;  Surgeon: Algernon Huxley, MD;  Location: [facility];  Service: Cardiovascular;  Laterality: Left;  . POLYPECTOMY N/A 01/18/2020   Procedure: POLYPECTOMY;  Surgeon: [name], MD;  Location: [facility];  Service: Endoscopy;  Laterality: N/A;    Family History  Problem Relation Age of Onset  . Hypertension Mother   . Diabetes Mother   . Heart failure Father        age 51  . Cancer Father   . Ovarian cancer Sister   . Heart disease  Brother     Allergies  Allergen Reactions  . Contrast Media [Iodinated Diagnostic Agents] Hives  . Iodine Hives       Assessment & Plan:   1. Status post angioplasty with stent Patient having some significant discomfort post angiogram.  No evidence of hematoma or pseudoaneurysm present.  We will send in a prescription for some pain medicine to assist with pain relief. - traMADol (ULTRAM) 50 MG tablet; Take 1 tablet (50 mg total) by mouth every 6 (six) hours as needed  for up to 7 days.  Dispense: 28 tablet; Refill: 0  2. Peripheral vascular disease ([place]) Recommend:  The patient is status post successful angiogram with intervention.  The patient reports that the claudication symptoms and leg pain is essentially gone.   The patient denies lifestyle limiting changes at this point in time.  No further invasive studies, angiography or surgery at this time The patient should continue walking and begin a more formal exercise program.  The patient should continue antiplatelet therapy and aggressive treatment of the lipid abnormalities  Smoking cessation was again discussed  The patient should continue wearing graduated compression socks 10-15 mmHg strength to control the mild edema.  Patient should undergo noninvasive studies as ordered. The patient will follow up with me after the studies.    3. Tobacco use disorder Smoking cessation was discussed, 3-10 minutes spent on this topic specifically   4. Mixed hyperlipidemia Continue statin as ordered and reviewed, no changes at this time    Current Outpatient Medications on File Prior to Visit  Medication Sig Dispense Refill  . albuterol (VENTOLIN HFA) 108 (90 Base) MCG/ACT inhaler Inhale 2 puffs into the lungs every 6 (six) hours as needed for wheezing or shortness of breath. 6.7 g 5  . aspirin EC 81 MG tablet Take 1 tablet (81 mg total) by mouth daily. 150 tablet 2  . CHANTIX 0.5 MG tablet Take 0.5 mg by mouth 2 (two) times daily.     . cilostazol (PLETAL) 50 MG tablet Take by mouth 2 (two) times daily.     . clopidogrel (PLAVIX) 75 MG tablet Take 1 tablet by mouth daily.    . clotrimazole-betamethasone (LOTRISONE) cream Apply 1 application topically 2 (two) times daily. To rash on hips and buttocks 45 g 0  . Fluticasone-Umeclidin-Vilant (TRELEGY ELLIPTA) 100-62.5-25 MCG/INH AEPB Inhale 1 puff into the lungs daily.     [name] GAVILYTE-C 240 g solution Drink one 8 oz glass every 20 mins until entire container is finished. 4000 mL 0  . indomethacin (INDOCIN) 25 MG capsule Take 1 capsule (25 mg total) by mouth 2 (two) times daily as needed. 30 capsule 1  . ipratropium (ATROVENT) 0.06 % nasal spray Place into both nostrils.     [name] ipratropium-albuterol (DUONEB) 0.5-2.5 (3) MG/3ML SOLN Take 3 mLs by nebulization QID.     [name] isosorbide mononitrate (IMDUR) 120 MG 24 hr tablet Take by mouth.    [name] lisinopril (PRINIVIL,ZESTRIL) 10 MG tablet Take 1 tablet by mouth daily.    . metoprolol tartrate (LOPRESSOR) 50 MG tablet Take 50-75 mg by mouth 2 (two) times a day. Take 50MG  by mouth every morning and 75MG  every evening    . montelukast (SINGULAIR) 10 MG tablet Take by mouth.     . montelukast (SINGULAIR) 10 MG tablet Take 10 mg by mouth daily.     . nitroGLYCERIN (NITROSTAT) 0.4 MG SL tablet Place 1 tablet under the tongue as needed.    . pantoprazole (PROTONIX) 40 MG tablet Take 1 tablet (40 mg total) by mouth daily. 30 tablet 3  . simvastatin (ZOCOR) 40 MG tablet Take 1 tablet (40 mg total) by mouth at bedtime. 30 tablet 5   No current facility-administered medications on file prior to visit.    There are no Patient Instructions on file for this visit. No follow-ups on file.   Kris Hartmann, NP

## 2020-04-05 ENCOUNTER — Telehealth (INDEPENDENT_AMBULATORY_CARE_PROVIDER_SITE_OTHER): Payer: Self-pay

## 2020-04-05 NOTE — Telephone Encounter (Signed)
The patient had good bloodflow and no aneurysm in his groin. All of his studies showed everything was fine. He shouldn't be having pain in his arm as we didn't intervene on the arm.  The fact that the pain is worse while sitting, makes me wonder if it's related to his back.  We can refill the tramadol if he needs a refill,  but he should contact his pcp to look into alternate causes.

## 2020-04-05 NOTE — Telephone Encounter (Signed)
Patient has made aware with medical advice and refill has being sent into pharmacy.

## 2020-04-14 ENCOUNTER — Ambulatory Visit (INDEPENDENT_AMBULATORY_CARE_PROVIDER_SITE_OTHER): Payer: PPO | Admitting: Nurse Practitioner

## 2020-04-14 ENCOUNTER — Ambulatory Visit (INDEPENDENT_AMBULATORY_CARE_PROVIDER_SITE_OTHER): Payer: PPO

## 2020-04-14 ENCOUNTER — Encounter (INDEPENDENT_AMBULATORY_CARE_PROVIDER_SITE_OTHER): Payer: PPO

## 2020-04-14 ENCOUNTER — Other Ambulatory Visit: Payer: Self-pay

## 2020-04-14 DIAGNOSIS — Z23 Encounter for immunization: Secondary | ICD-10-CM

## 2020-04-18 ENCOUNTER — Ambulatory Visit: Payer: PPO

## 2020-04-27 ENCOUNTER — Ambulatory Visit (INDEPENDENT_AMBULATORY_CARE_PROVIDER_SITE_OTHER): Payer: PPO

## 2020-04-27 ENCOUNTER — Other Ambulatory Visit: Payer: Self-pay

## 2020-04-27 DIAGNOSIS — Z0000 Encounter for general adult medical examination without abnormal findings: Secondary | ICD-10-CM

## 2020-04-27 NOTE — Progress Notes (Signed)
Subjective:   Aaron [name] is a 64 y.o. male who presents for Medicare Annual/Subsequent preventive examination.  Review of Systems     Cardiac Risk Factors include: hypertension;dyslipidemia;male gender;advanced age (>32men, >31 women);smoking/ tobacco exposure     Objective:    There were no vitals filed for this visit. There is no height or weight on file to calculate BMI.  Advanced Directives 04/27/2020 03/17/2020 01/18/2020 12/10/2019 11/09/2019 06/23/2019 04/27/2019  Does Patient Have a Medical Advance Directive? No No Yes No No No No  Type of Advance Directive - - [place]  Does patient want to make changes to medical advance directive? - - No - Patient declined - - - -  Copy of [place] in Chart? - - No - copy requested - - - -  Would patient like information on creating a medical advance directive? No - Patient declined - - Yes (MAU/Ambulatory/Procedural Areas - Information given) - - No - Patient declined    Current Medications (verified) Outpatient Encounter Medications as of 04/27/2020  Medication Sig  . albuterol (VENTOLIN HFA) 108 (90 Base) MCG/ACT inhaler Inhale 2 puffs into the lungs every 6 (six) hours as needed for wheezing or shortness of breath.  [name] aspirin EC 81 MG tablet Take 1 tablet (81 mg total) by mouth daily.  . CHANTIX 0.5 MG tablet Take 0.5 mg by mouth 2 (two) times daily.  . cilostazol (PLETAL) 50 MG tablet Take by mouth 2 (two) times daily.   . clopidogrel (PLAVIX) 75 MG tablet Take 1 tablet by mouth daily.  . indomethacin (INDOCIN) 25 MG capsule Take 1 capsule (25 mg total) by mouth 2 (two) times daily as needed.  . isosorbide mononitrate (IMDUR) 120 MG 24 hr tablet Take by mouth.  [name] lisinopril (PRINIVIL,ZESTRIL) 10 MG tablet Take 1 tablet by mouth daily.  . metoprolol tartrate (LOPRESSOR) 50 MG tablet Take 50-75 mg by mouth 2 (two) times a day. Take 50MG  by mouth every morning and 75MG  every evening  .  nitroGLYCERIN (NITROSTAT) 0.4 MG SL tablet Place 1 tablet under the tongue as needed.  . pantoprazole (PROTONIX) 40 MG tablet Take 1 tablet (40 mg total) by mouth daily.  . simvastatin (ZOCOR) 40 MG tablet Take 1 tablet (40 mg total) by mouth at bedtime.  . [DISCONTINUED] clotrimazole-betamethasone (LOTRISONE) cream Apply 1 application topically 2 (two) times daily. To rash on hips and buttocks  . [DISCONTINUED] Fluticasone-Umeclidin-Vilant (TRELEGY ELLIPTA) 100-62.5-25 MCG/INH AEPB Inhale 1 puff into the lungs daily.  (Patient not taking: Reported on 04/27/2020)  . [DISCONTINUED] GAVILYTE-C 240 g solution Drink one 8 oz glass every 20 mins until entire container is finished.  . [DISCONTINUED] ipratropium (ATROVENT) 0.06 % nasal spray Place into both nostrils.   . [DISCONTINUED] ipratropium-albuterol (DUONEB) 0.5-2.5 (3) MG/3ML SOLN Take 3 mLs by nebulization QID.   . [DISCONTINUED] montelukast (SINGULAIR) 10 MG tablet Take by mouth.   . [DISCONTINUED] montelukast (SINGULAIR) 10 MG tablet Take 10 mg by mouth daily.    No facility-administered encounter medications on file as of 04/27/2020.    Allergies (verified) Contrast media [iodinated diagnostic agents] and Iodine   History: Past Medical History:  Diagnosis Date  . COPD (chronic obstructive pulmonary disease) ([place])   . Coronary artery disease    s/p PCI  . Fracture of left ankle, closed, initial encounter 10/23/2017  . GERD (gastroesophageal reflux disease)   . Glaucoma   . Gout   .  Hx of heart artery stent 01/12/2017  . Hypercholesteremia   . Hypertension   . PAD (peripheral artery disease) ([place])   . Pain in limb 07/13/2016  . Syncope 09/16/2017  . Unstable angina ([place]) 01/12/2017  . Vertigo    Past Surgical History:  Procedure Laterality Date  . APPENDECTOMY    . cardiac stents   2012  . COLONOSCOPY  03/30/2013   [place]  . COLONOSCOPY WITH PROPOFOL N/A 12/10/2019   Procedure: COLONOSCOPY WITH PROPOFOL;  Surgeon: [name], MD;  Location: [facility];  Service: Endoscopy;  Laterality: N/A;  . COLONOSCOPY WITH PROPOFOL N/A 01/18/2020   Procedure: COLONOSCOPY WITH PROPOFOL;  Surgeon: [name], MD;  Location: [facility];  Service: Endoscopy;  Laterality: N/A;  4  . LOWER EXTREMITY ANGIOGRAPHY Right 09/25/2018   Procedure: LOWER EXTREMITY ANGIOGRAPHY;  Surgeon: Algernon Huxley, MD;  Location: [facility];  Service: Cardiovascular;  Laterality: Right;  . LOWER EXTREMITY ANGIOGRAPHY Left 03/17/2020   Procedure: LOWER EXTREMITY ANGIOGRAPHY;  Surgeon: Algernon Huxley, MD;  Location: [facility];  Service: Cardiovascular;  Laterality: Left;  . POLYPECTOMY N/A 01/18/2020   Procedure: POLYPECTOMY;  Surgeon: [name], MD;  Location: [facility];  Service: Endoscopy;  Laterality: N/A;   Family History  Problem Relation Age of Onset  . Hypertension Mother   . Diabetes Mother   . Heart failure Father        age 22  . Cancer Father   . Ovarian cancer Sister   . Heart disease Brother    Social History   Socioeconomic History  . Marital status: Single    Spouse name: Not on file  . Number of children: 0  . Years of education: Not on file  . Highest education level: 12th grade  Occupational History  . Occupation: Retired/Disabled  Tobacco Use  . Smoking status: Current Some Day Smoker    Packs/day: 0.50    Types: Cigarettes    Start date: 09/03/1976  . Smokeless tobacco: Never Used  . Tobacco comment: chantix   Vaping Use  . Vaping Use: Never used  Substance and Sexual Activity  . Alcohol use: Yes    Comment: quit drinking around 04/2019(May have drink 1x/mo)  . Drug use: No  . Sexual activity: Not Currently    Birth control/protection: None  Other Topics Concern  . Not on file  Social History Narrative   Independent at baseline. Lives alone. Does not drive.    Social Determinants of Health   Financial Resource Strain: Low Risk   . Difficulty of Paying  Living Expenses: Not hard at all  Food Insecurity: No Food Insecurity  . Worried About Charity fundraiser in the Last Year: Never true  . Ran Out of Food in the Last Year: Never true  Transportation Needs: No Transportation Needs  . Lack of Transportation (Medical): No  . Lack of Transportation (Non-Medical): No  Physical Activity: Sufficiently Active  . Days of Exercise per Week: 7 days  . Minutes of Exercise per Session: 40 min  Stress: No Stress Concern Present  . Feeling of Stress : Not at all  Social Connections: Socially Isolated  . Frequency of Communication with Friends and Family: More than three times a week  . Frequency of Social Gatherings with Friends and Family: More than three times a week  . Attends Religious Services: Never  . Active Member of Clubs or Organizations: No  . Attends Club  or Organization Meetings: Never  . Marital Status: Never married    Tobacco Counseling Ready to quit: Yes Counseling given: Yes Comment: chantix    Clinical Intake:  Pre-visit preparation completed: Yes  Pain : No/denies pain     Nutritional Risks: None Diabetes: No  How often do you need to have someone help you when you read instructions, pamphlets, or other written materials from your doctor or pharmacy?: 1 - Never    Interpreter Needed?: No  Information entered by :: Clemetine Marker LPN   Activities of Daily Living In your present state of health, do you have any difficulty performing the following activities: 04/27/2020 03/17/2020  Hearing? N N  Comment declines hearing aids -  Vision? N N  Difficulty concentrating or making decisions? N N  Walking or climbing stairs? N N  Dressing or bathing? N N  Doing errands, shopping? N -  Preparing Food and eating ? N -  Using the Toilet? N -  In the past six months, have you accidently leaked urine? N -  Do you have problems with loss of bowel control? N -  Managing your Medications? N -  Managing your Finances? N -    Housekeeping or managing your Housekeeping? N -  Some recent data might be hidden    Patient Care Team: Glean Hess, MD as PCP - General (Internal Medicine) Isaias Cowman, MD as PCP - Cardiology (Cardiology) Hessie Knows, MD as Consulting Physician (Orthopedic Surgery) Lucky Cowboy Erskine Squibb, MD as Referring Physician (Vascular Surgery) Ottie Glazier, MD as Consulting Physician (Pulmonary Disease) Minor, Dalbert Garnet, RN (Inactive) as [place] any recent [place] you may have received from other than [facility] providers in the past year (date may be approximate).     Assessment:   This is a routine wellness examination for [place].  Hearing/Vision screen  Hearing Screening   125Hz  250Hz  500Hz  1000Hz  2000Hz  3000Hz  4000Hz  6000Hz  8000Hz   Right ear:           Left ear:           Comments: Pt denies hearing difficulty  Vision Screening Comments: Annual [facility] screenings [facility]. Edison Pace  Dietary issues and exercise activities discussed: Current Exercise Habits: Home exercise routine, Type of exercise: walking, Time (Minutes): 40, Frequency (Times/Week): 7, Weekly Exercise (Minutes/Week): 280, Intensity: Mild, Exercise limited by: None identified  Goals    . DIET - INCREASE WATER INTAKE     Recommend to drink at least 6-8 8oz glasses of water per day.    . Quit Smoking     Pt states he would like to stop smoking this year      Depression Screen PHQ 2/9 Scores 04/27/2020 11/05/2019 07/21/2019 04/27/2019 02/20/2019 10/28/2018 09/03/2018  PHQ - 2 Score 0 0 0 0 0 0 0  PHQ- 9 Score - 0 0 - - - -    Fall Risk Fall Risk  04/27/2020 11/05/2019 04/27/2019 10/28/2018 09/03/2018  Falls in the past year? 0 0 0 0 0  Number falls in past yr: 0 0 0 0 0  Injury with Fall? 0 0 0 0 0  Comment - - - - -  Risk for fall due to : History of fall(s) No Fall Risks - - -  Follow up Falls prevention discussed Falls evaluation completed  Falls prevention discussed Falls evaluation completed -    Any stairs in or around the home? No If so, are  there any without handrails? No  Home free of loose throw rugs in walkways, pet beds, electrical cords, etc? Yes  Adequate lighting in your home to reduce risk of falls? Yes   ASSISTIVE DEVICES UTILIZED TO PREVENT FALLS:  Life alert? No  Use of a cane, walker or w/c? No  Grab bars in the bathroom? No  Shower chair or bench in shower? No  Elevated toilet seat or a handicapped toilet? No   TIMED UP AND GO:  Was the test performed? Yes .  Length of time to ambulate 10 feet: 5 sec.   Gait steady and fast without use of assistive device  Cognitive Function:     6CIT Screen 04/27/2020 04/27/2019 10/17/2017  What Year? 0 points 0 points 0 points  What month? 0 points 0 points 0 points  What time? 0 points 0 points 0 points  Count back from 20 0 points 0 points 0 points  Months in reverse 0 points 0 points 0 points  Repeat phrase 0 points 0 points 2 points  Total Score 0 0 2    Immunizations Immunization History  Administered Date(s) Administered  . Influenza,inj,Quad PF,6+ Mos 05/19/2015, 04/06/2016, 04/02/2017, 03/24/2018, 04/01/2019, 04/14/2020  . PFIZER SARS-COV-2 Vaccination 08/25/2019, 09/15/2019  . Pneumococcal Polysaccharide-23 11/14/2012, 07/03/2014  . Tdap 12/18/2013    TDAP status: Up to date   Flu Vaccine status: Up to date   Pneumococcal vaccine status: Up to date   Covid-19 vaccine status: Completed vaccines  Qualifies for Shingles Vaccine? Yes   Zostavax completed No   Shingrix Completed?: No.    Education has been provided regarding the importance of this vaccine. Patient has been advised to call insurance company to determine out of pocket expense if they have not yet received this vaccine. Advised may also receive vaccine at local pharmacy or Health Dept. Verbalized acceptance and understanding.  Screening Tests Health Maintenance  Topic Date  Due  . TETANUS/TDAP  12/19/2023  . COLONOSCOPY  01/17/2025  . INFLUENZA VACCINE  Completed  . COVID-19 Vaccine  Completed  . Hepatitis C Screening  Completed  . HIV Screening  Completed    Health Maintenance  There are no preventive care reminders to display for this patient.  Colorectal cancer screening: Completed 01/18/20. Repeat every 5 years  Lung Cancer Screening: (Low Dose CT Chest recommended if Age 39-80 years, 30 pack-year currently smoking OR have quit w/in 15years.) does qualify.   Lung Cancer Screening Referral: An Epic message has been sent to Burgess Estelle, RN (Oncology Nurse Navigator) regarding the possible need for this exam. Raquel Sarna will review the patient's chart to determine if the patient truly qualifies for the exam. If the patient qualifies, Raquel Sarna will order the Low Dose CT of the chest to facilitate the scheduling of this exam.  Additional Screening:  Hepatitis C Screening: does qualify; Completed 05/19/15  Vision Screening: Recommended annual ophthalmology exams for early detection of glaucoma and other disorders of the eye. Is the patient up to date with their annual eye exam?  Yes  Who is the provider or what is the name of the office in which the patient attends annual eye exams? [place] Screening: Recommended annual dental exams for proper oral hygiene  Community Resource Referral / Chronic Care Management: CRR required this visit?  No   CCM required this visit?  No      Plan:     I have personally reviewed and noted the following in the patient's  chart:   . Medical and social history . Use of alcohol, tobacco or illicit drugs  . Current medications and supplements . Functional ability and status . Nutritional status . Physical activity . Advanced directives . List of other physicians . Hospitalizations, surgeries, and ER visits in previous 12 months . Vitals . Screenings to include cognitive, depression, and  falls . Referrals and appointments  In addition, I have reviewed and discussed with patient certain preventive protocols, quality metrics, and best practice recommendations. A written personalized care plan for preventive services as well as general preventive health recommendations were provided to patient.     Clemetine Marker, LPN   [id number]   Nurse Notes: none

## 2020-04-27 NOTE — Patient Instructions (Signed)
[name] , Thank you for taking time to come for your Medicare Wellness Visit. I appreciate your ongoing commitment to your health goals. Please review the following plan we discussed and let me know if I can assist you in the future.   Screening recommendations/referrals: Colonoscopy: done 01/18/20. Repeat in 2026 Recommended yearly ophthalmology/optometry visit for glaucoma screening and checkup Recommended yearly dental visit for hygiene and checkup  Vaccinations: Influenza vaccine: done 04/14/20 Pneumococcal vaccine: done 07/03/14 Tdap vaccine: done 12/18/13 Shingles vaccine: Shingrix discussed. Please contact your pharmacy for coverage information.  Covid-19: done 08/25/19 7 09/15/19  Advanced directives: Please bring a copy of your health care power of attorney and living will to the office at your convenience once you have completed those documents.   Conditions/risks identified: If you wish to quit smoking, help is available. For free tobacco cessation program offerings call the [facility] at [phone] or Live Well Line at [phone]. You may also visit [email or web address] or email [email or web address] for more information on other programs.   Next appointment: Follow up in one year for your annual wellness visit   Preventive Care 64-64 Years, Male Preventive care refers to lifestyle choices and visits with your health care provider that can promote health and wellness. What does preventive care include?  A yearly physical exam. This is also called an annual well check.  Dental exams once or twice a year.  Routine eye exams. Ask your health care provider how often you should have your eyes checked.  Personal lifestyle choices, including:  Daily care of your teeth and gums.  Regular physical activity.  Eating a healthy diet.  Avoiding tobacco and drug use.  Limiting alcohol use.  Practicing safe sex.  Taking low-dose aspirin every day  starting at age 64. What happens during an annual well check? The services and screenings done by your health care provider during your annual well check will depend on your age, overall health, lifestyle risk factors, and family history of disease. Counseling  Your health care provider may ask you questions about your:  Alcohol use.  Tobacco use.  Drug use.  Emotional well-being.  Home and relationship well-being.  Sexual activity.  Eating habits.  Work and work Statistician. Screening  You may have the following tests or measurements:  Height, weight, and BMI.  Blood pressure.  Lipid and cholesterol levels. These may be checked every 5 years, or more frequently if you are over 45 years old.  Skin check.  Lung cancer screening. You may have this screening every year starting at age 28 if you have a 30-pack-year history of smoking and currently smoke or have quit within the past 15 years.  Fecal occult blood test (FOBT) of the stool. You may have this test every year starting at age 19.  Flexible sigmoidoscopy or colonoscopy. You may have a sigmoidoscopy every 5 years or a colonoscopy every 10 years starting at age 74.  Prostate cancer screening. Recommendations will vary depending on your family history and other risks.  Hepatitis C blood test.  Hepatitis B blood test.  Sexually transmitted disease (STD) testing.  Diabetes screening. This is done by checking your blood sugar (glucose) after you have not eaten for a while (fasting). You may have this done every 1-3 years. Discuss your test results, treatment options, and if necessary, the need for more tests with your health care provider. Vaccines  Your health care provider may recommend certain vaccines, such as:  Influenza vaccine.  This is recommended every year.  Tetanus, diphtheria, and acellular pertussis (Tdap, Td) vaccine. You may need a Td booster every 10 years.  Zoster vaccine. You may need this after  age 74.  Pneumococcal 13-valent conjugate (PCV13) vaccine. You may need this if you have certain conditions and have not been vaccinated.  Pneumococcal polysaccharide (PPSV23) vaccine. You may need one or two doses if you smoke cigarettes or if you have certain conditions. Talk to your health care provider about which screenings and vaccines you need and how often you need them. This information is not intended to replace advice given to you by your health care provider. Make sure you discuss any questions you have with your health care provider. Document Released: 07/22/2015 Document Revised: 03/14/2016 Document Reviewed: 04/26/2015 Elsevier Interactive Patient Education  2017 [place] Prevention in the Home Falls can cause injuries. They can happen to people of all ages. There are many things you can do to make your home safe and to help prevent falls. What can I do on the outside of my home?  Regularly fix the edges of walkways and driveways and fix any cracks.  Remove anything that might make you trip as you walk through a door, such as a raised step or threshold.  Trim any bushes or trees on the path to your home.  Use bright outdoor lighting.  Clear any walking paths of anything that might make someone trip, such as rocks or tools.  Regularly check to see if handrails are loose or broken. Make sure that both sides of any steps have handrails.  Any raised decks and porches should have guardrails on the edges.  Have any leaves, snow, or ice cleared regularly.  Use sand or salt on walking paths during winter.  Clean up any spills in your garage right away. This includes oil or grease spills. What can I do in the bathroom?  Use night lights.  Install grab bars by the toilet and in the tub and shower. Do not use towel bars as grab bars.  Use non-skid mats or decals in the tub or shower.  If you need to sit down in the shower, use a plastic, non-slip  stool.  Keep the floor dry. Clean up any water that spills on the floor as soon as it happens.  Remove soap buildup in the tub or shower regularly.  Attach bath mats securely with double-sided non-slip rug tape.  Do not have throw rugs and other things on the floor that can make you trip. What can I do in the bedroom?  Use night lights.  Make sure that you have a light by your bed that is easy to reach.  Do not use any sheets or blankets that are too big for your bed. They should not hang down onto the floor.  Have a firm chair that has side arms. You can use this for support while you get dressed.  Do not have throw rugs and other things on the floor that can make you trip. What can I do in the kitchen?  Clean up any spills right away.  Avoid walking on wet floors.  Keep items that you use a lot in easy-to-reach places.  If you need to reach something above you, use a strong step stool that has a grab bar.  Keep electrical cords out of the way.  Do not use floor polish or wax that makes floors slippery. If you must use wax, use non-skid  floor wax.  Do not have throw rugs and other things on the floor that can make you trip. What can I do with my stairs?  Do not leave any items on the stairs.  Make sure that there are handrails on both sides of the stairs and use them. Fix handrails that are broken or loose. Make sure that handrails are as long as the stairways.  Check any carpeting to make sure that it is firmly attached to the stairs. Fix any carpet that is loose or worn.  Avoid having throw rugs at the top or bottom of the stairs. If you do have throw rugs, attach them to the floor with carpet tape.  Make sure that you have a light switch at the top of the stairs and the bottom of the stairs. If you do not have them, ask someone to add them for you. What else can I do to help prevent falls?  Wear shoes that:  Do not have high heels.  Have rubber bottoms.  Are  comfortable and fit you well.  Are closed at the toe. Do not wear sandals.  If you use a stepladder:  Make sure that it is fully opened. Do not climb a closed stepladder.  Make sure that both sides of the stepladder are locked into place.  Ask someone to hold it for you, if possible.  Clearly mark and make sure that you can see:  Any grab bars or handrails.  First and last steps.  Where the edge of each step is.  Use tools that help you move around (mobility aids) if they are needed. These include:  Canes.  Walkers.  Scooters.  Crutches.  Turn on the lights when you go into a dark area. Replace any light bulbs as soon as they burn out.  Set up your furniture so you have a clear path. Avoid moving your furniture around.  If any of your floors are uneven, fix them.  If there are any pets around you, be aware of where they are.  Review your medicines with your doctor. Some medicines can make you feel dizzy. This can increase your chance of falling. Ask your doctor what other things that you can do to help prevent falls. This information is not intended to replace advice given to you by your health care provider. Make sure you discuss any questions you have with your health care provider. Document Released: 04/21/2009 Document Revised: 12/01/2015 Document Reviewed: 07/30/2014 Elsevier Interactive Patient Education  2017 [employer].

## 2020-04-28 ENCOUNTER — Telehealth: Payer: Self-pay

## 2020-04-28 DIAGNOSIS — Z122 Encounter for screening for malignant neoplasm of respiratory organs: Secondary | ICD-10-CM

## 2020-04-28 DIAGNOSIS — Z87891 Personal history of nicotine dependence: Secondary | ICD-10-CM

## 2020-04-28 NOTE — Telephone Encounter (Signed)
Contacted patient for lung CT screening clinic based on referral from Dr. Army Melia.  Unable to leave message on home number as there is no answering machine.

## 2020-04-29 NOTE — Addendum Note (Signed)
Addended by: Lieutenant Diego on: 04/29/2020 09:54 AM   Modules accepted: Orders

## 2020-04-29 NOTE — Telephone Encounter (Signed)
Received referral for initial lung cancer screening scan. Contacted patient and obtained smoking history,(current, 46 pack year) as well as answering questions related to screening process. Patient denies signs of lung cancer such as weight loss or hemoptysis. Patient denies comorbidity that would prevent curative treatment if lung cancer were found. Patient is scheduled for shared decision making visit and CT scan on 06/01/20 at 1030am.

## 2020-05-03 DIAGNOSIS — F17200 Nicotine dependence, unspecified, uncomplicated: Secondary | ICD-10-CM | POA: Diagnosis not present

## 2020-05-03 DIAGNOSIS — R0789 Other chest pain: Secondary | ICD-10-CM | POA: Diagnosis not present

## 2020-05-03 DIAGNOSIS — E7849 Other hyperlipidemia: Secondary | ICD-10-CM | POA: Diagnosis not present

## 2020-05-03 DIAGNOSIS — I10 Essential (primary) hypertension: Secondary | ICD-10-CM | POA: Diagnosis not present

## 2020-05-03 DIAGNOSIS — Z23 Encounter for immunization: Secondary | ICD-10-CM | POA: Diagnosis not present

## 2020-05-06 ENCOUNTER — Ambulatory Visit: Payer: PPO | Admitting: Internal Medicine

## 2020-05-10 ENCOUNTER — Encounter: Payer: Self-pay | Admitting: Internal Medicine

## 2020-05-10 ENCOUNTER — Ambulatory Visit (INDEPENDENT_AMBULATORY_CARE_PROVIDER_SITE_OTHER): Payer: PPO | Admitting: Internal Medicine

## 2020-05-10 ENCOUNTER — Other Ambulatory Visit: Payer: Self-pay

## 2020-05-10 VITALS — BP 132/80 | HR 64 | Temp 97.7°F | Ht 72.0 in | Wt 203.0 lb

## 2020-05-10 DIAGNOSIS — M109 Gout, unspecified: Secondary | ICD-10-CM

## 2020-05-10 DIAGNOSIS — M5092 Unspecified cervical disc disorder, mid-cervical region, unspecified level: Secondary | ICD-10-CM | POA: Diagnosis not present

## 2020-05-10 DIAGNOSIS — R1013 Epigastric pain: Secondary | ICD-10-CM | POA: Diagnosis not present

## 2020-05-10 DIAGNOSIS — K319 Disease of stomach and duodenum, unspecified: Secondary | ICD-10-CM | POA: Diagnosis not present

## 2020-05-10 MED ORDER — INDOMETHACIN 25 MG PO CAPS: 25.0000 mg | ORAL_CAPSULE | Freq: Two times a day (BID) | ORAL | 1 refills | Status: DC | PRN

## 2020-05-10 NOTE — Progress Notes (Signed)
Date:  05/10/2020   Name:  Aaron [name]   DOB:  April 18, 1956   MRN:  [id number]   Chief Complaint: Numbness (Tingling and numbness in left arm- on and off. Patient has already been evaluated by the cardiolgist who said this is not cardiac and he needs to see PCP. ) and Stomach Growling (Patient says he has extra growling in his stomach and he has extra burping daily. )  Gastroesophageal Reflux He complains of abdominal pain and chest pain (had check up with cardiology and they cleared him). He reports no coughing or no wheezing. The current episode started 1 to 4 weeks ago. The problem occurs frequently. The problem has been gradually worsening. The symptoms are aggravated by certain foods and lying down. Pertinent negatives include no fatigue. He has tried nothing (stopped taking protonix about 2 weeks ago) for the symptoms.  Tingling in left arm - intermittent for several months; goes down into hand.  Often happens while sleeping - he wakes up and has to shake his arm to get the numbness out.  Also happens while sitting in his recliner watching TV.  He is left handed and L > R - just had mild sx in right hand yesterday.  He denies weakness or persistent numbness.  He had a severe neck injury as a teenager - no surgery but wore a brace for months.  Gout - needs refill on indomethacin.  Gout has been controlled but needs to keep meds on hand in case of a a flare.  CT cervical spine 09/2017: CT CERVICAL SPINE FINDINGS Alignment: No subluxation Skull base and vertebrae: No fracture Soft tissues and spinal canal: Prevertebral soft tissues are normal. No epidural or paraspinal hematoma. Disc levels: Degenerative disc disease with disc space narrowing and spurring at C4-5 and C6-7. Upper chest: Negative Other: Carotid artery calcifications.  Lab Results  Component Value Date   CREATININE 0.93 03/17/2020   BUN 19 03/17/2020   NA 132 (L) 11/09/2019   K 4.3 11/09/2019   CL 98 11/09/2019    CO2 23 11/09/2019   Lab Results  Component Value Date   CHOL 146 11/05/2019   HDL 38 (L) 11/05/2019   LDLCALC 82 11/05/2019   TRIG 149 11/05/2019   CHOLHDL 3.8 11/05/2019   Lab Results  Component Value Date   TSH 3.214 02/06/2019   Lab Results  Component Value Date   HGBA1C 5.8 (H) 11/05/2019   Lab Results  Component Value Date   WBC 6.6 11/09/2019   HGB 16.3 11/09/2019   HCT 45.3 11/09/2019   MCV 93.0 11/09/2019   PLT 208 11/09/2019   Lab Results  Component Value Date   ALT 40 11/09/2019   AST 31 11/09/2019   ALKPHOS 52 11/09/2019   BILITOT 0.8 11/09/2019     Review of Systems  Constitutional: Negative for chills, fatigue and fever.  Respiratory: Negative for cough, chest tightness, shortness of breath and wheezing.   Cardiovascular: Positive for chest pain (had check up with cardiology and they cleared him). Negative for palpitations and leg swelling.  Gastrointestinal: Positive for abdominal pain. Negative for blood in stool, constipation and diarrhea.  Musculoskeletal: Positive for neck stiffness.  Neurological: Positive for numbness. Negative for dizziness, tremors, weakness and headaches.    Patient Active Problem List   Diagnosis Date Noted  . Polyp of descending colon   . History of colonic polyps   . Alcohol dependence ([place]) 07/21/2019  . Chest pain 02/06/2019  .  Mixed hyperlipidemia 10/28/2018  . Tobacco use disorder 07/13/2016  . Elevated blood sugar 07/27/2015  . Controlled gout 12/22/2014  . Coronary artery disease involving native coronary artery of native heart with angina pectoris ([place]) 10/09/2014  . Essential (primary) hypertension 10/09/2014  . Dupuytren's disease of palm 10/09/2014  . Psoriasis 10/09/2014  . Pain in shoulder 10/09/2014  . H/O cardiac catheterization 01/27/2014  . Peripheral vascular disease ([place]) 01/27/2014    Allergies  Allergen Reactions  . Contrast Media [Iodinated Diagnostic Agents] Hives  . Iodine Hives     Past Surgical History:  Procedure Laterality Date  . APPENDECTOMY    . cardiac stents   2012  . COLONOSCOPY  03/30/2013   [place]  . COLONOSCOPY WITH PROPOFOL N/A 12/10/2019   Procedure: COLONOSCOPY WITH PROPOFOL;  Surgeon: [name], MD;  Location: [place];  Service: Endoscopy;  Laterality: N/A;  . COLONOSCOPY WITH PROPOFOL N/A 01/18/2020   Procedure: COLONOSCOPY WITH PROPOFOL;  Surgeon: [name], MD;  Location: [place];  Service: Endoscopy;  Laterality: N/A;  4  . LOWER EXTREMITY ANGIOGRAPHY Right 09/25/2018   Procedure: LOWER EXTREMITY ANGIOGRAPHY;  Surgeon: Algernon Huxley, MD;  Location: [facility] [facility];  Service: Cardiovascular;  Laterality: Right;  . LOWER EXTREMITY ANGIOGRAPHY Left 03/17/2020   Procedure: LOWER EXTREMITY ANGIOGRAPHY;  Surgeon: Algernon Huxley, MD;  Location: [place] [facility];  Service: Cardiovascular;  Laterality: Left;  . POLYPECTOMY N/A 01/18/2020   Procedure: POLYPECTOMY;  Surgeon: [name], MD;  Location: [place];  Service: Endoscopy;  Laterality: N/A;    Social History   Tobacco Use  . Smoking status: Current Some Day Smoker    Packs/day: 0.50    Types: Cigarettes    Start date: 09/03/1976  . Smokeless tobacco: Never Used  . Tobacco comment: chantix   Vaping Use  . Vaping Use: Never used  Substance Use Topics  . Alcohol use: Yes    Comment: quit drinking around 04/2019(May have drink 1x/mo)  . Drug use: No     Medication list has been reviewed and updated.  Current Meds  Medication Sig  . albuterol (VENTOLIN HFA) 108 (90 Base) MCG/ACT inhaler Inhale 2 puffs into the lungs every 6 (six) hours as needed for wheezing or shortness of breath.  [name] aspirin EC 81 MG tablet Take 1 tablet (81 mg total) by mouth daily.  . CHANTIX 0.5 MG tablet Take 0.5 mg by mouth 2 (two) times daily.  . cilostazol (PLETAL) 50 MG tablet Take by mouth 2 (two) times daily.   . clopidogrel (PLAVIX) 75 MG tablet Take 1 tablet  by mouth daily.  . indomethacin (INDOCIN) 25 MG capsule Take 1 capsule (25 mg total) by mouth 2 (two) times daily as needed.  . isosorbide mononitrate (IMDUR) 120 MG 24 hr tablet Take by mouth.  [name] lisinopril (PRINIVIL,ZESTRIL) 10 MG tablet Take 1 tablet by mouth daily.  . metoprolol tartrate (LOPRESSOR) 50 MG tablet Take 50-75 mg by mouth 2 (two) times a day. Take 50MG  by mouth every morning and 75MG  every evening  . nitroGLYCERIN (NITROSTAT) 0.4 MG SL tablet Place 1 tablet under the tongue as needed.  . simvastatin (ZOCOR) 40 MG tablet Take 1 tablet (40 mg total) by mouth at bedtime.    PHQ 2/9 Scores 05/10/2020 04/27/2020 11/05/2019 07/21/2019  PHQ - 2 Score 0 0 0 0  PHQ- 9 Score 0 - 0 0    GAD 7 : Generalized Anxiety Score 05/10/2020  Nervous, Anxious, on Edge 0  Control/stop worrying 0  Worry too much - different things 0  Trouble relaxing 0  Restless 0  Easily annoyed or irritable 0  Afraid - awful might happen 0  Total GAD 7 Score 0  Anxiety Difficulty Not difficult at all    BP Readings from Last 3 Encounters:  05/10/20 132/80  03/24/20 117/68  03/17/20 (!) 129/100    Physical Exam Vitals and nursing note reviewed.  Constitutional:      General: He is not in acute distress.    Appearance: Normal appearance. He is well-developed.  HENT:     Head: Normocephalic and atraumatic.  Neck:     Vascular: No carotid bruit.  Cardiovascular:     Rate and Rhythm: Normal rate and regular rhythm.     Pulses: Normal pulses.  Pulmonary:     Effort: Pulmonary effort is normal. No respiratory distress.     Breath sounds: No wheezing or rhonchi.  Abdominal:     Palpations: Abdomen is soft.     Tenderness: There is no abdominal tenderness.  Musculoskeletal:        General: Normal range of motion.     Right shoulder: Normal.     Left shoulder: Normal.     Right elbow: Normal.     Left elbow: Normal.     Right wrist: Normal. No tenderness. Normal range of motion.     Left wrist:  Normal. No tenderness. Normal range of motion.     Cervical back: Normal range of motion. No tenderness. No pain with movement, spinous process tenderness or muscular tenderness.     Comments: Negative Tinel's and Phalen's bilaterally  Lymphadenopathy:     Cervical: No cervical adenopathy.  Skin:    General: Skin is warm and dry.     Findings: No rash.  Neurological:     Mental Status: He is alert and oriented to person, place, and time.     Cranial Nerves: Cranial nerves are intact.     Sensory: Sensation is intact.     Motor: Motor function is intact.     Deep Tendon Reflexes:     Reflex Scores:      Bicep reflexes are 2+ on the right side and 2+ on the left side.      Patellar reflexes are 2+ on the right side and 2+ on the left side. Psychiatric:        Behavior: Behavior normal.        Thought Content: Thought content normal.     Wt Readings from Last 3 Encounters:  05/10/20 203 lb (92.1 kg)  03/24/20 199 lb (90.3 kg)  03/17/20 200 lb (90.7 kg)    BP 132/80   Pulse 64   Temp 97.7 F (36.5 C) (Oral)   Ht 6' (1.829 m)   Wt 203 lb (92.1 kg)   BMI 27.53 kg/m   Assessment and Plan: 1. Dyspepsia and disorder of function of stomach Resume Protonix daily - if sx are controlled after a week, can reduce dosing the tiw If no improvement, return for evaluation  2. Controlled gout - indomethacin (INDOCIN) 25 MG capsule; Take 1 capsule (25 mg total) by mouth 2 (two) times daily as needed.  Dispense: 30 capsule; Refill: 1  3. Cervical disc disorder of mid-cervical region Suspect he has intermittent nerve impingement to his LUE stemming from prior injury and cervical disc disease noted on CT in 2019 Recommend postioning changes to avoid aggravation If  worsening/more persistent return for evaluation.   Partially dictated using Editor, commissioning. Any errors are unintentional.  Halina Maidens, MD [facility]  05/10/2020

## 2020-05-16 ENCOUNTER — Other Ambulatory Visit: Payer: Self-pay

## 2020-05-16 ENCOUNTER — Encounter: Payer: Self-pay | Admitting: Internal Medicine

## 2020-05-16 ENCOUNTER — Ambulatory Visit (INDEPENDENT_AMBULATORY_CARE_PROVIDER_SITE_OTHER): Payer: PPO | Admitting: Internal Medicine

## 2020-05-16 VITALS — BP 112/72 | HR 66 | Temp 97.0°F | Ht 72.0 in | Wt 199.0 lb

## 2020-05-16 DIAGNOSIS — R7303 Prediabetes: Secondary | ICD-10-CM | POA: Diagnosis not present

## 2020-05-16 DIAGNOSIS — J449 Chronic obstructive pulmonary disease, unspecified: Secondary | ICD-10-CM | POA: Diagnosis not present

## 2020-05-16 DIAGNOSIS — R1013 Epigastric pain: Secondary | ICD-10-CM

## 2020-05-16 DIAGNOSIS — I10 Essential (primary) hypertension: Secondary | ICD-10-CM

## 2020-05-16 DIAGNOSIS — K319 Disease of stomach and duodenum, unspecified: Secondary | ICD-10-CM | POA: Diagnosis not present

## 2020-05-16 NOTE — Progress Notes (Signed)
Date:  05/16/2020   Name:  Aaron [name]   DOB:  Feb 10, 1956   MRN:  [id number]   Chief Complaint: Hypertension (Follow up) and Diabetes (Follow up)  Hypertension This is a chronic problem. The problem is controlled. Pertinent negatives include no chest pain, headaches, palpitations or shortness of breath. Risk factors for coronary artery disease include diabetes mellitus and dyslipidemia. Past treatments include beta blockers and ACE inhibitors. The current treatment provides significant improvement. There are no compliance problems.  Hypertensive end-organ damage includes CAD/MI. There is no history of kidney disease or CVA.  Diabetes He presents for his follow-up diabetic visit. Diabetes type: prediabetes. His disease course has been stable. Pertinent negatives for hypoglycemia include no headaches or tremors. There are no diabetic associated symptoms. Pertinent negatives for diabetes include no chest pain, no fatigue, no polydipsia, no polyuria and no weakness. There are no hypoglycemic complications. Pertinent negatives for diabetic complications include no CVA. Current diabetic treatment includes diet. He is compliant with treatment most of the time. His weight is stable. An ACE inhibitor/angiotensin II receptor blocker is being taken.  Gastroesophageal Reflux He complains of heartburn. He reports no abdominal pain, no chest pain, no coughing (occasional) or no wheezing. The problem occurs rarely. The problem has been rapidly improving. The heartburn is located in the substernum. The heartburn is of mild intensity. The heartburn does not wake him from sleep. The heartburn does not limit his activity. The heartburn doesn't change with position. Pertinent negatives include no fatigue. He has tried a PPI for the symptoms. The treatment provided significant relief.     Lab Results  Component Value Date   CREATININE 0.93 03/17/2020   BUN 19 03/17/2020   NA 132 (L) 11/09/2019   K 4.3  11/09/2019   CL 98 11/09/2019   CO2 23 11/09/2019   Lab Results  Component Value Date   CHOL 146 11/05/2019   HDL 38 (L) 11/05/2019   LDLCALC 82 11/05/2019   TRIG 149 11/05/2019   CHOLHDL 3.8 11/05/2019   Lab Results  Component Value Date   TSH 3.214 02/06/2019   Lab Results  Component Value Date   HGBA1C 5.8 (H) 11/05/2019   Lab Results  Component Value Date   WBC 6.6 11/09/2019   HGB 16.3 11/09/2019   HCT 45.3 11/09/2019   MCV 93.0 11/09/2019   PLT 208 11/09/2019   Lab Results  Component Value Date   ALT 40 11/09/2019   AST 31 11/09/2019   ALKPHOS 52 11/09/2019   BILITOT 0.8 11/09/2019     Review of Systems  Constitutional: Negative for appetite change, fatigue and unexpected weight change.  Eyes: Negative for visual disturbance.  Respiratory: Negative for cough (occasional), shortness of breath and wheezing.   Cardiovascular: Negative for chest pain, palpitations and leg swelling.  Gastrointestinal: Positive for heartburn. Negative for abdominal pain, constipation and diarrhea.  Endocrine: Negative for polydipsia and polyuria.  Genitourinary: Negative for dysuria and hematuria.  Skin: Negative for color change and rash.  Neurological: Negative for tremors, weakness, numbness and headaches.  Psychiatric/Behavioral: Negative for dysphoric mood.    Patient Active Problem List   Diagnosis Date Noted  . Cervical disc disorder of mid-cervical region 05/10/2020  . Polyp of descending colon   . History of colonic polyps   . Alcohol dependence ([place]) 07/21/2019  . Mixed hyperlipidemia 10/28/2018  . Tobacco use disorder 07/13/2016  . Elevated blood sugar 07/27/2015  . Controlled gout 12/22/2014  . Coronary artery  disease involving native coronary artery of native heart with angina pectoris ([place]) 10/09/2014  . Essential (primary) hypertension 10/09/2014  . Dupuytren's disease of palm 10/09/2014  . Psoriasis 10/09/2014  . Pain in shoulder 10/09/2014  . H/O  cardiac catheterization 01/27/2014  . Peripheral vascular disease ([place]) 01/27/2014    Allergies  Allergen Reactions  . Contrast Media [Iodinated Diagnostic Agents] Hives  . Iodine Hives    Past Surgical History:  Procedure Laterality Date  . APPENDECTOMY    . cardiac stents   2012  . COLONOSCOPY  03/30/2013   [place]  . COLONOSCOPY WITH PROPOFOL N/A 12/10/2019   Procedure: COLONOSCOPY WITH PROPOFOL;  Surgeon: [name], MD;  Location: [place];  Service: Endoscopy;  Laterality: N/A;  . COLONOSCOPY WITH PROPOFOL N/A 01/18/2020   Procedure: COLONOSCOPY WITH PROPOFOL;  Surgeon: [name], MD;  Location: [place];  Service: Endoscopy;  Laterality: N/A;  4  . LOWER EXTREMITY ANGIOGRAPHY Right 09/25/2018   Procedure: LOWER EXTREMITY ANGIOGRAPHY;  Surgeon: Algernon Huxley, MD;  Location: [place] [facility];  Service: Cardiovascular;  Laterality: Right;  . LOWER EXTREMITY ANGIOGRAPHY Left 03/17/2020   Procedure: LOWER EXTREMITY ANGIOGRAPHY;  Surgeon: Algernon Huxley, MD;  Location: [place] [facility];  Service: Cardiovascular;  Laterality: Left;  . POLYPECTOMY N/A 01/18/2020   Procedure: POLYPECTOMY;  Surgeon: [name], MD;  Location: [place];  Service: Endoscopy;  Laterality: N/A;    Social History   Tobacco Use  . Smoking status: Current Some Day Smoker    Packs/day: 0.50    Types: Cigarettes    Start date: 09/03/1976  . Smokeless tobacco: Never Used  . Tobacco comment: chantix   Vaping Use  . Vaping Use: Never used  Substance Use Topics  . Alcohol use: Yes    Comment: quit drinking around 04/2019(May have drink 1x/mo)  . Drug use: No     Medication list has been reviewed and updated.  Current Meds  Medication Sig  . albuterol (VENTOLIN HFA) 108 (90 Base) MCG/ACT inhaler Inhale 2 puffs into the lungs every 6 (six) hours as needed for wheezing or shortness of breath.  [name] aspirin EC 81 MG tablet Take 1 tablet (81 mg total) by mouth daily.    . CHANTIX 0.5 MG tablet Take 0.5 mg by mouth 2 (two) times daily.  . cilostazol (PLETAL) 50 MG tablet Take by mouth 2 (two) times daily.   . clopidogrel (PLAVIX) 75 MG tablet Take 1 tablet by mouth daily.  . indomethacin (INDOCIN) 25 MG capsule Take 1 capsule (25 mg total) by mouth 2 (two) times daily as needed.  . isosorbide mononitrate (IMDUR) 120 MG 24 hr tablet Take by mouth.  [name] lisinopril (PRINIVIL,ZESTRIL) 10 MG tablet Take 1 tablet by mouth daily.  . metoprolol tartrate (LOPRESSOR) 50 MG tablet Take 50-75 mg by mouth 2 (two) times a day. Take 50MG  by mouth every morning and 75MG  every evening  . nitroGLYCERIN (NITROSTAT) 0.4 MG SL tablet Place 1 tablet under the tongue as needed.  . pantoprazole (PROTONIX) 40 MG tablet Take 1 tablet (40 mg total) by mouth daily.  . simvastatin (ZOCOR) 40 MG tablet Take 1 tablet (40 mg total) by mouth at bedtime.    PHQ 2/9 Scores 05/16/2020 05/10/2020 04/27/2020 11/05/2019  PHQ - 2 Score 0 0 0 0  PHQ- 9 Score 0 0 - 0    GAD 7 : Generalized Anxiety Score 05/16/2020 05/10/2020  Nervous, Anxious, on Edge 0 0  Control/stop worrying 0 0  Worry too much - different things 0 0  Trouble relaxing 0 0  Restless 0 0  Easily annoyed or irritable 0 0  Afraid - awful might happen 0 0  Total GAD 7 Score 0 0  Anxiety Difficulty Not difficult at all Not difficult at all    BP Readings from Last 3 Encounters:  05/16/20 112/72  05/10/20 132/80  03/24/20 117/68    Physical Exam Constitutional:      Appearance: Normal appearance.  Cardiovascular:     Rate and Rhythm: Normal rate and regular rhythm.     Pulses: Normal pulses.     Heart sounds: No murmur heard.   Pulmonary:     Effort: Pulmonary effort is normal.     Breath sounds: Normal breath sounds.  Musculoskeletal:     Cervical back: Normal range of motion.     Right lower leg: No edema.     Left lower leg: No edema.  Lymphadenopathy:     Cervical: No cervical adenopathy.  Neurological:      General: No focal deficit present.     Mental Status: He is alert.  Psychiatric:        Mood and Affect: Mood normal.     Wt Readings from Last 3 Encounters:  05/16/20 199 lb (90.3 kg)  05/10/20 203 lb (92.1 kg)  03/24/20 199 lb (90.3 kg)    BP 112/72   Pulse 66   Temp (!) 97 F (36.1 C) (Oral)   Ht 6' (1.829 m)   Wt 199 lb (90.3 kg)   SpO2 97%   BMI 26.99 kg/m   Assessment and Plan: 1. Essential (primary) hypertension Clinically stable exam with well controlled BP. Tolerating medications without side effects at this time. Pt to continue current regimen and low sodium diet; benefits of regular exercise as able discussed.  2. Prediabetes Continue to work on healthy diet and weight control - Hemoglobin A1c  3. Dyspepsia and disorder of function of stomach Much improved on pantoprazole daily Continue current therapy  4. Stage 2 moderate COPD by GOLD classification ([place]) Followed by Pulmonary Only using Albuterol MDI PRN   Partially dictated using Editor, commissioning. Any errors are unintentional.  Halina Maidens, MD [facility]  05/16/2020

## 2020-05-17 LAB — HEMOGLOBIN A1C
Est. average glucose Bld gHb Est-mCnc: 123 mg/dL
Hgb A1c MFr Bld: 5.9 % — ABNORMAL HIGH (ref 4.8–5.6)

## 2020-06-01 ENCOUNTER — Other Ambulatory Visit: Payer: Self-pay

## 2020-06-01 ENCOUNTER — Ambulatory Visit: Payer: PPO

## 2020-06-01 ENCOUNTER — Inpatient Hospital Stay: Payer: PPO | Admitting: Oncology

## 2020-06-01 NOTE — Progress Notes (Signed)
error 

## 2020-06-08 ENCOUNTER — Other Ambulatory Visit: Payer: Self-pay | Admitting: Internal Medicine

## 2020-06-08 DIAGNOSIS — M109 Gout, unspecified: Secondary | ICD-10-CM

## 2020-06-08 NOTE — Telephone Encounter (Signed)
Requested medication (s) are due for refill today - if patient is using twice daily- he would be out with #30 1 RF  Requested medication (s) are on the active medication list - yes  Future visit scheduled -yes  Last refill: 05/10/20  Notes to clinic: Request sent for review- #30 for medication potentially used twice daily  Requested Prescriptions  Pending Prescriptions Disp Refills   indomethacin (INDOCIN) 25 MG capsule [Pharmacy Med Name: INDOMETHACIN 25 MG CAP] 30 capsule 1    Sig: TAKE (1) CAPSULE BY MOUTH TWICE DAILY ASNEEDED.      Analgesics:  NSAIDS Passed - 06/08/2020  1:46 PM      Passed - Cr in normal range and within 360 days    Creatinine  Date Value Ref Range Status  07/02/2014 0.81 0.60 - 1.30 mg/dL Final   Creatinine, Ser  Date Value Ref Range Status  03/17/2020 0.93 0.61 - 1.24 mg/dL Final          Passed - HGB in normal range and within 360 days    Hemoglobin  Date Value Ref Range Status  11/09/2019 16.3 13.0 - 17.0 g/dL Final  11/05/2019 16.6 13.0 - 17.7 g/dL Final          Passed - Patient is not pregnant      Passed - Valid encounter within last 12 months    Recent Outpatient Visits           3 weeks ago Essential (primary) hypertension   [facility] Glean Hess, MD   4 weeks ago Dyspepsia and disorder of function of stomach   [facility] Glean Hess, MD   7 months ago Annual physical exam   [facility] Glean Hess, MD   8 months ago Tinea corporis   [facility] Glean Hess, MD   10 months ago Gastroesophageal reflux disease, unspecified whether esophagitis present   [facility] Glean Hess, MD       Future Appointments             In 5 months Army Melia Jesse Sans, MD [facility], [facility]                Requested Prescriptions  Pending Prescriptions Disp Refills   indomethacin (INDOCIN) 25 MG capsule [Pharmacy Med Name: INDOMETHACIN 25 MG CAP] 30  capsule 1    Sig: TAKE (1) CAPSULE BY MOUTH TWICE DAILY ASNEEDED.      Analgesics:  NSAIDS Passed - 06/08/2020  1:46 PM      Passed - Cr in normal range and within 360 days    Creatinine  Date Value Ref Range Status  07/02/2014 0.81 0.60 - 1.30 mg/dL Final   Creatinine, Ser  Date Value Ref Range Status  03/17/2020 0.93 0.61 - 1.24 mg/dL Final          Passed - HGB in normal range and within 360 days    Hemoglobin  Date Value Ref Range Status  11/09/2019 16.3 13.0 - 17.0 g/dL Final  11/05/2019 16.6 13.0 - 17.7 g/dL Final          Passed - Patient is not pregnant      Passed - Valid encounter within last 12 months    Recent Outpatient Visits           3 weeks ago Essential (primary) hypertension   [facility] Glean Hess, MD   4 weeks ago Dyspepsia and disorder  of function of stomach   [facility] Glean Hess, MD   7 months ago Annual physical exam   [facility] Glean Hess, MD   8 months ago Tinea corporis   [facility] Glean Hess, MD   10 months ago Gastroesophageal reflux disease, unspecified whether esophagitis present   [facility] Glean Hess, MD       Future Appointments             In 5 months Army Melia Jesse Sans, MD [facility], [facility]

## 2020-06-20 ENCOUNTER — Other Ambulatory Visit (INDEPENDENT_AMBULATORY_CARE_PROVIDER_SITE_OTHER): Payer: Self-pay | Admitting: Nurse Practitioner

## 2020-06-20 DIAGNOSIS — I739 Peripheral vascular disease, unspecified: Secondary | ICD-10-CM

## 2020-06-21 ENCOUNTER — Encounter (INDEPENDENT_AMBULATORY_CARE_PROVIDER_SITE_OTHER): Payer: PPO

## 2020-06-21 ENCOUNTER — Ambulatory Visit (INDEPENDENT_AMBULATORY_CARE_PROVIDER_SITE_OTHER): Payer: PPO | Admitting: Vascular Surgery

## 2020-06-29 ENCOUNTER — Inpatient Hospital Stay: Payer: PPO | Attending: Oncology | Admitting: Oncology

## 2020-06-29 ENCOUNTER — Other Ambulatory Visit: Payer: Self-pay

## 2020-06-29 ENCOUNTER — Ambulatory Visit
Admission: RE | Admit: 2020-06-29 | Discharge: 2020-06-29 | Disposition: A | Discharge status: Home / Self Care | Payer: PPO | Source: Ambulatory Visit | Attending: Oncology | Admitting: Oncology

## 2020-06-29 ENCOUNTER — Encounter: Payer: Self-pay | Admitting: Oncology

## 2020-06-29 DIAGNOSIS — Z122 Encounter for screening for malignant neoplasm of respiratory organs: Secondary | ICD-10-CM | POA: Diagnosis not present

## 2020-06-29 DIAGNOSIS — Z87891 Personal history of nicotine dependence: Secondary | ICD-10-CM | POA: Insufficient documentation

## 2020-06-29 DIAGNOSIS — F17210 Nicotine dependence, cigarettes, uncomplicated: Secondary | ICD-10-CM

## 2020-06-29 NOTE — Progress Notes (Signed)
Virtual Visit via Video Note  I connected with Aaron [name] on 06/29/20 at 10:30 AM EST by a video enabled telemedicine application and verified that I am speaking with the correct person using two identifiers.  Location: Patient: [facility] Provider: Clinic    I discussed the limitations of evaluation and management by telemedicine and the availability of in person appointments. The patient expressed understanding and agreed to proceed.  I discussed the assessment and treatment plan with the patient. The patient was provided an opportunity to ask questions and all were answered. The patient agreed with the plan and demonstrated an understanding of the instructions.   The patient was advised to call back or seek an in-person evaluation if the symptoms worsen or if the condition fails to improve as anticipated.   In accordance with CMS guidelines, patient has met eligibility criteria including age, absence of signs or symptoms of lung cancer.  Social History   Tobacco Use  . Smoking status: Current Every Day Smoker    Packs/day: 1.00    Years: 46.00    Pack years: 46.00    Types: Cigarettes  . Smokeless tobacco: Never Used  . Tobacco comment: chantix   Vaping Use  . Vaping Use: Never used  Substance Use Topics  . Alcohol use: Yes    Comment: quit drinking around 04/2019(May have drink 1x/mo)  . Drug use: No      A shared decision-making session was conducted prior to the performance of CT scan. This includes one or more decision aids, includes benefits and harms of screening, follow-up diagnostic testing, over-diagnosis, false positive rate, and total radiation exposure.   Counseling on the importance of adherence to annual lung cancer LDCT screening, impact of co-morbidities, and ability or willingness to undergo diagnosis and treatment is imperative for compliance of the program.   Counseling on the importance of continued smoking cessation for former smokers; the importance of smoking  cessation for current smokers, and information about tobacco cessation interventions have been given to patient including [place] and [place] quit [place] programs.   Written order for lung cancer screening with LDCT has been given to the patient and any and all questions have been answered to the best of my abilities.    Yearly follow up will be coordinated by Burgess Estelle, Thoracic Navigator.  I provided 15 minutes of face-to-face video visit time during this encounter, and > 50% was spent counseling as documented under my assessment & plan.   Jacquelin Hawking, NP

## 2020-06-30 ENCOUNTER — Telehealth: Payer: Self-pay | Admitting: *Deleted

## 2020-06-30 NOTE — Telephone Encounter (Signed)
Notified patient of LDCT lung cancer screening program results with recommendation for 12 month follow up imaging. Also notified of incidental findings noted below and is encouraged to discuss further with PCP who will receive a copy of this note and/or the CT report. Patient verbalizes understanding.    IMPRESSION: 1. Lung-RADS 2, benign appearance or behavior. Continue annual screening with low-dose chest CT without contrast in 12 months. 2. Emphysema (ICD10-J43.9) and Aortic Atherosclerosis (ICD10-170.0)   

## 2020-07-06 DIAGNOSIS — I10 Essential (primary) hypertension: Secondary | ICD-10-CM | POA: Diagnosis not present

## 2020-07-06 DIAGNOSIS — I739 Peripheral vascular disease, unspecified: Secondary | ICD-10-CM | POA: Diagnosis not present

## 2020-07-06 DIAGNOSIS — I2510 Atherosclerotic heart disease of native coronary artery without angina pectoris: Secondary | ICD-10-CM | POA: Diagnosis not present

## 2020-07-06 DIAGNOSIS — Z9889 Other specified postprocedural states: Secondary | ICD-10-CM | POA: Diagnosis not present

## 2020-07-06 DIAGNOSIS — E785 Hyperlipidemia, unspecified: Secondary | ICD-10-CM | POA: Diagnosis not present

## 2020-07-06 DIAGNOSIS — I200 Unstable angina: Secondary | ICD-10-CM | POA: Diagnosis not present

## 2020-07-06 DIAGNOSIS — I25118 Atherosclerotic heart disease of native coronary artery with other forms of angina pectoris: Secondary | ICD-10-CM | POA: Diagnosis not present

## 2020-07-06 DIAGNOSIS — Z23 Encounter for immunization: Secondary | ICD-10-CM | POA: Diagnosis not present

## 2020-07-06 DIAGNOSIS — R0789 Other chest pain: Secondary | ICD-10-CM | POA: Diagnosis not present

## 2020-07-06 DIAGNOSIS — F17200 Nicotine dependence, unspecified, uncomplicated: Secondary | ICD-10-CM | POA: Diagnosis not present

## 2020-07-10 ENCOUNTER — Encounter: Payer: Self-pay | Admitting: Internal Medicine

## 2020-07-10 DIAGNOSIS — I700 Atherosclerosis of aorta: Secondary | ICD-10-CM | POA: Insufficient documentation

## 2020-07-12 ENCOUNTER — Other Ambulatory Visit: Payer: Self-pay

## 2020-07-12 ENCOUNTER — Encounter: Payer: Self-pay | Admitting: Internal Medicine

## 2020-07-12 ENCOUNTER — Ambulatory Visit (INDEPENDENT_AMBULATORY_CARE_PROVIDER_SITE_OTHER): Payer: PPO | Admitting: Internal Medicine

## 2020-07-12 VITALS — BP 140/78 | HR 71 | Temp 98.2°F | Ht 72.0 in | Wt 201.0 lb

## 2020-07-12 DIAGNOSIS — J449 Chronic obstructive pulmonary disease, unspecified: Secondary | ICD-10-CM | POA: Diagnosis not present

## 2020-07-12 DIAGNOSIS — I25119 Atherosclerotic heart disease of native coronary artery with unspecified angina pectoris: Secondary | ICD-10-CM

## 2020-07-12 DIAGNOSIS — I739 Peripheral vascular disease, unspecified: Secondary | ICD-10-CM

## 2020-07-12 DIAGNOSIS — M5412 Radiculopathy, cervical region: Secondary | ICD-10-CM

## 2020-07-12 MED ORDER — METHOCARBAMOL 500 MG PO TABS: 500.0000 mg | ORAL_TABLET | Freq: Every day | ORAL | 1 refills | Status: DC

## 2020-07-12 NOTE — Progress Notes (Signed)
Date:  07/12/2020   Name:  Aaron [name]   DOB:  [birth year]   MRN:  [id number]   Chief Complaint: referral neurology  (Lung scan results, numbess left arm from shoulder to hands, comes and goes, X2-3 weeks)  HPI Numbness - intermittent in left arm from the shoulder to the hand.  Occurs about once a day and lasts only a few minutes.  It resolves with moving the arm and changing position.  His arm is not weak.  The episodes are not related to any particular activity but happens frequently when sitting watching.  He had a neck injury at age 65 - fractured several vertebrae requiring casting and traction.  Lab Results  Component Value Date   CREATININE 0.93 03/17/2020   BUN 19 03/17/2020   NA 132 (L) 11/09/2019   K 4.3 11/09/2019   CL 98 11/09/2019   CO2 23 11/09/2019   Lab Results  Component Value Date   CHOL 146 11/05/2019   HDL 38 (L) 11/05/2019   LDLCALC 82 11/05/2019   TRIG 149 11/05/2019   CHOLHDL 3.8 11/05/2019   Lab Results  Component Value Date   TSH 3.214 02/06/2019   Lab Results  Component Value Date   HGBA1C 5.9 (H) 05/16/2020   Lab Results  Component Value Date   WBC 6.6 11/09/2019   HGB 16.3 11/09/2019   HCT 45.3 11/09/2019   MCV 93.0 11/09/2019   PLT 208 11/09/2019   Lab Results  Component Value Date   ALT 40 11/09/2019   AST 31 11/09/2019   ALKPHOS 52 11/09/2019   BILITOT 0.8 11/09/2019     Review of Systems  Constitutional: Negative for chills and fatigue.  Respiratory: Negative for choking, shortness of breath and wheezing.   Cardiovascular: Negative for chest pain and palpitations.  Musculoskeletal: Positive for arthralgias.  Neurological: Positive for numbness (intermittent in left arm). Negative for dizziness, weakness, light-headedness and headaches.  Psychiatric/Behavioral: Negative for sleep disturbance.    Patient Active Problem List   Diagnosis Date Noted  . Aortic atherosclerosis ([place]) 07/10/2020  . Stage 2 moderate COPD by  GOLD classification ([place]) 05/16/2020  . Dyspepsia and disorder of function of stomach 05/16/2020  . Cervical disc disorder of mid-cervical region 05/10/2020  . Polyp of descending colon   . History of colonic polyps   . Alcohol dependence ([place]) 07/21/2019  . Mixed hyperlipidemia 10/28/2018  . Tobacco use disorder 07/13/2016  . Prediabetes 07/27/2015  . Controlled gout 12/22/2014  . Coronary artery disease involving native coronary artery of native heart with angina pectoris ([place]) 10/09/2014  . Essential (primary) hypertension 10/09/2014  . Dupuytren's disease of palm 10/09/2014  . Psoriasis 10/09/2014  . Pain in shoulder 10/09/2014  . H/O cardiac catheterization 01/27/2014  . Peripheral vascular disease ([place]) 01/27/2014    Allergies  Allergen Reactions  . Contrast Media [Iodinated Diagnostic Agents] Hives  . Iodine Hives    Past Surgical History:  Procedure Laterality Date  . APPENDECTOMY    . cardiac stents   2012  . COLONOSCOPY  03/30/2013   [place]  . COLONOSCOPY WITH PROPOFOL N/A 12/10/2019   Procedure: COLONOSCOPY WITH PROPOFOL;  Surgeon: [name], MD;  Location: [place];  Service: Endoscopy;  Laterality: N/A;  . COLONOSCOPY WITH PROPOFOL N/A 01/18/2020   Procedure: COLONOSCOPY WITH PROPOFOL;  Surgeon: [name], MD;  Location: [place];  Service: Endoscopy;  Laterality: N/A;  4  . LOWER EXTREMITY ANGIOGRAPHY Right 09/25/2018   Procedure:  LOWER EXTREMITY ANGIOGRAPHY;  Surgeon: Annice Needy, MD;  Location: [facility];  Service: Cardiovascular;  Laterality: Right;  . LOWER EXTREMITY ANGIOGRAPHY Left 03/17/2020   Procedure: LOWER EXTREMITY ANGIOGRAPHY;  Surgeon: Annice Needy, MD;  Location: [facility];  Service: Cardiovascular;  Laterality: Left;  . POLYPECTOMY N/A 01/18/2020   Procedure: POLYPECTOMY;  Surgeon: Midge Minium, MD;  Location: [facility];  Service: Endoscopy;  Laterality: N/A;    Social History   Tobacco  Use  . Smoking status: Current Every Day Smoker    Packs/day: 1.00    Years: 46.00    Pack years: 46.00    Types: Cigarettes  . Smokeless tobacco: Never Used  . Tobacco comment: chantix   Vaping Use  . Vaping Use: Never used  Substance Use Topics  . Alcohol use: Yes    Comment: quit drinking around 04/2019(May have drink 1x/mo)  . Drug use: No     Medication list has been reviewed and updated.  Current Meds  Medication Sig  . albuterol (VENTOLIN HFA) 108 (90 Base) MCG/ACT inhaler Inhale 2 puffs into the lungs every 6 (six) hours as needed for wheezing or shortness of breath.  [name] aspirin EC 81 MG tablet Take 1 tablet (81 mg total) by mouth daily.  . CHANTIX 0.5 MG tablet Take 0.5 mg by mouth 2 (two) times daily.  . cilostazol (PLETAL) 50 MG tablet Take by mouth 2 (two) times daily.   . clopidogrel (PLAVIX) 75 MG tablet Take 1 tablet by mouth daily.  . indomethacin (INDOCIN) 25 MG capsule Take 1 capsule (25 mg total) by mouth 2 (two) times daily as needed.  . isosorbide mononitrate (IMDUR) 120 MG 24 hr tablet Take by mouth.  [name] lisinopril (PRINIVIL,ZESTRIL) 10 MG tablet Take 1 tablet by mouth daily.  . metoprolol tartrate (LOPRESSOR) 50 MG tablet Take 50-75 mg by mouth 2 (two) times a day. Take 50MG  by mouth every morning and 75MG  every evening  . nitroGLYCERIN (NITROSTAT) 0.4 MG SL tablet Place 1 tablet under the tongue as needed.  . pantoprazole (PROTONIX) 40 MG tablet Take 1 tablet (40 mg total) by mouth daily.  . simvastatin (ZOCOR) 40 MG tablet Take 1 tablet (40 mg total) by mouth at bedtime.    PHQ 2/9 Scores 07/12/2020 05/16/2020 05/10/2020 04/27/2020  PHQ - 2 Score 0 0 0 0  PHQ- 9 Score 0 0 0 -    GAD 7 : Generalized Anxiety Score 07/12/2020 05/16/2020 05/10/2020  Nervous, Anxious, on Edge 0 0 0  Control/stop worrying 0 0 0  Worry too much - different things 0 0 0  Trouble relaxing 0 0 0  Restless 0 0 0  Easily annoyed or irritable 0 0 0  Afraid - awful might happen 0 0 0   Total GAD 7 Score 0 0 0  Anxiety Difficulty - Not difficult at all Not difficult at all    BP Readings from Last 3 Encounters:  07/12/20 (!) 142/78  05/16/20 112/72  05/10/20 132/80    Physical Exam Vitals and nursing note reviewed.  Constitutional:      General: He is not in acute distress.    Appearance: Normal appearance. He is well-developed.  HENT:     Head: Normocephalic and atraumatic.  Neck:     Vascular: No carotid bruit.  Cardiovascular:     Rate and Rhythm: Normal rate and regular rhythm.  Pulmonary:     Effort: Pulmonary effort is normal. No  respiratory distress.     Breath sounds: No wheezing or rhonchi.  Musculoskeletal:        General: Normal range of motion.     Cervical back: Normal range of motion.  Lymphadenopathy:     Cervical: No cervical adenopathy.  Skin:    General: Skin is warm and dry.     Findings: No rash.  Neurological:     Mental Status: He is alert and oriented to person, place, and time.     Sensory: Sensation is intact.     Motor: Motor function is intact. No weakness, tremor or atrophy.     Coordination: Coordination is intact.     Deep Tendon Reflexes:     Reflex Scores:      Bicep reflexes are 2+ on the right side and 2+ on the left side. Psychiatric:        Mood and Affect: Mood and affect normal.        Behavior: Behavior normal.        Thought Content: Thought content normal.     Wt Readings from Last 3 Encounters:  07/12/20 201 lb (91.2 kg)  06/29/20 199 lb (90.3 kg)  05/16/20 199 lb (90.3 kg)    BP (!) 142/78   Pulse 71   Temp 98.2 F (36.8 C) (Oral)   Ht 6' (1.829 m)   Wt 201 lb (91.2 kg)   SpO2 98%   BMI 27.26 kg/m   Assessment and Plan: 1. Cervical radiculopathy Sx consistent with intermittent nerve impingement at the c-spine; most likely exacerbated by prolonged positioning such as watching TV Pt encouraged to achieve good neck support and normal alignment while sitting Can use heat or ice Take robaxin  nightly for several weeks to decrease spasm If sx are progressive, consider MRI - methocarbamol (ROBAXIN) 500 MG tablet; Take 1 tablet (500 mg total) by mouth at bedtime.  Dispense: 30 tablet; Refill: 1  2. Stage 2 moderate COPD by GOLD classification ([place]) Doing well - he continues to smoke daily Followed by Pulmonary  3. Peripheral vascular disease ([place]) Stable currently Continue daily walking  4. Coronary artery disease involving native coronary artery of native heart with angina pectoris [facility]) S/p PTCA and stents 2013 Continue anticoagulation and statin therapy   Partially dictated using Editor, commissioning. Any errors are unintentional.  Halina Maidens, MD [facility]  07/12/2020

## 2020-07-26 ENCOUNTER — Ambulatory Visit (INDEPENDENT_AMBULATORY_CARE_PROVIDER_SITE_OTHER): Payer: PPO | Admitting: Nurse Practitioner

## 2020-07-26 ENCOUNTER — Encounter (INDEPENDENT_AMBULATORY_CARE_PROVIDER_SITE_OTHER): Payer: PPO

## 2020-07-29 ENCOUNTER — Encounter (INDEPENDENT_AMBULATORY_CARE_PROVIDER_SITE_OTHER): Payer: PPO

## 2020-07-29 ENCOUNTER — Ambulatory Visit (INDEPENDENT_AMBULATORY_CARE_PROVIDER_SITE_OTHER): Payer: PPO | Admitting: Nurse Practitioner

## 2020-08-08 ENCOUNTER — Ambulatory Visit (INDEPENDENT_AMBULATORY_CARE_PROVIDER_SITE_OTHER): Payer: PPO

## 2020-08-08 ENCOUNTER — Encounter (INDEPENDENT_AMBULATORY_CARE_PROVIDER_SITE_OTHER): Payer: Self-pay | Admitting: Nurse Practitioner

## 2020-08-08 ENCOUNTER — Other Ambulatory Visit: Payer: Self-pay

## 2020-08-08 ENCOUNTER — Ambulatory Visit (INDEPENDENT_AMBULATORY_CARE_PROVIDER_SITE_OTHER): Payer: PPO | Admitting: Nurse Practitioner

## 2020-08-08 VITALS — BP 131/73 | HR 74 | Resp 16 | Wt 202.0 lb

## 2020-08-08 DIAGNOSIS — I739 Peripheral vascular disease, unspecified: Secondary | ICD-10-CM

## 2020-08-08 DIAGNOSIS — I70219 Atherosclerosis of native arteries of extremities with intermittent claudication, unspecified extremity: Secondary | ICD-10-CM | POA: Diagnosis not present

## 2020-08-08 DIAGNOSIS — I10 Essential (primary) hypertension: Secondary | ICD-10-CM

## 2020-08-08 DIAGNOSIS — E782 Mixed hyperlipidemia: Secondary | ICD-10-CM | POA: Diagnosis not present

## 2020-08-08 DIAGNOSIS — F17200 Nicotine dependence, unspecified, uncomplicated: Secondary | ICD-10-CM | POA: Diagnosis not present

## 2020-08-09 ENCOUNTER — Encounter (INDEPENDENT_AMBULATORY_CARE_PROVIDER_SITE_OTHER): Payer: Self-pay | Admitting: Nurse Practitioner

## 2020-08-09 NOTE — Progress Notes (Signed)
Subjective:    Patient ID: Aaron [name], male    DOB: 04-Dec-1955, 65 y.o.   MRN: [id number] Chief Complaint  Patient presents with  . Follow-up    3 month ABI    The patient returns to the office for followup and review of the noninvasive studies. There have been no interval changes in lower extremity symptoms. No interval shortening of the patient's claudication distance or development of rest pain symptoms. No new ulcers or wounds have occurred since the last visit.  There have been no significant changes to the patient's overall health care.  The patient denies amaurosis fugax or recent TIA symptoms. There are no recent neurological changes noted. The patient denies history of DVT, PE or superficial thrombophlebitis. The patient denies recent episodes of angina or shortness of breath.   ABI Rt=0.84 and Lt=0.97  (previous ABI's Rt=1.06 and Lt=1.07) Duplex ultrasound of the bilateral tibial arteries have biphasic waveforms with good toe waveforms bilaterally.   Review of Systems     Objective:   Physical Exam  BP 131/73 (BP Location: Right Arm)   Pulse 74   Resp 16   Wt 202 lb (91.6 kg)   BMI 27.40 kg/m   Past Medical History:  Diagnosis Date  . COPD (chronic obstructive pulmonary disease) ([place])   . Coronary artery disease    s/p PCI  . Fracture of left ankle, closed, initial encounter 10/23/2017  . GERD (gastroesophageal reflux disease)   . Glaucoma   . Gout   . Hx of heart artery stent 01/12/2017  . Hypercholesteremia   . Hypertension   . PAD (peripheral artery disease) ([place])   . Pain in limb 07/13/2016  . Syncope 09/16/2017  . Unstable angina ([place]) 01/12/2017  . Vertigo     Social History   Socioeconomic History  . Marital status: Single    Spouse name: Not on file  . Number of children: 0  . Years of education: Not on file  . Highest education level: 12th grade  Occupational History  . Occupation: Retired/Disabled  Tobacco Use  . Smoking status:  Current Every Day Smoker    Packs/day: 1.00    Years: 46.00    Pack years: 46.00    Types: Cigarettes  . Smokeless tobacco: Never Used  . Tobacco comment: chantix   Vaping Use  . Vaping Use: Never used  Substance and Sexual Activity  . Alcohol use: Yes    Comment: quit drinking around 04/2019(May have drink 1x/mo)  . Drug use: No  . Sexual activity: Not Currently    Birth control/protection: None  Other Topics Concern  . Not on file  Social History Narrative   Independent at baseline. Lives alone. Does not drive.    Social Determinants of Health   Financial Resource Strain: Low Risk   . Difficulty of Paying Living Expenses: Not hard at all  Food Insecurity: No Food Insecurity  . Worried About Charity fundraiser in the Last Year: Never true  . Ran Out of Food in the Last Year: Never true  Transportation Needs: No Transportation Needs  . Lack of Transportation (Medical): No  . Lack of Transportation (Non-Medical): No  Physical Activity: Sufficiently Active  . Days of Exercise per Week: 7 days  . Minutes of Exercise per Session: 40 min  Stress: No Stress Concern Present  . Feeling of Stress : Not at all  Social Connections: Socially Isolated  . Frequency of Communication with Friends and Family: More  than three times a week  . Frequency of Social Gatherings with Friends and Family: More than three times a week  . Attends Religious Services: Never  . Active Member of Clubs or Organizations: No  . Attends Banker Meetings: Never  . Marital Status: Never married  Intimate Partner Violence: Not At Risk  . Fear of Current or Ex-Partner: No  . Emotionally Abused: No  . Physically Abused: No  . Sexually Abused: No    Past Surgical History:  Procedure Laterality Date  . APPENDECTOMY    . cardiac stents   2012  . COLONOSCOPY  03/30/2013   [place]  . COLONOSCOPY WITH PROPOFOL N/A 12/10/2019   Procedure: COLONOSCOPY WITH PROPOFOL;  Surgeon: Midge Minium, MD;   Location: [facility];  Service: Endoscopy;  Laterality: N/A;  . COLONOSCOPY WITH PROPOFOL N/A 01/18/2020   Procedure: COLONOSCOPY WITH PROPOFOL;  Surgeon: Midge Minium, MD;  Location: [facility];  Service: Endoscopy;  Laterality: N/A;  4  . LOWER EXTREMITY ANGIOGRAPHY Right 09/25/2018   Procedure: LOWER EXTREMITY ANGIOGRAPHY;  Surgeon: Annice Needy, MD;  Location: [facility];  Service: Cardiovascular;  Laterality: Right;  . LOWER EXTREMITY ANGIOGRAPHY Left 03/17/2020   Procedure: LOWER EXTREMITY ANGIOGRAPHY;  Surgeon: Annice Needy, MD;  Location: [facility] INVASIVE CV LAB;  Service: Cardiovascular;  Laterality: Left;  . POLYPECTOMY N/A 01/18/2020   Procedure: POLYPECTOMY;  Surgeon: Midge Minium, MD;  Location: [facility];  Service: Endoscopy;  Laterality: N/A;    Family History  Problem Relation Age of Onset  . Hypertension Mother   . Diabetes Mother   . Heart failure Father        age 25  . Cancer Father   . Ovarian cancer Sister   . Heart disease Brother     Allergies  Allergen Reactions  . Contrast Media [Iodinated Diagnostic Agents] Hives  . Iodine Hives    CBC Latest Ref Rng & Units 11/09/2019 11/05/2019 07/21/2019  WBC 4.0 - 10.5 K/uL 6.6 7.2 8.6  Hemoglobin 13.0 - 17.0 g/dL 71.0 62.6 94.8  Hematocrit 39.0 - 52.0 % 45.3 47.4 47.6  Platelets 150 - 400 K/uL 208 236 222      CMP     Component Value Date/Time   NA 132 (L) 11/09/2019 0806   NA 135 11/05/2019 1027   NA 137 07/02/2014 0513   K 4.3 11/09/2019 0806   K 4.0 07/02/2014 0513   CL 98 11/09/2019 0806   CL 103 07/02/2014 0513   CO2 23 11/09/2019 0806   CO2 26 07/02/2014 0513   GLUCOSE 132 (H) 11/09/2019 0806   GLUCOSE 109 (H) 07/02/2014 0513   BUN 19 03/17/2020 0829   BUN 22 11/05/2019 1027   BUN 15 07/02/2014 0513   CREATININE 0.93 03/17/2020 0829   CREATININE 0.81 07/02/2014 0513   CALCIUM 9.6 11/09/2019 0806   CALCIUM 8.7 07/02/2014 0513   PROT 7.8 11/09/2019 0806   PROT 7.4  11/05/2019 1027   PROT 7.9 12/03/2011 2127   ALBUMIN 4.4 11/09/2019 0806   ALBUMIN 4.7 11/05/2019 1027   ALBUMIN 4.1 12/03/2011 2127   AST 31 11/09/2019 0806   AST 23 12/03/2011 2127   ALT 40 11/09/2019 0806   ALT 22 12/03/2011 2127   ALKPHOS 52 11/09/2019 0806   ALKPHOS 90 12/03/2011 2127   BILITOT 0.8 11/09/2019 0806   BILITOT 0.5 11/05/2019 1027   BILITOT 0.2 12/03/2011 2127   GFRNONAA >60 03/17/2020 5462  GFRNONAA >60 07/02/2014 0513   GFRNONAA >60 11/07/2012 0442   GFRAA >60 03/17/2020 0829   GFRAA >60 07/02/2014 0513   GFRAA >60 11/07/2012 0442     No results found.     Assessment & Plan:   1. Atherosclerosis of native artery of extremity with intermittent claudication, unspecified extremity ([place])  Recommend:  The patient has evidence of atherosclerosis of the lower extremities with claudication.  The patient does not voice lifestyle limiting changes at this point in time.  Noninvasive studies do not suggest clinically significant change.  No invasive studies, angiography or surgery at this time The patient should continue walking and begin a more formal exercise program.  The patient should continue antiplatelet therapy and aggressive treatment of the lipid abnormalities  No changes in the patient's medications at this time  The patient should continue wearing graduated compression socks 10-15 mmHg strength to control the mild edema.   Follow up in 6 months 2. Tobacco use disorder Smoking cessation was discussed, 3-10 minutes spent on this topic specifically. Patient is working to stop with Chantix   3. Mixed hyperlipidemia Continue statin as ordered and reviewed, no changes at this time   4. Essential (primary) hypertension Continue antihypertensive medications as already ordered, these medications have been reviewed and there are no changes at this time.    Current Outpatient Medications on File Prior to Visit  Medication Sig Dispense Refill  .  albuterol (VENTOLIN HFA) 108 (90 Base) MCG/ACT inhaler Inhale 2 puffs into the lungs every 6 (six) hours as needed for wheezing or shortness of breath. 6.7 g 5  . aspirin EC 81 MG tablet Take 1 tablet (81 mg total) by mouth daily. 150 tablet 2  . cilostazol (PLETAL) 50 MG tablet Take by mouth 2 (two) times daily.     . clopidogrel (PLAVIX) 75 MG tablet Take 1 tablet by mouth daily.    . indomethacin (INDOCIN) 25 MG capsule Take 1 capsule (25 mg total) by mouth 2 (two) times daily as needed. 30 capsule 1  . isosorbide mononitrate (IMDUR) 120 MG 24 hr tablet Take by mouth.    [name] lisinopril (PRINIVIL,ZESTRIL) 10 MG tablet Take 1 tablet by mouth daily.    . methocarbamol (ROBAXIN) 500 MG tablet Take 1 tablet (500 mg total) by mouth at bedtime. 30 tablet 1  . metoprolol tartrate (LOPRESSOR) 50 MG tablet Take 50-75 mg by mouth 2 (two) times a day. Take 50MG  by mouth every morning and 75MG  every evening    . nitroGLYCERIN (NITROSTAT) 0.4 MG SL tablet Place 1 tablet under the tongue as needed.    . pantoprazole (PROTONIX) 40 MG tablet Take 1 tablet (40 mg total) by mouth daily. 30 tablet 3  . simvastatin (ZOCOR) 40 MG tablet Take 1 tablet (40 mg total) by mouth at bedtime. 30 tablet 5  . varenicline (CHANTIX) 0.5 MG tablet Take 0.5 mg by mouth 2 (two) times daily.     No current facility-administered medications on file prior to visit.    There are no Patient Instructions on file for this visit. No follow-ups on file.   Kris Hartmann, NP

## 2020-08-26 ENCOUNTER — Ambulatory Visit: Payer: PPO | Admitting: Internal Medicine

## 2020-08-26 NOTE — Progress Notes (Deleted)
Date:  08/26/2020   Name:  Aaron [name]   DOB:  1955/10/12   MRN:  [id number]   Chief Complaint: No chief complaint on file.  Rash    Lab Results  Component Value Date   CREATININE 0.93 03/17/2020   BUN 19 03/17/2020   NA 132 (L) 11/09/2019   K 4.3 11/09/2019   CL 98 11/09/2019   CO2 23 11/09/2019   Lab Results  Component Value Date   CHOL 146 11/05/2019   HDL 38 (L) 11/05/2019   LDLCALC 82 11/05/2019   TRIG 149 11/05/2019   CHOLHDL 3.8 11/05/2019   Lab Results  Component Value Date   TSH 3.214 02/06/2019   Lab Results  Component Value Date   HGBA1C 5.9 (H) 05/16/2020   Lab Results  Component Value Date   WBC 6.6 11/09/2019   HGB 16.3 11/09/2019   HCT 45.3 11/09/2019   MCV 93.0 11/09/2019   PLT 208 11/09/2019   Lab Results  Component Value Date   ALT 40 11/09/2019   AST 31 11/09/2019   ALKPHOS 52 11/09/2019   BILITOT 0.8 11/09/2019     Review of Systems  Skin: Positive for rash.    Patient Active Problem List   Diagnosis Date Noted  . Aortic atherosclerosis ([place]) 07/10/2020  . Stage 2 moderate COPD by GOLD classification ([place]) 05/16/2020  . Dyspepsia and disorder of function of stomach 05/16/2020  . Cervical disc disorder of mid-cervical region 05/10/2020  . Polyp of descending colon   . History of colonic polyps   . Alcohol dependence ([place]) 07/21/2019  . Mixed hyperlipidemia 10/28/2018  . Tobacco use disorder 07/13/2016  . Prediabetes 07/27/2015  . Controlled gout 12/22/2014  . Coronary artery disease involving native coronary artery of native heart with angina pectoris ([place]) 10/09/2014  . Essential (primary) hypertension 10/09/2014  . Dupuytren's disease of palm 10/09/2014  . Psoriasis 10/09/2014  . Pain in shoulder 10/09/2014  . H/O cardiac catheterization 01/27/2014  . Peripheral vascular disease ([place]) 01/27/2014    Allergies  Allergen Reactions  . Contrast Media [Iodinated Diagnostic Agents] Hives  . Iodine Hives    Past  Surgical History:  Procedure Laterality Date  . APPENDECTOMY    . cardiac stents   2012  . COLONOSCOPY  03/30/2013   [place]  . COLONOSCOPY WITH PROPOFOL N/A 12/10/2019   Procedure: COLONOSCOPY WITH PROPOFOL;  Surgeon: [name], MD;  Location: [place];  Service: Endoscopy;  Laterality: N/A;  . COLONOSCOPY WITH PROPOFOL N/A 01/18/2020   Procedure: COLONOSCOPY WITH PROPOFOL;  Surgeon: [name], MD;  Location: [place];  Service: Endoscopy;  Laterality: N/A;  4  . LOWER EXTREMITY ANGIOGRAPHY Right 09/25/2018   Procedure: LOWER EXTREMITY ANGIOGRAPHY;  Surgeon: Algernon Huxley, MD;  Location: [place] [facility];  Service: Cardiovascular;  Laterality: Right;  . LOWER EXTREMITY ANGIOGRAPHY Left 03/17/2020   Procedure: LOWER EXTREMITY ANGIOGRAPHY;  Surgeon: Algernon Huxley, MD;  Location: [place] [facility];  Service: Cardiovascular;  Laterality: Left;  . POLYPECTOMY N/A 01/18/2020   Procedure: POLYPECTOMY;  Surgeon: [name], MD;  Location: [place];  Service: Endoscopy;  Laterality: N/A;    Social History   Tobacco Use  . Smoking status: Current Every Day Smoker    Packs/day: 1.00    Years: 46.00    Pack years: 46.00    Types: Cigarettes  . Smokeless tobacco: Never Used  . Tobacco comment: chantix   Vaping Use  .  Vaping Use: Never used  Substance Use Topics  . Alcohol use: Yes    Comment: quit drinking around 04/2019(May have drink 1x/mo)  . Drug use: No     Medication list has been reviewed and updated.  No outpatient medications have been marked as taking for the 08/26/20 encounter (Appointment) with Glean Hess, MD.    [facility] 2/9 Scores 07/12/2020 05/16/2020 05/10/2020 04/27/2020  PHQ - 2 Score 0 0 0 0  PHQ- 9 Score 0 0 0 -    GAD 7 : Generalized Anxiety Score 07/12/2020 05/16/2020 05/10/2020  Nervous, Anxious, on Edge 0 0 0  Control/stop worrying 0 0 0  Worry too much - different things 0 0 0  Trouble relaxing 0 0 0  Restless 0 0 0   Easily annoyed or irritable 0 0 0  Afraid - awful might happen 0 0 0  Total GAD 7 Score 0 0 0  Anxiety Difficulty - Not difficult at all Not difficult at all    BP Readings from Last 3 Encounters:  08/08/20 131/73  07/12/20 140/78  05/16/20 112/72    Physical Exam  Wt Readings from Last 3 Encounters:  08/08/20 202 lb (91.6 kg)  07/12/20 201 lb (91.2 kg)  06/29/20 199 lb (90.3 kg)    There were no vitals taken for this visit.  Assessment and Plan:

## 2020-08-30 DIAGNOSIS — F17218 Nicotine dependence, cigarettes, with other nicotine-induced disorders: Secondary | ICD-10-CM | POA: Diagnosis not present

## 2020-09-04 IMAGING — US US ABDOMEN COMPLETE
1 series · 14 of 25 positions shown · non-contrast
Comparison: [id number]

CLINICAL DATA: Epigastric pain

EXAM:
ABDOMEN ULTRASOUND COMPLETE

[Series 1: us abdomen complete · 0.24mm/px · 14 of 95 slices shown]
[im 1/95]
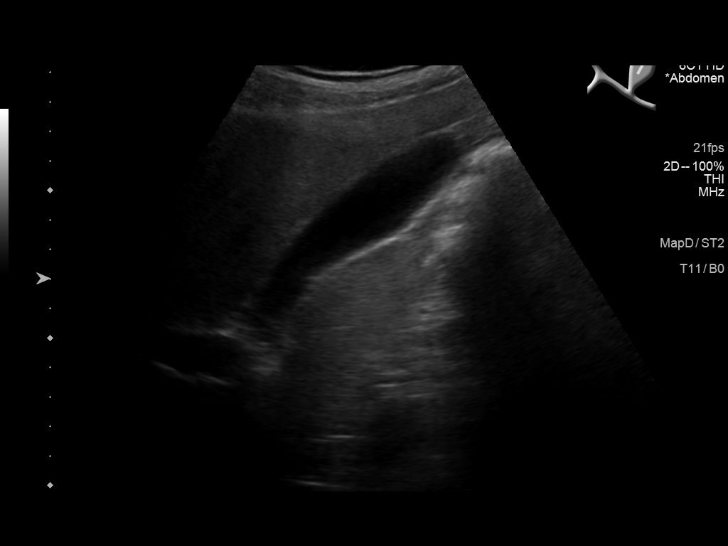
[im 8/95]
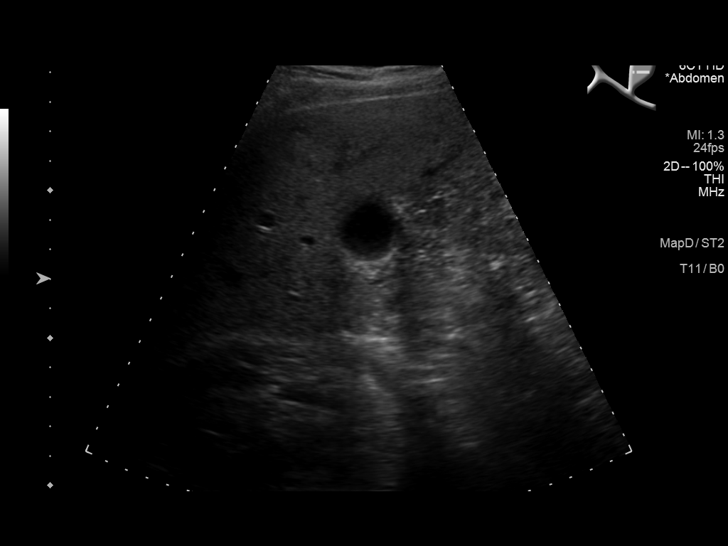
[im 16/95]
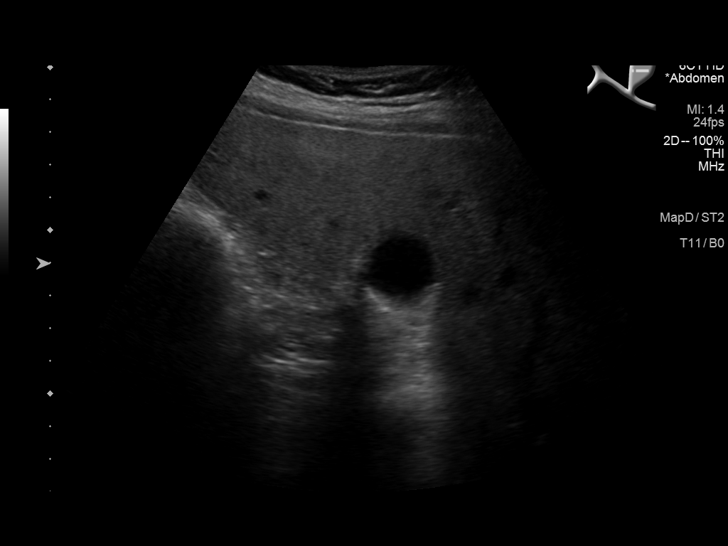
[im 24/95]
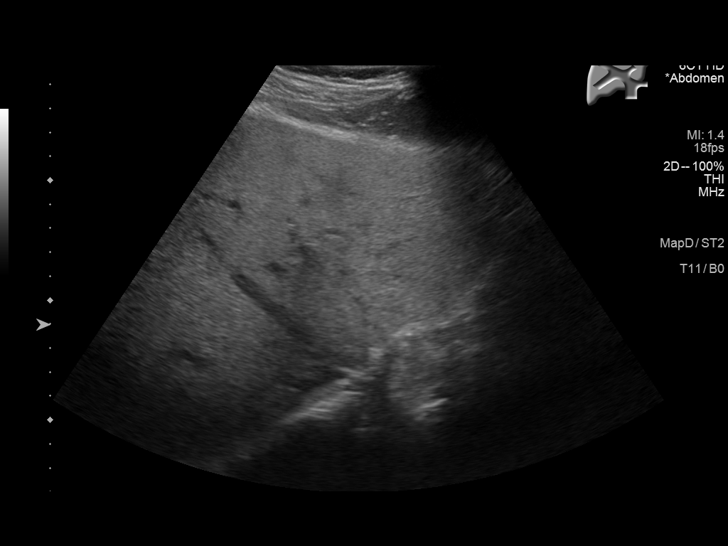
[im 32/95]
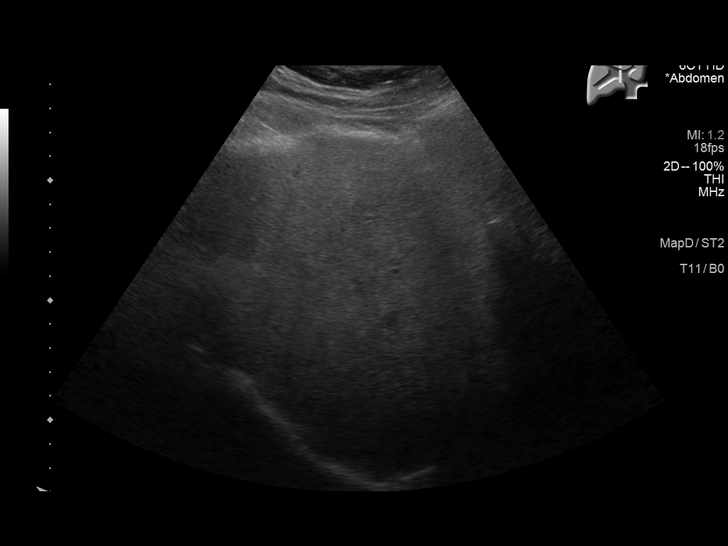
[im 36/95]
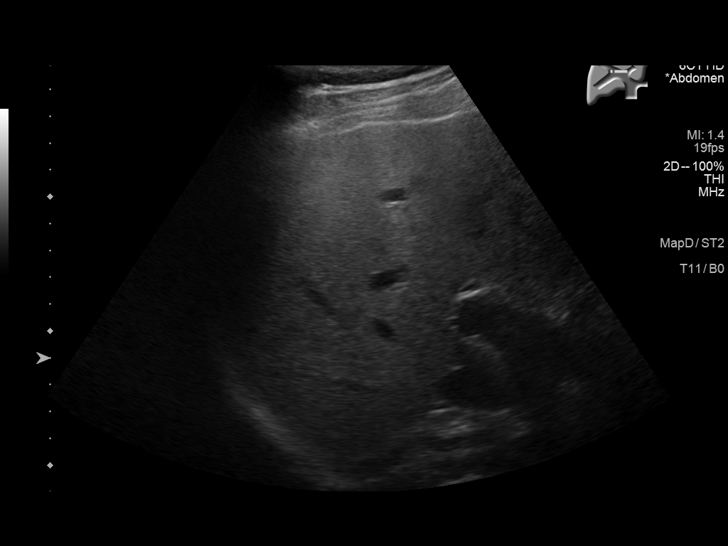
[im 44/95]
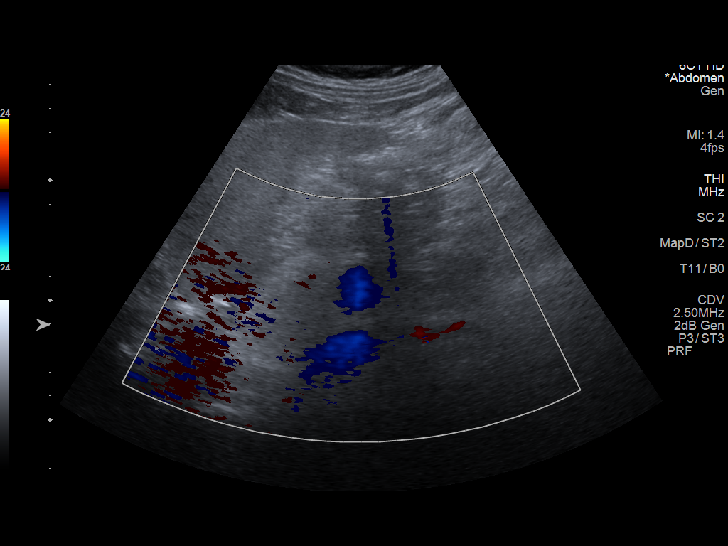
[im 51/95]
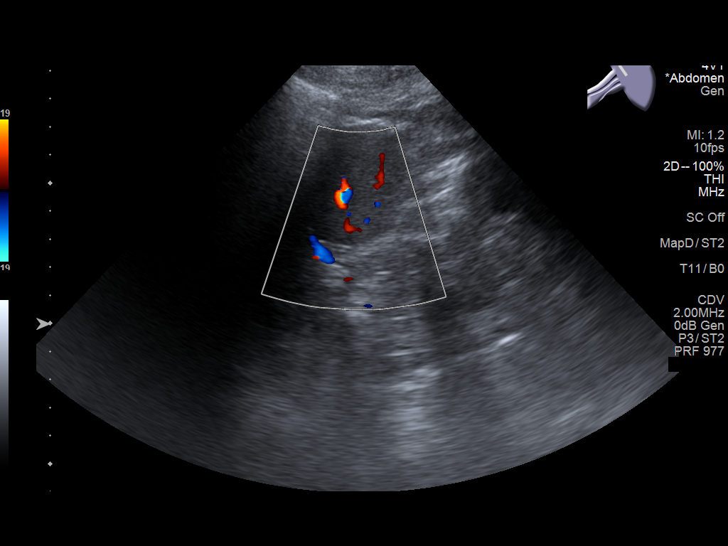
[im 59/95]
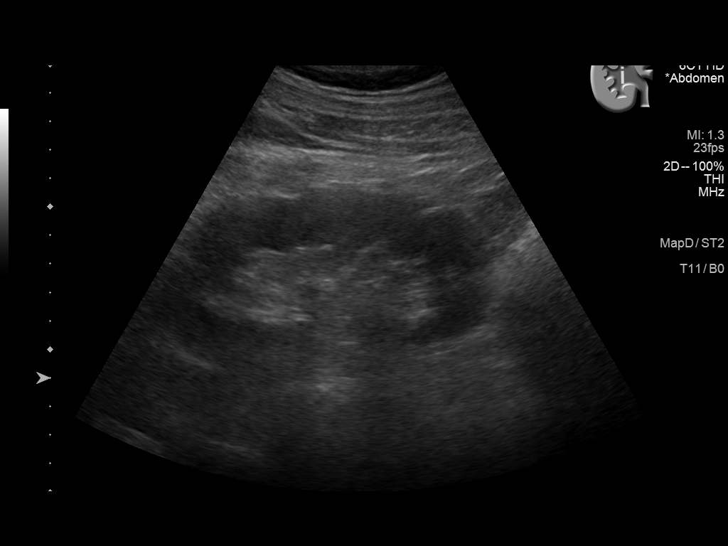
[im 63/95]
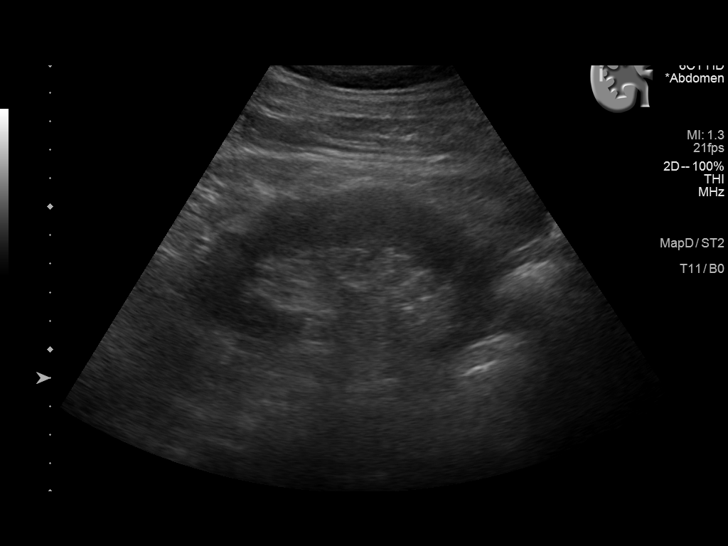
[im 71/95]
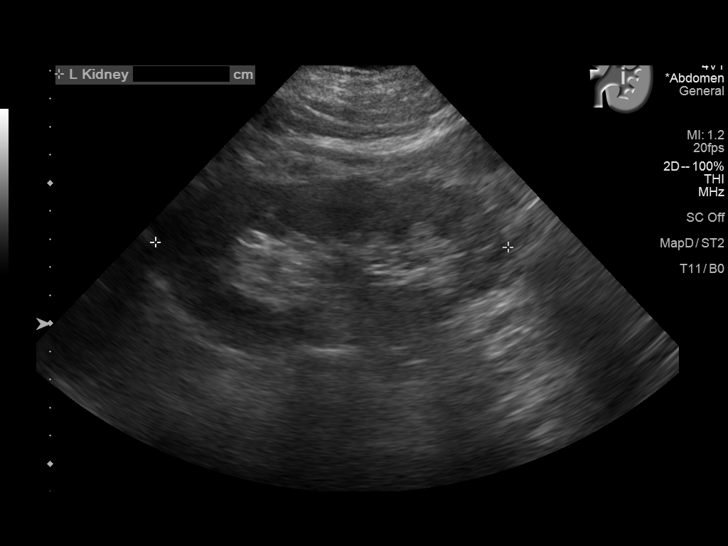
[im 79/95]
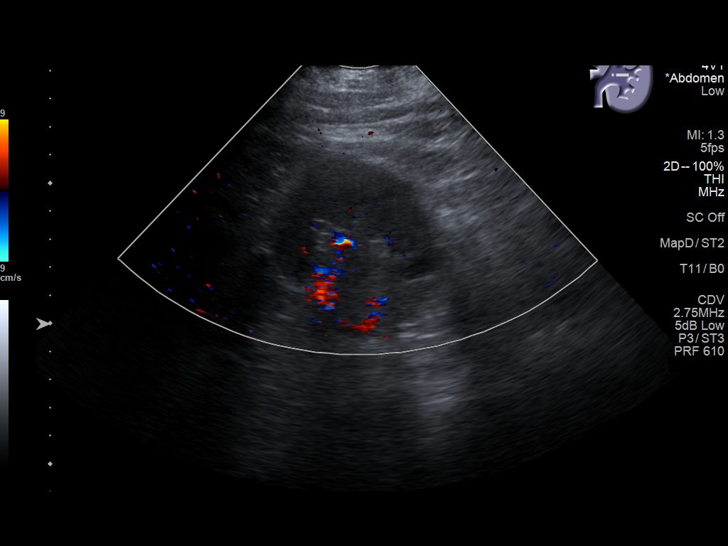
[im 87/95]
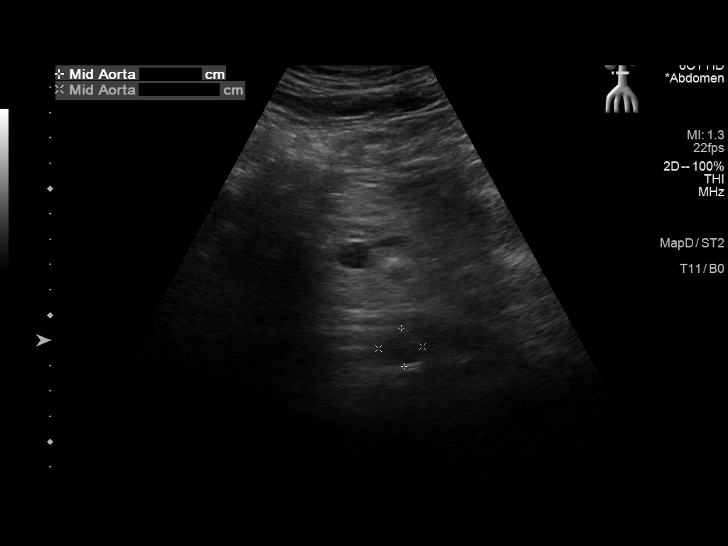
[im 95/95]
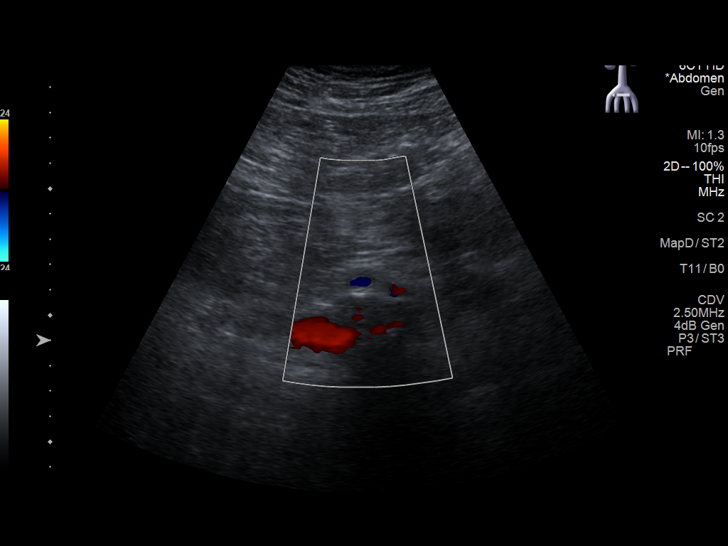

[14 of 25 positions shown; findings below may reference images not displayed]

FINDINGS: Gallbladder: No gallstones or wall thickening visualized. No
sonographic Murphy sign noted by sonographer.

Common bile duct: Diameter: 6 mm

Liver: No focal lesion identified. Increased parenchymal
echogenicity. Portal vein is patent on color Doppler imaging with
normal direction of blood flow towards the liver.

IVC: No abnormality visualized.

Pancreas: Visualized portion unremarkable.

Spleen: Size and appearance within normal limits.

Right Kidney: Length: 11.5 cm. Echogenicity within normal limits. No
mass or hydronephrosis visualized.

Left Kidney: Length: 12.6 cm. Echogenicity within normal limits. No
mass or hydronephrosis visualized.

Abdominal aorta: No aneurysm visualized.

Other findings: None.
IMPRESSION: Increased liver echogenicity, which may reflect steatosis. Otherwise
unremarkable.

## 2020-09-07 ENCOUNTER — Other Ambulatory Visit: Payer: Self-pay | Admitting: Internal Medicine

## 2020-09-07 DIAGNOSIS — M109 Gout, unspecified: Secondary | ICD-10-CM

## 2020-09-07 MED ORDER — INDOMETHACIN 25 MG PO CAPS: 25.0000 mg | ORAL_CAPSULE | Freq: Two times a day (BID) | ORAL | 1 refills | Status: DC | PRN

## 2020-09-07 NOTE — Telephone Encounter (Signed)
Medication Refill - Medication: indomethacin (INDOCIN) 25 MG capsule    Has the patient contacted their pharmacy? Yes.   (Agent: If no, request that the patient contact the pharmacy for the refill.) (Agent: If yes, when and what did the pharmacy advise?)needs new RX  Preferred Pharmacy (with phone number or street name): [facility] [place] [facility] [address], [place] [address]  Phone:  [phone] Fax:  [phone]   Agent: Please be advised that RX refills may take up to 3 business days. We ask that you follow-up with your pharmacy.

## 2020-09-16 ENCOUNTER — Ambulatory Visit (INDEPENDENT_AMBULATORY_CARE_PROVIDER_SITE_OTHER): Payer: PPO | Admitting: Internal Medicine

## 2020-09-16 ENCOUNTER — Encounter: Payer: Self-pay | Admitting: Internal Medicine

## 2020-09-16 ENCOUNTER — Other Ambulatory Visit: Payer: Self-pay

## 2020-09-16 VITALS — BP 112/58 | HR 85 | Temp 97.7°F | Ht 72.0 in | Wt 198.0 lb

## 2020-09-16 DIAGNOSIS — H40003 Preglaucoma, unspecified, bilateral: Secondary | ICD-10-CM | POA: Diagnosis not present

## 2020-09-16 DIAGNOSIS — R21 Rash and other nonspecific skin eruption: Secondary | ICD-10-CM

## 2020-09-16 NOTE — Progress Notes (Signed)
Date:  09/16/2020   Name:  Aaron [name]   DOB:  May 18, 1956   MRN:  [id number]   Chief Complaint: Rash (X1 year, Wants RF to derm., bottom both sides, itching, burning, redness, used cortisone, doesn't completely go away )  Rash This is a chronic problem. The current episode started more than 1 year ago. The problem has been waxing and waning since onset. The affected locations include the groin, left buttock and right buttock. The rash is characterized by redness, scaling, itchiness and dryness. He was exposed to nothing. Pertinent negatives include no fever or shortness of breath. Past treatments include antibiotic cream, antihistamine, topical steroids and anti-itch cream. The treatment provided mild relief.    Lab Results  Component Value Date   CREATININE 0.93 03/17/2020   BUN 19 03/17/2020   NA 132 (L) 11/09/2019   K 4.3 11/09/2019   CL 98 11/09/2019   CO2 23 11/09/2019   Lab Results  Component Value Date   CHOL 146 11/05/2019   HDL 38 (L) 11/05/2019   LDLCALC 82 11/05/2019   TRIG 149 11/05/2019   CHOLHDL 3.8 11/05/2019   Lab Results  Component Value Date   TSH 3.214 02/06/2019   Lab Results  Component Value Date   HGBA1C 5.9 (H) 05/16/2020   Lab Results  Component Value Date   WBC 6.6 11/09/2019   HGB 16.3 11/09/2019   HCT 45.3 11/09/2019   MCV 93.0 11/09/2019   PLT 208 11/09/2019   Lab Results  Component Value Date   ALT 40 11/09/2019   AST 31 11/09/2019   ALKPHOS 52 11/09/2019   BILITOT 0.8 11/09/2019     Review of Systems  Constitutional: Negative for chills and fever.  Respiratory: Negative for chest tightness and shortness of breath.   Cardiovascular: Negative for chest pain.  Skin: Positive for rash.    Patient Active Problem List   Diagnosis Date Noted  . Aortic atherosclerosis ([place]) 07/10/2020  . Stage 2 moderate COPD by GOLD classification ([place]) 05/16/2020  . Dyspepsia and disorder of function of stomach 05/16/2020  . Cervical  disc disorder of mid-cervical region 05/10/2020  . Polyp of descending colon   . History of colonic polyps   . Alcohol dependence ([place]) 07/21/2019  . Mixed hyperlipidemia 10/28/2018  . Tobacco use disorder 07/13/2016  . Prediabetes 07/27/2015  . Controlled gout 12/22/2014  . Coronary artery disease involving native coronary artery of native heart with angina pectoris ([place]) 10/09/2014  . Essential (primary) hypertension 10/09/2014  . Dupuytren's disease of palm 10/09/2014  . Psoriasis 10/09/2014  . Pain in shoulder 10/09/2014  . H/O cardiac catheterization 01/27/2014  . Peripheral vascular disease ([place]) 01/27/2014    Allergies  Allergen Reactions  . Contrast Media [Iodinated Diagnostic Agents] Hives  . Iodine Hives    Past Surgical History:  Procedure Laterality Date  . APPENDECTOMY    . cardiac stents   2012  . COLONOSCOPY  03/30/2013   [place]  . COLONOSCOPY WITH PROPOFOL N/A 12/10/2019   Procedure: COLONOSCOPY WITH PROPOFOL;  Surgeon: [name], MD;  Location: [place];  Service: Endoscopy;  Laterality: N/A;  . COLONOSCOPY WITH PROPOFOL N/A 01/18/2020   Procedure: COLONOSCOPY WITH PROPOFOL;  Surgeon: [name], MD;  Location: [place];  Service: Endoscopy;  Laterality: N/A;  4  . LOWER EXTREMITY ANGIOGRAPHY Right 09/25/2018   Procedure: LOWER EXTREMITY ANGIOGRAPHY;  Surgeon: Algernon Huxley, MD;  Location: [place] [facility];  Service: Cardiovascular;  Laterality: Right;  . LOWER EXTREMITY ANGIOGRAPHY Left 03/17/2020   Procedure: LOWER EXTREMITY ANGIOGRAPHY;  Surgeon: Algernon Huxley, MD;  Location: [place] [facility];  Service: Cardiovascular;  Laterality: Left;  . POLYPECTOMY N/A 01/18/2020   Procedure: POLYPECTOMY;  Surgeon: [name], MD;  Location: [place];  Service: Endoscopy;  Laterality: N/A;    Social History   Tobacco Use  . Smoking status: Current Every Day Smoker    Packs/day: 1.00    Years: 46.00    Pack years: 46.00     Types: Cigarettes  . Smokeless tobacco: Never Used  . Tobacco comment: chantix   Vaping Use  . Vaping Use: Never used  Substance Use Topics  . Alcohol use: Yes    Comment: quit drinking around 04/2019(May have drink 1x/mo)  . Drug use: No     Medication list has been reviewed and updated.  Current Meds  Medication Sig  . albuterol (VENTOLIN HFA) 108 (90 Base) MCG/ACT inhaler Inhale 2 puffs into the lungs every 6 (six) hours as needed for wheezing or shortness of breath.  [name] aspirin EC 81 MG tablet Take 1 tablet (81 mg total) by mouth daily.  . cilostazol (PLETAL) 50 MG tablet Take by mouth 2 (two) times daily.   . clopidogrel (PLAVIX) 75 MG tablet Take 1 tablet by mouth daily.  . indomethacin (INDOCIN) 25 MG capsule Take 1 capsule (25 mg total) by mouth 2 (two) times daily as needed.  . isosorbide mononitrate (IMDUR) 120 MG 24 hr tablet Take by mouth.  [name] lisinopril (PRINIVIL,ZESTRIL) 10 MG tablet Take 1 tablet by mouth daily.  . methocarbamol (ROBAXIN) 500 MG tablet Take 1 tablet (500 mg total) by mouth at bedtime.  . metoprolol tartrate (LOPRESSOR) 50 MG tablet Take 50-75 mg by mouth 2 (two) times a day. Take 50MG  by mouth every morning and 75MG  every evening  . nitroGLYCERIN (NITROSTAT) 0.4 MG SL tablet Place 1 tablet under the tongue as needed.  . pantoprazole (PROTONIX) 40 MG tablet Take 1 tablet (40 mg total) by mouth daily.  . simvastatin (ZOCOR) 40 MG tablet Take 1 tablet (40 mg total) by mouth at bedtime.    PHQ 2/9 Scores 09/16/2020 07/12/2020 05/16/2020 05/10/2020  PHQ - 2 Score 0 0 0 0  PHQ- 9 Score 0 0 0 0    GAD 7 : Generalized Anxiety Score 09/16/2020 07/12/2020 05/16/2020 05/10/2020  Nervous, Anxious, on Edge 0 0 0 0  Control/stop worrying 0 0 0 0  Worry too much - different things 0 0 0 0  Trouble relaxing 0 0 0 0  Restless 0 0 0 0  Easily annoyed or irritable 0 0 0 0  Afraid - awful might happen 0 0 0 0  Total GAD 7 Score 0 0 0 0  Anxiety Difficulty - - Not difficult  at all Not difficult at all    BP Readings from Last 3 Encounters:  09/16/20 (!) 112/58  08/08/20 131/73  07/12/20 140/78    Physical Exam Vitals and nursing note reviewed.  Constitutional:      General: He is not in acute distress.    Appearance: He is well-developed.  HENT:     Head: Normocephalic and atraumatic.  Pulmonary:     Effort: Pulmonary effort is normal. No respiratory distress.  Skin:    General: Skin is warm and dry.     Findings: Rash present.          Comments: Erythematous patches with  mild thickening - no blisters or scaling  Neurological:     Mental Status: He is alert and oriented to person, place, and time.  Psychiatric:        Mood and Affect: Mood normal.        Behavior: Behavior normal.     Wt Readings from Last 3 Encounters:  09/16/20 198 lb (89.8 kg)  08/08/20 202 lb (91.6 kg)  07/12/20 201 lb (91.2 kg)    BP (!) 112/58   Pulse 85   Temp 97.7 F (36.5 C) (Oral)   Ht 6' (1.829 m)   Wt 198 lb (89.8 kg)   SpO2 98%   BMI 26.85 kg/m   Assessment and Plan: 1. Rash and nonspecific skin eruption Continue moisturizers for now and refer to Dermatology - Ambulatory referral to Dermatology   Partially dictated using Dragon software. Any errors are unintentional.  Halina Maidens, MD [facility]  09/16/2020

## 2020-09-28 DIAGNOSIS — L300 Nummular dermatitis: Secondary | ICD-10-CM | POA: Diagnosis not present

## 2020-11-08 ENCOUNTER — Encounter: Payer: PPO | Admitting: Internal Medicine

## 2020-11-24 DIAGNOSIS — L300 Nummular dermatitis: Secondary | ICD-10-CM | POA: Diagnosis not present

## 2020-11-24 NOTE — Progress Notes (Deleted)
Date:  11/25/2020   Name:  Aaron [name]   DOB:  [birth year]   MRN:  [id number]   Chief Complaint: No chief complaint on file.  Aaron [name] is a 65 y.o. male who presents today for his Complete Annual Exam. He feels {DESC; WELL/FAIRLY WELL/POORLY:18703}. He reports exercising ***. He reports he is sleeping {DESC; WELL/FAIRLY WELL/POORLY:18703}.   Colonoscopy: 01/2020  Immunization History  Administered Date(s) Administered  . Influenza,inj,Quad PF,6+ Mos 05/19/2015, 04/06/2016, 04/02/2017, 03/24/2018, 04/01/2019, 04/14/2020  . PFIZER(Purple Top)SARS-COV-2 Vaccination 08/25/2019, 09/15/2019, 06/13/2020  . Pneumococcal Polysaccharide-23 11/14/2012, 07/03/2014  . Tdap 12/18/2013     Hypertension This is a chronic problem. The problem is controlled. Past treatments include beta blockers and ACE inhibitors. The current treatment provides significant improvement. Hypertensive end-organ damage includes CAD/MI.  Hyperlipidemia This is a chronic problem. The problem is controlled. Current antihyperlipidemic treatment includes statins. The current treatment provides significant improvement of lipids.  Diabetes He presents for his follow-up diabetic visit. Diabetes type: prediabetes. An ACE inhibitor/angiotensin II receptor blocker is being taken.  COPD -   Lab Results  Component Value Date   CREATININE 0.93 03/17/2020   BUN 19 03/17/2020   NA 132 (L) 11/09/2019   K 4.3 11/09/2019   CL 98 11/09/2019   CO2 23 11/09/2019   Lab Results  Component Value Date   CHOL 146 11/05/2019   HDL 38 (L) 11/05/2019   LDLCALC 82 11/05/2019   TRIG 149 11/05/2019   CHOLHDL 3.8 11/05/2019   Lab Results  Component Value Date   TSH 3.214 02/06/2019   Lab Results  Component Value Date   HGBA1C 5.9 (H) 05/16/2020   Lab Results  Component Value Date   WBC 6.6 11/09/2019   HGB 16.3 11/09/2019   HCT 45.3 11/09/2019   MCV 93.0 11/09/2019   PLT 208 11/09/2019   Lab Results  Component  Value Date   ALT 40 11/09/2019   AST 31 11/09/2019   ALKPHOS 52 11/09/2019   BILITOT 0.8 11/09/2019     Review of Systems  Patient Active Problem List   Diagnosis Date Noted  . Aortic atherosclerosis ([place]) 07/10/2020  . Stage 2 moderate COPD by GOLD classification ([place]) 05/16/2020  . Dyspepsia and disorder of function of stomach 05/16/2020  . Cervical disc disorder of mid-cervical region 05/10/2020  . Polyp of descending colon   . History of colonic polyps   . Alcohol dependence ([place]) 07/21/2019  . Mixed hyperlipidemia 10/28/2018  . Tobacco use disorder 07/13/2016  . Prediabetes 07/27/2015  . Controlled gout 12/22/2014  . Coronary artery disease involving native coronary artery of native heart with angina pectoris ([place]) 10/09/2014  . Essential (primary) hypertension 10/09/2014  . Dupuytren's disease of palm 10/09/2014  . Psoriasis 10/09/2014  . Pain in shoulder 10/09/2014  . H/O cardiac catheterization 01/27/2014  . Peripheral vascular disease ([place]) 01/27/2014    Allergies  Allergen Reactions  . Contrast Media [Iodinated Diagnostic Agents] Hives  . Iodine Hives    Past Surgical History:  Procedure Laterality Date  . APPENDECTOMY    . cardiac stents   2012  . COLONOSCOPY  03/30/2013   [place]  . COLONOSCOPY WITH PROPOFOL N/A 12/10/2019   Procedure: COLONOSCOPY WITH PROPOFOL;  Surgeon: [name], MD;  Location: [place];  Service: Endoscopy;  Laterality: N/A;  . COLONOSCOPY WITH PROPOFOL N/A 01/18/2020   Procedure: COLONOSCOPY WITH PROPOFOL;  Surgeon: [name], MD;  Location: [place];  Service: Endoscopy;  Laterality:  N/A;  4  . LOWER EXTREMITY ANGIOGRAPHY Right 09/25/2018   Procedure: LOWER EXTREMITY ANGIOGRAPHY;  Surgeon: Algernon Huxley, MD;  Location: [place] [facility];  Service: Cardiovascular;  Laterality: Right;  . LOWER EXTREMITY ANGIOGRAPHY Left 03/17/2020   Procedure: LOWER EXTREMITY ANGIOGRAPHY;  Surgeon: Algernon Huxley, MD;   Location: [place] [facility];  Service: Cardiovascular;  Laterality: Left;  . POLYPECTOMY N/A 01/18/2020   Procedure: POLYPECTOMY;  Surgeon: [name], MD;  Location: [place];  Service: Endoscopy;  Laterality: N/A;    Social History   Tobacco Use  . Smoking status: Current Every Day Smoker    Packs/day: 1.00    Years: 46.00    Pack years: 46.00    Types: Cigarettes  . Smokeless tobacco: Never Used  . Tobacco comment: chantix   Vaping Use  . Vaping Use: Never used  Substance Use Topics  . Alcohol use: Yes    Comment: quit drinking around 04/2019(May have drink 1x/mo)  . Drug use: No     Medication list has been reviewed and updated.  No outpatient medications have been marked as taking for the 11/25/20 encounter (Appointment) with Glean Hess, MD.    [facility] 2/9 Scores 09/16/2020 07/12/2020 05/16/2020 05/10/2020  PHQ - 2 Score 0 0 0 0  PHQ- 9 Score 0 0 0 0    GAD 7 : Generalized Anxiety Score 09/16/2020 07/12/2020 05/16/2020 05/10/2020  Nervous, Anxious, on Edge 0 0 0 0  Control/stop worrying 0 0 0 0  Worry too much - different things 0 0 0 0  Trouble relaxing 0 0 0 0  Restless 0 0 0 0  Easily annoyed or irritable 0 0 0 0  Afraid - awful might happen 0 0 0 0  Total GAD 7 Score 0 0 0 0  Anxiety Difficulty - - Not difficult at all Not difficult at all    BP Readings from Last 3 Encounters:  09/16/20 (!) 112/58  08/08/20 131/73  07/12/20 140/78    Physical Exam  Wt Readings from Last 3 Encounters:  09/16/20 198 lb (89.8 kg)  08/08/20 202 lb (91.6 kg)  07/12/20 201 lb (91.2 kg)    There were no vitals taken for this visit.  Assessment and Plan:

## 2020-11-25 ENCOUNTER — Encounter: Payer: PPO | Admitting: Internal Medicine

## 2020-11-25 DIAGNOSIS — Z23 Encounter for immunization: Secondary | ICD-10-CM

## 2020-11-25 DIAGNOSIS — R7303 Prediabetes: Secondary | ICD-10-CM

## 2020-11-25 DIAGNOSIS — J449 Chronic obstructive pulmonary disease, unspecified: Secondary | ICD-10-CM

## 2020-11-25 DIAGNOSIS — Z125 Encounter for screening for malignant neoplasm of prostate: Secondary | ICD-10-CM

## 2020-11-25 DIAGNOSIS — I10 Essential (primary) hypertension: Secondary | ICD-10-CM

## 2020-11-25 DIAGNOSIS — E782 Mixed hyperlipidemia: Secondary | ICD-10-CM

## 2020-11-25 DIAGNOSIS — Z0000 Encounter for general adult medical examination without abnormal findings: Secondary | ICD-10-CM

## 2020-11-25 DIAGNOSIS — K319 Disease of stomach and duodenum, unspecified: Secondary | ICD-10-CM

## 2020-12-27 ENCOUNTER — Emergency Department: Payer: PPO

## 2020-12-27 ENCOUNTER — Inpatient Hospital Stay
Admission: EM | Admit: 2020-12-27 | Discharge: 2020-12-29 | DRG: 287 | Disposition: A | Discharge status: Home / Self Care | Payer: PPO | Attending: Family Medicine | Admitting: Family Medicine

## 2020-12-27 ENCOUNTER — Other Ambulatory Visit: Payer: Self-pay

## 2020-12-27 DIAGNOSIS — K219 Gastro-esophageal reflux disease without esophagitis: Secondary | ICD-10-CM | POA: Diagnosis not present

## 2020-12-27 DIAGNOSIS — Z955 Presence of coronary angioplasty implant and graft: Secondary | ICD-10-CM | POA: Diagnosis not present

## 2020-12-27 DIAGNOSIS — J449 Chronic obstructive pulmonary disease, unspecified: Secondary | ICD-10-CM | POA: Diagnosis not present

## 2020-12-27 DIAGNOSIS — Z7982 Long term (current) use of aspirin: Secondary | ICD-10-CM

## 2020-12-27 DIAGNOSIS — F17210 Nicotine dependence, cigarettes, uncomplicated: Secondary | ICD-10-CM | POA: Diagnosis not present

## 2020-12-27 DIAGNOSIS — I723 Aneurysm of iliac artery: Secondary | ICD-10-CM | POA: Diagnosis not present

## 2020-12-27 DIAGNOSIS — I739 Peripheral vascular disease, unspecified: Secondary | ICD-10-CM | POA: Diagnosis present

## 2020-12-27 DIAGNOSIS — Z79899 Other long term (current) drug therapy: Secondary | ICD-10-CM

## 2020-12-27 DIAGNOSIS — E785 Hyperlipidemia, unspecified: Secondary | ICD-10-CM | POA: Diagnosis present

## 2020-12-27 DIAGNOSIS — R079 Chest pain, unspecified: Secondary | ICD-10-CM

## 2020-12-27 DIAGNOSIS — I25110 Atherosclerotic heart disease of native coronary artery with unstable angina pectoris: Principal | ICD-10-CM | POA: Diagnosis present

## 2020-12-27 DIAGNOSIS — K5730 Diverticulosis of large intestine without perforation or abscess without bleeding: Secondary | ICD-10-CM | POA: Diagnosis not present

## 2020-12-27 DIAGNOSIS — I200 Unstable angina: Secondary | ICD-10-CM | POA: Diagnosis present

## 2020-12-27 DIAGNOSIS — M109 Gout, unspecified: Secondary | ICD-10-CM | POA: Diagnosis present

## 2020-12-27 DIAGNOSIS — Z20822 Contact with and (suspected) exposure to covid-19: Secondary | ICD-10-CM | POA: Diagnosis present

## 2020-12-27 DIAGNOSIS — F17200 Nicotine dependence, unspecified, uncomplicated: Secondary | ICD-10-CM | POA: Diagnosis not present

## 2020-12-27 DIAGNOSIS — Z8249 Family history of ischemic heart disease and other diseases of the circulatory system: Secondary | ICD-10-CM

## 2020-12-27 DIAGNOSIS — I2510 Atherosclerotic heart disease of native coronary artery without angina pectoris: Secondary | ICD-10-CM | POA: Diagnosis not present

## 2020-12-27 DIAGNOSIS — E7800 Pure hypercholesterolemia, unspecified: Secondary | ICD-10-CM | POA: Diagnosis present

## 2020-12-27 DIAGNOSIS — I10 Essential (primary) hypertension: Secondary | ICD-10-CM | POA: Diagnosis not present

## 2020-12-27 DIAGNOSIS — R0789 Other chest pain: Secondary | ICD-10-CM | POA: Diagnosis not present

## 2020-12-27 DIAGNOSIS — I712 Thoracic aortic aneurysm, without rupture: Secondary | ICD-10-CM | POA: Diagnosis not present

## 2020-12-27 DIAGNOSIS — Z7902 Long term (current) use of antithrombotics/antiplatelets: Secondary | ICD-10-CM

## 2020-12-27 DIAGNOSIS — K4020 Bilateral inguinal hernia, without obstruction or gangrene, not specified as recurrent: Secondary | ICD-10-CM | POA: Diagnosis not present

## 2020-12-27 DIAGNOSIS — J984 Other disorders of lung: Secondary | ICD-10-CM | POA: Diagnosis not present

## 2020-12-27 LAB — RESP PANEL BY RT-PCR (FLU A&B, COVID) ARPGX2
Influenza A by PCR: NEGATIVE
Influenza B by PCR: NEGATIVE
SARS Coronavirus 2 by RT PCR: NEGATIVE

## 2020-12-27 LAB — CBC
HCT: 45.8 % (ref 39.0–52.0)
Hemoglobin: 15.8 g/dL (ref 13.0–17.0)
MCH: 33.4 pg (ref 26.0–34.0)
MCHC: 34.5 g/dL (ref 30.0–36.0)
MCV: 96.8 fL (ref 80.0–100.0)
Platelets: 200 10*3/uL (ref 150–400)
RBC: 4.73 MIL/uL (ref 4.22–5.81)
RDW: 13.3 % (ref 11.5–15.5)
WBC: 6.5 10*3/uL (ref 4.0–10.5)
nRBC: 0 % (ref 0.0–0.2)

## 2020-12-27 LAB — BASIC METABOLIC PANEL
Anion gap: 7 (ref 5–15)
BUN: 23 mg/dL (ref 8–23)
CO2: 28 mmol/L (ref 22–32)
Calcium: 9.5 mg/dL (ref 8.9–10.3)
Chloride: 100 mmol/L (ref 98–111)
Creatinine, Ser: 1.01 mg/dL (ref 0.61–1.24)
GFR, Estimated: >60 mL/min (ref >60)
Glucose, Bld: 120 mg/dL — ABNORMAL HIGH (ref 70–99)
Potassium: 3.8 mmol/L (ref 3.5–5.1)
Sodium: 135 mmol/L (ref 135–145)

## 2020-12-27 LAB — TROPONIN I (HIGH SENSITIVITY)
Troponin I (High Sensitivity): 4 ng/L (ref <18)
Troponin I (High Sensitivity): 4 ng/L (ref <18)

## 2020-12-27 MED ORDER — DIPHENHYDRAMINE HCL 25 MG PO CAPS: 50.0000 mg | ORAL_CAPSULE | Freq: Once | ORAL | Status: AC

## 2020-12-27 MED ORDER — HEPARIN (PORCINE) 25000 UT/250ML-% IV SOLN
1000.0000 [IU]/h | INTRAVENOUS | Status: DC
  Administered 2020-12-28 (×2): 1200 [IU]/h via INTRAVENOUS
  Filled 2020-12-27 (×2): qty 250

## 2020-12-27 MED ORDER — ONDANSETRON HCL 4 MG/2ML IJ SOLN
4.0000 mg | Freq: Once | INTRAMUSCULAR | Status: AC
  Administered 2020-12-27: 4 mg via INTRAVENOUS
  Filled 2020-12-27: qty 2

## 2020-12-27 MED ORDER — DIPHENHYDRAMINE HCL 50 MG/ML IJ SOLN
50.0000 mg | Freq: Once | INTRAMUSCULAR | Status: AC
  Administered 2020-12-27: 50 mg via INTRAVENOUS
  Filled 2020-12-27: qty 1

## 2020-12-27 MED ORDER — MORPHINE SULFATE (PF) 4 MG/ML IV SOLN
4.0000 mg | INTRAVENOUS | Status: DC | PRN
  Filled 2020-12-27: qty 1

## 2020-12-27 MED ORDER — HEPARIN BOLUS VIA INFUSION
4000.0000 [IU] | Freq: Once | INTRAVENOUS | Status: AC
  Administered 2020-12-28: 4000 [IU] via INTRAVENOUS
  Filled 2020-12-27: qty 4000

## 2020-12-27 MED ORDER — MORPHINE SULFATE (PF) 2 MG/ML IV SOLN
2.0000 mg | Freq: Once | INTRAVENOUS | Status: AC
  Administered 2020-12-27: 2 mg via INTRAVENOUS
  Filled 2020-12-27: qty 1

## 2020-12-27 MED ORDER — HYDROCORTISONE NA SUCCINATE PF 250 MG IJ SOLR
200.0000 mg | Freq: Once | INTRAMUSCULAR | Status: AC
  Administered 2020-12-27: 200 mg via INTRAVENOUS
  Filled 2020-12-27: qty 200

## 2020-12-27 MED ORDER — IOHEXOL 350 MG/ML SOLN
100.0000 mL | Freq: Once | INTRAVENOUS | Status: AC | PRN
  Administered 2020-12-27: 100 mL via INTRAVENOUS
  Filled 2020-12-27: qty 100

## 2020-12-27 NOTE — ED Notes (Signed)
Patient transported to CT 

## 2020-12-27 NOTE — ED Provider Notes (Signed)
[facility] Emergency Department Provider Note    Event Date/Time   First MD Initiated Contact with Patient 12/27/20 1740     (approximate)  I have reviewed the triage vital signs and the nursing notes.   HISTORY  Chief Complaint Chest Pain    HPI Aaron [name] is a 65 y.o. male below listed past medical history presents to the ER for evaluation of intermittent left-sided chest pain shooting through to his shoulder blades for the past 3 days.  Is having some tingling to the left hand as well.  Feels like he does have some shortness of breath but its not significantly worse than his baseline.  No cough or congestion.  No fevers.  No diaphoresis.  No pain with position changes.  Denies any trauma.  Past Medical History:  Diagnosis Date   COPD (chronic obstructive pulmonary disease) (HCC)    Coronary artery disease    s/p PCI   Fracture of left ankle, closed, initial encounter 10/23/2017   GERD (gastroesophageal reflux disease)    Glaucoma    Gout    Hx of heart artery stent 01/12/2017   Hypercholesteremia    Hypertension    PAD (peripheral artery disease) (HCC)    Pain in limb 07/13/2016   Syncope 09/16/2017   Unstable angina ([place]) 01/12/2017   Vertigo    Family History  Problem Relation Age of Onset   Hypertension Mother    Diabetes Mother    Heart failure Father        age 65   Cancer Father    Ovarian cancer Sister    Heart disease Brother    Past Surgical History:  Procedure Laterality Date   APPENDECTOMY     cardiac stents   2012   COLONOSCOPY  03/30/2013   [place]   COLONOSCOPY WITH PROPOFOL N/A 12/10/2019   Procedure: COLONOSCOPY WITH PROPOFOL;  Surgeon: [name], MD;  Location: [place];  Service: Endoscopy;  Laterality: N/A;   COLONOSCOPY WITH PROPOFOL N/A 01/18/2020   Procedure: COLONOSCOPY WITH PROPOFOL;  Surgeon: [name], MD;  Location: [place];  Service: Endoscopy;  Laterality: N/A;  4   LOWER  EXTREMITY ANGIOGRAPHY Right 09/25/2018   Procedure: LOWER EXTREMITY ANGIOGRAPHY;  Surgeon: Algernon Huxley, MD;  Location: [place] [facility];  Service: Cardiovascular;  Laterality: Right;   LOWER EXTREMITY ANGIOGRAPHY Left 03/17/2020   Procedure: LOWER EXTREMITY ANGIOGRAPHY;  Surgeon: Algernon Huxley, MD;  Location: [place] [facility];  Service: Cardiovascular;  Laterality: Left;   POLYPECTOMY N/A 01/18/2020   Procedure: POLYPECTOMY;  Surgeon: [name], MD;  Location: [place];  Service: Endoscopy;  Laterality: N/A;   Patient Active Problem List   Diagnosis Date Noted   Aortic atherosclerosis ([place]) 07/10/2020   Stage 2 moderate COPD by GOLD classification ([place]) 05/16/2020   Dyspepsia and disorder of function of stomach 05/16/2020   Cervical disc disorder of mid-cervical region 05/10/2020   Polyp of descending colon    History of colonic polyps    Alcohol dependence ([place]) 07/21/2019   Mixed hyperlipidemia 10/28/2018   Tobacco use disorder 07/13/2016   Prediabetes 07/27/2015   Controlled gout 12/22/2014   Coronary artery disease involving native coronary artery of native heart with angina pectoris ([place]) 10/09/2014   Essential (primary) hypertension 10/09/2014   Dupuytren's disease of palm 10/09/2014   Psoriasis 10/09/2014   Pain in shoulder 10/09/2014   H/O cardiac catheterization 01/27/2014   Peripheral vascular disease ([place])  01/27/2014      Prior to Admission medications   Medication Sig Start Date End Date Taking? Authorizing Provider  albuterol (VENTOLIN HFA) 108 (90 Base) MCG/ACT inhaler Inhale 2 puffs into the lungs every 6 (six) hours as needed for wheezing or shortness of breath. 02/20/19   Glean Hess, MD  aspirin EC 81 MG tablet Take 1 tablet (81 mg total) by mouth daily. 03/17/20   Algernon Huxley, MD  cilostazol (PLETAL) 50 MG tablet Take by mouth 2 (two) times daily.  07/15/19   [provider]  clopidogrel (PLAVIX) 75 MG tablet Take 1 tablet by mouth  daily.    [provider]  indomethacin (INDOCIN) 25 MG capsule Take 1 capsule (25 mg total) by mouth 2 (two) times daily as needed. 09/07/20   Glean Hess, MD  isosorbide mononitrate (IMDUR) 120 MG 24 hr tablet Take by mouth. 11/17/19   [provider]  lisinopril (PRINIVIL,ZESTRIL) 10 MG tablet Take 1 tablet by mouth daily.    [provider]  methocarbamol (ROBAXIN) 500 MG tablet Take 1 tablet (500 mg total) by mouth at bedtime. 07/12/20   Glean Hess, MD  metoprolol tartrate (LOPRESSOR) 50 MG tablet Take 50-75 mg by mouth 2 (two) times a day. Take 50MG  by mouth every morning and 75MG  every evening    [provider]  nitroGLYCERIN (NITROSTAT) 0.4 MG SL tablet Place 1 tablet under the tongue as needed.    [provider]  pantoprazole (PROTONIX) 40 MG tablet Take 1 tablet (40 mg total) by mouth daily. 07/21/19   Glean Hess, MD  simvastatin (ZOCOR) 40 MG tablet Take 1 tablet (40 mg total) by mouth at bedtime. 05/21/15   Glean Hess, MD    Allergies Contrast media [iodinated diagnostic agents] and Iodine    Social History Social History   Tobacco Use   Smoking status: Every Day    Packs/day: 1.00    Years: 46.00    Pack years: 46.00    Types: Cigarettes   Smokeless tobacco: Never   Tobacco comments:    chantix   Vaping Use   Vaping Use: Never used  Substance Use Topics   Alcohol use: Yes    Comment: quit drinking around 04/2019(May have drink 1x/mo)   Drug use: No    Review of Systems Patient denies headaches, rhinorrhea, blurry vision, numbness, shortness of breath, chest pain, edema, cough, abdominal pain, nausea, vomiting, diarrhea, dysuria, fevers, rashes or hallucinations unless otherwise stated above in HPI. ____________________________________________   PHYSICAL EXAM:  VITAL SIGNS: Vitals:   12/27/20 1705  BP: (!) 146/77  Pulse: 66  Resp: 16  Temp: 98 F (36.7 C)  SpO2: 98%    Constitutional:  Alert and oriented.  Eyes: Conjunctivae are normal.  Head: Atraumatic. Nose: No congestion/rhinnorhea. Mouth/Throat: Mucous membranes are moist.   Neck: No stridor. Painless ROM.  Cardiovascular: Normal rate, regular rhythm. Grossly normal heart sounds.  Good peripheral circulation. Respiratory: Normal respiratory effort.  No retractions. Lungs CTAB. Gastrointestinal: Soft and nontender. No distention. No abdominal bruits. No CVA tenderness. Genitourinary:  Musculoskeletal: No lower extremity tenderness nor edema.  No joint effusions. Neurologic:  Normal speech and language. No gross focal neurologic deficits are appreciated. No facial droop Skin:  Skin is warm, dry and intact. No rash noted. Psychiatric: Mood and affect are normal. Speech and behavior are normal.  ____________________________________________   LABS (all labs ordered are listed, but only abnormal results are displayed)  Results for orders placed or performed during the hospital encounter of 12/27/20 (from the past 24 hour(s))  Basic metabolic panel     Status: Abnormal   Collection Time: 12/27/20  5:05 PM  Result Value Ref Range   Sodium 135 135 - 145 mmol/L   Potassium 3.8 3.5 - 5.1 mmol/L   Chloride 100 98 - 111 mmol/L   CO2 28 22 - 32 mmol/L   Glucose, Bld 120 (H) 70 - 99 mg/dL   BUN 23 8 - 23 mg/dL   Creatinine, Ser 1.01 0.61 - 1.24 mg/dL   Calcium 9.5 8.9 - 10.3 mg/dL   GFR, Estimated >60 >60 mL/min   Anion gap 7 5 - 15  CBC     Status: None   Collection Time: 12/27/20  5:05 PM  Result Value Ref Range   WBC 6.5 4.0 - 10.5 K/uL   RBC 4.73 4.22 - 5.81 MIL/uL   Hemoglobin 15.8 13.0 - 17.0 g/dL   HCT 45.8 39.0 - 52.0 %   MCV 96.8 80.0 - 100.0 fL   MCH 33.4 26.0 - 34.0 pg   MCHC 34.5 30.0 - 36.0 g/dL   RDW 13.3 11.5 - 15.5 %   Platelets 200 150 - 400 K/uL   nRBC 0.0 0.0 - 0.2 %  Troponin I (High Sensitivity)     Status: None   Collection Time: 12/27/20  5:05 PM  Result Value Ref Range   Troponin I  (High Sensitivity) 4 <18 ng/L   ____________________________________________  EKG My review and personal interpretation at Time: 17:00   Indication: chest pain  Rate: 65  Rhythm: sinus Axis: normal Other: normal intervals, nonspecific st abn, tracing is consistent with previous tracings ____________________________________________  RADIOLOGY  I personally reviewed all radiographic images ordered to evaluate for the above acute complaints and reviewed radiology reports and findings.  These findings were personally discussed with the patient.  Please see medical record for radiology report.  ____________________________________________   PROCEDURES  Procedure(s) performed:  Procedures    Critical Care performed: no ____________________________________________   INITIAL IMPRESSION / ASSESSMENT AND PLAN / ED COURSE  Pertinent labs & imaging results that were available during my care of the patient were reviewed by me and considered in my medical decision making (see chart for details).   DDX: ACS, pericarditis, esophagitis, boerhaaves, pe, dissection, pna, bronchitis, costochondritis   Aaron [name] is a 65 y.o. who presents to the ED with presentation as described above.  Patient clinically well-appearing but has multiple risk factors and known coronary disease.  EKG is actually improved as compared to previous his initial troponin is negative.  Have a lower suspicion for PE.  No significant wheezing on exam no sign of pneumonia or pneumothorax.  Given his pain radiating through to his back hypertension with history of atherosclerotic disease will order CTA to evaluate for any evidence of dissection.  Will give pain medication and reassess.  Clinical Course as of 12/27/20 1932  Tue Dec 27, 2020  1910 Patient feels significantly improved.  After further review of his past medical record it seems like his had similar presentation with left-sided chest pain numbness and tingling  of left arm.  Have a lower suspicion for ACS but do feel that he will require serial enzymes given his history of coronary disease.  Do feel that CTA clinically indicated.  Will consult cardiology for further recommendations. [PR]  1930 Discussed case in consultation with Dr. Nehemiah Massed.  If serial enzymes are  negative and CTA negative patient's pain remains improved will be appropriate for close outpatient follow-up in clinic.  If his delta troponin increases or if he develops any worsening pain plan will be discussed with hospitalist for admission for further evaluation and management by cardiology.  Patient be signed out to oncoming physician pending follow-up CTA and serial enzymes. [PR]    Clinical Course User Index [PR] Merlyn Lot, MD    The patient was evaluated in Emergency Department today for the symptoms described in the history of present illness. He/she was evaluated in the context of the global COVID-19 pandemic, which necessitated consideration that the patient might be at risk for infection with the SARS-CoV-2 virus that causes COVID-19. Institutional protocols and algorithms that pertain to the evaluation of patients at risk for COVID-19 are in a state of rapid change based on information released by regulatory bodies including the CDC and federal and state organizations. These policies and algorithms were followed during the patient's care in the ED.  As part of my medical decision making, I reviewed the following data within the [facility] notes reviewed and incorporated, Labs reviewed, notes from prior ED visits and [place] Controlled Substance Database   ____________________________________________   FINAL CLINICAL IMPRESSION(S) / ED DIAGNOSES  Final diagnoses:  Chest pain, unspecified type      NEW MEDICATIONS STARTED DURING THIS VISIT:  New Prescriptions   No medications on file     Note:  This document was prepared using Dragon voice  recognition software and may include unintentional dictation errors.    Merlyn Lot, MD 12/27/20 [phone]

## 2020-12-27 NOTE — ED Triage Notes (Signed)
Pt to ED via [facility] from home for chest pain that started intermittently on Saturday and became consistent today. Pt reports pain radiates into his back and shoulders. Pt reports numbness in left arm x 3 days. Pt states that he has also had shortness of breath and dizziness as well. Pt is in NAD.

## 2020-12-27 NOTE — ED Notes (Signed)
Admitting provider at bedside.

## 2020-12-27 NOTE — ED Provider Notes (Signed)
  Physical Exam  BP (!) 174/95   Pulse 71   Temp 98 F (36.7 C) (Oral)   Resp 16   Ht 6' (1.829 m)   Wt 90.7 kg   SpO2 96%   BMI 27.12 kg/m   Physical Exam  ED Course/Procedures   Clinical Course as of 12/27/20 2312  Tue Dec 27, 2020  1910 Patient feels significantly improved.  After further review of his past medical record it seems like his had similar presentation with left-sided chest pain numbness and tingling of left arm.  Have a lower suspicion for ACS but do feel that he will require serial enzymes given his history of coronary disease.  Do feel that CTA clinically indicated.  Will consult cardiology for further recommendations. [PR]  1930 Discussed case in consultation with Dr. Nehemiah Massed.  If serial enzymes are negative and CTA negative patient's pain remains improved will be appropriate for close outpatient follow-up in clinic.  If his delta troponin increases or if he develops any worsening pain plan will be discussed with hospitalist for admission for further evaluation and management by cardiology.  Patient be signed out to oncoming physician pending follow-up CTA and serial enzymes. [PR]    Clinical Course User Index [PR] Merlyn Lot, MD    Procedures  MDM   Assumed patient care from Merlyn Lot, MD.  CT angio chest noncontributory for dissection.  Second troponin returned within reference range.  Upon recheck, patient complained of persistent left-sided chest pain despite second dose of morphine given in the emergency department.  Patient would feel more comfortable being admitted.  Heparin was ordered per recommendations from Dr. Nehemiah Massed and Dr. Quentin Cornwall.  Patient was accepted for admission for further care and management.       Vallarie Mare [place], PA-C 12/27/20 2317    Merlyn Lot, MD 12/28/20 1037

## 2020-12-28 ENCOUNTER — Encounter: Payer: Self-pay | Admitting: Family Medicine

## 2020-12-28 DIAGNOSIS — I200 Unstable angina: Secondary | ICD-10-CM

## 2020-12-28 DIAGNOSIS — J449 Chronic obstructive pulmonary disease, unspecified: Secondary | ICD-10-CM

## 2020-12-28 DIAGNOSIS — I10 Essential (primary) hypertension: Secondary | ICD-10-CM

## 2020-12-28 LAB — BASIC METABOLIC PANEL
Anion gap: 6 (ref 5–15)
BUN: 21 mg/dL (ref 8–23)
CO2: 28 mmol/L (ref 22–32)
Calcium: 9.3 mg/dL (ref 8.9–10.3)
Chloride: 101 mmol/L (ref 98–111)
Creatinine, Ser: 0.95 mg/dL (ref 0.61–1.24)
GFR, Estimated: >60 mL/min (ref >60)
Glucose, Bld: 157 mg/dL — ABNORMAL HIGH (ref 70–99)
Potassium: 4.5 mmol/L (ref 3.5–5.1)
Sodium: 135 mmol/L (ref 135–145)

## 2020-12-28 LAB — HEPARIN LEVEL (UNFRACTIONATED)
Heparin Unfractionated: 0.47 IU/mL (ref 0.30–0.70)
Heparin Unfractionated: 0.44 IU/mL (ref 0.30–0.70)

## 2020-12-28 LAB — APTT: aPTT: 29 seconds (ref 24–36)

## 2020-12-28 LAB — PROTIME-INR
INR: 1.1 (ref 0.8–1.2)
Prothrombin Time: 13.8 seconds (ref 11.4–15.2)

## 2020-12-28 MED ORDER — METOPROLOL SUCCINATE ER 50 MG PO TB24: 75.0000 mg | ORAL_TABLET | Freq: Every day | ORAL | Status: DC

## 2020-12-28 MED ORDER — SIMVASTATIN 20 MG PO TABS
40.0000 mg | ORAL_TABLET | Freq: Every day | ORAL | Status: DC
  Administered 2020-12-28 (×2): 40 mg via ORAL
  Filled 2020-12-28: qty 4
  Filled 2020-12-28: qty 2

## 2020-12-28 MED ORDER — ASPIRIN 81 MG PO CHEW
81.0000 mg | CHEWABLE_TABLET | ORAL | Status: AC
  Administered 2020-12-29: 81 mg via ORAL
  Filled 2020-12-28 (×2): qty 1

## 2020-12-28 MED ORDER — PANTOPRAZOLE SODIUM 40 MG PO TBEC
40.0000 mg | DELAYED_RELEASE_TABLET | Freq: Every day | ORAL | Status: DC
  Administered 2020-12-28 – 2020-12-29 (×2): 40 mg via ORAL
  Filled 2020-12-28 (×2): qty 1

## 2020-12-28 MED ORDER — LISINOPRIL 10 MG PO TABS
10.0000 mg | ORAL_TABLET | Freq: Every day | ORAL | Status: DC
  Administered 2020-12-28 – 2020-12-29 (×2): 10 mg via ORAL
  Filled 2020-12-28 (×2): qty 1

## 2020-12-28 MED ORDER — CLOPIDOGREL BISULFATE 75 MG PO TABS
75.0000 mg | ORAL_TABLET | Freq: Every day | ORAL | Status: DC
  Administered 2020-12-28 – 2020-12-29 (×2): 75 mg via ORAL
  Filled 2020-12-28 (×2): qty 1

## 2020-12-28 MED ORDER — ALBUTEROL SULFATE HFA 108 (90 BASE) MCG/ACT IN AERS: 2.0000 | INHALATION_SPRAY | Freq: Four times a day (QID) | RESPIRATORY_TRACT | Status: DC | PRN

## 2020-12-28 MED ORDER — SODIUM CHLORIDE 0.9% FLUSH: 3.0000 mL | INTRAVENOUS | Status: DC | PRN

## 2020-12-28 MED ORDER — ALBUTEROL SULFATE (2.5 MG/3ML) 0.083% IN NEBU: 2.5000 mg | INHALATION_SOLUTION | Freq: Four times a day (QID) | RESPIRATORY_TRACT | Status: DC | PRN

## 2020-12-28 MED ORDER — SODIUM CHLORIDE 0.9% FLUSH
3.0000 mL | Freq: Two times a day (BID) | INTRAVENOUS | Status: DC
  Administered 2020-12-29: 3 mL via INTRAVENOUS

## 2020-12-28 MED ORDER — NITROGLYCERIN 2 % TD OINT
0.5000 [in_us] | TOPICAL_OINTMENT | Freq: Once | TRANSDERMAL | Status: AC
  Administered 2020-12-28: 0.5 [in_us] via TOPICAL
  Filled 2020-12-28: qty 1

## 2020-12-28 MED ORDER — ISOSORBIDE MONONITRATE ER 60 MG PO TB24
120.0000 mg | ORAL_TABLET | Freq: Every day | ORAL | Status: DC
  Administered 2020-12-28 – 2020-12-29 (×2): 120 mg via ORAL
  Filled 2020-12-28 (×2): qty 2

## 2020-12-28 MED ORDER — ASPIRIN EC 81 MG PO TBEC
81.0000 mg | DELAYED_RELEASE_TABLET | Freq: Every day | ORAL | Status: DC
  Administered 2020-12-28: 81 mg via ORAL
  Filled 2020-12-28: qty 1

## 2020-12-28 MED ORDER — MORPHINE SULFATE (PF) 2 MG/ML IV SOLN
2.0000 mg | INTRAVENOUS | Status: DC | PRN
Start: 2020-12-28 — End: 2020-12-30
  Administered 2020-12-28: 2 mg via INTRAVENOUS
  Filled 2020-12-28 (×2): qty 1

## 2020-12-28 MED ORDER — CILOSTAZOL 100 MG PO TABS
50.0000 mg | ORAL_TABLET | Freq: Two times a day (BID) | ORAL | Status: DC
  Administered 2020-12-28 – 2020-12-29 (×3): 50 mg via ORAL
  Filled 2020-12-28 (×5): qty 0.5

## 2020-12-28 MED ORDER — METOPROLOL TARTRATE 50 MG PO TABS
50.0000 mg | ORAL_TABLET | Freq: Every morning | ORAL | Status: DC
  Administered 2020-12-28 – 2020-12-29 (×2): 50 mg via ORAL
  Filled 2020-12-28 (×2): qty 1

## 2020-12-28 MED ORDER — SODIUM CHLORIDE 0.9 % WEIGHT BASED INFUSION: 1.0000 mL/kg/h | INTRAVENOUS | Status: DC

## 2020-12-28 MED ORDER — SODIUM CHLORIDE 0.9 % WEIGHT BASED INFUSION
3.0000 mL/kg/h | INTRAVENOUS | Status: AC
  Administered 2020-12-29: 3 mL/kg/h via INTRAVENOUS

## 2020-12-28 MED ORDER — METOPROLOL TARTRATE 50 MG PO TABS
75.0000 mg | ORAL_TABLET | Freq: Every day | ORAL | Status: DC
  Filled 2020-12-28: qty 1

## 2020-12-28 MED ORDER — SODIUM CHLORIDE 0.9 % IV SOLN: 250.0000 mL | INTRAVENOUS | Status: DC | PRN

## 2020-12-28 NOTE — H&P (Addendum)
History and Physical    Aaron [name] URK:[id number] DOB: [birth year] DOA: 12/27/2020  PCP: Glean Hess, MD  Patient coming from: Home  I have personally briefly reviewed patient's old medical records in [place]  Chief Complaint: Chest pain  HPI: Aaron [name] is a 65 y.o. male with medical history significant for CAD s/p multiple stents, bilateral lower extremity stent, hypertension, PVD, COPD, hyperlipidemia, gout, current tobacco use who presents with concerns of chest pain.  He reports having chest pain in the past 3 days.  It initially started with walking his dog which he usually does 3 times a day.  He felt like pain persisted for the past 3 days with short-term relief after taking nitroglycerin.  Still felt pain even when he was sitting.  Today he became more concerned when he had stabbing chest pain that radiated to his back.  He states he was only doing dishes and some vacuuming today.  He also endorsed numbness of his left upper extremity for the past 3 days.  Has some increasing shortness of breath.  He continues to smoke about half a pack a day and states he has been cutting down.  Patient was hospitalized on 02/06/2019 with chest pain and shortness of breath.  He ruled out for MI with negative troponin and unremarkable EKG.  He underwent Lexiscan Myoview on 02/18/2019 with no evidence of scar or ischemia.  He was also seen again in the ED on 06/23/2019 with normal EKG and negative troponin.  Pain thought to be GI in origin.   He was seen at [facility] ER [facility] on 11/2019 and ruled out for MI with negative troponin x2 and EKG.  He then followed up with cardiology and had his isosorbide mononitrate dose increased and reportedly did well overall with that change.   He follows with Dr. Saralyn Pilar at [facility].   ED Course: He was hypertensive up to systolic of 831/51.  CBC and BMP unremarkable troponin flat at 4.  CTA abdomen chest/abdomen and pelvis ruled out  dissection and showed known severe arthrosclerotic disease. He continues to have 5 out of 10 chest pain despite IV morphine. Dr. Quentin Cornwall discussed with cardiology Dr. Nehemiah Massed recommended patient be admitted for admission and heparinization given his persistent chest pain.  Plans for potential left heart cath.  Review of Systems: Constitutional: No Weight Change, No Fever ENT/Mouth: No sore throat, No Rhinorrhea Eyes: No Eye Pain, No Vision Changes Cardiovascular: + Chest Pain, + SOB, no edema Respiratory: No Cough, No Sputum, No Wheezing, + Dyspnea  Gastrointestinal: +o Nausea, No Vomiting, No Diarrhea, No Constipation, No Pain Genitourinary: no Urinary Incontinence, No Urgency, No Flank Pain Musculoskeletal: No Arthralgias, No Myalgias Skin: No Skin Lesions, No Pruritus, Neuro: no Weakness, No Numbness Psych: No Anxiety/Panic, No Depression, no decrease appetite Heme/Lymph: No Bruising, No Bleeding  Past Medical History:  Diagnosis Date   COPD (chronic obstructive pulmonary disease) (HCC)    Coronary artery disease    s/p PCI   Fracture of left ankle, closed, initial encounter 10/23/2017   GERD (gastroesophageal reflux disease)    Glaucoma    Gout    Hx of heart artery stent 01/12/2017   Hypercholesteremia    Hypertension    PAD (peripheral artery disease) (HCC)    Pain in limb 07/13/2016   Syncope 09/16/2017   Unstable angina ([place]) 01/12/2017   Vertigo     Past Surgical History:  Procedure Laterality Date   APPENDECTOMY  cardiac stents   2012   COLONOSCOPY  03/30/2013   [place]   COLONOSCOPY WITH PROPOFOL N/A 12/10/2019   Procedure: COLONOSCOPY WITH PROPOFOL;  Surgeon: [name], MD;  Location: [place];  Service: Endoscopy;  Laterality: N/A;   COLONOSCOPY WITH PROPOFOL N/A 01/18/2020   Procedure: COLONOSCOPY WITH PROPOFOL;  Surgeon: [name], MD;  Location: [place];  Service: Endoscopy;  Laterality: N/A;  4   LOWER EXTREMITY ANGIOGRAPHY  Right 09/25/2018   Procedure: LOWER EXTREMITY ANGIOGRAPHY;  Surgeon: Algernon Huxley, MD;  Location: [place] [facility];  Service: Cardiovascular;  Laterality: Right;   LOWER EXTREMITY ANGIOGRAPHY Left 03/17/2020   Procedure: LOWER EXTREMITY ANGIOGRAPHY;  Surgeon: Algernon Huxley, MD;  Location: [place] [facility];  Service: Cardiovascular;  Laterality: Left;   POLYPECTOMY N/A 01/18/2020   Procedure: POLYPECTOMY;  Surgeon: [name], MD;  Location: [place];  Service: Endoscopy;  Laterality: N/A;     reports that he has been smoking cigarettes. He has a 46.00 pack-year smoking history. He has never used smokeless tobacco. He reports current alcohol use. He reports that he does not use drugs. Social History  Allergies  Allergen Reactions   Contrast Media [Iodinated Diagnostic Agents] Hives   Iodine Hives    Family History  Problem Relation Age of Onset   Hypertension Mother    Diabetes Mother    Heart failure Father        age 84   Cancer Father    Ovarian cancer Sister    Heart disease Brother      Prior to Admission medications   Medication Sig Start Date End Date Taking? Authorizing Provider  albuterol (VENTOLIN HFA) 108 (90 Base) MCG/ACT inhaler Inhale 2 puffs into the lungs every 6 (six) hours as needed for wheezing or shortness of breath. 02/20/19  Yes Glean Hess, MD  aspirin EC 81 MG tablet Take 1 tablet (81 mg total) by mouth daily. 03/17/20  Yes Dew, Erskine Squibb, MD  betamethasone valerate (VALISONE) 0.1 % cream Apply topically. 12/22/20  Yes [provider]  cilostazol (PLETAL) 50 MG tablet Take by mouth 2 (two) times daily.  07/15/19  Yes [provider]  clopidogrel (PLAVIX) 75 MG tablet Take 1 tablet by mouth daily.   Yes [provider]  indomethacin (INDOCIN) 25 MG capsule Take 1 capsule (25 mg total) by mouth 2 (two) times daily as needed. 09/07/20  Yes Glean Hess, MD  isosorbide mononitrate (IMDUR) 120 MG 24 hr tablet Take 120  mg by mouth daily. 11/17/19  Yes [provider]  lisinopril (PRINIVIL,ZESTRIL) 10 MG tablet Take 1 tablet by mouth daily.   Yes [provider]  metoprolol tartrate (LOPRESSOR) 50 MG tablet Take 50-75 mg by mouth 2 (two) times a day. Take 50MG  by mouth every morning and 75MG  every evening   Yes [provider]  nitroGLYCERIN (NITROSTAT) 0.4 MG SL tablet Place 1 tablet under the tongue as needed.   Yes [provider]  pantoprazole (PROTONIX) 40 MG tablet Take 1 tablet (40 mg total) by mouth daily. 07/21/19  Yes Glean Hess, MD  simvastatin (ZOCOR) 40 MG tablet Take 1 tablet (40 mg total) by mouth at bedtime. 05/21/15  Yes Glean Hess, MD  methocarbamol (ROBAXIN) 500 MG tablet Take 1 tablet (500 mg total) by mouth at bedtime. Patient not taking: Reported on 12/27/2020 07/12/20   Glean Hess, MD    Physical Exam: Vitals:  12/27/20 1702 12/27/20 1705 12/27/20 2215  BP:  (!) 146/77 (!) 174/95  Pulse:  66 71  Resp:  16 16  Temp:  98 F (36.7 C)   TempSrc:  Oral   SpO2:  98% 96%  Weight: 90.7 kg    Height: 6' (1.829 m)      Constitutional: NAD, calm, comfortable, elderly gentleman sitting upright at 40 degree incline in bed Vitals:   12/27/20 1702 12/27/20 1705 12/27/20 2215  BP:  (!) 146/77 (!) 174/95  Pulse:  66 71  Resp:  16 16  Temp:  98 F (36.7 C)   TempSrc:  Oral   SpO2:  98% 96%  Weight: 90.7 kg    Height: 6' (1.829 m)     Eyes: PERRL, lids and conjunctivae normal ENMT: Mucous membranes are moist.  Neck: normal, supple Respiratory: clear to auscultation bilaterally, no wheezing, no crackles. Normal respiratory effort. No accessory muscle use.  Cardiovascular: Regular rate and rhythm, no murmurs / rubs / gallops. No extremity edema. 2+ pedal pulses.  Abdomen: Midepigastric tenderness, no masses palpated.  Bowel sounds positive.  Musculoskeletal: no clubbing / cyanosis. No joint deformity upper and lower extremities. Good  ROM, no contractures. Normal muscle tone.  Skin: no rashes, lesions, ulcers. No induration Neurologic: CN 2-12 grossly intact. Sensation intact, Strength 5/5 in all 4.  Psychiatric: Normal judgment and insight. Alert and oriented x 3. Normal mood.     Labs on Admission: I have personally reviewed following labs and imaging studies  CBC: Recent Labs  Lab 12/27/20 1705  WBC 6.5  HGB 15.8  HCT 45.8  MCV 96.8  PLT 161   Basic Metabolic Panel: Recent Labs  Lab 12/27/20 1705  NA 135  K 3.8  CL 100  CO2 28  GLUCOSE 120*  BUN 23  CREATININE 1.01  CALCIUM 9.5   GFR: Estimated Creatinine Clearance: 80 mL/min (by C-G formula based on SCr of 1.01 mg/dL). Liver Function Tests: No results for input(s): AST, ALT, ALKPHOS, BILITOT, PROT, ALBUMIN in the last 168 hours. No results for input(s): LIPASE, AMYLASE in the last 168 hours. No results for input(s): AMMONIA in the last 168 hours. Coagulation Profile: No results for input(s): INR, PROTIME in the last 168 hours. Cardiac Enzymes: No results for input(s): CKTOTAL, CKMB, CKMBINDEX, TROPONINI in the last 168 hours. BNP (last 3 results) No results for input(s): PROBNP in the last 8760 hours. HbA1C: No results for input(s): HGBA1C in the last 72 hours. CBG: No results for input(s): GLUCAP in the last 168 hours. Lipid Profile: No results for input(s): CHOL, HDL, LDLCALC, TRIG, CHOLHDL, LDLDIRECT in the last 72 hours. Thyroid Function Tests: No results for input(s): TSH, T4TOTAL, FREET4, T3FREE, THYROIDAB in the last 72 hours. Anemia Panel: No results for input(s): VITAMINB12, FOLATE, FERRITIN, TIBC, IRON, RETICCTPCT in the last 72 hours. Urine analysis:    Component Value Date/Time   COLORURINE STRAW (A) 11/09/2019 1209   APPEARANCEUR CLEAR 11/09/2019 1209   APPEARANCEUR Clear 11/06/2012 1302   LABSPEC <1.005 (L) 11/09/2019 1209   LABSPEC 1.004 11/06/2012 1302   PHURINE 6.0 11/09/2019 1209   GLUCOSEU NEGATIVE 11/09/2019  1209   GLUCOSEU Negative 11/06/2012 1302   HGBUR TRACE (A) 11/09/2019 [place] 11/09/2019 1209   BILIRUBINUR neg 11/05/2019 1025   BILIRUBINUR Negative 11/06/2012 [place] 11/09/2019 1209   PROTEINUR NEGATIVE 11/09/2019 1209   UROBILINOGEN 0.2 11/05/2019 1025   NITRITE NEGATIVE 11/09/2019 1209   LEUKOCYTESUR  NEGATIVE 11/09/2019 1209   LEUKOCYTESUR Negative 11/06/2012 1302    Radiological Exams on Admission: DG Chest 2 View  Result Date: 12/27/2020 CLINICAL DATA:  Chest pain EXAM: CHEST - 2 VIEW COMPARISON:  11/09/2019 FINDINGS: The lungs are clear without focal pneumonia, edema, pneumothorax or pleural effusion. Nodular density/densities projecting over the lungs are compatible with pads for telemetry leads. The cardiopericardial silhouette is within normal limits for size. Hyperinflation suggests emphysema. Probable scarring right costophrenic angle, similar to prior. The visualized bony structures of the thorax show no acute abnormality. IMPRESSION: Hyperexpansion without acute cardiopulmonary findings. Electronically Signed   By: Misty Stanley M.D.   On: 12/27/2020 17:38   CT Angio Chest/Abd/Pel for Dissection W and/or Wo Contrast  Result Date: 12/27/2020 CLINICAL DATA:  Thoracic aortic aneurysm (TAA) suspected chest pain eval for dissection EXAM: CT ANGIOGRAPHY CHEST, ABDOMEN AND PELVIS TECHNIQUE: Non-contrast CT of the chest was initially obtained. Multidetector CT imaging through the chest, abdomen and pelvis was performed using the standard protocol during bolus administration of intravenous contrast. Multiplanar reconstructed images and MIPs were obtained and reviewed to evaluate the vascular anatomy. CONTRAST:  115mL OMNIPAQUE IOHEXOL 350 MG/ML SOLN COMPARISON:  CT angiography chest abdomen pelvis 11/09/2019 FINDINGS: CTA CHEST FINDINGS Cardiovascular: Preferential opacification of the thoracic aorta. No evidence of thoracic aortic aneurysm or  dissection. At least moderate calcified and noncalcified atherosclerotic plaque. At least 2 vessel coronary artery calcifications. Mild aortic valve leaflet calcifications. Normal heart size. No pericardial effusion. The pulmonary artery is normal in caliber. No central or segmental pulmonary embolus. Mediastinum/Nodes: No enlarged mediastinal, hilar, or axillary lymph nodes. Thyroid gland, trachea, and esophagus demonstrate no significant findings. Lungs/Pleura: Biapical pleural/pulmonary scarring. Few scattered pulmonary micronodules. No pulmonary mass. No focal consolidation. No pleural effusion. No pneumothorax. Musculoskeletal: No chest wall abnormality. No suspicious lytic or blastic osseous lesions. No acute displaced fracture. Multilevel degenerative changes of the spine. Review of the MIP images confirms the above findings. CTA ABDOMEN AND PELVIS FINDINGS VASCULAR Aorta: Severe calcified and noncalcified atherosclerotic plaque. Normal caliber aorta without aneurysm, dissection, vasculitis or significant stenosis. Celiac: At least moderate calcified and noncalcified atherosclerotic plaque. Patent without evidence of aneurysm, dissection, vasculitis or significant stenosis. SMA: At least moderate calcified and noncalcified atherosclerotic plaque. Patent without evidence of aneurysm, dissection, vasculitis or significant stenosis. Renals: At least moderate calcified and noncalcified atherosclerotic plaque. Both renal arteries are patent without evidence of aneurysm, dissection, vasculitis, fibromuscular dysplasia or significant stenosis. IMA: Patent without evidence of aneurysm, dissection, vasculitis or significant stenosis. Inflow: Severe calcified and noncalcified atherosclerotic plaque. Aneurysmal dilatation of the right common iliac artery measuring up to 1.8 cm status post patent stent graft. Patent without evidence of dissection, vasculitis or significant stenosis. Veins: No obvious venous abnormality  within the limitations of this arterial phase study. Review of the MIP images confirms the above findings. NON-VASCULAR Hepatobiliary: No focal liver abnormality. No gallstones, gallbladder wall thickening, or pericholecystic fluid. No biliary dilatation. Pancreas: No focal lesion. Normal pancreatic contour. No surrounding inflammatory changes. No main pancreatic ductal dilatation. Spleen: Normal in size without focal abnormality. Couple of splenule is are noted. Adrenals/Urinary Tract: No adrenal nodule bilaterally. Bilateral kidneys enhance symmetrically. No hydronephrosis. No hydroureter. The urinary bladder is unremarkable. Stomach/Bowel: Stomach is within normal limits. No evidence of bowel wall thickening or dilatation. No pneumatosis. Diffuse left colon and sigmoid diverticulosis. The appendix is not definitely identified and may be surgically absent. Lymphatic: No lymphadenopathy. Reproductive: The prostate is enlarged measuring up to 5  cm. Other: No intraperitoneal free fluid. No intraperitoneal free gas. No organized fluid collection. Musculoskeletal: Bilateral fat containing small inguinal hernias. No suspicious lytic or blastic osseous lesions. No acute displaced fracture. Multilevel degenerative changes of the spine. Review of the MIP images confirms the above findings. IMPRESSION: 1. No acute abnormality of the aorta. 2. Aneurysmal right common iliac artery status post stent graft placement. 3. No pulmonary embolus. 4. No acute intrathoracic, intra-abdominal, intrapelvic abnormality. 5. Other imaging findings of potential clinical significance: Diffuse left colon and sigmoid diverticulosis. Prostatomegaly. Bilateral small fat containing inguinal hernias. Electronically Signed   By: Iven Finn M.D.   On: 12/27/2020 22:41      Assessment/Plan  Unstable angina Hx of multiple stent, severe PVD, and chronic anginal symptoms -Troponin flat at 4 but given history and persistent chest pain,  cardiology recommends IV heparin infusion and will likely take for left heart catheterization tomorrow -Keep n.p.o. past midnight -Place Nitropaste -PRN morphine  -Continue Imdur, beta-blocker and statin  PVD -continue aspirin, Plavix and cilostazol  HTN -continue lisinopril, metoprolol  COPD -Not in acute exacerbation  Current Tobacco use -Patient is in the process of quitting.  Encouraged continued cessation  DVT prophylaxis:.IV heparin infusion  Code Status: Full Family Communication: Plan discussed with patient at bedside  disposition Plan: Home with at least 2 midnight stays  Consults called: Cardiology Admission status: inpatient  Level of care: Progressive Cardiac  Status is: Inpatient  Remains inpatient appropriate because:Inpatient level of care appropriate due to severity of illness  Dispo: The patient is from: Home              Anticipated d/c is to: Home              Patient currently is not medically stable to d/c.   Difficult to place patient No         Orene Desanctis DO [facility]   If 7PM-7AM, please contact night-coverage [email or web address]   12/28/2020, 12:05 AM

## 2020-12-28 NOTE — Progress Notes (Signed)
ANTICOAGULATION CONSULT NOTE - Initial Consult  Pharmacy Consult for Heparin Indication: chest pain/ACS  Allergies  Allergen Reactions   Contrast Media [Iodinated Diagnostic Agents] Hives   Iodine Hives    Patient Measurements: Height: 6' (182.9 cm) Weight: 90.7 kg (200 lb) IBW/kg (Calculated) : 77.6 Heparin Dosing Weight: 90.7 kg   Vital Signs: Temp: 98 F (36.7 C) (06/21 1705) Temp Source: Oral (06/21 1705) BP: 174/95 (06/21 2215) Pulse Rate: 71 (06/21 2215)  Labs: Recent Labs    12/27/20 1705 12/27/20 2215  HGB 15.8  --   HCT 45.8  --   PLT 200  --   CREATININE 1.01  --   TROPONINIHS 4 4    Estimated Creatinine Clearance: 80 mL/min (by C-G formula based on SCr of 1.01 mg/dL).   Medical History: Past Medical History:  Diagnosis Date   COPD (chronic obstructive pulmonary disease) ([place])    Coronary artery disease    s/p PCI   Fracture of left ankle, closed, initial encounter 10/23/2017   GERD (gastroesophageal reflux disease)    Glaucoma    Gout    Hx of heart artery stent 01/12/2017   Hypercholesteremia    Hypertension    PAD (peripheral artery disease) (HCC)    Pain in limb 07/13/2016   Syncope 09/16/2017   Unstable angina ([place]) 01/12/2017   Vertigo     Medications:  (Not in a hospital admission)   Assessment: Pharmacy consulted to dose heparin in this 65 year old male admitted with ACS/NSTEMI.  No prior anticoag noted.   CrCl = 80 ml/min  Goal of Therapy:  Heparin level 0.3-0.7 units/ml Monitor platelets by anticoagulation protocol: Yes   Plan:  Give 4000 units bolus x 1 Start heparin infusion at 1200 units/hr Check anti-Xa level in 6 hours and daily while on heparin Continue to monitor H&H and platelets  Aliceson Dolbow D 12/28/2020,1:20 AM

## 2020-12-28 NOTE — Progress Notes (Addendum)
ANTICOAGULATION CONSULT NOTE - Initial Consult  Pharmacy Consult for Heparin Indication: chest pain/ACS  Allergies  Allergen Reactions   Contrast Media [Iodinated Diagnostic Agents] Hives   Iodine Hives    Patient Measurements: Height: 6' (182.9 cm) Weight: 90.7 kg (200 lb) IBW/kg (Calculated) : 77.6 Heparin Dosing Weight: 90.7 kg   Vital Signs: Temp: 98.1 F (36.7 C) (06/22 1100) Temp Source: Oral (06/22 1100) BP: 127/79 (06/22 1200) Pulse Rate: 55 (06/22 1200)  Labs: Recent Labs    12/27/20 1705 12/27/20 2215 12/28/20 0050 12/28/20 0354 12/28/20 0826 12/28/20 1629  HGB 15.8  --   --   --   --   --   HCT 45.8  --   --   --   --   --   PLT 200  --   --   --   --   --   APTT  --   --  29  --   --   --   LABPROT  --   --  13.8  --   --   --   INR  --   --  1.1  --   --   --   HEPARINUNFRC  --   --   --   --  0.47 0.44  CREATININE 1.01  --   --  0.95  --   --   TROPONINIHS 4 4  --   --   --   --      Estimated Creatinine Clearance: 85.1 mL/min (by C-G formula based on SCr of 0.95 mg/dL).   Medical History: Past Medical History:  Diagnosis Date   COPD (chronic obstructive pulmonary disease) ([place])    Coronary artery disease    s/p PCI   Fracture of left ankle, closed, initial encounter 10/23/2017   GERD (gastroesophageal reflux disease)    Glaucoma    Gout    Hx of heart artery stent 01/12/2017   Hypercholesteremia    Hypertension    PAD (peripheral artery disease) (HCC)    Pain in limb 07/13/2016   Syncope 09/16/2017   Unstable angina (H[facility]) 01/12/2017   Vertigo     Medications:  DAPT with Plavix 75 mg daily and aspirin 81 mg daily No PTA anticoagulation  Assessment: 65 y.o. male with medical history significant for CAD s/p multiple stents, bilateral lower extremity stent, HTN, PVD, COPD, and HLD who presents with chest pain. Pharmacy has been consulted for heparin dosing for ACS/NSTEMI. Heparin Dosing Weight: 90.7 kg  Hgb and plts  WNL  Date Time HL Rate/comment 6/22 0826 0.47 1200 units/hr, therapeutic   Goal of Therapy:  Heparin level 0.3-0.7 units/ml Monitor platelets by anticoagulation protocol: Yes   Plan:  6/22 1629: HL 0.44, therapeutic x 2 Continue heparin infusion at 1200 units/hr Check HL with AM labs Continue to monitor H&H and platelets  Pearla Dubonnet, PharmD 12/28/2020,5:03 PM

## 2020-12-28 NOTE — Progress Notes (Addendum)
ANTICOAGULATION CONSULT NOTE - Initial Consult  Pharmacy Consult for Heparin Indication: chest pain/ACS  Allergies  Allergen Reactions   Contrast Media [Iodinated Diagnostic Agents] Hives   Iodine Hives    Patient Measurements: Height: 6' (182.9 cm) Weight: 90.7 kg (200 lb) IBW/kg (Calculated) : 77.6 Heparin Dosing Weight: 90.7 kg   Vital Signs: Temp: 98.7 F (37.1 C) (06/22 0700) Temp Source: Oral (06/22 0700) BP: 156/78 (06/22 0700) Pulse Rate: 63 (06/22 0700)  Labs: Recent Labs    12/27/20 1705 12/27/20 2215 12/28/20 0050 12/28/20 0354 12/28/20 0826  HGB 15.8  --   --   --   --   HCT 45.8  --   --   --   --   PLT 200  --   --   --   --   APTT  --   --  29  --   --   LABPROT  --   --  13.8  --   --   INR  --   --  1.1  --   --   HEPARINUNFRC  --   --   --   --  0.47  CREATININE 1.01  --   --  0.95  --   TROPONINIHS 4 4  --   --   --      Estimated Creatinine Clearance: 85.1 mL/min (by C-G formula based on SCr of 0.95 mg/dL).   Medical History: Past Medical History:  Diagnosis Date   COPD (chronic obstructive pulmonary disease) ([place])    Coronary artery disease    s/p PCI   Fracture of left ankle, closed, initial encounter 10/23/2017   GERD (gastroesophageal reflux disease)    Glaucoma    Gout    Hx of heart artery stent 01/12/2017   Hypercholesteremia    Hypertension    PAD (peripheral artery disease) (HCC)    Pain in limb 07/13/2016   Syncope 09/16/2017   Unstable angina ([facility]) 01/12/2017   Vertigo     Medications:  DAPT with Plavix 75 mg daily and aspirin 81 mg daily No PTA anticoagulation  Assessment: 65 y.o. male with medical history significant for CAD s/p multiple stents, bilateral lower extremity stent, HTN, PVD, COPD, and HLD who presents with chest pain. Pharmacy has been consulted for heparin dosing for ACS/NSTEMI. Heparin Dosing Weight: 90.7 kg  Hgb and plts WNL  Date Time HL Rate/comment 6/22 0826 0.47 1200 units/hr, therapeutic    Goal of Therapy:  Heparin level 0.3-0.7 units/ml Monitor platelets by anticoagulation protocol: Yes   Plan:  HL 0.47, therapeutic Continue heparin infusion at 1200 units/hr Check anti-Xa level in 6 hours and daily while on heparin Continue to monitor H&H and platelets  Darnelle Bos, PharmD 12/28/2020,9:18 AM

## 2020-12-28 NOTE — Plan of Care (Signed)
  Problem: Education: Goal: Knowledge of General Education information will improve Description: Including pain rating scale, medication(s)/side effects and non-pharmacologic comfort measures 12/28/2020 1841 by Cristela Blue, RN Outcome: Progressing 12/28/2020 1841 by Cristela Blue, RN Outcome: Progressing   Problem: Health Behavior/Discharge Planning: Goal: Ability to manage health-related needs will improve 12/28/2020 1841 by Cristela Blue, RN Outcome: Progressing 12/28/2020 1841 by Cristela Blue, RN Outcome: Progressing   Problem: Clinical Measurements: Goal: Ability to maintain clinical measurements within normal limits will improve 12/28/2020 1841 by Cristela Blue, RN Outcome: Progressing 12/28/2020 1841 by Cristela Blue, RN Outcome: Progressing Goal: Will remain free from infection 12/28/2020 1841 by Cristela Blue, RN Outcome: Progressing 12/28/2020 1841 by Cristela Blue, RN Outcome: Progressing Goal: Diagnostic test results will improve 12/28/2020 1841 by Cristela Blue, RN Outcome: Progressing 12/28/2020 1841 by Cristela Blue, RN Outcome: Progressing Goal: Respiratory complications will improve 12/28/2020 1841 by Cristela Blue, RN Outcome: Progressing 12/28/2020 1841 by Cristela Blue, RN Outcome: Progressing Goal: Cardiovascular complication will be avoided 12/28/2020 1841 by Cristela Blue, RN Outcome: Progressing 12/28/2020 1841 by Cristela Blue, RN Outcome: Progressing   Problem: Activity: Goal: Risk for activity intolerance will decrease 12/28/2020 1841 by Cristela Blue, RN Outcome: Progressing 12/28/2020 1841 by Cristela Blue, RN Outcome: Progressing   Problem: Nutrition: Goal: Adequate nutrition will be maintained 12/28/2020 1841 by Cristela Blue, RN Outcome: Progressing 12/28/2020 1841 by Cristela Blue, RN Outcome: Progressing   Problem: Coping: Goal: Level of anxiety will decrease 12/28/2020 1841 by Cristela Blue, RN Outcome:  Progressing 12/28/2020 1841 by Cristela Blue, RN Outcome: Progressing   Problem: Elimination: Goal: Will not experience complications related to bowel motility 12/28/2020 1841 by Cristela Blue, RN Outcome: Progressing 12/28/2020 1841 by Cristela Blue, RN Outcome: Progressing Goal: Will not experience complications related to urinary retention 12/28/2020 1841 by Cristela Blue, RN Outcome: Progressing 12/28/2020 1841 by Cristela Blue, RN Outcome: Progressing   Problem: Pain Managment: Goal: General experience of comfort will improve 12/28/2020 1841 by Cristela Blue, RN Outcome: Progressing 12/28/2020 1841 by Cristela Blue, RN Outcome: Progressing   Problem: Safety: Goal: Ability to remain free from injury will improve 12/28/2020 1841 by Cristela Blue, RN Outcome: Progressing 12/28/2020 1841 by Cristela Blue, RN Outcome: Progressing   Problem: Skin Integrity: Goal: Risk for impaired skin integrity will decrease 12/28/2020 1841 by Cristela Blue, RN Outcome: Progressing 12/28/2020 1841 by Cristela Blue, RN Outcome: Progressing

## 2020-12-28 NOTE — Progress Notes (Signed)
Interval events noted.  Chest pain is better and is graded as 3/10 in severity as compared to 10/10 on admission.  Vital signs are stable and physical exam is unremarkable.  He is on IV heparin infusion.  Continue current treatment.  Awaiting further recommendations from cardiologist.

## 2020-12-28 NOTE — Consult Note (Signed)
[facility] Consultation Note  Patient ID: Aaron [name], MRN: [id number], DOB/AGE: May 11, 1956 65 y.o. Admit date: 12/27/2020   Date of Consult: 12/28/2020 Primary Physician: Glean Hess, MD Primary Cardiologist: Paraschos  Chief Complaint:  Chief Complaint  Patient presents with   Chest Pain   Reason for Consult:  Chest pain  HPI: 65 y.o. male with known coronary artery disease status post multiple PCI and stent placement in the past the most recent 2 years ago.  The patient has been on appropriate medication management for this and done fairly well but also has significant peripheral vascular disease and claudication.  Recently he has had intermittent episodes of chest discomfort substernal in nature radiating into his left upper chest and left arm.  This is most consistent with his previous unstable angina for which he received stents.  The patient did arrive to the emergency room and has had some waxing and waning chest pain but now improved with appropriate medication.  The patient has an EKG showing normal sinus rhythm and normal EKG.  Troponin is 4.  There is no evidence of congestive heart failure by chest x-ray.  The patient is hemodynamically stable  Past Medical History:  Diagnosis Date   COPD (chronic obstructive pulmonary disease) ([place])    Coronary artery disease    s/p PCI   Fracture of left ankle, closed, initial encounter 10/23/2017   GERD (gastroesophageal reflux disease)    Glaucoma    Gout    Hx of heart artery stent 01/12/2017   Hypercholesteremia    Hypertension    PAD (peripheral artery disease) ([place])    Pain in limb 07/13/2016   Syncope 09/16/2017   Unstable angina ([place]) 01/12/2017   Vertigo       Surgical History:  Past Surgical History:  Procedure Laterality Date   APPENDECTOMY     cardiac stents   2012   COLONOSCOPY  03/30/2013   [place]   COLONOSCOPY WITH PROPOFOL N/A 12/10/2019   Procedure: COLONOSCOPY WITH PROPOFOL;  Surgeon:  [name], MD;  Location: [facility];  Service: Endoscopy;  Laterality: N/A;   COLONOSCOPY WITH PROPOFOL N/A 01/18/2020   Procedure: COLONOSCOPY WITH PROPOFOL;  Surgeon: [name], MD;  Location: [place] [facility];  Service: Endoscopy;  Laterality: N/A;  4   LOWER EXTREMITY ANGIOGRAPHY Right 09/25/2018   Procedure: LOWER EXTREMITY ANGIOGRAPHY;  Surgeon: Algernon Huxley, MD;  Location: [facility];  Service: Cardiovascular;  Laterality: Right;   LOWER EXTREMITY ANGIOGRAPHY Left 03/17/2020   Procedure: LOWER EXTREMITY ANGIOGRAPHY;  Surgeon: Algernon Huxley, MD;  Location: [facility];  Service: Cardiovascular;  Laterality: Left;   POLYPECTOMY N/A 01/18/2020   Procedure: POLYPECTOMY;  Surgeon: [name], MD;  Location: [facility];  Service: Endoscopy;  Laterality: N/A;     Home Meds: Prior to Admission medications   Medication Sig Start Date End Date Taking? Authorizing Provider  albuterol (VENTOLIN HFA) 108 (90 Base) MCG/ACT inhaler Inhale 2 puffs into the lungs every 6 (six) hours as needed for wheezing or shortness of breath. 02/20/19  Yes Glean Hess, MD  aspirin EC 81 MG tablet Take 1 tablet (81 mg total) by mouth daily. 03/17/20  Yes Dew, Erskine Squibb, MD  betamethasone valerate (VALISONE) 0.1 % cream Apply topically. 12/22/20  Yes [provider]  cilostazol (PLETAL) 50 MG tablet Take by mouth 2 (two) times daily.  07/15/19  Yes [provider]  clopidogrel (PLAVIX) 75 MG tablet  Take 1 tablet by mouth daily.   Yes [provider]  indomethacin (INDOCIN) 25 MG capsule Take 1 capsule (25 mg total) by mouth 2 (two) times daily as needed. 09/07/20  Yes Glean Hess, MD  isosorbide mononitrate (IMDUR) 120 MG 24 hr tablet Take 120 mg by mouth daily. 11/17/19  Yes [provider]  lisinopril (PRINIVIL,ZESTRIL) 10 MG tablet Take 1 tablet by mouth daily.   Yes [provider]  metoprolol tartrate (LOPRESSOR) 50 MG tablet  Take 50-75 mg by mouth 2 (two) times a day. Take 50MG  by mouth every morning and 75MG  every evening   Yes [provider]  nitroGLYCERIN (NITROSTAT) 0.4 MG SL tablet Place 1 tablet under the tongue as needed.   Yes [provider]  pantoprazole (PROTONIX) 40 MG tablet Take 1 tablet (40 mg total) by mouth daily. 07/21/19  Yes Glean Hess, MD  simvastatin (ZOCOR) 40 MG tablet Take 1 tablet (40 mg total) by mouth at bedtime. 05/21/15  Yes Glean Hess, MD  methocarbamol (ROBAXIN) 500 MG tablet Take 1 tablet (500 mg total) by mouth at bedtime. Patient not taking: Reported on 12/27/2020 07/12/20   Glean Hess, MD    Inpatient Medications:   aspirin EC  81 mg Oral Daily   cilostazol  50 mg Oral BID   clopidogrel  75 mg Oral Daily   isosorbide mononitrate  120 mg Oral Daily   lisinopril  10 mg Oral Daily   metoprolol tartrate  50 mg Oral q morning   metoprolol tartrate  75 mg Oral QHS   pantoprazole  40 mg Oral Daily   simvastatin  40 mg Oral QHS    heparin 1,200 Units/hr (12/28/20 1501)    Allergies:  Allergies  Allergen Reactions   Contrast Media [Iodinated Diagnostic Agents] Hives   Iodine Hives    Social History   Socioeconomic History   Marital status: Single    Spouse name: Not on file   Number of children: 0   Years of education: Not on file   Highest education level: 12th grade  Occupational History   Occupation: Retired/Disabled  Tobacco Use   Smoking status: Every Day    Packs/day: 1.00    Years: 46.00    Pack years: 46.00    Types: Cigarettes   Smokeless tobacco: Never   Tobacco comments:    chantix   Vaping Use   Vaping Use: Never used  Substance and Sexual Activity   Alcohol use: Yes    Comment: quit drinking around 04/2019(May have drink 1x/mo)   Drug use: No   Sexual activity: Not Currently    Birth control/protection: None  Other Topics Concern   Not on file  Social History Narrative   Independent at baseline. Lives  alone. Does not drive.    Social Determinants of Health   Financial Resource Strain: Low Risk    Difficulty of Paying Living Expenses: Not hard at all  Food Insecurity: No Food Insecurity   Worried About Charity fundraiser in the Last Year: Never true   [place] in the Last Year: Never true  Transportation Needs: No Transportation Needs   Lack of Transportation (Medical): No   Lack of Transportation (Non-Medical): No  Physical Activity: Sufficiently Active   Days of Exercise per Week: 7 days   Minutes of Exercise per Session: 40 min  Stress: No Stress Concern Present   Feeling of Stress : Not at all  Social Connections: Socially Isolated   Frequency of Communication with Friends and Family: More than three times a week   Frequency of Social Gatherings with Friends and Family: More than three times a week   Attends Religious Services: Never   Marine scientist or Organizations: No   Attends Music therapist: Never   Marital Status: Never married  Human resources officer Violence: Not At Risk   Fear of Current or Ex-Partner: No   Emotionally Abused: No   Physically Abused: No   Sexually Abused: No     Family History  Problem Relation Age of Onset   Hypertension Mother    Diabetes Mother    Heart failure Father        age 43   Cancer Father    Ovarian cancer Sister    Heart disease Brother      Review of Systems Positive for chest pain Negative for: General:  chills, fever, night sweats or weight changes.  Cardiovascular: PND orthopnea syncope dizziness  Dermatological skin lesions rashes Respiratory: Cough congestion Urologic: Frequent urination urination at night and hematuria Abdominal: negative for nausea, vomiting, diarrhea, bright red blood per rectum, melena, or hematemesis Neurologic: negative for visual changes, and/or hearing changes  All other systems reviewed and are otherwise negative except as noted above.  Labs: No results for  input(s): CKTOTAL, CKMB, TROPONINI in the last 72 hours. Lab Results  Component Value Date   WBC 6.5 12/27/2020   HGB 15.8 12/27/2020   HCT 45.8 12/27/2020   MCV 96.8 12/27/2020   PLT 200 12/27/2020    Recent Labs  Lab 12/28/20 0354  NA 135  K 4.5  CL 101  CO2 28  BUN 21  CREATININE 0.95  CALCIUM 9.3  GLUCOSE 157*   Lab Results  Component Value Date   CHOL 146 11/05/2019   HDL 38 (L) 11/05/2019   LDLCALC 82 11/05/2019   TRIG 149 11/05/2019   No results found for: DDIMER  Radiology/Studies:  DG Chest 2 View  Result Date: 12/27/2020 CLINICAL DATA:  Chest pain EXAM: CHEST - 2 VIEW COMPARISON:  11/09/2019 FINDINGS: The lungs are clear without focal pneumonia, edema, pneumothorax or pleural effusion. Nodular density/densities projecting over the lungs are compatible with pads for telemetry leads. The cardiopericardial silhouette is within normal limits for size. Hyperinflation suggests emphysema. Probable scarring right costophrenic angle, similar to prior. The visualized bony structures of the thorax show no acute abnormality. IMPRESSION: Hyperexpansion without acute cardiopulmonary findings. Electronically Signed   By: Misty Stanley M.D.   On: 12/27/2020 17:38   CT Angio Chest/Abd/Pel for Dissection W and/or Wo Contrast  Result Date: 12/27/2020 CLINICAL DATA:  Thoracic aortic aneurysm (TAA) suspected chest pain eval for dissection EXAM: CT ANGIOGRAPHY CHEST, ABDOMEN AND PELVIS TECHNIQUE: Non-contrast CT of the chest was initially obtained. Multidetector CT imaging through the chest, abdomen and pelvis was performed using the standard protocol during bolus administration of intravenous contrast. Multiplanar reconstructed images and MIPs were obtained and reviewed to evaluate the vascular anatomy. CONTRAST:  148mL OMNIPAQUE IOHEXOL 350 MG/ML SOLN COMPARISON:  CT angiography chest abdomen pelvis 11/09/2019 FINDINGS: CTA CHEST FINDINGS Cardiovascular: Preferential opacification of the  thoracic aorta. No evidence of thoracic aortic aneurysm or dissection. At least moderate calcified and noncalcified atherosclerotic plaque. At least 2 vessel coronary artery calcifications. Mild aortic valve leaflet calcifications. Normal heart size. No pericardial effusion. The pulmonary artery is normal in caliber. No central or segmental pulmonary embolus. Mediastinum/Nodes: No enlarged mediastinal,  hilar, or axillary lymph nodes. Thyroid gland, trachea, and esophagus demonstrate no significant findings. Lungs/Pleura: Biapical pleural/pulmonary scarring. Few scattered pulmonary micronodules. No pulmonary mass. No focal consolidation. No pleural effusion. No pneumothorax. Musculoskeletal: No chest wall abnormality. No suspicious lytic or blastic osseous lesions. No acute displaced fracture. Multilevel degenerative changes of the spine. Review of the MIP images confirms the above findings. CTA ABDOMEN AND PELVIS FINDINGS VASCULAR Aorta: Severe calcified and noncalcified atherosclerotic plaque. Normal caliber aorta without aneurysm, dissection, vasculitis or significant stenosis. Celiac: At least moderate calcified and noncalcified atherosclerotic plaque. Patent without evidence of aneurysm, dissection, vasculitis or significant stenosis. SMA: At least moderate calcified and noncalcified atherosclerotic plaque. Patent without evidence of aneurysm, dissection, vasculitis or significant stenosis. Renals: At least moderate calcified and noncalcified atherosclerotic plaque. Both renal arteries are patent without evidence of aneurysm, dissection, vasculitis, fibromuscular dysplasia or significant stenosis. IMA: Patent without evidence of aneurysm, dissection, vasculitis or significant stenosis. Inflow: Severe calcified and noncalcified atherosclerotic plaque. Aneurysmal dilatation of the right common iliac artery measuring up to 1.8 cm status post patent stent graft. Patent without evidence of dissection, vasculitis or  significant stenosis. Veins: No obvious venous abnormality within the limitations of this arterial phase study. Review of the MIP images confirms the above findings. NON-VASCULAR Hepatobiliary: No focal liver abnormality. No gallstones, gallbladder wall thickening, or pericholecystic fluid. No biliary dilatation. Pancreas: No focal lesion. Normal pancreatic contour. No surrounding inflammatory changes. No main pancreatic ductal dilatation. Spleen: Normal in size without focal abnormality. Couple of splenule is are noted. Adrenals/Urinary Tract: No adrenal nodule bilaterally. Bilateral kidneys enhance symmetrically. No hydronephrosis. No hydroureter. The urinary bladder is unremarkable. Stomach/Bowel: Stomach is within normal limits. No evidence of bowel wall thickening or dilatation. No pneumatosis. Diffuse left colon and sigmoid diverticulosis. The appendix is not definitely identified and may be surgically absent. Lymphatic: No lymphadenopathy. Reproductive: The prostate is enlarged measuring up to 5 cm. Other: No intraperitoneal free fluid. No intraperitoneal free gas. No organized fluid collection. Musculoskeletal: Bilateral fat containing small inguinal hernias. No suspicious lytic or blastic osseous lesions. No acute displaced fracture. Multilevel degenerative changes of the spine. Review of the MIP images confirms the above findings. IMPRESSION: 1. No acute abnormality of the aorta. 2. Aneurysmal right common iliac artery status post stent graft placement. 3. No pulmonary embolus. 4. No acute intrathoracic, intra-abdominal, intrapelvic abnormality. 5. Other imaging findings of potential clinical significance: Diffuse left colon and sigmoid diverticulosis. Prostatomegaly. Bilateral small fat containing inguinal hernias. Electronically Signed   By: Iven Finn M.D.   On: 12/27/2020 22:41    EKG: Normal sinus rhythm otherwise normal EKG  Weights: Filed Weights   12/27/20 1702  Weight: 90.7 kg      Physical Exam: Blood pressure 124/68, pulse 64, temperature (!) 97.5 F (36.4 C), temperature source Oral, resp. rate 19, height 6\' 1"  (1.854 m), weight 90.7 kg, SpO2 100 %. Body mass index is 26.39 kg/m. General: Well developed, well nourished, in no acute distress. Head eyes ears nose throat: Normocephalic, atraumatic, sclera non-icteric, no xanthomas, nares are without discharge. No apparent thyromegaly and/or mass  Lungs: Normal respiratory effort.  no wheezes, no rales, no rhonchi.  Heart: RRR with normal S1 S2. no murmur gallop, no rub, PMI is normal size and placement, carotid upstroke normal without bruit, jugular venous pressure is normal Abdomen: Soft, non-tender, non-distended with normoactive bowel sounds. No hepatomegaly. No rebound/guarding. No obvious abdominal masses. Abdominal aorta is normal size without bruit Extremities: No edema. no cyanosis,  no clubbing, no ulcers  Peripheral : 2+ bilateral upper extremity pulses, 2+ bilateral femoral pulses, 1+ bilateral dorsal pedal pulse Neuro: Alert and oriented. No facial asymmetry. No focal deficit. Moves all extremities spontaneously. Musculoskeletal: Normal muscle tone without kyphosis Psych:  Responds to questions appropriately with a normal affect.    Assessment: 65 year old male with known cardiovascular disease status post previous stents patient's PCI and stent placement with hypertension hyperlipidemia now with unstable angina but no current evidence of acute coronary syndrome and/or congestive heart failure  Plan: 1.  Continue heparin for further risk reduction and myocardial infarction and unstable angina 2.  Proceed to cardiac catheterization to assess coronary anatomy and further treatment thereof as necessary.  Patient understands risk and benefits of cardiac catheterization.  This includes the possibility of death stroke heart attack infection bleeding or blood clot.  He is at low risk for conscious sedation 3.   Continue other risk factor modification including hypertension and hyperlipidemia treatment  Signed, Corey Skains M.D. [facility] 12/28/2020, 6:30 PM

## 2020-12-29 ENCOUNTER — Encounter
Admission: EM | Disposition: A | Discharge status: Home / Self Care | Payer: Self-pay | Source: Home / Self Care | Attending: Family Medicine

## 2020-12-29 ENCOUNTER — Encounter: Payer: Self-pay | Admitting: Family Medicine

## 2020-12-29 DIAGNOSIS — F17200 Nicotine dependence, unspecified, uncomplicated: Secondary | ICD-10-CM

## 2020-12-29 DIAGNOSIS — I739 Peripheral vascular disease, unspecified: Secondary | ICD-10-CM

## 2020-12-29 HISTORY — PX: LEFT HEART CATH AND CORONARY ANGIOGRAPHY: CATH118249

## 2020-12-29 LAB — CBC
HCT: 47.9 % (ref 39.0–52.0)
Hemoglobin: 16.6 g/dL (ref 13.0–17.0)
MCH: 33.5 pg (ref 26.0–34.0)
MCHC: 34.7 g/dL (ref 30.0–36.0)
MCV: 96.8 fL (ref 80.0–100.0)
Platelets: 181 10*3/uL (ref 150–400)
RBC: 4.95 MIL/uL (ref 4.22–5.81)
RDW: 13.3 % (ref 11.5–15.5)
WBC: 6.6 10*3/uL (ref 4.0–10.5)
nRBC: 0 % (ref 0.0–0.2)

## 2020-12-29 LAB — HEPARIN LEVEL (UNFRACTIONATED): Heparin Unfractionated: 0.76 IU/mL — ABNORMAL HIGH (ref 0.30–0.70)

## 2020-12-29 SURGERY — LEFT HEART CATH AND CORONARY ANGIOGRAPHY
Anesthesia: Moderate Sedation

## 2020-12-29 MED ORDER — METHYLPREDNISOLONE SODIUM SUCC 125 MG IJ SOLR
INTRAMUSCULAR | Status: DC | PRN
  Administered 2020-12-29: 125 mg via INTRAVENOUS

## 2020-12-29 MED ORDER — MIDAZOLAM HCL 2 MG/2ML IJ SOLN
INTRAMUSCULAR | Status: AC
  Filled 2020-12-29: qty 2

## 2020-12-29 MED ORDER — HEPARIN (PORCINE) IN NACL 1000-0.9 UT/500ML-% IV SOLN
INTRAVENOUS | Status: AC
  Filled 2020-12-29: qty 1000

## 2020-12-29 MED ORDER — DIPHENHYDRAMINE HCL 50 MG/ML IJ SOLN
INTRAMUSCULAR | Status: DC | PRN
  Administered 2020-12-29: 50 mg via INTRAVENOUS

## 2020-12-29 MED ORDER — HEPARIN SODIUM (PORCINE) 1000 UNIT/ML IJ SOLN
INTRAMUSCULAR | Status: AC
  Filled 2020-12-29: qty 1

## 2020-12-29 MED ORDER — LIDOCAINE HCL (PF) 1 % IJ SOLN
INTRAMUSCULAR | Status: AC
  Filled 2020-12-29: qty 30

## 2020-12-29 MED ORDER — LIDOCAINE HCL (PF) 1 % IJ SOLN
INTRAMUSCULAR | Status: DC | PRN
  Administered 2020-12-29: 30 mL

## 2020-12-29 MED ORDER — LABETALOL HCL 5 MG/ML IV SOLN: 10.0000 mg | INTRAVENOUS | Status: DC | PRN

## 2020-12-29 MED ORDER — HEPARIN (PORCINE) IN NACL 2000-0.9 UNIT/L-% IV SOLN
INTRAVENOUS | Status: DC | PRN
  Administered 2020-12-29: 1000 mL

## 2020-12-29 MED ORDER — HEPARIN SODIUM (PORCINE) 1000 UNIT/ML IJ SOLN
INTRAMUSCULAR | Status: DC | PRN
  Administered 2020-12-29: 4000 [IU] via INTRAVENOUS

## 2020-12-29 MED ORDER — ACETAMINOPHEN 325 MG PO TABS: 650.0000 mg | ORAL_TABLET | ORAL | Status: DC | PRN

## 2020-12-29 MED ORDER — FENTANYL CITRATE (PF) 100 MCG/2ML IJ SOLN
INTRAMUSCULAR | Status: DC | PRN
  Administered 2020-12-29: 25 ug via INTRAVENOUS

## 2020-12-29 MED ORDER — METHYLPREDNISOLONE SODIUM SUCC 125 MG IJ SOLR
INTRAMUSCULAR | Status: AC
  Filled 2020-12-29: qty 2

## 2020-12-29 MED ORDER — POLYETHYLENE GLYCOL 3350 17 G PO PACK: 17.0000 g | PACK | Freq: Every day | ORAL | Status: DC | PRN

## 2020-12-29 MED ORDER — SODIUM CHLORIDE 0.9 % WEIGHT BASED INFUSION: 1.0000 mL/kg/h | INTRAVENOUS | Status: DC

## 2020-12-29 MED ORDER — DIPHENHYDRAMINE HCL 50 MG/ML IJ SOLN
INTRAMUSCULAR | Status: AC
  Filled 2020-12-29: qty 1

## 2020-12-29 MED ORDER — ASPIRIN EC 81 MG PO TBEC: 81.0000 mg | DELAYED_RELEASE_TABLET | Freq: Every day | ORAL | Status: DC

## 2020-12-29 MED ORDER — HYDRALAZINE HCL 20 MG/ML IJ SOLN
10.0000 mg | INTRAMUSCULAR | Status: DC | PRN
Start: 2020-12-29 — End: 2020-12-29

## 2020-12-29 MED ORDER — SENNA 8.6 MG PO TABS
1.0000 | ORAL_TABLET | Freq: Every day | ORAL | Status: DC
  Administered 2020-12-29: 8.6 mg via ORAL
  Filled 2020-12-29: qty 1

## 2020-12-29 MED ORDER — FENTANYL CITRATE (PF) 100 MCG/2ML IJ SOLN
INTRAMUSCULAR | Status: AC
  Filled 2020-12-29: qty 2

## 2020-12-29 MED ORDER — ONDANSETRON HCL 4 MG/2ML IJ SOLN: 4.0000 mg | Freq: Four times a day (QID) | INTRAMUSCULAR | Status: DC | PRN

## 2020-12-29 MED ORDER — VERAPAMIL HCL 2.5 MG/ML IV SOLN
INTRAVENOUS | Status: DC | PRN
  Administered 2020-12-29: 2.5 mg via INTRAVENOUS

## 2020-12-29 MED ORDER — IOHEXOL 300 MG/ML  SOLN
INTRAMUSCULAR | Status: DC | PRN
  Administered 2020-12-29: 65 mL

## 2020-12-29 MED ORDER — FAMOTIDINE 20 MG PO TABS
ORAL_TABLET | ORAL | Status: AC
  Filled 2020-12-29: qty 2

## 2020-12-29 MED ORDER — FAMOTIDINE 20 MG PO TABS
ORAL_TABLET | ORAL | Status: DC | PRN
  Administered 2020-12-29: 40 mg via ORAL

## 2020-12-29 MED ORDER — VERAPAMIL HCL 2.5 MG/ML IV SOLN
INTRAVENOUS | Status: AC
  Filled 2020-12-29: qty 2

## 2020-12-29 MED ORDER — MIDAZOLAM HCL 2 MG/2ML IJ SOLN
INTRAMUSCULAR | Status: DC | PRN
  Administered 2020-12-29: 1 mg via INTRAVENOUS

## 2020-12-29 SURGICAL SUPPLY — 11 items

## 2020-12-29 NOTE — Progress Notes (Signed)
Aaron [name] to be D/C'd Home per MD order.  Discussed prescriptions and follow up appointments with the patient. Pt verbalized understanding.Pt has been ambulating to bathroom and around nurses station, no complains of chest pain or changes on tele. Vascular discharge instructions given to pt and educated to watch for any signs of infection and keep arm elevated. Pt verbalized understanding.  IV catheter discontinued intact. Site without signs and symptoms of complications. Dressing and pressure applied. Pt denies pain at this time. No complaints noted.  An After Visit Summary was printed and given to the patient.  Pt noted  to have a dime size amount of blood on radial site, held pressure, no tingling or pain. Primary RN Zoia aware per primary nurse will notify MD.   Rolley Sims

## 2020-12-29 NOTE — Discharge Summary (Signed)
Physician Discharge Summary  Nike Southwell Nomura NOB:[id number] DOB: 1955-08-06 DOA: 12/27/2020  PCP: Glean Hess, MD  Admit date: 12/27/2020 Discharge date: 12/29/2020  Discharge disposition: Home   Recommendations for Outpatient Follow-Up:   Follow-up with PCP in 1 week. Follow-up with Dr. Saralyn Pilar, cardiologist, as scheduled   Discharge Diagnosis:   Principal Problem:   Unstable angina ([place]) Active Problems:   Essential (primary) hypertension   Peripheral vascular disease ([place])   Tobacco use disorder   Stage 2 moderate COPD by GOLD classification ([place])    Discharge Condition: Stable.  Diet recommendation:  Diet Order             Diet clear liquid Room service appropriate? Yes; Fluid consistency: Thin  Diet effective now           Diet - low sodium heart healthy                     Code Status: Full Code     Hospital Course:   Mr. Godson Pollan is a 65 year old man with medical history significant for CAD s/p multiple stents, bilateral lower extremity stent, hypertension, PVD, COPD, hyperlipidemia, gout, tobacco use disorder, who presented to the hospital because of chest pain that has been going on for about 3 days.  Chest pain was worse with exertion.  Troponins were negative and EKG was unremarkable.  He was admitted to the hospital for unstable angina.  He was treated with IV heparin infusion.  Cardiologist was consulted.  He underwent left heart cath and findings are as follows:   Mid RCA lesion is 50% stenosed. RPDA lesion is 65% stenosed. Mid RCA to Dist RCA lesion is 45% stenosed. Ost RCA to Prox RCA lesion is 60% stenosed. Ost Cx to Prox Cx lesion is 95% stenosed. Ramus lesion is 60% stenosed. Prox LAD lesion is 30% stenosed. Mid LM lesion is 30% stenosed.  He did not require any coronary stent.  Cardiologist recommended continued medical management.  From cardiologist's standpoint, patient is okay for discharge.  Patient is doing  well.  He has been able to ambulate without any symptoms.      Medical Consultants:   Cardiologist   Discharge Exam:    Vitals:   12/29/20 1345 12/29/20 1400 12/29/20 1430 12/29/20 1556  BP: (!) 155/77 (!) 159/76 130/71 123/66  Pulse:    61  Resp: 17 18 18 20   Temp:    97.7 F (36.5 C)  TempSrc:    Oral  SpO2: 98% 98% 98% 99%  Weight:      Height:         GEN: NAD SKIN: Warm and dry EYES: EOMI ENT: MMM CV: RRR PULM: CTA B ABD: soft, ND, NT, +BS CNS: AAO x 3, non focal EXT: No edema or tenderness   The results of significant diagnostics from this hospitalization (including imaging, microbiology, ancillary and laboratory) are listed below for reference.     Procedures and Diagnostic Studies:   DG Chest 2 View  Result Date: 12/27/2020 CLINICAL DATA:  Chest pain EXAM: CHEST - 2 VIEW COMPARISON:  11/09/2019 FINDINGS: The lungs are clear without focal pneumonia, edema, pneumothorax or pleural effusion. Nodular density/densities projecting over the lungs are compatible with pads for telemetry leads. The cardiopericardial silhouette is within normal limits for size. Hyperinflation suggests emphysema. Probable scarring right costophrenic angle, similar to prior. The visualized bony structures of the thorax show no acute abnormality. IMPRESSION: Hyperexpansion without acute cardiopulmonary findings. Electronically  Signed   By: Misty Stanley M.D.   On: 12/27/2020 17:38   CT Angio Chest/Abd/Pel for Dissection W and/or Wo Contrast  Result Date: 12/27/2020 CLINICAL DATA:  Thoracic aortic aneurysm (TAA) suspected chest pain eval for dissection EXAM: CT ANGIOGRAPHY CHEST, ABDOMEN AND PELVIS TECHNIQUE: Non-contrast CT of the chest was initially obtained. Multidetector CT imaging through the chest, abdomen and pelvis was performed using the standard protocol during bolus administration of intravenous contrast. Multiplanar reconstructed images and MIPs were obtained and reviewed to  evaluate the vascular anatomy. CONTRAST:  12mL OMNIPAQUE IOHEXOL 350 MG/ML SOLN COMPARISON:  CT angiography chest abdomen pelvis 11/09/2019 FINDINGS: CTA CHEST FINDINGS Cardiovascular: Preferential opacification of the thoracic aorta. No evidence of thoracic aortic aneurysm or dissection. At least moderate calcified and noncalcified atherosclerotic plaque. At least 2 vessel coronary artery calcifications. Mild aortic valve leaflet calcifications. Normal heart size. No pericardial effusion. The pulmonary artery is normal in caliber. No central or segmental pulmonary embolus. Mediastinum/Nodes: No enlarged mediastinal, hilar, or axillary lymph nodes. Thyroid gland, trachea, and esophagus demonstrate no significant findings. Lungs/Pleura: Biapical pleural/pulmonary scarring. Few scattered pulmonary micronodules. No pulmonary mass. No focal consolidation. No pleural effusion. No pneumothorax. Musculoskeletal: No chest wall abnormality. No suspicious lytic or blastic osseous lesions. No acute displaced fracture. Multilevel degenerative changes of the spine. Review of the MIP images confirms the above findings. CTA ABDOMEN AND PELVIS FINDINGS VASCULAR Aorta: Severe calcified and noncalcified atherosclerotic plaque. Normal caliber aorta without aneurysm, dissection, vasculitis or significant stenosis. Celiac: At least moderate calcified and noncalcified atherosclerotic plaque. Patent without evidence of aneurysm, dissection, vasculitis or significant stenosis. SMA: At least moderate calcified and noncalcified atherosclerotic plaque. Patent without evidence of aneurysm, dissection, vasculitis or significant stenosis. Renals: At least moderate calcified and noncalcified atherosclerotic plaque. Both renal arteries are patent without evidence of aneurysm, dissection, vasculitis, fibromuscular dysplasia or significant stenosis. IMA: Patent without evidence of aneurysm, dissection, vasculitis or significant stenosis. Inflow:  Severe calcified and noncalcified atherosclerotic plaque. Aneurysmal dilatation of the right common iliac artery measuring up to 1.8 cm status post patent stent graft. Patent without evidence of dissection, vasculitis or significant stenosis. Veins: No obvious venous abnormality within the limitations of this arterial phase study. Review of the MIP images confirms the above findings. NON-VASCULAR Hepatobiliary: No focal liver abnormality. No gallstones, gallbladder wall thickening, or pericholecystic fluid. No biliary dilatation. Pancreas: No focal lesion. Normal pancreatic contour. No surrounding inflammatory changes. No main pancreatic ductal dilatation. Spleen: Normal in size without focal abnormality. Couple of splenule is are noted. Adrenals/Urinary Tract: No adrenal nodule bilaterally. Bilateral kidneys enhance symmetrically. No hydronephrosis. No hydroureter. The urinary bladder is unremarkable. Stomach/Bowel: Stomach is within normal limits. No evidence of bowel wall thickening or dilatation. No pneumatosis. Diffuse left colon and sigmoid diverticulosis. The appendix is not definitely identified and may be surgically absent. Lymphatic: No lymphadenopathy. Reproductive: The prostate is enlarged measuring up to 5 cm. Other: No intraperitoneal free fluid. No intraperitoneal free gas. No organized fluid collection. Musculoskeletal: Bilateral fat containing small inguinal hernias. No suspicious lytic or blastic osseous lesions. No acute displaced fracture. Multilevel degenerative changes of the spine. Review of the MIP images confirms the above findings. IMPRESSION: 1. No acute abnormality of the aorta. 2. Aneurysmal right common iliac artery status post stent graft placement. 3. No pulmonary embolus. 4. No acute intrathoracic, intra-abdominal, intrapelvic abnormality. 5. Other imaging findings of potential clinical significance: Diffuse left colon and sigmoid diverticulosis. Prostatomegaly. Bilateral small fat  containing inguinal hernias. Electronically Signed  By: Iven Finn M.D.   On: 12/27/2020 22:41     Labs:   Basic Metabolic Panel: Recent Labs  Lab 12/27/20 1705 12/28/20 0354  NA 135 135  K 3.8 4.5  CL 100 101  CO2 28 28  GLUCOSE 120* 157*  BUN 23 21  CREATININE 1.01 0.95  CALCIUM 9.5 9.3   GFR Estimated Creatinine Clearance: 87.6 mL/min (by C-G formula based on SCr of 0.95 mg/dL). Liver Function Tests: No results for input(s): AST, ALT, ALKPHOS, BILITOT, PROT, ALBUMIN in the last 168 hours. No results for input(s): LIPASE, AMYLASE in the last 168 hours. No results for input(s): AMMONIA in the last 168 hours. Coagulation profile Recent Labs  Lab 12/28/20 0050  INR 1.1    CBC: Recent Labs  Lab 12/27/20 1705 12/29/20 0504  WBC 6.5 6.6  HGB 15.8 16.6  HCT 45.8 47.9  MCV 96.8 96.8  PLT 200 181   Cardiac Enzymes: No results for input(s): CKTOTAL, CKMB, CKMBINDEX, TROPONINI in the last 168 hours. BNP: Invalid input(s): POCBNP CBG: No results for input(s): GLUCAP in the last 168 hours. D-Dimer No results for input(s): DDIMER in the last 72 hours. Hgb A1c No results for input(s): HGBA1C in the last 72 hours. Lipid Profile No results for input(s): CHOL, HDL, LDLCALC, TRIG, CHOLHDL, LDLDIRECT in the last 72 hours. Thyroid function studies No results for input(s): TSH, T4TOTAL, T3FREE, THYROIDAB in the last 72 hours.  Invalid input(s): FREET3 Anemia work up No results for input(s): VITAMINB12, FOLATE, FERRITIN, TIBC, IRON, RETICCTPCT in the last 72 hours. Microbiology Recent Results (from the past 240 hour(s))  Resp Panel by RT-PCR (Flu A&B, Covid) Nasopharyngeal Swab     Status: None   Collection Time: 12/27/20 11:02 PM   Specimen: Nasopharyngeal Swab; Nasopharyngeal(NP) swabs in vial transport medium  Result Value Ref Range Status   SARS Coronavirus 2 by RT PCR NEGATIVE NEGATIVE Final    Comment: (NOTE) SARS-CoV-2 target nucleic acids are NOT  DETECTED.  The SARS-CoV-2 RNA is generally detectable in upper respiratory specimens during the acute phase of infection. The lowest concentration of SARS-CoV-2 viral copies this assay can detect is 138 copies/mL. A negative result does not preclude SARS-Cov-2 infection and should not be used as the sole basis for treatment or other patient management decisions. A negative result may occur with  improper specimen collection/handling, submission of specimen other than nasopharyngeal swab, presence of viral mutation(s) within the areas targeted by this assay, and inadequate number of viral copies(<138 copies/mL). A negative result must be combined with clinical observations, patient history, and epidemiological information. The expected result is Negative.  Fact Sheet for Patients:  EntrepreneurPulse.com.au  Fact Sheet for Healthcare Providers:  IncredibleEmployment.be  This test is no t yet approved or cleared by the [place] FDA and  has been authorized for detection and/or diagnosis of SARS-CoV-2 by FDA under an Emergency Use Authorization (EUA). This EUA will remain  in effect (meaning this test can be used) for the duration of the COVID-19 declaration under Section 564(b)(1) of the Act, 21 U.S.C.section 360bbb-3(b)(1), unless the authorization is terminated  or revoked sooner.       Influenza A by PCR NEGATIVE NEGATIVE Final   Influenza B by PCR NEGATIVE NEGATIVE Final    Comment: (NOTE) The Xpert Xpress SARS-CoV-2/FLU/RSV plus assay is intended as an aid in the diagnosis of influenza from Nasopharyngeal swab specimens and should not be used as a sole basis for treatment. Nasal washings and aspirates are  unacceptable for Xpert Xpress SARS-CoV-2/FLU/RSV testing.  Fact Sheet for Patients: EntrepreneurPulse.com.au  Fact Sheet for Healthcare Providers: IncredibleEmployment.be  This test is not yet  approved or cleared by the [place] FDA and has been authorized for detection and/or diagnosis of SARS-CoV-2 by FDA under an Emergency Use Authorization (EUA). This EUA will remain in effect (meaning this test can be used) for the duration of the COVID-19 declaration under Section 564(b)(1) of the Act, 21 U.S.C. section 360bbb-3(b)(1), unless the authorization is terminated or revoked.  Performed at [facility], [address] [place], [place] [address]      Discharge Instructions:   Discharge Instructions     AMB Referral to Cardiac Rehabilitation - Phase II   Complete by: As directed    Diagnosis: Stable Angina   Diet - low sodium heart healthy   Complete by: As directed    Increase activity slowly   Complete by: As directed       Allergies as of 12/29/2020       Reactions   Contrast Media [iodinated Diagnostic Agents] Hives   Iodine Hives        Medication List     STOP taking these medications    methocarbamol 500 MG tablet Commonly known as: Robaxin       TAKE these medications    albuterol 108 (90 Base) MCG/ACT inhaler Commonly known as: VENTOLIN HFA Inhale 2 puffs into the lungs every 6 (six) hours as needed for wheezing or shortness of breath.   aspirin EC 81 MG tablet Take 1 tablet (81 mg total) by mouth daily.   betamethasone valerate 0.1 % cream Commonly known as: VALISONE Apply topically.   cilostazol 50 MG tablet Commonly known as: PLETAL Take by mouth 2 (two) times daily.   clopidogrel 75 MG tablet Commonly known as: PLAVIX Take 1 tablet by mouth daily.   indomethacin 25 MG capsule Commonly known as: INDOCIN Take 1 capsule (25 mg total) by mouth 2 (two) times daily as needed.   isosorbide mononitrate 120 MG 24 hr tablet Commonly known as: IMDUR Take 120 mg by mouth daily.   lisinopril 10 MG tablet Commonly known as: ZESTRIL Take 1 tablet by mouth daily.   metoprolol tartrate 50 MG tablet Commonly known as:  LOPRESSOR Take 50-75 mg by mouth 2 (two) times a day. Take 50MG  by mouth every morning and 75MG  every evening   nitroGLYCERIN 0.4 MG SL tablet Commonly known as: NITROSTAT Place 1 tablet under the tongue as needed.   pantoprazole 40 MG tablet Commonly known as: PROTONIX Take 1 tablet (40 mg total) by mouth daily.   simvastatin 40 MG tablet Commonly known as: ZOCOR Take 1 tablet (40 mg total) by mouth at bedtime.        Follow-up Information     Paraschos, Alexander, MD. Schedule an appointment as soon as possible for a visit .   Specialty: Cardiology Contact information: [facility] [place] [address] [phone]                  Time coordinating discharge: 28 minutes  Signed:  Jennye Boroughs  [facility] 12/29/2020, 4:38 PM   Pager on [email or web address]. If 7PM-7AM, please contact night-coverage at [email or web address]

## 2020-12-29 NOTE — Progress Notes (Signed)
Patient discharged to home, ambulated out of unit accompanied by Care RN with all belongings to meet family at [facility]tr[facility] to drive him home.  Steady gait.  No changes noted to Right Wrist dressing.

## 2020-12-29 NOTE — Progress Notes (Signed)
ANTICOAGULATION CONSULT NOTE  Pharmacy Consult for Heparin Indication: chest pain/ACS  Allergies  Allergen Reactions   Contrast Media [Iodinated Diagnostic Agents] Hives   Iodine Hives    Patient Measurements: Height: 6\' 1"  (185.4 cm) Weight: 88.4 kg (194 lb 14.4 oz) IBW/kg (Calculated) : 79.9 Heparin Dosing Weight: 90.7 kg   Vital Signs: Temp: 97.9 F (36.6 C) (06/23 0723) Temp Source: Oral (06/23 0723) BP: 144/69 (06/23 0723) Pulse Rate: 65 (06/23 0723)  Labs: Recent Labs    12/27/20 1705 12/27/20 2215 12/28/20 0050 12/28/20 0354 12/28/20 0826 12/28/20 1629 12/29/20 0504  HGB 15.8  --   --   --   --   --  16.6  HCT 45.8  --   --   --   --   --  47.9  PLT 200  --   --   --   --   --  181  APTT  --   --  29  --   --   --   --   LABPROT  --   --  13.8  --   --   --   --   INR  --   --  1.1  --   --   --   --   HEPARINUNFRC  --   --   --   --  0.47 0.44 0.76*  CREATININE 1.01  --   --  0.95  --   --   --   TROPONINIHS 4 4  --   --   --   --   --      Estimated Creatinine Clearance: 87.6 mL/min (by C-G formula based on SCr of 0.95 mg/dL).   Medical History: Past Medical History:  Diagnosis Date   COPD (chronic obstructive pulmonary disease) ([place])    Coronary artery disease    s/p PCI   Fracture of left ankle, closed, initial encounter 10/23/2017   GERD (gastroesophageal reflux disease)    Glaucoma    Gout    Hx of heart artery stent 01/12/2017   Hypercholesteremia    Hypertension    PAD (peripheral artery disease) ([facility])    Pain in limb 07/13/2016   Syncope 09/16/2017   Unstable angina ([facility]) 01/12/2017   Vertigo     Medications:  DAPT with Plavix 75 mg daily and aspirin 81 mg daily No PTA anticoagulation  Assessment: 65 y.o. male with medical history significant for CAD s/p multiple stents, bilateral lower extremity stent, HTN, PVD, COPD, and HLD who presents with chest pain. Pharmacy has been consulted for heparin dosing for ACS/NSTEMI. Heparin Dosing  Weight: 90.7 kg  Hgb and plts WNL  Date Time HL Rate/comment 6/22 0826 0.47 1200 units/hr, therapeutic  6/23 0504 0.76 1200 units/hr, supratherapeutic  Goal of Therapy:  Heparin level 0.3-0.7 units/ml Monitor platelets by anticoagulation protocol: Yes   Plan:  Heparin level is supratherapeutic. Will decrease heparin infusion to 1000 units/hr. Recheck heparin level in 6 hours. CBC daily while on heparin.   Oswald Hillock, PharmD 12/29/2020,7:30 AM

## 2020-12-29 NOTE — Progress Notes (Signed)
[facility] Encounter Note  Patient: Aaron [name] / Admit Date: 12/27/2020 / Date of Encounter: 12/29/2020, 1:02 PM   Subjective: Patient feeling well today no evidence of chest pain congestive heart failure or other significant concerns.  Troponin level suggesting no evidence of myocardial infarction or acute coronary syndrome  Cardiac catheterization showing mild to moderate diffuse three-vessel coronary atherosclerosis and patent stents but no evidence of progression of coronary artery disease from previous cath 2 years prior  Echocardiogram previously showing normal LV systolic function and no evidence of significant valvular heart disease with ejection fraction of 53%  Review of Systems: Positive for: None Negative for: Vision change, hearing change, syncope, dizziness, nausea, vomiting,diarrhea, bloody stool, stomach pain, cough, congestion, diaphoresis, urinary frequency, urinary pain,skin lesions, skin rashes Others previously listed  Objective: Telemetry: Normal sinus rhythm Physical Exam: Blood pressure (!) 153/81, pulse (!) 58, temperature 98.1 F (36.7 C), temperature source Oral, resp. rate 13, height 6\' 1"  (1.854 m), weight 88 kg, SpO2 97 %. Body mass index is 25.6 kg/m. General: Well developed, well nourished, in no acute distress. Head: Normocephalic, atraumatic, sclera non-icteric, no xanthomas, nares are without discharge. Neck: No apparent masses Lungs: Normal respirations with no wheezes, no rhonchi, no rales , no crackles   Heart: Regular rate and rhythm, normal S1 S2, no murmur, no rub, no gallop, PMI is normal size and placement, carotid upstroke normal without bruit, jugular venous pressure normal Abdomen: Soft, non-tender, non-distended with normoactive bowel sounds. No hepatosplenomegaly. Abdominal aorta is normal size without bruit Extremities: No edema, no clubbing, no cyanosis, no ulcers,  Peripheral: 2+ radial, 2+ femoral, 2+ dorsal  pedal pulses Neuro: Alert and oriented. Moves all extremities spontaneously. Psych:  Responds to questions appropriately with a normal affect.   Intake/Output Summary (Last 24 hours) at 12/29/2020 1302 Last data filed at 12/28/2020 1906 Gross per 24 hour  Intake 446.3 ml  Output --  Net 446.3 ml    Inpatient Medications:   [MAR Hold] aspirin EC  81 mg Oral Daily   [MAR Hold] cilostazol  50 mg Oral BID   [MAR Hold] clopidogrel  75 mg Oral Daily   diphenhydrAMINE       famotidine       [MAR Hold] isosorbide mononitrate  120 mg Oral Daily   [MAR Hold] lisinopril  10 mg Oral Daily   methylPREDNISolone sodium succinate       [MAR Hold] metoprolol tartrate  50 mg Oral q morning   [MAR Hold] metoprolol tartrate  75 mg Oral QHS   [MAR Hold] pantoprazole  40 mg Oral Daily   [MAR Hold] senna  1 tablet Oral Daily   [MAR Hold] simvastatin  40 mg Oral QHS   [MAR Hold] sodium chloride flush  3 mL Intravenous Q12H   Infusions:   sodium chloride     sodium chloride 1 mL/kg/hr (12/29/20 0653)   heparin 1,000 Units/hr (12/29/20 0749)    Labs: Recent Labs    12/27/20 1705 12/28/20 0354  NA 135 135  K 3.8 4.5  CL 100 101  CO2 28 28  GLUCOSE 120* 157*  BUN 23 21  CREATININE 1.01 0.95  CALCIUM 9.5 9.3   No results for input(s): AST, ALT, ALKPHOS, BILITOT, PROT, ALBUMIN in the last 72 hours. Recent Labs    12/27/20 1705 12/29/20 0504  WBC 6.5 6.6  HGB 15.8 16.6  HCT 45.8 47.9  MCV 96.8 96.8  PLT 200 181   No results for  input(s): CKTOTAL, CKMB, TROPONINI in the last 72 hours. Invalid input(s): POCBNP No results for input(s): HGBA1C in the last 72 hours.   Weights: Filed Weights   12/27/20 1702 12/28/20 1900 12/29/20 1135  Weight: 90.7 kg 88.4 kg 88 kg     Radiology/Studies:  DG Chest 2 View  Result Date: 12/27/2020 CLINICAL DATA:  Chest pain EXAM: CHEST - 2 VIEW COMPARISON:  11/09/2019 FINDINGS: The lungs are clear without focal pneumonia, edema, pneumothorax or  pleural effusion. Nodular density/densities projecting over the lungs are compatible with pads for telemetry leads. The cardiopericardial silhouette is within normal limits for size. Hyperinflation suggests emphysema. Probable scarring right costophrenic angle, similar to prior. The visualized bony structures of the thorax show no acute abnormality. IMPRESSION: Hyperexpansion without acute cardiopulmonary findings. Electronically Signed   By: Misty Stanley M.D.   On: 12/27/2020 17:38   CT Angio Chest/Abd/Pel for Dissection W and/or Wo Contrast  Result Date: 12/27/2020 CLINICAL DATA:  Thoracic aortic aneurysm (TAA) suspected chest pain eval for dissection EXAM: CT ANGIOGRAPHY CHEST, ABDOMEN AND PELVIS TECHNIQUE: Non-contrast CT of the chest was initially obtained. Multidetector CT imaging through the chest, abdomen and pelvis was performed using the standard protocol during bolus administration of intravenous contrast. Multiplanar reconstructed images and MIPs were obtained and reviewed to evaluate the vascular anatomy. CONTRAST:  124mL OMNIPAQUE IOHEXOL 350 MG/ML SOLN COMPARISON:  CT angiography chest abdomen pelvis 11/09/2019 FINDINGS: CTA CHEST FINDINGS Cardiovascular: Preferential opacification of the thoracic aorta. No evidence of thoracic aortic aneurysm or dissection. At least moderate calcified and noncalcified atherosclerotic plaque. At least 2 vessel coronary artery calcifications. Mild aortic valve leaflet calcifications. Normal heart size. No pericardial effusion. The pulmonary artery is normal in caliber. No central or segmental pulmonary embolus. Mediastinum/Nodes: No enlarged mediastinal, hilar, or axillary lymph nodes. Thyroid gland, trachea, and esophagus demonstrate no significant findings. Lungs/Pleura: Biapical pleural/pulmonary scarring. Few scattered pulmonary micronodules. No pulmonary mass. No focal consolidation. No pleural effusion. No pneumothorax. Musculoskeletal: No chest wall  abnormality. No suspicious lytic or blastic osseous lesions. No acute displaced fracture. Multilevel degenerative changes of the spine. Review of the MIP images confirms the above findings. CTA ABDOMEN AND PELVIS FINDINGS VASCULAR Aorta: Severe calcified and noncalcified atherosclerotic plaque. Normal caliber aorta without aneurysm, dissection, vasculitis or significant stenosis. Celiac: At least moderate calcified and noncalcified atherosclerotic plaque. Patent without evidence of aneurysm, dissection, vasculitis or significant stenosis. SMA: At least moderate calcified and noncalcified atherosclerotic plaque. Patent without evidence of aneurysm, dissection, vasculitis or significant stenosis. Renals: At least moderate calcified and noncalcified atherosclerotic plaque. Both renal arteries are patent without evidence of aneurysm, dissection, vasculitis, fibromuscular dysplasia or significant stenosis. IMA: Patent without evidence of aneurysm, dissection, vasculitis or significant stenosis. Inflow: Severe calcified and noncalcified atherosclerotic plaque. Aneurysmal dilatation of the right common iliac artery measuring up to 1.8 cm status post patent stent graft. Patent without evidence of dissection, vasculitis or significant stenosis. Veins: No obvious venous abnormality within the limitations of this arterial phase study. Review of the MIP images confirms the above findings. NON-VASCULAR Hepatobiliary: No focal liver abnormality. No gallstones, gallbladder wall thickening, or pericholecystic fluid. No biliary dilatation. Pancreas: No focal lesion. Normal pancreatic contour. No surrounding inflammatory changes. No main pancreatic ductal dilatation. Spleen: Normal in size without focal abnormality. Couple of splenule is are noted. Adrenals/Urinary Tract: No adrenal nodule bilaterally. Bilateral kidneys enhance symmetrically. No hydronephrosis. No hydroureter. The urinary bladder is unremarkable. Stomach/Bowel:  Stomach is within normal limits. No evidence of bowel wall  thickening or dilatation. No pneumatosis. Diffuse left colon and sigmoid diverticulosis. The appendix is not definitely identified and may be surgically absent. Lymphatic: No lymphadenopathy. Reproductive: The prostate is enlarged measuring up to 5 cm. Other: No intraperitoneal free fluid. No intraperitoneal free gas. No organized fluid collection. Musculoskeletal: Bilateral fat containing small inguinal hernias. No suspicious lytic or blastic osseous lesions. No acute displaced fracture. Multilevel degenerative changes of the spine. Review of the MIP images confirms the above findings. IMPRESSION: 1. No acute abnormality of the aorta. 2. Aneurysmal right common iliac artery status post stent graft placement. 3. No pulmonary embolus. 4. No acute intrathoracic, intra-abdominal, intrapelvic abnormality. 5. Other imaging findings of potential clinical significance: Diffuse left colon and sigmoid diverticulosis. Prostatomegaly. Bilateral small fat containing inguinal hernias. Electronically Signed   By: Iven Finn M.D.   On: 12/27/2020 22:41     Assessment and Recommendation  65 y.o. male with known three-vessel coronary artery disease status post multiple stents in the past with noncardiac angina and no evidence of congestive heart failure or myocardial infarction with a cardiac catheterization showing continued diffuse three-vessel disease with patent stents and no need for further intervention 1.  No further cardiac interventions at this time due to no evidence of myocardial infarction or congestive heart failure with patient having no significant new stenoses by cardiac catheterization 2.  Patient was counseled on the discontinuation of tobacco abuse 3.  Continuation of isosorbide for chest discomfort 4.  Begin ambulation and follow-up for improvements of symptoms and continuation of previous outpatient medication management for risk factor  modification.  If ambulating well with no evidence of significant symptoms okay for discharge home from cardiac standpoint with follow-up with Dr. Saralyn Pilar  Signed, Serafina Royals M.D. FACC

## 2020-12-30 ENCOUNTER — Telehealth: Payer: Self-pay

## 2020-12-30 ENCOUNTER — Encounter: Payer: Self-pay | Admitting: Internal Medicine

## 2020-12-30 NOTE — Telephone Encounter (Signed)
Transition Care Management Unsuccessful Follow-up Telephone Call  Date of discharge and from where:  12/29/20 [facility]  Attempts:  1st Attempt  Reason for unsuccessful TCM follow-up call:  Unable to leave message; no voicemail. Pt may reach me at [phone]

## 2021-01-03 NOTE — Telephone Encounter (Signed)
Transition Care Management Follow-up Telephone Call Date of discharge and from where: 12/29/20 [facility] How have you been since you were released from the hospital? Pt states he is doing well.  Any questions or concerns? No  Items Reviewed: Did the pt receive and understand the discharge instructions provided? Yes  Medications obtained and verified? Yes  Other? No  Any new allergies since your discharge? No  Dietary orders reviewed? Yes Do you have support at home? Yes   Home Care and Equipment/Supplies: Were home health services ordered? no Were any new equipment or medical supplies ordered?  No  Functional Questionnaire: (I = Independent and D = Dependent) ADLs: I  Bathing/Dressing- I  Meal Prep- I  Eating- I  Maintaining continence- I  Transferring/Ambulation- I  Managing Meds- I  Follow up appointments reviewed:  PCP Hospital f/u appt confirmed? No ; please advise for appointment. [facility] f/u appt confirmed? Yes  Scheduled to see Dr. Saralyn Pilar on 01/24/21. Are transportation arrangements needed? No  If their condition worsens, is the pt aware to call PCP or go to the Emergency Dept.? Yes Was the patient provided with contact information for the PCP's office or ED? Yes Was to pt encouraged to call back with questions or concerns? Yes

## 2021-01-04 ENCOUNTER — Encounter: Payer: Self-pay | Admitting: Internal Medicine

## 2021-01-04 ENCOUNTER — Ambulatory Visit (INDEPENDENT_AMBULATORY_CARE_PROVIDER_SITE_OTHER): Payer: PPO | Admitting: Internal Medicine

## 2021-01-04 ENCOUNTER — Other Ambulatory Visit: Payer: Self-pay | Admitting: Internal Medicine

## 2021-01-04 ENCOUNTER — Other Ambulatory Visit: Payer: Self-pay

## 2021-01-04 VITALS — BP 110/72 | HR 74 | Temp 98.4°F | Ht 73.0 in | Wt 193.0 lb

## 2021-01-04 DIAGNOSIS — M62830 Muscle spasm of back: Secondary | ICD-10-CM

## 2021-01-04 MED ORDER — METHOCARBAMOL 500 MG PO TABS: 500.0000 mg | ORAL_TABLET | Freq: Every evening | ORAL | Status: DC | PRN

## 2021-01-04 MED ORDER — METHOCARBAMOL 500 MG PO TABS: 500.0000 mg | ORAL_TABLET | Freq: Every evening | ORAL | 0 refills | Status: DC | PRN

## 2021-01-04 NOTE — Progress Notes (Signed)
Date:  01/04/2021   Name:  Aaron [name]   DOB:  1955-11-23   MRN:  [id number]   Chief Complaint: Back Pain (X3 days,Lower back pain, right side, when standing or sitting )  Back Pain This is a new problem. The current episode started yesterday. The problem is unchanged. The pain is present in the lumbar spine (and right flank). The pain does not radiate. The pain is mild. The symptoms are aggravated by bending and twisting. Associated symptoms include chest pain (just evaluated with cath - stable findings no medication changes made). Pertinent negatives include no fever, headaches, numbness or weakness. He has tried nothing for the symptoms.   Lab Results  Component Value Date   CREATININE 0.95 12/28/2020   BUN 21 12/28/2020   NA 135 12/28/2020   K 4.5 12/28/2020   CL 101 12/28/2020   CO2 28 12/28/2020   Lab Results  Component Value Date   CHOL 146 11/05/2019   HDL 38 (L) 11/05/2019   LDLCALC 82 11/05/2019   TRIG 149 11/05/2019   CHOLHDL 3.8 11/05/2019   Lab Results  Component Value Date   TSH 3.214 02/06/2019   Lab Results  Component Value Date   HGBA1C 5.9 (H) 05/16/2020   Lab Results  Component Value Date   WBC 6.6 12/29/2020   HGB 16.6 12/29/2020   HCT 47.9 12/29/2020   MCV 96.8 12/29/2020   PLT 181 12/29/2020   Lab Results  Component Value Date   ALT 40 11/09/2019   AST 31 11/09/2019   ALKPHOS 52 11/09/2019   BILITOT 0.8 11/09/2019     Review of Systems  Constitutional:  Negative for chills, fatigue and fever.  Respiratory:  Negative for cough, chest tightness and shortness of breath.   Cardiovascular:  Positive for chest pain (just evaluated with cath - stable findings no medication changes made).  Musculoskeletal:  Positive for back pain and myalgias.  Neurological:  Negative for dizziness, weakness, numbness and headaches.   Patient Active Problem List   Diagnosis Date Noted   Unstable angina ([place]) 12/27/2020   Aortic atherosclerosis ([place])  07/10/2020   Stage 2 moderate COPD by GOLD classification ([place]) 05/16/2020   Dyspepsia and disorder of function of stomach 05/16/2020   Cervical disc disorder of mid-cervical region 05/10/2020   Polyp of descending colon    History of colonic polyps    Alcohol dependence ([place]) 07/21/2019   Mixed hyperlipidemia 10/28/2018   Tobacco use disorder 07/13/2016   Prediabetes 07/27/2015   Controlled gout 12/22/2014   Coronary artery disease involving native coronary artery of native heart with angina pectoris ([place]) 10/09/2014   Essential (primary) hypertension 10/09/2014   Dupuytren's disease of palm 10/09/2014   Psoriasis 10/09/2014   Pain in shoulder 10/09/2014   H/O cardiac catheterization 01/27/2014   Peripheral vascular disease ([place]) 01/27/2014    Allergies  Allergen Reactions   Contrast Media [Iodinated Diagnostic Agents] Hives   Iodine Hives    Past Surgical History:  Procedure Laterality Date   APPENDECTOMY     cardiac stents   2012   COLONOSCOPY  03/30/2013   [place]   COLONOSCOPY WITH PROPOFOL N/A 12/10/2019   Procedure: COLONOSCOPY WITH PROPOFOL;  Surgeon: [name], MD;  Location: [place];  Service: Endoscopy;  Laterality: N/A;   COLONOSCOPY WITH PROPOFOL N/A 01/18/2020   Procedure: COLONOSCOPY WITH PROPOFOL;  Surgeon: [name], MD;  Location: [place];  Service: Endoscopy;  Laterality: N/A;  4  LEFT HEART CATH AND CORONARY ANGIOGRAPHY N/A 12/29/2020   Procedure: LEFT HEART CATH AND CORONARY ANGIOGRAPHY;  Surgeon: Corey Skains, MD;  Location: [place] [facility];  Service: Cardiovascular;  Laterality: N/A;   LOWER EXTREMITY ANGIOGRAPHY Right 09/25/2018   Procedure: LOWER EXTREMITY ANGIOGRAPHY;  Surgeon: Algernon Huxley, MD;  Location: [place] [facility];  Service: Cardiovascular;  Laterality: Right;   LOWER EXTREMITY ANGIOGRAPHY Left 03/17/2020   Procedure: LOWER EXTREMITY ANGIOGRAPHY;  Surgeon: Algernon Huxley, MD;  Location: [place] [facility];  Service: Cardiovascular;  Laterality: Left;   POLYPECTOMY N/A 01/18/2020   Procedure: POLYPECTOMY;  Surgeon: [name], MD;  Location: [place];  Service: Endoscopy;  Laterality: N/A;    Social History   Tobacco Use   Smoking status: Every Day    Packs/day: 1.00    Years: 46.00    Pack years: 46.00    Types: Cigarettes   Smokeless tobacco: Never   Tobacco comments:    chantix   Vaping Use   Vaping Use: Never used  Substance Use Topics   Alcohol use: Yes    Comment: quit drinking around 04/2019(May have drink 1x/mo)   Drug use: No     Medication list has been reviewed and updated.  Current Meds  Medication Sig   albuterol (VENTOLIN HFA) 108 (90 Base) MCG/ACT inhaler Inhale 2 puffs into the lungs every 6 (six) hours as needed for wheezing or shortness of breath.   aspirin EC 81 MG tablet Take 1 tablet (81 mg total) by mouth daily. (Patient taking differently: Take 325 mg by mouth daily.)   cilostazol (PLETAL) 50 MG tablet Take by mouth 2 (two) times daily.    clopidogrel (PLAVIX) 75 MG tablet Take 1 tablet by mouth daily.   indomethacin (INDOCIN) 25 MG capsule Take 1 capsule (25 mg total) by mouth 2 (two) times daily as needed.   isosorbide mononitrate (IMDUR) 120 MG 24 hr tablet Take 120 mg by mouth daily.   lisinopril (PRINIVIL,ZESTRIL) 10 MG tablet Take 1 tablet by mouth daily.   metoprolol tartrate (LOPRESSOR) 50 MG tablet Take 50-75 mg by mouth 2 (two) times a day. Take 50MG  by mouth every morning and 75MG  every evening   nitroGLYCERIN (NITROSTAT) 0.4 MG SL tablet Place 1 tablet under the tongue as needed.   pantoprazole (PROTONIX) 40 MG tablet Take 1 tablet (40 mg total) by mouth daily.   simvastatin (ZOCOR) 40 MG tablet Take 1 tablet (40 mg total) by mouth at bedtime.   [DISCONTINUED] betamethasone valerate (VALISONE) 0.1 % cream Apply topically.    PHQ 2/9 Scores 01/04/2021 09/16/2020 07/12/2020 05/16/2020  PHQ - 2 Score 0 0 0 0  PHQ- 9 Score 1 0 0 0     GAD 7 : Generalized Anxiety Score 01/04/2021 09/16/2020 07/12/2020 05/16/2020  Nervous, Anxious, on Edge 0 0 0 0  Control/stop worrying 0 0 0 0  Worry too much - different things 0 0 0 0  Trouble relaxing 0 0 0 0  Restless 0 0 0 0  Easily annoyed or irritable 0 0 0 0  Afraid - awful might happen 0 0 0 0  Total GAD 7 Score 0 0 0 0  Anxiety Difficulty Not difficult at all - - Not difficult at all    BP Readings from Last 3 Encounters:  01/04/21 110/72  12/29/20 123/66  09/16/20 (!) 112/58    Physical Exam Vitals and nursing note reviewed.  Constitutional:  General: He is not in acute distress.    Appearance: Normal appearance. He is well-developed.  HENT:     Head: Normocephalic and atraumatic.  Cardiovascular:     Rate and Rhythm: Normal rate and regular rhythm.  Pulmonary:     Effort: Pulmonary effort is normal. No respiratory distress.     Breath sounds: No wheezing or rhonchi.  Musculoskeletal:     Cervical back: Spasms (on right) and tenderness present.     Lumbar back: Normal. No spasms, tenderness or bony tenderness.       Back:     Comments: Muscle spasm of the right flank region   Skin:    General: Skin is warm and dry.     Findings: No rash.  Neurological:     Mental Status: He is alert and oriented to person, place, and time.     Sensory: Sensation is intact.     Motor: Motor function is intact.     Deep Tendon Reflexes:     Reflex Scores:      Patellar reflexes are 2+ on the right side and 2+ on the left side.    Comments: SLR is negative bilaterally  Psychiatric:        Mood and Affect: Mood normal.        Behavior: Behavior normal.     Wt Readings from Last 3 Encounters:  01/04/21 193 lb (87.5 kg)  12/29/20 194 lb (88 kg)  09/16/20 198 lb (89.8 kg)    BP 110/72   Pulse 74   Temp 98.4 F (36.9 C) (Oral)   Ht 6\' 1"  (1.854 m)   Wt 193 lb (87.5 kg)   SpO2 97%   BMI 25.46 kg/m   Assessment and Plan: 1. Muscle spasm of back Recommend  heat to neck and lower back/flank Take robaxin at bedtime Follow up if worsening - methocarbamol (ROBAXIN) 500 MG tablet; Take 1 tablet (500 mg total) by mouth at bedtime as needed for muscle spasms.  Dispense: 20 tablet; Refill: O   Partially dictated using Editor, commissioning. Any errors are unintentional.  Halina Maidens, MD [facility]  01/04/2021

## 2021-01-04 NOTE — Patient Instructions (Signed)
Use heat on lower back/side and neck and shoulders

## 2021-01-05 DIAGNOSIS — L300 Nummular dermatitis: Secondary | ICD-10-CM | POA: Diagnosis not present

## 2021-01-24 DIAGNOSIS — Z9889 Other specified postprocedural states: Secondary | ICD-10-CM | POA: Diagnosis not present

## 2021-01-24 DIAGNOSIS — I10 Essential (primary) hypertension: Secondary | ICD-10-CM | POA: Diagnosis not present

## 2021-01-24 DIAGNOSIS — I25118 Atherosclerotic heart disease of native coronary artery with other forms of angina pectoris: Secondary | ICD-10-CM | POA: Diagnosis not present

## 2021-01-24 DIAGNOSIS — E785 Hyperlipidemia, unspecified: Secondary | ICD-10-CM | POA: Diagnosis not present

## 2021-02-02 ENCOUNTER — Other Ambulatory Visit (INDEPENDENT_AMBULATORY_CARE_PROVIDER_SITE_OTHER): Payer: Self-pay | Admitting: Nurse Practitioner

## 2021-02-02 DIAGNOSIS — I70219 Atherosclerosis of native arteries of extremities with intermittent claudication, unspecified extremity: Secondary | ICD-10-CM

## 2021-02-03 ENCOUNTER — Other Ambulatory Visit: Payer: Self-pay

## 2021-02-03 ENCOUNTER — Ambulatory Visit (INDEPENDENT_AMBULATORY_CARE_PROVIDER_SITE_OTHER): Payer: PPO | Admitting: Vascular Surgery

## 2021-02-03 ENCOUNTER — Ambulatory Visit (INDEPENDENT_AMBULATORY_CARE_PROVIDER_SITE_OTHER): Payer: PPO

## 2021-02-03 VITALS — BP 110/69 | HR 67 | Ht 73.0 in | Wt 194.0 lb

## 2021-02-03 DIAGNOSIS — I10 Essential (primary) hypertension: Secondary | ICD-10-CM | POA: Diagnosis not present

## 2021-02-03 DIAGNOSIS — I70219 Atherosclerosis of native arteries of extremities with intermittent claudication, unspecified extremity: Secondary | ICD-10-CM

## 2021-02-03 DIAGNOSIS — E782 Mixed hyperlipidemia: Secondary | ICD-10-CM

## 2021-02-03 DIAGNOSIS — F17210 Nicotine dependence, cigarettes, uncomplicated: Secondary | ICD-10-CM

## 2021-02-03 DIAGNOSIS — F17200 Nicotine dependence, unspecified, uncomplicated: Secondary | ICD-10-CM

## 2021-02-03 DIAGNOSIS — I739 Peripheral vascular disease, unspecified: Secondary | ICD-10-CM

## 2021-02-03 DIAGNOSIS — Z716 Tobacco abuse counseling: Secondary | ICD-10-CM

## 2021-02-03 NOTE — Assessment & Plan Note (Signed)
lipid control important in reducing the progression of atherosclerotic disease. Continue statin therapy  

## 2021-02-03 NOTE — Assessment & Plan Note (Signed)
ABIs today are slightly better at 0.92 on the right and 1.01 on the left with digit pressures of 95 bilaterally and biphasic waveforms.  Doing well.  Still smoking and understands importance of quitting.  Continue current medical regimen.  Recheck in 6 months and then if things are stable we can probably go to once a year.

## 2021-02-03 NOTE — Assessment & Plan Note (Signed)
We had a discussion for approximately 3-4 minutes regarding the absolute need for smoking cessation due to the deleterious nature of tobacco on the vascular system. We discussed the tobacco use would diminish patency of any intervention, and likely significantly worsen progressio of disease. We discussed multiple agents for quitting including replacement therapy or medications to reduce cravings such as Chantix. The patient voices their understanding of the importance of smoking cessation.  He has cut back and is working on stopping entirely.

## 2021-02-03 NOTE — Assessment & Plan Note (Signed)
blood pressure control important in reducing the progression of atherosclerotic disease. On appropriate oral medications.  

## 2021-02-03 NOTE — Progress Notes (Signed)
MRN : [id number]  Aaron [name] is a 65 y.o. ([birth year]) male who presents with chief complaint of  Chief Complaint  Patient presents with   Follow-up    6 MO [place]  .  History of Present Illness: Patient returns today in follow up of his peripheral arterial disease.  He is still smoking although he says he has cut way back.  He is trying to quit altogether.  His legs are doing much better.  He is not really having much pain with walking at this point.  He does get some mild hip and buttock claudication but this is markedly improved after revascularization last September.  ABIs today are slightly better at 0.92 on the right and 1.01 on the left with digit pressures of 95 bilaterally and biphasic waveforms.  Current Outpatient Medications  Medication Sig Dispense Refill   albuterol (VENTOLIN HFA) 108 (90 Base) MCG/ACT inhaler Inhale 2 puffs into the lungs every 6 (six) hours as needed for wheezing or shortness of breath. 6.7 g 5   aspirin EC 81 MG tablet Take 1 tablet (81 mg total) by mouth daily. (Patient taking differently: Take 325 mg by mouth daily.) 150 tablet 2   cilostazol (PLETAL) 50 MG tablet Take by mouth 2 (two) times daily.      clopidogrel (PLAVIX) 75 MG tablet Take 1 tablet by mouth daily.     indomethacin (INDOCIN) 25 MG capsule Take 1 capsule (25 mg total) by mouth 2 (two) times daily as needed. 30 capsule 1   isosorbide mononitrate (IMDUR) 120 MG 24 hr tablet Take 120 mg by mouth daily.     lisinopril (PRINIVIL,ZESTRIL) 10 MG tablet Take 1 tablet by mouth daily.     methocarbamol (ROBAXIN) 500 MG tablet Take 1 tablet (500 mg total) by mouth at bedtime as needed for muscle spasms. 20 tablet 0   metoprolol tartrate (LOPRESSOR) 50 MG tablet Take 50-75 mg by mouth 2 (two) times a day. Take '50MG'$  by mouth every morning and '75MG'$  every evening     nitroGLYCERIN (NITROSTAT) 0.4 MG SL tablet Place 1 tablet under the tongue as needed.     pantoprazole (PROTONIX) 40 MG tablet Take 1  tablet (40 mg total) by mouth daily. 30 tablet 3   simvastatin (ZOCOR) 40 MG tablet Take 1 tablet (40 mg total) by mouth at bedtime. 30 tablet 5   No current facility-administered medications for this visit.    Past Medical History:  Diagnosis Date   COPD (chronic obstructive pulmonary disease) ([place])    Coronary artery disease    s/p PCI   Fracture of left ankle, closed, initial encounter 10/23/2017   GERD (gastroesophageal reflux disease)    Glaucoma    Gout    Hx of heart artery stent 01/12/2017   Hypercholesteremia    Hypertension    PAD (peripheral artery disease) ([place])    Pain in limb 07/13/2016   Syncope 09/16/2017   Unstable angina ([place]) 01/12/2017   Vertigo     Past Surgical History:  Procedure Laterality Date   APPENDECTOMY     cardiac stents   2012   COLONOSCOPY  03/30/2013   [place]   COLONOSCOPY WITH PROPOFOL N/A 12/10/2019   Procedure: COLONOSCOPY WITH PROPOFOL;  Surgeon: [name], MD;  Location: [facility];  Service: Endoscopy;  Laterality: N/A;   COLONOSCOPY WITH PROPOFOL N/A 01/18/2020   Procedure: COLONOSCOPY WITH PROPOFOL;  Surgeon: [name], MD;  Location: [facility];  Service: Endoscopy;  Laterality: N/A;  4   LEFT HEART CATH AND CORONARY ANGIOGRAPHY N/A 12/29/2020   Procedure: LEFT HEART CATH AND CORONARY ANGIOGRAPHY;  Surgeon: Corey Skains, MD;  Location: [facility];  Service: Cardiovascular;  Laterality: N/A;   LOWER EXTREMITY ANGIOGRAPHY Right 09/25/2018   Procedure: LOWER EXTREMITY ANGIOGRAPHY;  Surgeon: Algernon Huxley, MD;  Location: [facility];  Service: Cardiovascular;  Laterality: Right;   LOWER EXTREMITY ANGIOGRAPHY Left 03/17/2020   Procedure: LOWER EXTREMITY ANGIOGRAPHY;  Surgeon: Algernon Huxley, MD;  Location: [facility];  Service: Cardiovascular;  Laterality: Left;   POLYPECTOMY N/A 01/18/2020   Procedure: POLYPECTOMY;  Surgeon: [name], MD;  Location: [facility];  Service: Endoscopy;   Laterality: N/A;     Social History   Tobacco Use   Smoking status: Every Day    Packs/day: 1.00    Years: 46.00    Pack years: 46.00    Types: Cigarettes   Smokeless tobacco: Never  Vaping Use   Vaping Use: Never used  Substance Use Topics   Alcohol use: Not Currently    Comment: quit drinking around 04/2019(May have drink 1x/mo)   Drug use: No       Family History  Problem Relation Age of Onset   Hypertension Mother    Diabetes Mother    Heart failure Father        age 72   Cancer Father    Ovarian cancer Sister    Heart disease Brother      Allergies  Allergen Reactions   Contrast Media [Iodinated Diagnostic Agents] Hives   Iodine Hives     REVIEW OF SYSTEMS (Negative unless checked)  Constitutional: '[]'$ Weight loss  '[]'$ Fever  '[]'$ Chills Cardiac: '[]'$ Chest pain   '[]'$ Chest pressure   '[]'$ Palpitations   '[]'$ Shortness of breath when laying flat   '[]'$ Shortness of breath at rest   '[]'$ Shortness of breath with exertion. Vascular:  '[x]'$ Pain in legs with walking   '[]'$ Pain in legs at rest   '[]'$ Pain in legs when laying flat   '[x]'$ Claudication   '[]'$ Pain in feet when walking  '[]'$ Pain in feet at rest  '[]'$ Pain in feet when laying flat   '[]'$ History of DVT   '[]'$ Phlebitis   '[]'$ Swelling in legs   '[]'$ Varicose veins   '[]'$ Non-healing ulcers Pulmonary:   '[]'$ Uses home oxygen   '[]'$ Productive cough   '[]'$ Hemoptysis   '[]'$ Wheeze  '[]'$ COPD   '[]'$ Asthma Neurologic:  '[]'$ Dizziness  '[]'$ Blackouts   '[]'$ Seizures   '[]'$ History of stroke   '[]'$ History of TIA  '[]'$ Aphasia   '[]'$ Temporary blindness   '[]'$ Dysphagia   '[]'$ Weakness or numbness in arms   '[]'$ Weakness or numbness in legs Musculoskeletal:  '[x]'$ Arthritis   '[]'$ Joint swelling   '[]'$ Joint pain   '[x]'$ Low back pain Hematologic:  '[]'$ Easy bruising  '[]'$ Easy bleeding   '[]'$ Hypercoagulable state   '[]'$ Anemic   Gastrointestinal:  '[]'$ Blood in stool   '[]'$ Vomiting blood  '[]'$ Gastroesophageal reflux/heartburn   '[]'$ Abdominal pain Genitourinary:  '[]'$ Chronic kidney disease   '[]'$ Difficult urination  '[]'$ Frequent urination   '[]'$ Burning with urination   '[]'$ Hematuria Skin:  '[]'$ Rashes   '[]'$ Ulcers   '[]'$ Wounds Psychological:  '[]'$ History of anxiety   '[]'$  History of major depression.  Physical Examination  BP 110/69   Pulse 67   Ht '6\' 1"'$  (1.854 m)   Wt 194 lb (88 kg)   BMI 25.60 kg/m  Gen:  WD/WN, NAD Head: [place]/AT, No temporalis wasting. Ear/Nose/Throat: Hearing grossly intact, nares w/o erythema or drainage Eyes: Conjunctiva clear. Sclera  non-icteric Neck: Supple.  Trachea midline Pulmonary:  Good air movement, no use of accessory muscles.  Cardiac: RRR, no JVD Vascular:  Vessel Right Left  Radial Palpable Palpable                          PT Palpable Palpable  DP Palpable Palpable    Musculoskeletal: M/S 5/5 throughout.  No deformity or atrophy. No edema. Neurologic: Sensation grossly intact in extremities.  Symmetrical.  Speech is fluent.  Psychiatric: Judgment intact, Mood & affect appropriate for pt's clinical situation. Dermatologic: No rashes or ulcers noted.  No cellulitis or open wounds.      Labs Recent Results (from the past 2160 hour(s))  Basic metabolic panel     Status: Abnormal   Collection Time: 12/27/20  5:05 PM  Result Value Ref Range   Sodium 135 135 - 145 mmol/L   Potassium 3.8 3.5 - 5.1 mmol/L   Chloride 100 98 - 111 mmol/L   CO2 28 22 - 32 mmol/L   Glucose, Bld 120 (H) 70 - 99 mg/dL    Comment: Glucose reference range applies only to samples taken after fasting for at least 8 hours.   BUN 23 8 - 23 mg/dL   Creatinine, Ser 1.01 0.61 - 1.24 mg/dL   Calcium 9.5 8.9 - 10.3 mg/dL   GFR, Estimated >60 >60 mL/min    Comment: (NOTE) Calculated using the CKD-EPI Creatinine Equation (2021)    Anion gap 7 5 - 15    Comment: Performed at [facility], [address] [place], [place] [address]  CBC     Status: None   Collection Time: 12/27/20  5:05 PM  Result Value Ref Range   WBC 6.5 4.0 - 10.5 K/uL   RBC 4.73 4.22 - 5.81 MIL/uL   Hemoglobin 15.8 13.0 - 17.0 g/dL    HCT 45.8 39.0 - 52.0 %   MCV 96.8 80.0 - 100.0 fL   MCH 33.4 26.0 - 34.0 pg   MCHC 34.5 30.0 - 36.0 g/dL   RDW 13.3 11.5 - 15.5 %   Platelets 200 150 - 400 K/uL   nRBC 0.0 0.0 - 0.2 %    Comment: Performed at [facility], [place], [place] [address]  Troponin I (High Sensitivity)     Status: None   Collection Time: 12/27/20  5:05 PM  Result Value Ref Range   Troponin I (High Sensitivity) 4 <18 ng/L    Comment: (NOTE) Elevated high sensitivity troponin I (hsTnI) values and significant  changes across serial measurements may suggest ACS but many other  chronic and acute conditions are known to elevate hsTnI results.  Refer to the "Links" section for chest pain algorithms and additional  guidance. Performed at [facility], [place], [place] [address]   Troponin I (High Sensitivity)     Status: None   Collection Time: 12/27/20 10:15 PM  Result Value Ref Range   Troponin I (High Sensitivity) 4 <18 ng/L    Comment: (NOTE) Elevated high sensitivity troponin I (hsTnI) values and significant  changes across serial measurements may suggest ACS but many other  chronic and acute conditions are known to elevate hsTnI results.  Refer to the "Links" section for chest pain algorithms and additional  guidance. Performed at [facility], [address] [place], [place] [address]   Resp Panel by RT-PCR (Flu A&B, Covid) Nasopharyngeal Swab  Status: None   Collection Time: 12/27/20 11:02 PM   Specimen: Nasopharyngeal Swab; Nasopharyngeal(NP) swabs in vial transport medium  Result Value Ref Range   SARS Coronavirus 2 by RT PCR NEGATIVE NEGATIVE    Comment: (NOTE) SARS-CoV-2 target nucleic acids are NOT DETECTED.  The SARS-CoV-2 RNA is generally detectable in upper respiratory specimens during the acute phase of infection. The lowest concentration of SARS-CoV-2 viral copies this assay can detect is 138 copies/mL. A negative  result does not preclude SARS-Cov-2 infection and should not be used as the sole basis for treatment or other patient management decisions. A negative result may occur with  improper specimen collection/handling, submission of specimen other than nasopharyngeal swab, presence of viral mutation(s) within the areas targeted by this assay, and inadequate number of viral copies(<138 copies/mL). A negative result must be combined with clinical observations, patient history, and epidemiological information. The expected result is Negative.  Fact Sheet for Patients:  EntrepreneurPulse.com.au  Fact Sheet for Healthcare Providers:  IncredibleEmployment.be  This test is no t yet approved or cleared by the [place] FDA and  has been authorized for detection and/or diagnosis of SARS-CoV-2 by FDA under an Emergency Use Authorization (EUA). This EUA will remain  in effect (meaning this test can be used) for the duration of the COVID-19 declaration under Section 564(b)(1) of the Act, 21 U.S.C.section 360bbb-3(b)(1), unless the authorization is terminated  or revoked sooner.       Influenza A by PCR NEGATIVE NEGATIVE   Influenza B by PCR NEGATIVE NEGATIVE    Comment: (NOTE) The Xpert Xpress SARS-CoV-2/FLU/RSV plus assay is intended as an aid in the diagnosis of influenza from Nasopharyngeal swab specimens and should not be used as a sole basis for treatment. Nasal washings and aspirates are unacceptable for Xpert Xpress SARS-CoV-2/FLU/RSV testing.  Fact Sheet for Patients: EntrepreneurPulse.com.au  Fact Sheet for Healthcare Providers: IncredibleEmployment.be  This test is not yet approved or cleared by the [place] FDA and has been authorized for detection and/or diagnosis of SARS-CoV-2 by FDA under an Emergency Use Authorization (EUA). This EUA will remain in effect (meaning this test can be used) for the  duration of the COVID-19 declaration under Section 564(b)(1) of the Act, 21 U.S.C. section 360bbb-3(b)(1), unless the authorization is terminated or revoked.  Performed at [facility], [address] [place], [place] [address]   APTT     Status: None   Collection Time: 12/28/20 12:50 AM  Result Value Ref Range   aPTT 29 24 - 36 seconds    Comment: Performed at [facility], [address] [place], [place] [address]  Protime-INR     Status: None   Collection Time: 12/28/20 12:50 AM  Result Value Ref Range   Prothrombin Time 13.8 11.4 - 15.2 seconds   INR 1.1 0.8 - 1.2    Comment: (NOTE) INR goal varies based on device and disease states. Performed at [facility], [address] [place], [place] [id number]   Basic metabolic panel     Status: Abnormal   Collection Time: 12/28/20  3:54 AM  Result Value Ref Range   Sodium 135 135 - 145 mmol/L   Potassium 4.5 3.5 - 5.1 mmol/L   Chloride 101 98 - 111 mmol/L   CO2 28 22 - 32 mmol/L   Glucose, Bld 157 (H) 70 - 99 mg/dL    Comment: Glucose reference range applies only to samples taken after fasting for at least 8 hours.  BUN 21 8 - 23 mg/dL   Creatinine, Ser 0.95 0.61 - 1.24 mg/dL   Calcium 9.3 8.9 - 10.3 mg/dL   GFR, Estimated >60 >60 mL/min    Comment: (NOTE) Calculated using the CKD-EPI Creatinine Equation (2021)    Anion gap 6 5 - 15    Comment: Performed at [facility], [place], [place] [address]  Heparin level (unfractionated)     Status: None   Collection Time: 12/28/20  8:26 AM  Result Value Ref Range   Heparin Unfractionated 0.47 0.30 - 0.70 IU/mL    Comment: (NOTE) The clinical reportable range upper limit is being lowered to >1.10 to align with the FDA approved guidance for the current laboratory assay.  If heparin results are below expected values, and patient dosage has  been confirmed, suggest follow up testing of antithrombin III levels. Performed  at [facility], [place], [place] [address]   Heparin level (unfractionated)     Status: None   Collection Time: 12/28/20  4:29 PM  Result Value Ref Range   Heparin Unfractionated 0.44 0.30 - 0.70 IU/mL    Comment: (NOTE) The clinical reportable range upper limit is being lowered to >1.10 to align with the FDA approved guidance for the current laboratory assay.  If heparin results are below expected values, and patient dosage has  been confirmed, suggest follow up testing of antithrombin III levels. Performed at [facility], [address] [place], [place] [address]   CBC     Status: None   Collection Time: 12/29/20  5:04 AM  Result Value Ref Range   WBC 6.6 4.0 - 10.5 K/uL   RBC 4.95 4.22 - 5.81 MIL/uL   Hemoglobin 16.6 13.0 - 17.0 g/dL   HCT 47.9 39.0 - 52.0 %   MCV 96.8 80.0 - 100.0 fL   MCH 33.5 26.0 - 34.0 pg   MCHC 34.7 30.0 - 36.0 g/dL   RDW 13.3 11.5 - 15.5 %   Platelets 181 150 - 400 K/uL   nRBC 0.0 0.0 - 0.2 %    Comment: Performed at [facility], [place], [place] [address]  Heparin level (unfractionated)     Status: Abnormal   Collection Time: 12/29/20  5:04 AM  Result Value Ref Range   Heparin Unfractionated 0.76 (H) 0.30 - 0.70 IU/mL    Comment: (NOTE) The clinical reportable range upper limit is being lowered to >1.10 to align with the FDA approved guidance for the current laboratory assay.  If heparin results are below expected values, and patient dosage has  been confirmed, suggest follow up testing of antithrombin III levels. Performed at [facility], [address], [place],  [address]     Radiology No results found.  Assessment/Plan  Peripheral vascular disease (HCC) ABIs today are slightly better at 0.92 on the right and 1.01 on the left with digit pressures of 95 bilaterally and biphasic waveforms.  Doing well.  Still smoking and understands importance of  quitting.  Continue current medical regimen.  Recheck in 6 months and then if things are stable we can probably go to once a year.  Essential (primary) hypertension blood pressure control important in reducing the progression of atherosclerotic disease. On appropriate oral medications.   Mixed hyperlipidemia lipid control important in reducing the progression of atherosclerotic disease. Continue statin therapy   Tobacco use disorder We had a discussion for approximately 3-4 minutes regarding the absolute  need for smoking cessation due to the deleterious nature of tobacco on the vascular system. We discussed the tobacco use would diminish patency of any intervention, and likely significantly worsen progressio of disease. We discussed multiple agents for quitting including replacement therapy or medications to reduce cravings such as Chantix. The patient voices their understanding of the importance of smoking cessation.  He has cut back and is working on stopping entirely.    [name], MD  02/03/2021 11:23 AM    This note was created with Dragon medical transcription system.  Any errors from dictation are purely unintentional

## 2021-02-27 DIAGNOSIS — J449 Chronic obstructive pulmonary disease, unspecified: Secondary | ICD-10-CM | POA: Diagnosis not present

## 2021-04-05 ENCOUNTER — Encounter: Payer: Self-pay | Admitting: Internal Medicine

## 2021-04-05 ENCOUNTER — Other Ambulatory Visit: Payer: Self-pay

## 2021-04-05 ENCOUNTER — Ambulatory Visit (INDEPENDENT_AMBULATORY_CARE_PROVIDER_SITE_OTHER): Payer: PPO | Admitting: Internal Medicine

## 2021-04-05 VITALS — BP 132/70 | HR 57 | Ht 73.0 in | Wt 197.0 lb

## 2021-04-05 DIAGNOSIS — M109 Gout, unspecified: Secondary | ICD-10-CM | POA: Diagnosis not present

## 2021-04-05 DIAGNOSIS — I25119 Atherosclerotic heart disease of native coronary artery with unspecified angina pectoris: Secondary | ICD-10-CM

## 2021-04-05 DIAGNOSIS — R7303 Prediabetes: Secondary | ICD-10-CM

## 2021-04-05 DIAGNOSIS — Z125 Encounter for screening for malignant neoplasm of prostate: Secondary | ICD-10-CM

## 2021-04-05 DIAGNOSIS — Z23 Encounter for immunization: Secondary | ICD-10-CM

## 2021-04-05 DIAGNOSIS — I10 Essential (primary) hypertension: Secondary | ICD-10-CM | POA: Diagnosis not present

## 2021-04-05 DIAGNOSIS — F17200 Nicotine dependence, unspecified, uncomplicated: Secondary | ICD-10-CM | POA: Diagnosis not present

## 2021-04-05 DIAGNOSIS — Z0000 Encounter for general adult medical examination without abnormal findings: Secondary | ICD-10-CM

## 2021-04-05 LAB — POCT URINALYSIS DIPSTICK
Bilirubin, UA: NEGATIVE
Glucose, UA: NEGATIVE
Ketones, UA: NEGATIVE
Leukocytes, UA: NEGATIVE
Nitrite, UA: NEGATIVE
Protein, UA: NEGATIVE
Spec Grav, UA: 1.02 (ref 1.010–1.025)
Urobilinogen, UA: 0.2 E.U./dL
pH, UA: 6 (ref 5.0–8.0)

## 2021-04-05 MED ORDER — INDOMETHACIN 25 MG PO CAPS: 25.0000 mg | ORAL_CAPSULE | Freq: Two times a day (BID) | ORAL | 1 refills | Status: DC | PRN

## 2021-04-05 NOTE — Progress Notes (Signed)
Date:  04/05/2021   Name:  Aaron [name]   DOB:  [birth year]   MRN:  [id number]   Chief Complaint: Annual Exam Aaron [name] is a 65 y.o. male who presents today for his Complete Annual Exam. He feels well. He reports exercising - walking dog 3 times daily. He reports he is sleeping fairly well.   Colonoscopy: 01/2020  Immunization History  Administered Date(s) Administered   Influenza,inj,Quad PF,6+ Mos 05/19/2015, 04/06/2016, 04/02/2017, 03/24/2018, 04/01/2019, 04/14/2020   PFIZER(Purple Top)SARS-COV-2 Vaccination 08/25/2019, 09/15/2019, 06/13/2020   Pneumococcal Polysaccharide-23 11/14/2012, 07/03/2014   Tdap 12/18/2013    Hypertension This is a chronic problem. The problem is controlled. Pertinent negatives include no chest pain, headaches, palpitations or shortness of breath. Past treatments include beta blockers and ACE inhibitors. Hypertensive end-organ damage includes CAD/MI. There is no history of kidney disease or CVA.  Hyperlipidemia This is a chronic problem. The problem is controlled. Pertinent negatives include no chest pain, myalgias or shortness of breath. Current antihyperlipidemic treatment includes statins. The current treatment provides significant improvement of lipids.  Diabetes He presents for his follow-up diabetic visit. Diabetes type: prediabetes. Pertinent negatives for hypoglycemia include no dizziness, headaches or nervousness/anxiousness. Pertinent negatives for diabetes include no chest pain and no fatigue. Pertinent negatives for diabetic complications include no CVA.   Lab Results  Component Value Date   CREATININE 0.95 12/28/2020   BUN 21 12/28/2020   NA 135 12/28/2020   K 4.5 12/28/2020   CL 101 12/28/2020   CO2 28 12/28/2020   Lab Results  Component Value Date   CHOL 146 11/05/2019   HDL 38 (L) 11/05/2019   LDLCALC 82 11/05/2019   TRIG 149 11/05/2019   CHOLHDL 3.8 11/05/2019   Lab Results  Component Value Date   TSH 3.214  02/06/2019   Lab Results  Component Value Date   HGBA1C 5.9 (H) 05/16/2020   Lab Results  Component Value Date   WBC 6.6 12/29/2020   HGB 16.6 12/29/2020   HCT 47.9 12/29/2020   MCV 96.8 12/29/2020   PLT 181 12/29/2020   Lab Results  Component Value Date   ALT 40 11/09/2019   AST 31 11/09/2019   ALKPHOS 52 11/09/2019   BILITOT 0.8 11/09/2019   Lab Results  Component Value Date   PSA1 2.5 11/05/2019   PSA1 1.5 10/28/2018   PSA1 1.3 10/23/2017   PSA 0.98 05/19/2015     Review of Systems  Constitutional:  Negative for appetite change, chills, diaphoresis, fatigue and unexpected weight change.  HENT:  Negative for hearing loss, tinnitus, trouble swallowing and voice change.   Eyes:  Negative for visual disturbance.  Respiratory:  Negative for choking, shortness of breath and wheezing.   Cardiovascular:  Negative for chest pain, palpitations and leg swelling.  Gastrointestinal:  Negative for abdominal pain, blood in stool, constipation and diarrhea.  Genitourinary:  Negative for difficulty urinating, dysuria and frequency.  Musculoskeletal:  Negative for arthralgias, back pain and myalgias.  Skin:  Negative for color change and rash.  Neurological:  Negative for dizziness, syncope and headaches.  Hematological:  Negative for adenopathy.  Psychiatric/Behavioral:  Positive for sleep disturbance. Negative for dysphoric mood. The patient is not nervous/anxious.    Patient Active Problem List   Diagnosis Date Noted   Unstable angina ([place]) 12/27/2020   Aortic atherosclerosis ([place]) 07/10/2020   Stage 2 moderate COPD by GOLD classification ([place]) 05/16/2020   Dyspepsia and disorder of function of stomach 05/16/2020  Cervical disc disorder of mid-cervical region 05/10/2020   Polyp of descending colon    Mixed hyperlipidemia 10/28/2018   Tobacco use disorder 07/13/2016   Prediabetes 07/27/2015   Controlled gout 12/22/2014   Coronary artery disease involving native coronary  artery of native heart with angina pectoris ([place]) 10/09/2014   Essential (primary) hypertension 10/09/2014   Dupuytren's disease of palm 10/09/2014   Psoriasis 10/09/2014   H/O cardiac catheterization 01/27/2014   Peripheral vascular disease ([place]) 01/27/2014    Allergies  Allergen Reactions   Contrast Media [Iodinated Diagnostic Agents] Hives   Iodine Hives    Past Surgical History:  Procedure Laterality Date   APPENDECTOMY     cardiac stents   2012   COLONOSCOPY  03/30/2013   [place]   COLONOSCOPY WITH PROPOFOL N/A 12/10/2019   Procedure: COLONOSCOPY WITH PROPOFOL;  Surgeon: [name], MD;  Location: [facility];  Service: Endoscopy;  Laterality: N/A;   COLONOSCOPY WITH PROPOFOL N/A 01/18/2020   Procedure: COLONOSCOPY WITH PROPOFOL;  Surgeon: [name], MD;  Location: [facility];  Service: Endoscopy;  Laterality: N/A;  4   LEFT HEART CATH AND CORONARY ANGIOGRAPHY N/A 12/29/2020   Procedure: LEFT HEART CATH AND CORONARY ANGIOGRAPHY;  Surgeon: Corey Skains, MD;  Location: [facility];  Service: Cardiovascular;  Laterality: N/A;   LOWER EXTREMITY ANGIOGRAPHY Right 09/25/2018   Procedure: LOWER EXTREMITY ANGIOGRAPHY;  Surgeon: Algernon Huxley, MD;  Location: [facility];  Service: Cardiovascular;  Laterality: Right;   LOWER EXTREMITY ANGIOGRAPHY Left 03/17/2020   Procedure: LOWER EXTREMITY ANGIOGRAPHY;  Surgeon: Algernon Huxley, MD;  Location: [facility];  Service: Cardiovascular;  Laterality: Left;   POLYPECTOMY N/A 01/18/2020   Procedure: POLYPECTOMY;  Surgeon: [name], MD;  Location: [facility];  Service: Endoscopy;  Laterality: N/A;    Social History   Tobacco Use   Smoking status: Every Day    Packs/day: 1.00    Years: 46.00    Pack years: 46.00    Types: Cigarettes   Smokeless tobacco: Never  Vaping Use   Vaping Use: Never used  Substance Use Topics   Alcohol use: Not Currently    Comment: quit drinking around  04/2019(May have drink 1x/mo)   Drug use: No     Medication list has been reviewed and updated.  Current Meds  Medication Sig   albuterol (VENTOLIN HFA) 108 (90 Base) MCG/ACT inhaler Inhale 2 puffs into the lungs every 6 (six) hours as needed for wheezing or shortness of breath.   aspirin EC 81 MG tablet Take 1 tablet (81 mg total) by mouth daily. (Patient taking differently: Take 325 mg by mouth daily.)   cilostazol (PLETAL) 50 MG tablet Take by mouth 2 (two) times daily.    clopidogrel (PLAVIX) 75 MG tablet Take 1 tablet by mouth daily.   indomethacin (INDOCIN) 25 MG capsule Take 1 capsule (25 mg total) by mouth 2 (two) times daily as needed.   isosorbide mononitrate (IMDUR) 120 MG 24 hr tablet Take 120 mg by mouth daily.   lisinopril (PRINIVIL,ZESTRIL) 10 MG tablet Take 1 tablet by mouth daily.   methocarbamol (ROBAXIN) 500 MG tablet Take 1 tablet (500 mg total) by mouth at bedtime as needed for muscle spasms.   metoprolol tartrate (LOPRESSOR) 50 MG tablet Take 50-75 mg by mouth 2 (two) times a day. Take 50MG  by mouth every morning and 75MG  every evening   nitroGLYCERIN (NITROSTAT) 0.4 MG SL tablet Place 1  tablet under the tongue as needed.   pantoprazole (PROTONIX) 40 MG tablet Take 1 tablet (40 mg total) by mouth daily.   simvastatin (ZOCOR) 40 MG tablet Take 1 tablet (40 mg total) by mouth at bedtime.    PHQ 2/9 Scores 04/05/2021 01/04/2021 09/16/2020 07/12/2020  PHQ - 2 Score 0 0 0 0  PHQ- 9 Score 1 1 0 0    GAD 7 : Generalized Anxiety Score 04/05/2021 01/04/2021 09/16/2020 07/12/2020  Nervous, Anxious, on Edge 0 0 0 0  Control/stop worrying 0 0 0 0  Worry too much - different things 0 0 0 0  Trouble relaxing 0 0 0 0  Restless 0 0 0 0  Easily annoyed or irritable 0 0 0 0  Afraid - awful might happen 0 0 0 0  Total GAD 7 Score 0 0 0 0  Anxiety Difficulty Not difficult at all Not difficult at all - -    BP Readings from Last 3 Encounters:  04/05/21 132/70  02/03/21 110/69   01/04/21 110/72    Physical Exam Vitals and nursing note reviewed.  Constitutional:      Appearance: Normal appearance. He is well-developed.  HENT:     Head: Normocephalic.     Right Ear: Tympanic membrane, ear canal and external ear normal.     Left Ear: Tympanic membrane, ear canal and external ear normal.     Nose: Nose normal.  Eyes:     Conjunctiva/sclera: Conjunctivae normal.     Pupils: Pupils are equal, round, and reactive to light.  Neck:     Thyroid: No thyromegaly.     Vascular: No carotid bruit.  Cardiovascular:     Rate and Rhythm: Normal rate and regular rhythm.     Heart sounds: Normal heart sounds.  Pulmonary:     Effort: Pulmonary effort is normal.     Breath sounds: Normal breath sounds. No wheezing.  Chest:  Breasts:    Right: No mass.     Left: No mass.  Abdominal:     General: Bowel sounds are normal.     Palpations: Abdomen is soft.     Tenderness: There is no abdominal tenderness.  Musculoskeletal:        General: Normal range of motion.     Cervical back: Normal range of motion and neck supple.  Lymphadenopathy:     Cervical: No cervical adenopathy.  Skin:    General: Skin is warm and dry.  Neurological:     Mental Status: He is alert and oriented to person, place, and time.     Deep Tendon Reflexes: Reflexes are normal and symmetric.  Psychiatric:        Attention and Perception: Attention normal.        Mood and Affect: Mood normal.        Thought Content: Thought content normal.    Wt Readings from Last 3 Encounters:  04/05/21 197 lb (89.4 kg)  02/03/21 194 lb (88 kg)  01/04/21 193 lb (87.5 kg)    BP 132/70   Pulse (!) 57   Ht 6\' 1"  (1.854 m)   Wt 197 lb (89.4 kg)   SpO2 98%   BMI 25.99 kg/m   Assessment and Plan: 1. Annual physical exam Normal exam Screenings are up to date Urine dip + for blood but micro negative  2. Essential (primary) hypertension Clinically stable exam with well controlled BP. Tolerating  medications without side effects at this time. Pt to continue current  regimen and low sodium diet; benefits of regular exercise as able discussed. - Comprehensive metabolic panel  3. Prediabetes Work on limiting carbs - will advise on A1C - Hemoglobin A1c  4. Coronary artery disease involving native coronary artery of native heart with angina pectoris ([place]) Followed by Cardiology On appropriate statin, plavix and aspirin No angina, walking the dog 45 minutes + per day - Lipid panel  5. Prostate cancer screening DRE deferred - PSA  6. Need for vaccination for pneumococcus - Pneumococcal conjugate vaccine 20-valent  7. Controlled gout Continue to control with diet Use indocin PRN - indomethacin (INDOCIN) 25 MG capsule; Take 1 capsule (25 mg total) by mouth 2 (two) times daily as needed.  Dispense: 30 capsule; Refill: 1  8. Tobacco use disorder Still smoking but trying to cut back Participating in the lung cancer screening program - due for scan in December   Partially dictated using [employer]. Any errors are unintentional.  Halina Maidens, MD [facility]  04/05/2021

## 2021-04-06 LAB — COMPREHENSIVE METABOLIC PANEL
ALT: 44 IU/L (ref 0–44)
AST: 31 IU/L (ref 0–40)
Albumin/Globulin Ratio: 1.8 (ref 1.2–2.2)
Albumin: 4.9 g/dL — ABNORMAL HIGH (ref 3.8–4.8)
Alkaline Phosphatase: 77 IU/L (ref 44–121)
BUN/Creatinine Ratio: 17 (ref 10–24)
BUN: 15 mg/dL (ref 8–27)
Bilirubin Total: 0.6 mg/dL (ref 0.0–1.2)
CO2: 22 mmol/L (ref 20–29)
Calcium: 9.8 mg/dL (ref 8.6–10.2)
Chloride: 95 mmol/L — ABNORMAL LOW (ref 96–106)
Creatinine, Ser: 0.86 mg/dL (ref 0.76–1.27)
Globulin, Total: 2.7 g/dL (ref 1.5–4.5)
Glucose: 102 mg/dL — ABNORMAL HIGH (ref 70–99)
Potassium: 4.5 mmol/L (ref 3.5–5.2)
Sodium: 134 mmol/L (ref 134–144)
Total Protein: 7.6 g/dL (ref 6.0–8.5)
eGFR: 96 mL/min/{1.73_m2} (ref >59)

## 2021-04-06 LAB — LIPID PANEL
Chol/HDL Ratio: 3.9 ratio (ref 0.0–5.0)
Cholesterol, Total: 140 mg/dL (ref 100–199)
HDL: 36 mg/dL — ABNORMAL LOW (ref >39)
LDL Chol Calc (NIH): 83 mg/dL (ref 0–99)
Triglycerides: 114 mg/dL (ref 0–149)
VLDL Cholesterol Cal: 21 mg/dL (ref 5–40)

## 2021-04-06 LAB — HEMOGLOBIN A1C
Est. average glucose Bld gHb Est-mCnc: 120 mg/dL
Hgb A1c MFr Bld: 5.8 % — ABNORMAL HIGH (ref 4.8–5.6)

## 2021-04-06 LAB — PSA: Prostate Specific Ag, Serum: 1.7 ng/mL (ref 0.0–4.0)

## 2021-04-18 DIAGNOSIS — E785 Hyperlipidemia, unspecified: Secondary | ICD-10-CM | POA: Diagnosis not present

## 2021-04-18 DIAGNOSIS — I2510 Atherosclerotic heart disease of native coronary artery without angina pectoris: Secondary | ICD-10-CM | POA: Diagnosis not present

## 2021-04-18 DIAGNOSIS — J449 Chronic obstructive pulmonary disease, unspecified: Secondary | ICD-10-CM | POA: Diagnosis not present

## 2021-04-18 DIAGNOSIS — I25118 Atherosclerotic heart disease of native coronary artery with other forms of angina pectoris: Secondary | ICD-10-CM | POA: Diagnosis not present

## 2021-04-18 DIAGNOSIS — I739 Peripheral vascular disease, unspecified: Secondary | ICD-10-CM | POA: Diagnosis not present

## 2021-04-18 DIAGNOSIS — I10 Essential (primary) hypertension: Secondary | ICD-10-CM | POA: Diagnosis not present

## 2021-04-18 DIAGNOSIS — Z9889 Other specified postprocedural states: Secondary | ICD-10-CM | POA: Diagnosis not present

## 2021-05-01 ENCOUNTER — Ambulatory Visit: Payer: PPO

## 2021-05-24 ENCOUNTER — Ambulatory Visit (INDEPENDENT_AMBULATORY_CARE_PROVIDER_SITE_OTHER): Payer: PPO

## 2021-05-24 DIAGNOSIS — Z0000 Encounter for general adult medical examination without abnormal findings: Secondary | ICD-10-CM

## 2021-05-24 DIAGNOSIS — Z122 Encounter for screening for malignant neoplasm of respiratory organs: Secondary | ICD-10-CM

## 2021-05-24 NOTE — Progress Notes (Signed)
Subjective:   Aaron [name] is a 65 y.o. male who presents for Medicare Annual/Subsequent preventive examination.  Virtual Visit via Telephone Note  I connected with  Aaron [name] on 05/24/21 at  3:20 PM EST by telephone and verified that I am speaking with the correct person using two identifiers.  Location: Patient: home Provider: [place] [name] Persons participating in the virtual visit: Aaron [name]   I discussed the limitations, risks, security and privacy concerns of performing an evaluation and management service by telephone and the availability of in person appointments. The patient expressed understanding and agreed to proceed.  Interactive audio and video telecommunications were attempted between this nurse and patient, however failed, due to patient having technical difficulties OR patient did not have access to video capability.  We continued and completed visit with audio only.  Some vital signs may be absent or patient reported.   Aaron Marker, LPN   Review of Systems     Cardiac Risk Factors include: advanced age (>12men, >57 women);dyslipidemia;male gender;hypertension;smoking/ tobacco exposure     Objective:    There were no vitals filed for this visit. There is no height or weight on file to calculate BMI.  Advanced Directives 05/24/2021 12/28/2020 12/27/2020 04/27/2020 03/17/2020 01/18/2020 12/10/2019  Does Patient Have a Medical Advance Directive? No No No No No Yes No  Type of Advance Directive - - - - - Press photographer -  Does patient want to make changes to medical advance directive? - - - - - No - Patient declined -  Copy of [place] in Chart? - - - - - No - copy requested -  Would patient like information on creating a medical advance directive? Yes (MAU/Ambulatory/Procedural Areas - Information given) No - Patient declined - No - Patient declined - - Yes (MAU/Ambulatory/Procedural Areas - Information given)     Current Medications (verified) Outpatient Encounter Medications as of 05/24/2021  Medication Sig   albuterol (VENTOLIN HFA) 108 (90 Base) MCG/ACT inhaler Inhale 2 puffs into the lungs every 6 (six) hours as needed for wheezing or shortness of breath.   aspirin EC 81 MG tablet Take 1 tablet (81 mg total) by mouth daily.   cilostazol (PLETAL) 50 MG tablet Take by mouth 2 (two) times daily.    clopidogrel (PLAVIX) 75 MG tablet Take 1 tablet by mouth daily.   indomethacin (INDOCIN) 25 MG capsule Take 1 capsule (25 mg total) by mouth 2 (two) times daily as needed.   isosorbide mononitrate (IMDUR) 120 MG 24 hr tablet Take 120 mg by mouth daily.   lisinopril (PRINIVIL,ZESTRIL) 10 MG tablet Take 1 tablet by mouth daily.   metoprolol tartrate (LOPRESSOR) 50 MG tablet Take 50-75 mg by mouth 2 (two) times a day. Take 50MG  by mouth every morning and 75MG  every evening   nitroGLYCERIN (NITROSTAT) 0.4 MG SL tablet Place 1 tablet under the tongue as needed.   simvastatin (ZOCOR) 40 MG tablet Take 1 tablet (40 mg total) by mouth at bedtime.   [DISCONTINUED] methocarbamol (ROBAXIN) 500 MG tablet Take 1 tablet (500 mg total) by mouth at bedtime as needed for muscle spasms.   [DISCONTINUED] pantoprazole (PROTONIX) 40 MG tablet Take 1 tablet (40 mg total) by mouth daily.   No facility-administered encounter medications on file as of 05/24/2021.    Allergies (verified) Contrast media [iodinated diagnostic agents] and Iodine   History: Past Medical History:  Diagnosis Date   COPD (chronic obstructive pulmonary disease) ([place])  Coronary artery disease    s/p PCI   Fracture of left ankle, closed, initial encounter 10/23/2017   GERD (gastroesophageal reflux disease)    Glaucoma    Gout    Hx of heart artery stent 01/12/2017   Hypercholesteremia    Hypertension    PAD (peripheral artery disease) ([place])    Pain in limb 07/13/2016   Syncope 09/16/2017   Unstable angina ([place]) 01/12/2017   Vertigo    Past  Surgical History:  Procedure Laterality Date   APPENDECTOMY     cardiac stents   2012   COLONOSCOPY  03/30/2013   [place]   COLONOSCOPY WITH PROPOFOL N/A 12/10/2019   Procedure: COLONOSCOPY WITH PROPOFOL;  Surgeon: [name], MD;  Location: [facility];  Service: Endoscopy;  Laterality: N/A;   COLONOSCOPY WITH PROPOFOL N/A 01/18/2020   Procedure: COLONOSCOPY WITH PROPOFOL;  Surgeon: [name], MD;  Location: [facility];  Service: Endoscopy;  Laterality: N/A;  4   LEFT HEART CATH AND CORONARY ANGIOGRAPHY N/A 12/29/2020   Procedure: LEFT HEART CATH AND CORONARY ANGIOGRAPHY;  Surgeon: Corey Skains, MD;  Location: [facility];  Service: Cardiovascular;  Laterality: N/A;   LOWER EXTREMITY ANGIOGRAPHY Right 09/25/2018   Procedure: LOWER EXTREMITY ANGIOGRAPHY;  Surgeon: Algernon Huxley, MD;  Location: [facility];  Service: Cardiovascular;  Laterality: Right;   LOWER EXTREMITY ANGIOGRAPHY Left 03/17/2020   Procedure: LOWER EXTREMITY ANGIOGRAPHY;  Surgeon: Algernon Huxley, MD;  Location: [facility];  Service: Cardiovascular;  Laterality: Left;   POLYPECTOMY N/A 01/18/2020   Procedure: POLYPECTOMY;  Surgeon: [name], MD;  Location: [facility];  Service: Endoscopy;  Laterality: N/A;   Family History  Problem Relation Age of Onset   Hypertension Mother    Diabetes Mother    Heart failure Father        age 47   Cancer Father    Ovarian cancer Sister    Heart disease Brother    Social History   Socioeconomic History   Marital status: Single    Spouse name: Not on file   Number of children: 0   Years of education: Not on file   Highest education level: 12th grade  Occupational History   Occupation: Retired/Disabled  Tobacco Use   Smoking status: Every Day    Packs/day: 1.00    Years: 46.00    Pack years: 46.00    Types: Cigarettes   Smokeless tobacco: Never   Tobacco comments:    Pt down to 1/2 PPD 05/24/21  Vaping Use    Vaping Use: Never used  Substance and Sexual Activity   Alcohol use: Not Currently    Comment: quit drinking around 04/2019(May have drink 1x/mo)   Drug use: No   Sexual activity: Not Currently    Birth control/protection: None  Other Topics Concern   Not on file  Social History Narrative   Independent at baseline. Lives alone. Does not drive.    Social Determinants of Health   Financial Resource Strain: Low Risk    Difficulty of Paying Living Expenses: Not hard at all  Food Insecurity: No Food Insecurity   Worried About Charity fundraiser in the Last Year: Never true   [place] in the Last Year: Never true  Transportation Needs: No Transportation Needs   Lack of Transportation (Medical): No   Lack of Transportation (Non-Medical): No  Physical Activity: Sufficiently Active   Days of Exercise per  Week: 7 days   Minutes of Exercise per Session: 90 min  Stress: No Stress Concern Present   Feeling of Stress : Not at all  Social Connections: Socially Isolated   Frequency of Communication with Friends and Family: More than three times a week   Frequency of Social Gatherings with Friends and Family: More than three times a week   Attends Religious Services: Never   Marine scientist or Organizations: No   Attends Music therapist: Never   Marital Status: Never married    Tobacco Counseling Ready to quit: Yes Counseling given: Yes Tobacco comments: Pt down to 1/2 PPD 05/24/21   Clinical Intake:  Pre-visit preparation completed: Yes  Pain : No/denies pain     Nutritional Risks: None Diabetes: No  How often do you need to have someone help you when you read instructions, pamphlets, or other written materials from your doctor or pharmacy?: 1 - Never    Interpreter Needed?: No  Information entered by :: Aaron Marker LPN   Activities of Daily Living In your present state of health, do you have any difficulty performing the following  activities: 05/24/2021 04/05/2021  Hearing? N N  Vision? N N  Difficulty concentrating or making decisions? N N  Walking or climbing stairs? N N  Dressing or bathing? N N  Doing errands, shopping? N N  Preparing Food and eating ? N -  Using the Toilet? N -  In the past six months, have you accidently leaked urine? N -  Do you have problems with loss of bowel control? N -  Managing your Medications? N -  Managing your Finances? N -  Housekeeping or managing your Housekeeping? N -  Some recent data might be hidden    Patient Care Team: Glean Hess, MD as PCP - General (Internal Medicine) Isaias Cowman, MD as PCP - Cardiology (Cardiology) Hessie Knows, MD as Consulting Physician (Orthopedic Surgery) Lucky Cowboy Erskine Squibb, MD as Referring Physician (Vascular Surgery) Ottie Glazier, MD as Consulting Physician (Pulmonary Disease) Minor, Dalbert Garnet, RN (Inactive) as [place] any recent [place] you may have received from other than [facility] providers in the past year (date may be approximate).     Assessment:   This is a routine wellness examination for [place].  Hearing/Vision screen Hearing Screening - Comments:: Pt denies hearing difficulty Vision Screening - Comments:: Annual vision screenings done at [facility] Dr. Edison Pace  Dietary issues and exercise activities discussed: Current Exercise Habits: Home exercise routine, Type of exercise: walking, Time (Minutes): > 60, Frequency (Times/Week): 7, Weekly Exercise (Minutes/Week): 0, Intensity: Mild, Exercise limited by: None identified   Goals Addressed             This Visit's Progress    DIET - INCREASE WATER INTAKE   On track    Recommend to drink at least 6-8 8oz glasses of water per day.       Depression Screen PHQ 2/9 Scores 05/24/2021 04/05/2021 01/04/2021 09/16/2020 07/12/2020 05/16/2020 05/10/2020  PHQ - 2 Score 0 0 0 0 0 0 0  PHQ- 9 Score - 1 1 0 0 0 0    Fall  Risk Fall Risk  05/24/2021 04/05/2021 01/04/2021 09/16/2020 07/12/2020  Falls in the past year? 0 0 0 0 0  Number falls in past yr: 0 0 - - -  Injury with Fall? 0 0 - - -  Comment - - - - -  Risk for fall due to : No Fall Risks No Fall Risks - - -  Follow up Falls prevention discussed Falls evaluation completed Falls evaluation completed Falls evaluation completed Falls evaluation completed    [place]:  Any stairs in or around the home? No  If so, are there any without handrails? No  Home free of loose throw rugs in walkways, pet beds, electrical cords, etc? Yes  Adequate lighting in your home to reduce risk of falls? Yes   ASSISTIVE DEVICES UTILIZED TO PREVENT FALLS:  Life alert? No  Use of a cane, walker or w/c? No  Grab bars in the bathroom? No  Shower chair or bench in shower? No  Elevated toilet seat or a handicapped toilet? No   TIMED UP AND GO:  Was the test performed? No . Telephonic visit.   Cognitive Function: Normal cognitive status assessed by direct observation by this Nurse Health Advisor. No abnormalities found.       6CIT Screen 04/27/2020 04/27/2019 10/17/2017  What Year? 0 points 0 points 0 points  What month? 0 points 0 points 0 points  What time? 0 points 0 points 0 points  Count back from 20 0 points 0 points 0 points  Months in reverse 0 points 0 points 0 points  Repeat phrase 0 points 0 points 2 points  Total Score 0 0 2    Immunizations Immunization History  Administered Date(s) Administered   Fluad Quad(high Dose 65+) 04/05/2021   Influenza,inj,Quad PF,6+ Mos 05/19/2015, 04/06/2016, 04/02/2017, 03/24/2018, 04/01/2019, 04/14/2020   PFIZER(Purple Top)SARS-COV-2 Vaccination 08/25/2019, 09/15/2019, 06/13/2020   PNEUMOCOCCAL CONJUGATE-20 04/05/2021   Pneumococcal Polysaccharide-23 11/14/2012, 07/03/2014   Tdap 12/18/2013    TDAP status: Up to date  Flu Vaccine status: Up to date  Pneumococcal vaccine status:  Up to date  Covid-19 vaccine status: Completed vaccines  Qualifies for Shingles Vaccine? Yes   Zostavax completed No   Shingrix Completed?: No.    Education has been provided regarding the importance of this vaccine. Patient has been advised to call insurance company to determine out of pocket expense if they have not yet received this vaccine. Advised may also receive vaccine at local pharmacy or Health Dept. Verbalized acceptance and understanding.  Screening Tests Health Maintenance  Topic Date Due   Zoster Vaccines- Shingrix (1 of 2) Never done   COVID-19 Vaccine (4 - Booster for Pfizer series) 08/08/2020   TETANUS/TDAP  12/19/2023   COLONOSCOPY (Pts 45-37yrs Insurance coverage will need to be confirmed)  01/17/2025   Pneumonia Vaccine 61+ Years old  Completed   INFLUENZA VACCINE  Completed   Hepatitis C Screening  Completed   HIV Screening  Completed   HPV VACCINES  Aged Out    Health Maintenance  Health Maintenance Due  Topic Date Due   Zoster Vaccines- Shingrix (1 of 2) Never done   COVID-19 Vaccine (4 - Booster for Pfizer series) 08/08/2020    Colorectal cancer screening: Type of screening: Colonoscopy. Completed 01/18/20. Repeat every 5 years  Lung Cancer Screening: (Low Dose CT Chest recommended if Age 31-80 years, 30 pack-year currently smoking OR have quit w/in 15years.) does qualify. Completed 06/29/20  A referral has been sent to [name] regarding the possible need for this exam. The patient's chart will be reviewed to determine if they qualify and the patient will be contacted to facilitate the scheduling of the Low Dose Chest CT for lung cancer screening.  Additional Screening:  Hepatitis C Screening: does qualify; Completed 05/19/15  Vision Screening: Recommended annual ophthalmology exams for early detection of glaucoma and other disorders of the eye. Is the patient up to date with their annual eye exam?  Yes  Who is the  provider or what is the name of the office in which the patient attends annual eye exams? [facility].   Dental Screening: Recommended annual dental exams for proper oral hygiene  Community Resource Referral / Chronic Care Management: CRR required this visit?  No   CCM required this visit?  No      Plan:     I have personally reviewed and noted the following in the patient's chart:   Medical and social history Use of alcohol, tobacco or illicit drugs  Current medications and supplements including opioid prescriptions. Patient is not currently taking opioid prescriptions. Functional ability and status Nutritional status Physical activity Advanced directives List of other physicians Hospitalizations, surgeries, and ER visits in previous 12 months Vitals Screenings to include cognitive, depression, and falls Referrals and appointments  In addition, I have reviewed and discussed with patient certain preventive protocols, quality metrics, and best practice recommendations. A written personalized care plan for preventive services as well as general preventive health recommendations were provided to patient.     Aaron Marker, LPN   [id number]   Nurse Notes: none

## 2021-05-24 NOTE — Patient Instructions (Signed)
[name] , Thank you for taking time to come for your Medicare Wellness Visit. I appreciate your ongoing commitment to your health goals. Please review the following plan we discussed and let me know if I can assist you in the future.   Screening recommendations/referrals: Colonoscopy: done 01/18/20. Repeat 01/2025 Recommended yearly ophthalmology/optometry visit for glaucoma screening and checkup Recommended yearly dental visit for hygiene and checkup  Vaccinations: Influenza vaccine: done 04/05/21 Pneumococcal vaccine: done 04/05/21 Tdap vaccine: done 12/18/13 Shingles vaccine: Shingrix discussed. Please contact your pharmacy for coverage information.  Covid-19:  done 216/21, 09/15/19 & 06/13/20  Advanced directives: Advance directive discussed with you today. I have provided a copy for you to complete at home and have notarized. Once this is complete please bring a copy in to our office so we can scan it into your chart.   Conditions/risks identified: If you wish to quit smoking, help is available. For free tobacco cessation program offerings call the [facility] at [phone] or Live Well Line at [phone]. You may also visit [email or web address] or email [email or web address] for more information on other programs.   Next appointment: Follow up in one year for your annual wellness visit.   Preventive Care 65 Years and Older, Male Preventive care refers to lifestyle choices and visits with your health care provider that can promote health and wellness. What does preventive care include? A yearly physical exam. This is also called an annual well check. Dental exams once or twice a year. Routine eye exams. Ask your health care provider how often you should have your eyes checked. Personal lifestyle choices, including: Daily care of your teeth and gums. Regular physical activity. Eating a healthy diet. Avoiding tobacco and drug use. Limiting alcohol  use. Practicing safe sex. Taking low doses of aspirin every day. Taking vitamin and mineral supplements as recommended by your health care provider. What happens during an annual well check? The services and screenings done by your health care provider during your annual well check will depend on your age, overall health, lifestyle risk factors, and family history of disease. Counseling  Your health care provider may ask you questions about your: Alcohol use. Tobacco use. Drug use. Emotional well-being. Home and relationship well-being. Sexual activity. Eating habits. History of falls. Memory and ability to understand (cognition). Work and work Statistician. Screening  You may have the following tests or measurements: Height, weight, and BMI. Blood pressure. Lipid and cholesterol levels. These may be checked every 5 years, or more frequently if you are over 20 years old. Skin check. Lung cancer screening. You may have this screening every year starting at age 36 if you have a 30-pack-year history of smoking and currently smoke or have quit within the past 15 years. Fecal occult blood test (FOBT) of the stool. You may have this test every year starting at age 61. Flexible sigmoidoscopy or colonoscopy. You may have a sigmoidoscopy every 5 years or a colonoscopy every 10 years starting at age 100. Prostate cancer screening. Recommendations will vary depending on your family history and other risks. Hepatitis C blood test. Hepatitis B blood test. Sexually transmitted disease (STD) testing. Diabetes screening. This is done by checking your blood sugar (glucose) after you have not eaten for a while (fasting). You may have this done every 1-3 years. Abdominal aortic aneurysm (AAA) screening. You may need this if you are a current or former smoker. Osteoporosis. You may be screened starting at age 29 if you are  at high risk. Talk with your health care provider about your test results,  treatment options, and if necessary, the need for more tests. Vaccines  Your health care provider may recommend certain vaccines, such as: Influenza vaccine. This is recommended every year. Tetanus, diphtheria, and acellular pertussis (Tdap, Td) vaccine. You may need a Td booster every 10 years. Zoster vaccine. You may need this after age 64. Pneumococcal 13-valent conjugate (PCV13) vaccine. One dose is recommended after age 33. Pneumococcal polysaccharide (PPSV23) vaccine. One dose is recommended after age 52. Talk to your health care provider about which screenings and vaccines you need and how often you need them. This information is not intended to replace advice given to you by your health care provider. Make sure you discuss any questions you have with your health care provider. Document Released: 07/22/2015 Document Revised: 03/14/2016 Document Reviewed: 04/26/2015 Elsevier Interactive Patient Education  2017 [place] Prevention in the Home Falls can cause injuries. They can happen to people of all ages. There are many things you can do to make your home safe and to help prevent falls. What can I do on the outside of my home? Regularly fix the edges of walkways and driveways and fix any cracks. Remove anything that might make you trip as you walk through a door, such as a raised step or threshold. Trim any bushes or trees on the path to your home. Use bright outdoor lighting. Clear any walking paths of anything that might make someone trip, such as rocks or tools. Regularly check to see if handrails are loose or broken. Make sure that both sides of any steps have handrails. Any raised decks and porches should have guardrails on the edges. Have any leaves, snow, or ice cleared regularly. Use sand or salt on walking paths during winter. Clean up any spills in your garage right away. This includes oil or grease spills. What can I do in the bathroom? Use night  lights. Install grab bars by the toilet and in the tub and shower. Do not use towel bars as grab bars. Use non-skid mats or decals in the tub or shower. If you need to sit down in the shower, use a plastic, non-slip stool. Keep the floor dry. Clean up any water that spills on the floor as soon as it happens. Remove soap buildup in the tub or shower regularly. Attach bath mats securely with double-sided non-slip rug tape. Do not have throw rugs and other things on the floor that can make you trip. What can I do in the bedroom? Use night lights. Make sure that you have a light by your bed that is easy to reach. Do not use any sheets or blankets that are too big for your bed. They should not hang down onto the floor. Have a firm chair that has side arms. You can use this for support while you get dressed. Do not have throw rugs and other things on the floor that can make you trip. What can I do in the kitchen? Clean up any spills right away. Avoid walking on wet floors. Keep items that you use a lot in easy-to-reach places. If you need to reach something above you, use a strong step stool that has a grab bar. Keep electrical cords out of the way. Do not use floor polish or wax that makes floors slippery. If you must use wax, use non-skid floor wax. Do not have throw rugs and other things on the floor  that can make you trip. What can I do with my stairs? Do not leave any items on the stairs. Make sure that there are handrails on both sides of the stairs and use them. Fix handrails that are broken or loose. Make sure that handrails are as long as the stairways. Check any carpeting to make sure that it is firmly attached to the stairs. Fix any carpet that is loose or worn. Avoid having throw rugs at the top or bottom of the stairs. If you do have throw rugs, attach them to the floor with carpet tape. Make sure that you have a light switch at the top of the stairs and the bottom of the stairs. If  you do not have them, ask someone to add them for you. What else can I do to help prevent falls? Wear shoes that: Do not have high heels. Have rubber bottoms. Are comfortable and fit you well. Are closed at the toe. Do not wear sandals. If you use a stepladder: Make sure that it is fully opened. Do not climb a closed stepladder. Make sure that both sides of the stepladder are locked into place. Ask someone to hold it for you, if possible. Clearly mark and make sure that you can see: Any grab bars or handrails. First and last steps. Where the edge of each step is. Use tools that help you move around (mobility aids) if they are needed. These include: Canes. Walkers. Scooters. Crutches. Turn on the lights when you go into a dark area. Replace any light bulbs as soon as they burn out. Set up your furniture so you have a clear path. Avoid moving your furniture around. If any of your floors are uneven, fix them. If there are any pets around you, be aware of where they are. Review your medicines with your doctor. Some medicines can make you feel dizzy. This can increase your chance of falling. Ask your doctor what other things that you can do to help prevent falls. This information is not intended to replace advice given to you by your health care provider. Make sure you discuss any questions you have with your health care provider. Document Released: 04/21/2009 Document Revised: 12/01/2015 Document Reviewed: 07/30/2014 Elsevier Interactive Patient Education  2017 [employer].

## 2021-06-16 ENCOUNTER — Ambulatory Visit
Admission: RE | Admit: 2021-06-16 | Discharge: 2021-06-16 | Disposition: A | Discharge status: Home / Self Care | Payer: PPO | Source: Ambulatory Visit | Attending: Internal Medicine | Admitting: Internal Medicine

## 2021-06-16 ENCOUNTER — Ambulatory Visit (INDEPENDENT_AMBULATORY_CARE_PROVIDER_SITE_OTHER): Payer: PPO | Admitting: Internal Medicine

## 2021-06-16 ENCOUNTER — Ambulatory Visit: Admission: RE | Admit: 2021-06-16 | Payer: PPO | Source: Home / Self Care

## 2021-06-16 ENCOUNTER — Other Ambulatory Visit: Payer: Self-pay

## 2021-06-16 ENCOUNTER — Encounter: Payer: Self-pay | Admitting: Internal Medicine

## 2021-06-16 ENCOUNTER — Ambulatory Visit
Admission: RE | Admit: 2021-06-16 | Discharge: 2021-06-16 | Disposition: A | Discharge status: Home / Self Care | Payer: PPO | Attending: Internal Medicine | Admitting: Internal Medicine

## 2021-06-16 VITALS — BP 114/70 | HR 72 | Temp 98.1°F | Ht 73.0 in | Wt 202.0 lb

## 2021-06-16 DIAGNOSIS — M7731 Calcaneal spur, right foot: Secondary | ICD-10-CM | POA: Diagnosis not present

## 2021-06-16 DIAGNOSIS — M79671 Pain in right foot: Secondary | ICD-10-CM | POA: Diagnosis not present

## 2021-06-16 DIAGNOSIS — M19071 Primary osteoarthritis, right ankle and foot: Secondary | ICD-10-CM | POA: Diagnosis not present

## 2021-06-16 NOTE — Progress Notes (Signed)
Date:  06/16/2021   Name:  Aaron [name]   DOB:  1956/01/28   MRN:  [id number]   Chief Complaint: Foot Pain (Right foot, lump )  Foot Pain This is a new (no injury) problem. Episode onset: 1.5 weeks. The problem occurs constantly. The problem has been unchanged. Associated symptoms include arthralgias (right foot pain). Pertinent negatives include no chills, fatigue, fever, joint swelling, numbness or rash. Associated symptoms comments: Lump, redness. The symptoms are aggravated by walking and standing. He has tried nothing for the symptoms.   Lab Results  Component Value Date   NA 134 04/05/2021   K 4.5 04/05/2021   CO2 22 04/05/2021   GLUCOSE 102 (H) 04/05/2021   BUN 15 04/05/2021   CREATININE 0.86 04/05/2021   CALCIUM 9.8 04/05/2021   EGFR 96 04/05/2021   GFRNONAA >60 12/28/2020   Lab Results  Component Value Date   CHOL 140 04/05/2021   HDL 36 (L) 04/05/2021   LDLCALC 83 04/05/2021   TRIG 114 04/05/2021   CHOLHDL 3.9 04/05/2021   Lab Results  Component Value Date   TSH 3.214 02/06/2019   Lab Results  Component Value Date   HGBA1C 5.8 (H) 04/05/2021   Lab Results  Component Value Date   WBC 6.6 12/29/2020   HGB 16.6 12/29/2020   HCT 47.9 12/29/2020   MCV 96.8 12/29/2020   PLT 181 12/29/2020   Lab Results  Component Value Date   ALT 44 04/05/2021   AST 31 04/05/2021   ALKPHOS 77 04/05/2021   BILITOT 0.6 04/05/2021   No results found for: 25OHVITD2, 25OHVITD3, VD25OH   Review of Systems  Constitutional:  Negative for chills, fatigue and fever.  Musculoskeletal:  Positive for arthralgias (right foot pain). Negative for joint swelling.  Skin:  Negative for color change, rash and wound.  Neurological:  Negative for numbness.   Patient Active Problem List   Diagnosis Date Noted   Unstable angina (HCC) 12/27/2020   Aortic atherosclerosis (HCC) 07/10/2020   Stage 2 moderate COPD by GOLD classification (HCC) 05/16/2020   Dyspepsia and disorder of  function of stomach 05/16/2020   Cervical disc disorder of mid-cervical region 05/10/2020   Polyp of descending colon    Mixed hyperlipidemia 10/28/2018   Tobacco use disorder 07/13/2016   Prediabetes 07/27/2015   Controlled gout 12/22/2014   Coronary artery disease involving native coronary artery of native heart with angina pectoris (HCC) 10/09/2014   Essential (primary) hypertension 10/09/2014   Dupuytren's disease of palm 10/09/2014   Psoriasis 10/09/2014   H/O cardiac catheterization 01/27/2014   Peripheral vascular disease (HCC) 01/27/2014    Allergies  Allergen Reactions   Contrast Media [Iodinated Diagnostic Agents] Hives   Iodine Hives    Past Surgical History:  Procedure Laterality Date   APPENDECTOMY     cardiac stents   2012   COLONOSCOPY  03/30/2013   [place]   COLONOSCOPY WITH PROPOFOL N/A 12/10/2019   Procedure: COLONOSCOPY WITH PROPOFOL;  Surgeon: Midge Minium, MD;  Location: [facility];  Service: Endoscopy;  Laterality: N/A;   COLONOSCOPY WITH PROPOFOL N/A 01/18/2020   Procedure: COLONOSCOPY WITH PROPOFOL;  Surgeon: Midge Minium, MD;  Location: [facility];  Service: Endoscopy;  Laterality: N/A;  4   LEFT HEART CATH AND CORONARY ANGIOGRAPHY N/A 12/29/2020   Procedure: LEFT HEART CATH AND CORONARY ANGIOGRAPHY;  Surgeon: Lamar Blinks, MD;  Location: [facility];  Service: Cardiovascular;  Laterality: N/A;   LOWER EXTREMITY  ANGIOGRAPHY Right 09/25/2018   Procedure: LOWER EXTREMITY ANGIOGRAPHY;  Surgeon: Algernon Huxley, MD;  Location: [place] [facility];  Service: Cardiovascular;  Laterality: Right;   LOWER EXTREMITY ANGIOGRAPHY Left 03/17/2020   Procedure: LOWER EXTREMITY ANGIOGRAPHY;  Surgeon: Algernon Huxley, MD;  Location: [place] [facility];  Service: Cardiovascular;  Laterality: Left;   POLYPECTOMY N/A 01/18/2020   Procedure: POLYPECTOMY;  Surgeon: [name], MD;  Location: [place];  Service: Endoscopy;  Laterality: N/A;     Social History   Tobacco Use   Smoking status: Every Day    Packs/day: 1.00    Years: 46.00    Pack years: 46.00    Types: Cigarettes   Smokeless tobacco: Never   Tobacco comments:    Pt down to 1/2 PPD 05/24/21  Vaping Use   Vaping Use: Never used  Substance Use Topics   Alcohol use: Not Currently    Comment: quit drinking around 04/2019(May have drink 1x/mo)   Drug use: No     Medication list has been reviewed and updated.  Current Meds  Medication Sig   albuterol (VENTOLIN HFA) 108 (90 Base) MCG/ACT inhaler Inhale 2 puffs into the lungs every 6 (six) hours as needed for wheezing or shortness of breath.   aspirin EC 81 MG tablet Take 1 tablet (81 mg total) by mouth daily.   cilostazol (PLETAL) 50 MG tablet Take by mouth 2 (two) times daily.    clopidogrel (PLAVIX) 75 MG tablet Take 1 tablet by mouth daily.   indomethacin (INDOCIN) 25 MG capsule Take 1 capsule (25 mg total) by mouth 2 (two) times daily as needed.   isosorbide mononitrate (IMDUR) 120 MG 24 hr tablet Take 120 mg by mouth daily.   lisinopril (PRINIVIL,ZESTRIL) 10 MG tablet Take 1 tablet by mouth daily.   metoprolol tartrate (LOPRESSOR) 50 MG tablet Take 50-75 mg by mouth 2 (two) times a day. Take $RemoveB'50MG'bdUDSVHh$  by mouth every morning and $RemoveBef'75MG'zHaVxAnRWY$  every evening   nitroGLYCERIN (NITROSTAT) 0.4 MG SL tablet Place 1 tablet under the tongue as needed.   simvastatin (ZOCOR) 40 MG tablet Take 1 tablet (40 mg total) by mouth at bedtime.    PHQ 2/9 Scores 06/16/2021 05/24/2021 04/05/2021 01/04/2021  PHQ - 2 Score 0 0 0 0  PHQ- 9 Score 0 - 1 1    GAD 7 : Generalized Anxiety Score 06/16/2021 04/05/2021 01/04/2021 09/16/2020  Nervous, Anxious, on Edge 0 0 0 0  Control/stop worrying 0 0 0 0  Worry too much - different things 0 0 0 0  Trouble relaxing 0 0 0 0  Restless 0 0 0 0  Easily annoyed or irritable 0 0 0 0  Afraid - awful might happen 0 0 0 0  Total GAD 7 Score 0 0 0 0  Anxiety Difficulty Not difficult at all Not difficult  at all Not difficult at all -    BP Readings from Last 3 Encounters:  06/16/21 114/70  04/05/21 132/70  02/03/21 110/69    Physical Exam Vitals and nursing note reviewed.  Constitutional:      General: He is not in acute distress.    Appearance: He is well-developed.  HENT:     Head: Normocephalic and atraumatic.  Cardiovascular:     Rate and Rhythm: Normal rate and regular rhythm.  Pulmonary:     Effort: Pulmonary effort is normal. No respiratory distress.     Breath sounds: No wheezing or rhonchi.  Musculoskeletal:  Right foot: Bony tenderness (over distal 4th metatarsal - subtle bony enlargement - no color change, warmth or erythema) present.  Skin:    General: Skin is warm and dry.     Findings: No rash.  Neurological:     Mental Status: He is alert and oriented to person, place, and time.  Psychiatric:        Mood and Affect: Mood normal.        Behavior: Behavior normal.    Wt Readings from Last 3 Encounters:  06/16/21 202 lb (91.6 kg)  04/05/21 197 lb (89.4 kg)  02/03/21 194 lb (88 kg)    BP 114/70   Pulse 72   Temp 98.1 F (36.7 C) (Oral)   Ht $R'6\' 1"'IK$  (1.854 m)   Wt 202 lb (91.6 kg)   SpO2 94%   BMI 26.65 kg/m   Assessment and Plan: 1. Right foot pain DDx - gout, stress fracture, Morton Neuroma Recommend 3 day trial of Colchicine; decrease weight bearing and/or use hard soled shoe Pt unable to get Xray today - will come next week for imaging - DG Foot Complete Right   Partially dictated using Dragon software. Any errors are unintentional.  Halina Maidens, MD [facility]  06/16/2021

## 2021-06-20 ENCOUNTER — Other Ambulatory Visit: Payer: Self-pay | Admitting: *Deleted

## 2021-06-20 DIAGNOSIS — Z87891 Personal history of nicotine dependence: Secondary | ICD-10-CM

## 2021-06-20 DIAGNOSIS — F17210 Nicotine dependence, cigarettes, uncomplicated: Secondary | ICD-10-CM

## 2021-07-19 DIAGNOSIS — F17200 Nicotine dependence, unspecified, uncomplicated: Secondary | ICD-10-CM | POA: Diagnosis not present

## 2021-08-11 ENCOUNTER — Ambulatory Visit (INDEPENDENT_AMBULATORY_CARE_PROVIDER_SITE_OTHER): Payer: PPO | Admitting: Vascular Surgery

## 2021-08-11 ENCOUNTER — Ambulatory Visit (INDEPENDENT_AMBULATORY_CARE_PROVIDER_SITE_OTHER): Payer: PPO

## 2021-08-11 ENCOUNTER — Encounter (INDEPENDENT_AMBULATORY_CARE_PROVIDER_SITE_OTHER): Payer: Self-pay | Admitting: Vascular Surgery

## 2021-08-11 ENCOUNTER — Other Ambulatory Visit: Payer: Self-pay

## 2021-08-11 VITALS — BP 145/82 | HR 65 | Resp 16 | Wt 201.0 lb

## 2021-08-11 DIAGNOSIS — I70211 Atherosclerosis of native arteries of extremities with intermittent claudication, right leg: Secondary | ICD-10-CM | POA: Diagnosis not present

## 2021-08-11 DIAGNOSIS — I739 Peripheral vascular disease, unspecified: Secondary | ICD-10-CM

## 2021-08-11 DIAGNOSIS — E782 Mixed hyperlipidemia: Secondary | ICD-10-CM | POA: Diagnosis not present

## 2021-08-11 DIAGNOSIS — I10 Essential (primary) hypertension: Secondary | ICD-10-CM | POA: Diagnosis not present

## 2021-08-11 NOTE — Assessment & Plan Note (Signed)
ABIs today are 0.97 on the right and 1.03 on the left with digit pressure of 81 on the right and 105 on the left.  Mild claudication symptoms which are not lifestyle limiting.  Intervention on the left leg remains patent.  Recheck 1 more time in 6 months then we can probably go annually.  Continue current medical regimen.

## 2021-08-11 NOTE — Progress Notes (Signed)
MRN : [id number]  Aaron [name] is a 66 y.o. ([birth year]) male who presents with chief complaint of  Chief Complaint  Patient presents with   Follow-up    Ultrasound follow up  .  History of Present Illness: Patient returns today in follow up of his PAD.  He is about a year and a half status post left lower extremity revascularization for short distance claudication.  He has some mild claudication largely on the right leg currently but nothing that is lifestyle limiting more significant at this point.  No rest pain or ulceration.  He is having more hip and back arthritis symptoms it sounds like at this time. ABIs today are 0.97 on the right and 1.03 on the left with digit pressure of 81 on the right and 105 on the left.  Current Outpatient Medications  Medication Sig Dispense Refill   albuterol (VENTOLIN HFA) 108 (90 Base) MCG/ACT inhaler Inhale 2 puffs into the lungs every 6 (six) hours as needed for wheezing or shortness of breath. 6.7 g 5   aspirin EC 81 MG tablet Take 1 tablet (81 mg total) by mouth daily. 150 tablet 2   cilostazol (PLETAL) 50 MG tablet Take by mouth 2 (two) times daily.      clopidogrel (PLAVIX) 75 MG tablet Take 1 tablet by mouth daily.     indomethacin (INDOCIN) 25 MG capsule Take 1 capsule (25 mg total) by mouth 2 (two) times daily as needed. 30 capsule 1   isosorbide mononitrate (IMDUR) 120 MG 24 hr tablet Take 120 mg by mouth daily.     lisinopril (PRINIVIL,ZESTRIL) 10 MG tablet Take 1 tablet by mouth daily.     metoprolol tartrate (LOPRESSOR) 50 MG tablet Take 50-75 mg by mouth 2 (two) times a day. Take 50MG  by mouth every morning and 75MG  every evening     nitroGLYCERIN (NITROSTAT) 0.4 MG SL tablet Place 1 tablet under the tongue as needed.     simvastatin (ZOCOR) 40 MG tablet Take 1 tablet (40 mg total) by mouth at bedtime. 30 tablet 5   No current facility-administered medications for this visit.    Past Medical History:  Diagnosis Date   COPD  (chronic obstructive pulmonary disease) ([place])    Coronary artery disease    s/p PCI   Fracture of left ankle, closed, initial encounter 10/23/2017   GERD (gastroesophageal reflux disease)    Glaucoma    Gout    Hx of heart artery stent 01/12/2017   Hypercholesteremia    Hypertension    PAD (peripheral artery disease) ([place])    Pain in limb 07/13/2016   Syncope 09/16/2017   Unstable angina ([place]) 01/12/2017   Vertigo     Past Surgical History:  Procedure Laterality Date   APPENDECTOMY     cardiac stents   2012   COLONOSCOPY  03/30/2013   [place]   COLONOSCOPY WITH PROPOFOL N/A 12/10/2019   Procedure: COLONOSCOPY WITH PROPOFOL;  Surgeon: [name], MD;  Location: [place];  Service: Endoscopy;  Laterality: N/A;   COLONOSCOPY WITH PROPOFOL N/A 01/18/2020   Procedure: COLONOSCOPY WITH PROPOFOL;  Surgeon: [name], MD;  Location: [place];  Service: Endoscopy;  Laterality: N/A;  4   LEFT HEART CATH AND CORONARY ANGIOGRAPHY N/A 12/29/2020   Procedure: LEFT HEART CATH AND CORONARY ANGIOGRAPHY;  Surgeon: Corey Skains, MD;  Location: [place] [facility];  Service: Cardiovascular;  Laterality: N/A;   LOWER EXTREMITY ANGIOGRAPHY Right 09/25/2018  Procedure: LOWER EXTREMITY ANGIOGRAPHY;  Surgeon: Algernon Huxley, MD;  Location: [place] [facility];  Service: Cardiovascular;  Laterality: Right;   LOWER EXTREMITY ANGIOGRAPHY Left 03/17/2020   Procedure: LOWER EXTREMITY ANGIOGRAPHY;  Surgeon: Algernon Huxley, MD;  Location: [place] [facility];  Service: Cardiovascular;  Laterality: Left;   POLYPECTOMY N/A 01/18/2020   Procedure: POLYPECTOMY;  Surgeon: [name], MD;  Location: [place];  Service: Endoscopy;  Laterality: N/A;     Social History   Tobacco Use   Smoking status: Every Day    Packs/day: 1.00    Years: 46.00    Pack years: 46.00    Types: Cigarettes   Smokeless tobacco: Never   Tobacco comments:    Pt down to 1/2 PPD 05/24/21  Vaping Use    Vaping Use: Never used  Substance Use Topics   Alcohol use: Not Currently    Comment: quit drinking around 04/2019(May have drink 1x/mo)   Drug use: No       Family History  Problem Relation Age of Onset   Hypertension Mother    Diabetes Mother    Heart failure Father        age 68   Cancer Father    Ovarian cancer Sister    Heart disease Brother      Allergies  Allergen Reactions   Contrast Media [Iodinated Contrast Media] Hives   Iodine Hives     REVIEW OF SYSTEMS (Negative unless checked)  Constitutional: [] Weight loss  [] Fever  [] Chills Cardiac: [] Chest pain   [] Chest pressure   [] Palpitations   [] Shortness of breath when laying flat   [] Shortness of breath at rest   [] Shortness of breath with exertion. Vascular:  [] Pain in legs with walking   [] Pain in legs at rest   [] Pain in legs when laying flat   [x] Claudication   [] Pain in feet when walking  [] Pain in feet at rest  [] Pain in feet when laying flat   [] History of DVT   [] Phlebitis   [] Swelling in legs   [] Varicose veins   [] Non-healing ulcers Pulmonary:   [] Uses home oxygen   [] Productive cough   [] Hemoptysis   [] Wheeze  [] COPD   [] Asthma Neurologic:  [] Dizziness  [] Blackouts   [] Seizures   [] History of stroke   [] History of TIA  [] Aphasia   [] Temporary blindness   [] Dysphagia   [] Weakness or numbness in arms   [] Weakness or numbness in legs Musculoskeletal:  [x] Arthritis   [] Joint swelling   [x] Joint pain   [] Low back pain Hematologic:  [] Easy bruising  [] Easy bleeding   [] Hypercoagulable state   [] Anemic   Gastrointestinal:  [] Blood in stool   [] Vomiting blood  [] Gastroesophageal reflux/heartburn   [] Abdominal pain Genitourinary:  [] Chronic kidney disease   [] Difficult urination  [] Frequent urination  [] Burning with urination   [] Hematuria Skin:  [] Rashes   [] Ulcers   [] Wounds Psychological:  [] History of anxiety   []  History of major depression.  Physical Examination  BP (!) 145/82 (BP Location: Right Arm)     Pulse 65    Resp 16    Wt 201 lb (91.2 kg)    BMI 26.52 kg/m  Gen:  WD/WN, NAD Head: Combine/AT, No temporalis wasting. Ear/Nose/Throat: Hearing grossly intact, nares w/o erythema or drainage Eyes: Conjunctiva clear. Sclera non-icteric Neck: Supple.  Trachea midline Pulmonary:  Good air movement, no use of accessory muscles.  Cardiac: RRR, no JVD Vascular:  Vessel Right Left  Radial Palpable Palpable  PT Palpable Palpable  DP Palpable Palpable   Gastrointestinal: soft, non-tender/non-distended. No guarding/reflex.  Musculoskeletal: M/S 5/5 throughout.  No deformity or atrophy.  No edema. Neurologic: Sensation grossly intact in extremities.  Symmetrical.  Speech is fluent.  Psychiatric: Judgment intact, Mood & affect appropriate for pt's clinical situation. Dermatologic: No rashes or ulcers noted.  No cellulitis or open wounds.      Labs No results found for this or any previous visit (from the past 2160 hour(s)).  Radiology No results found.  Assessment/Plan Essential (primary) hypertension blood pressure control important in reducing the progression of atherosclerotic disease. On appropriate oral medications.     Mixed hyperlipidemia lipid control important in reducing the progression of atherosclerotic disease. Continue statin therapy  Atherosclerosis of native arteries of extremity with intermittent claudication (HCC) ABIs today are 0.97 on the right and 1.03 on the left with digit pressure of 81 on the right and 105 on the left.  Mild claudication symptoms which are not lifestyle limiting.  Intervention on the left leg remains patent.  Recheck 1 more time in 6 months then we can probably go annually.  Continue current medical regimen.    [name], MD  08/11/2021 11:07 AM    This note was created with Dragon medical transcription system.  Any errors from dictation are purely unintentional

## 2021-08-17 ENCOUNTER — Encounter: Payer: Self-pay | Admitting: Internal Medicine

## 2021-08-17 ENCOUNTER — Ambulatory Visit (INDEPENDENT_AMBULATORY_CARE_PROVIDER_SITE_OTHER): Payer: PPO | Admitting: Internal Medicine

## 2021-08-17 ENCOUNTER — Other Ambulatory Visit: Payer: Self-pay

## 2021-08-17 VITALS — BP 128/74 | HR 74 | Ht 73.0 in | Wt 199.2 lb

## 2021-08-17 DIAGNOSIS — I10 Essential (primary) hypertension: Secondary | ICD-10-CM | POA: Diagnosis not present

## 2021-08-17 DIAGNOSIS — R233 Spontaneous ecchymoses: Secondary | ICD-10-CM | POA: Diagnosis not present

## 2021-08-17 DIAGNOSIS — R7303 Prediabetes: Secondary | ICD-10-CM | POA: Diagnosis not present

## 2021-08-17 DIAGNOSIS — M7061 Trochanteric bursitis, right hip: Secondary | ICD-10-CM

## 2021-08-17 LAB — POCT GLYCOSYLATED HEMOGLOBIN (HGB A1C): Hemoglobin A1C: 5.5 % (ref 4.0–5.6)

## 2021-08-17 MED ORDER — PREDNISONE 10 MG PO TABS: 10.0000 mg | ORAL_TABLET | ORAL | 0 refills | Status: AC

## 2021-08-17 NOTE — Patient Instructions (Signed)
Use ice or heat 15 minutes three times a day to hips

## 2021-08-17 NOTE — Progress Notes (Signed)
Date:  08/17/2021   Name:  Aaron [name]   DOB:  [birth year]   MRN:  [id number]   Chief Complaint: Hip Pain  Hip Pain  There was no injury mechanism. The pain is present in the left hip and right hip (Right Hip is worse.). The quality of the pain is described as aching. The pain is moderate. The pain has been Constant since onset. Associated symptoms include an inability to bear weight. Pertinent negatives include no numbness or tingling. The symptoms are aggravated by movement, weight bearing and palpation. He has tried nothing for the symptoms.  Hypertension This is a chronic problem. The problem is controlled. Pertinent negatives include no chest pain, headaches, palpitations or shortness of breath. Past treatments include ACE inhibitors and beta blockers. The current treatment provides significant improvement. Hypertensive end-organ damage includes CAD/MI and PVD.  Diabetes He presents for his follow-up diabetic visit. Diabetes type: prediabet. His disease course has been stable. Pertinent negatives for hypoglycemia include no dizziness or headaches. Pertinent negatives for diabetes include no chest pain, no fatigue and no weakness. Diabetic complications include PVD. Current diabetic treatment includes diet. He is compliant with treatment most of the time.   Lab Results  Component Value Date   NA 134 04/05/2021   K 4.5 04/05/2021   CO2 22 04/05/2021   GLUCOSE 102 (H) 04/05/2021   BUN 15 04/05/2021   CREATININE 0.86 04/05/2021   CALCIUM 9.8 04/05/2021   EGFR 96 04/05/2021   GFRNONAA >60 12/28/2020   Lab Results  Component Value Date   CHOL 140 04/05/2021   HDL 36 (L) 04/05/2021   LDLCALC 83 04/05/2021   TRIG 114 04/05/2021   CHOLHDL 3.9 04/05/2021   Lab Results  Component Value Date   TSH 3.214 02/06/2019   Lab Results  Component Value Date   HGBA1C 5.5 08/17/2021   Lab Results  Component Value Date   WBC 6.6 12/29/2020   HGB 16.6 12/29/2020   HCT 47.9 12/29/2020    MCV 96.8 12/29/2020   PLT 181 12/29/2020   Lab Results  Component Value Date   ALT 44 04/05/2021   AST 31 04/05/2021   ALKPHOS 77 04/05/2021   BILITOT 0.6 04/05/2021   No results found for: 25OHVITD2, 25OHVITD3, VD25OH   Review of Systems  Constitutional:  Negative for fatigue and unexpected weight change.  HENT:  Negative for nosebleeds.   Eyes:  Negative for visual disturbance.  Respiratory:  Negative for cough, chest tightness, shortness of breath and wheezing.   Cardiovascular:  Negative for chest pain, palpitations and leg swelling.  Gastrointestinal:  Negative for abdominal pain, constipation and diarrhea.  Musculoskeletal:  Positive for arthralgias and gait problem.  Neurological:  Negative for dizziness, tingling, weakness, light-headedness, numbness and headaches.   Patient Active Problem List   Diagnosis Date Noted   Unstable angina ([place]) 12/27/2020   Aortic atherosclerosis ([place]) 07/10/2020   Stage 2 moderate COPD by GOLD classification ([place]) 05/16/2020   Dyspepsia and disorder of function of stomach 05/16/2020   Cervical disc disorder of mid-cervical region 05/10/2020   Polyp of descending colon    Mixed hyperlipidemia 10/28/2018   Atherosclerosis of native arteries of extremity with intermittent claudication ([place]) 07/13/2016   Tobacco use disorder 07/13/2016   Prediabetes 07/27/2015   Controlled gout 12/22/2014   Coronary artery disease involving native coronary artery of native heart with angina pectoris ([place]) 10/09/2014   Essential (primary) hypertension 10/09/2014   Dupuytren's disease of palm 10/09/2014  Psoriasis 10/09/2014   H/O cardiac catheterization 01/27/2014   Peripheral vascular disease ([place]) 01/27/2014    Allergies  Allergen Reactions   Contrast Media [Iodinated Contrast Media] Hives   Iodine Hives    Past Surgical History:  Procedure Laterality Date   APPENDECTOMY     cardiac stents   2012   COLONOSCOPY  03/30/2013   [place]    COLONOSCOPY WITH PROPOFOL N/A 12/10/2019   Procedure: COLONOSCOPY WITH PROPOFOL;  Surgeon: [name], MD;  Location: [place];  Service: Endoscopy;  Laterality: N/A;   COLONOSCOPY WITH PROPOFOL N/A 01/18/2020   Procedure: COLONOSCOPY WITH PROPOFOL;  Surgeon: [name], MD;  Location: [place];  Service: Endoscopy;  Laterality: N/A;  4   LEFT HEART CATH AND CORONARY ANGIOGRAPHY N/A 12/29/2020   Procedure: LEFT HEART CATH AND CORONARY ANGIOGRAPHY;  Surgeon: Corey Skains, MD;  Location: [facility] [facility];  Service: Cardiovascular;  Laterality: N/A;   LOWER EXTREMITY ANGIOGRAPHY Right 09/25/2018   Procedure: LOWER EXTREMITY ANGIOGRAPHY;  Surgeon: Algernon Huxley, MD;  Location: [facility];  Service: Cardiovascular;  Laterality: Right;   LOWER EXTREMITY ANGIOGRAPHY Left 03/17/2020   Procedure: LOWER EXTREMITY ANGIOGRAPHY;  Surgeon: Algernon Huxley, MD;  Location: [place] [facility];  Service: Cardiovascular;  Laterality: Left;   POLYPECTOMY N/A 01/18/2020   Procedure: POLYPECTOMY;  Surgeon: [name], MD;  Location: [place];  Service: Endoscopy;  Laterality: N/A;    Social History   Tobacco Use   Smoking status: Every Day    Packs/day: 1.00    Years: 46.00    Pack years: 46.00    Types: Cigarettes   Smokeless tobacco: Never   Tobacco comments:    Pt down to 1/2 PPD 05/24/21  Vaping Use   Vaping Use: Never used  Substance Use Topics   Alcohol use: Not Currently    Comment: quit drinking around 04/2019(May have drink 1x/mo)   Drug use: No     Medication list has been reviewed and updated.  Current Meds  Medication Sig   albuterol (VENTOLIN HFA) 108 (90 Base) MCG/ACT inhaler Inhale 2 puffs into the lungs every 6 (six) hours as needed for wheezing or shortness of breath.   aspirin EC 81 MG tablet Take 1 tablet (81 mg total) by mouth daily.   cilostazol (PLETAL) 50 MG tablet Take by mouth 2 (two) times daily.    clopidogrel (PLAVIX) 75  MG tablet Take 1 tablet by mouth daily.   indomethacin (INDOCIN) 25 MG capsule Take 1 capsule (25 mg total) by mouth 2 (two) times daily as needed.   isosorbide mononitrate (IMDUR) 120 MG 24 hr tablet Take 120 mg by mouth daily.   lisinopril (PRINIVIL,ZESTRIL) 10 MG tablet Take 1 tablet by mouth daily.   metoprolol tartrate (LOPRESSOR) 50 MG tablet Take 50-75 mg by mouth 2 (two) times a day. Take 50MG by mouth every morning and 75MG every evening   nitroGLYCERIN (NITROSTAT) 0.4 MG SL tablet Place 1 tablet under the tongue as needed.   predniSONE (DELTASONE) 10 MG tablet Take 1 tablet (10 mg total) by mouth as directed for 6 days. Take 6,5,4,3,2,1 then stop   simvastatin (ZOCOR) 40 MG tablet Take 1 tablet (40 mg total) by mouth at bedtime.    PHQ 2/9 Scores 08/17/2021 06/16/2021 05/24/2021 04/05/2021  PHQ - 2 Score 0 0 0 0  PHQ- 9 Score 0 0 - 1    GAD 7 : Generalized Anxiety Score 08/17/2021  06/16/2021 04/05/2021 01/04/2021  Nervous, Anxious, on Edge 0 0 0 0  Control/stop worrying 0 0 0 0  Worry too much - different things 0 0 0 0  Trouble relaxing 0 0 0 0  Restless 0 0 0 0  Easily annoyed or irritable 0 0 0 0  Afraid - awful might happen 0 0 0 0  Total GAD 7 Score 0 0 0 0  Anxiety Difficulty Not difficult at all Not difficult at all Not difficult at all Not difficult at all    BP Readings from Last 3 Encounters:  08/17/21 128/74  08/11/21 (!) 145/82  06/16/21 114/70    Physical Exam Vitals and nursing note reviewed.  Constitutional:      General: He is not in acute distress.    Appearance: Normal appearance. He is well-developed.  HENT:     Head: Normocephalic and atraumatic.  Cardiovascular:     Rate and Rhythm: Normal rate and regular rhythm.  Pulmonary:     Effort: Pulmonary effort is normal. No respiratory distress.     Breath sounds: No wheezing or rhonchi.  Musculoskeletal:     Cervical back: Normal range of motion.     Right hip: Bony tenderness present. No deformity or  crepitus. Normal range of motion.     Left hip: Bony tenderness present. No deformity or crepitus. Normal range of motion.     Right lower leg: No edema.     Left lower leg: No edema.  Lymphadenopathy:     Cervical: No cervical adenopathy.  Skin:    General: Skin is warm and dry.     Findings: No rash.  Neurological:     Mental Status: He is alert and oriented to person, place, and time.  Psychiatric:        Mood and Affect: Mood normal.        Behavior: Behavior normal.    Wt Readings from Last 3 Encounters:  08/17/21 199 lb 3.2 oz (90.4 kg)  08/11/21 201 lb (91.2 kg)  06/16/21 202 lb (91.6 kg)    BP 128/74    Pulse 74    Ht _0  (1.854 m)    Wt 199 lb 3.2 oz (90.4 kg)    SpO2 99%    BMI 26.28 kg/m   Assessment and Plan: 1. Trochanteric bursitis of right hip Recommend heat or ice three times a day for 15 minutes If no improvement, consider SM referral - predniSONE (DELTASONE) 10 MG tablet; Take 1 tablet (10 mg total) by mouth as directed for 6 days. Take 6,5,4,3,2,1 then stop  Dispense: 21 tablet; Refill: 0  2. Prediabetes Continue exercise and low carb diet A1C is now normal. - POCT glycosylated hemoglobin (Hb A1C) = 5.5  3. Essential (primary) hypertension Clinically stable exam with well controlled BP on metoprolol and lisinopril. Tolerating medications without side effects at this time. Pt to continue current regimen and low sodium diet;   Partially dictated using Editor, commissioning. Any errors are unintentional.  Halina Maidens, MD [facility]  08/17/2021

## 2021-08-29 DIAGNOSIS — J449 Chronic obstructive pulmonary disease, unspecified: Secondary | ICD-10-CM | POA: Diagnosis not present

## 2021-08-29 DIAGNOSIS — E785 Hyperlipidemia, unspecified: Secondary | ICD-10-CM | POA: Diagnosis not present

## 2021-08-29 DIAGNOSIS — I2510 Atherosclerotic heart disease of native coronary artery without angina pectoris: Secondary | ICD-10-CM | POA: Diagnosis not present

## 2021-08-29 DIAGNOSIS — I10 Essential (primary) hypertension: Secondary | ICD-10-CM | POA: Diagnosis not present

## 2021-08-30 DIAGNOSIS — L821 Other seborrheic keratosis: Secondary | ICD-10-CM | POA: Diagnosis not present

## 2021-08-30 DIAGNOSIS — L720 Epidermal cyst: Secondary | ICD-10-CM | POA: Diagnosis not present

## 2021-08-30 DIAGNOSIS — D485 Neoplasm of uncertain behavior of skin: Secondary | ICD-10-CM | POA: Diagnosis not present

## 2021-09-22 DIAGNOSIS — H40003 Preglaucoma, unspecified, bilateral: Secondary | ICD-10-CM | POA: Diagnosis not present

## 2021-09-27 ENCOUNTER — Ambulatory Visit: Payer: Self-pay | Admitting: *Deleted

## 2021-09-27 NOTE — Telephone Encounter (Signed)
Reason for Disposition ? Pain radiates into the thigh or further down the leg now ? ?Answer Assessment - Initial Assessment Questions ?1. MECHANISM: "How did the injury happen?" (Consider the possibility of domestic violence or elder abuse) ?    Right lower back pain.   It started hurting last night.   When get up from [place] I have to get my pain.   Having pain down into buttocks and to back of my kneecap.    ?An pit bull attacked my dog yesterday and I fell trying to break them up. ?2. ONSET: "When did the injury happen?" (Minutes or hours ago) ?    Yesterday ?3. LOCATION: "What part of the back is injured?" ?    Lower back ?4. SEVERITY: "Can you move the back normally?" ?    Yes ?5. PAIN: "Is there any pain?" If Yes, ask: "How bad is the pain?"   (Scale 1-10; or mild, moderate, severe) ?    Yes ?6. CORD SYMPTOMS: Any weakness or numbness of the arms or legs?" ?    Yes down right right buttock and to back of kneecap. ?7. SIZE: For cuts, bruises, or swelling, ask: "How large is it?" (e.g., inches or centimeters) ?    *No Answer* ?8. TETANUS: For any breaks in the skin, ask: "When was the last tetanus booster?" ?    *No Answer* ?9. OTHER SYMPTOMS: "Do you have any other symptoms?" (e.g., abdominal pain, blood in urine) ?    *No Answer* ?10. PREGNANCY: "Is there any chance you are pregnant?" "When was your last menstrual period?" ?      *No Answer* ? ?Protocols used: Back Injury-A-AH ? ?

## 2021-09-27 NOTE — Telephone Encounter (Signed)
?  Chief Complaint: Right lower back pain with pain radiating into right buttock and down to the back of his right kneecap.    Fell yesterday while breaking up a dog fight ?Symptoms: above ?Frequency: Yesterday happened ?Pertinent Negatives: Patient denies numbness/tingling in his leg, just pain ?Disposition: '[]'$ ED /'[]'$ Urgent Care (no appt availability in office) / '[x]'$ Appointment(In office/virtual)/ '[]'$  [facility]/ '[]'$ Home Care/ '[]'$ Refused Recommended Disposition /'[]'$ [facility]/ '[]'$  Follow-up with PCP ?Additional Notes:   ?

## 2021-09-28 ENCOUNTER — Ambulatory Visit (INDEPENDENT_AMBULATORY_CARE_PROVIDER_SITE_OTHER): Payer: PPO | Admitting: Internal Medicine

## 2021-09-28 ENCOUNTER — Other Ambulatory Visit: Payer: Self-pay

## 2021-09-28 ENCOUNTER — Encounter: Payer: Self-pay | Admitting: Internal Medicine

## 2021-09-28 VITALS — BP 122/76 | HR 76 | Ht 73.0 in | Wt 195.4 lb

## 2021-09-28 DIAGNOSIS — M62830 Muscle spasm of back: Secondary | ICD-10-CM

## 2021-09-28 MED ORDER — MELOXICAM 15 MG PO TABS: 15.0000 mg | ORAL_TABLET | Freq: Every day | ORAL | 0 refills | Status: DC

## 2021-09-28 MED ORDER — CYCLOBENZAPRINE HCL 10 MG PO TABS
10.0000 mg | ORAL_TABLET | Freq: Three times a day (TID) | ORAL | 0 refills | Status: DC | PRN
Start: 2021-09-28 — End: 2021-10-06

## 2021-09-28 NOTE — Progress Notes (Signed)
? ? ?Date:  09/28/2021  ? ?Name:  Aaron [name]   DOB:  January 29, 1956   MRN:  [id number] ? ? ?Chief Complaint: Back Pain (Rt lumbar. Started yesterday.) ? ?Back Pain ?This is a new problem. The current episode started yesterday (After breaking up a dog fight and falling backwards over the dog 2 x). The problem occurs constantly. The problem has been waxing and waning since onset. The pain is present in the lumbar spine. The quality of the pain is described as aching, cramping, shooting and stabbing. The pain does not radiate. The pain is moderate. The symptoms are aggravated by twisting, position, lying down and bending. Pertinent negatives include no dysuria, numbness or weakness.  ? ?Lab Results  ?Component Value Date  ? NA 134 04/05/2021  ? K 4.5 04/05/2021  ? CO2 22 04/05/2021  ? GLUCOSE 102 (H) 04/05/2021  ? BUN 15 04/05/2021  ? CREATININE 0.86 04/05/2021  ? CALCIUM 9.8 04/05/2021  ? EGFR 96 04/05/2021  ? GFRNONAA >60 12/28/2020  ? ?Lab Results  ?Component Value Date  ? CHOL 140 04/05/2021  ? HDL 36 (L) 04/05/2021  ? [place] 83 04/05/2021  ? TRIG 114 04/05/2021  ? CHOLHDL 3.9 04/05/2021  ? ?Lab Results  ?Component Value Date  ? TSH 3.214 02/06/2019  ? ?Lab Results  ?Component Value Date  ? HGBA1C 5.5 08/17/2021  ? ?Lab Results  ?Component Value Date  ? WBC 6.6 12/29/2020  ? HGB 16.6 12/29/2020  ? HCT 47.9 12/29/2020  ? MCV 96.8 12/29/2020  ? PLT 181 12/29/2020  ? ?Lab Results  ?Component Value Date  ? ALT 44 04/05/2021  ? AST 31 04/05/2021  ? ALKPHOS 77 04/05/2021  ? BILITOT 0.6 04/05/2021  ? ?No results found for: 25OHVITD2, [place], VD25OH  ? ?Review of Systems  ?Gastrointestinal:  Negative for abdominal distention.  ?Genitourinary:  Negative for difficulty urinating and dysuria.  ?Musculoskeletal:  Positive for back pain and myalgias. Negative for arthralgias.  ?Neurological:  Negative for tremors, weakness and numbness.  ?Psychiatric/Behavioral:  Negative for dysphoric mood and sleep disturbance. The  patient is not nervous/anxious.   ? ?Patient Active Problem List  ? Diagnosis Date Noted  ? Unstable angina ([place]) 12/27/2020  ? Aortic atherosclerosis ([place]) 07/10/2020  ? Stage 2 moderate COPD by GOLD classification ([place]) 05/16/2020  ? Dyspepsia and disorder of function of stomach 05/16/2020  ? Cervical disc disorder of mid-cervical region 05/10/2020  ? Polyp of descending colon   ? Mixed hyperlipidemia 10/28/2018  ? Atherosclerosis of native arteries of extremity with intermittent claudication ([place]) 07/13/2016  ? Tobacco use disorder 07/13/2016  ? Prediabetes 07/27/2015  ? Controlled gout 12/22/2014  ? Coronary artery disease involving native coronary artery of native heart with angina pectoris ([place]) 10/09/2014  ? Essential (primary) hypertension 10/09/2014  ? Dupuytren's disease of palm 10/09/2014  ? Psoriasis 10/09/2014  ? H/O cardiac catheterization 01/27/2014  ? Peripheral vascular disease ([place]) 01/27/2014  ? ? ?Allergies  ?Allergen Reactions  ? Contrast Media [Iodinated Contrast Media] Hives  ? Iodine Hives  ? ? ?Past Surgical History:  ?Procedure Laterality Date  ? APPENDECTOMY    ? cardiac stents   2012  ? COLONOSCOPY  03/30/2013  ? [place]  ? COLONOSCOPY WITH PROPOFOL N/A 12/10/2019  ? Procedure: COLONOSCOPY WITH PROPOFOL;  Surgeon: [name], MD;  Location: [place];  Service: Endoscopy;  Laterality: N/A;  ? COLONOSCOPY WITH PROPOFOL N/A 01/18/2020  ? Procedure: COLONOSCOPY WITH PROPOFOL;  Surgeon: [name], MD;  Location: [facility] [place];  Service: Endoscopy;  Laterality: N/A;  4  ? LEFT HEART CATH AND CORONARY ANGIOGRAPHY N/A 12/29/2020  ? Procedure: LEFT HEART CATH AND CORONARY ANGIOGRAPHY;  Surgeon: Corey Skains, MD;  Location: [facility];  Service: Cardiovascular;  Laterality: N/A;  ? LOWER EXTREMITY ANGIOGRAPHY Right 09/25/2018  ? Procedure: LOWER EXTREMITY ANGIOGRAPHY;  Surgeon: Algernon Huxley, MD;  Location: [facility];  Service: Cardiovascular;  Laterality:  Right;  ? LOWER EXTREMITY ANGIOGRAPHY Left 03/17/2020  ? Procedure: LOWER EXTREMITY ANGIOGRAPHY;  Surgeon: Algernon Huxley, MD;  Location: [facility] [facility];  Service: Cardiovascular;  Laterality: Left;  ? POLYPECTOMY N/A 01/18/2020  ? Procedure: POLYPECTOMY;  Surgeon: [name], MD;  Location: [place];  Service: Endoscopy;  Laterality: N/A;  ? ? ?Social History  ? ?Tobacco Use  ? Smoking status: Every Day  ?  Packs/day: 1.00  ?  Years: 46.00  ?  Pack years: 46.00  ?  Types: Cigarettes  ? Smokeless tobacco: Never  ? Tobacco comments:  ?  Pt down to 1/2 PPD 05/24/21  ?Vaping Use  ? Vaping Use: Never used  ?Substance Use Topics  ? Alcohol use: Not Currently  ?  Comment: quit drinking around 04/2019(May have drink 1x/mo)  ? Drug use: No  ? ? ? ?Medication list has been reviewed and updated. ? ?No outpatient medications have been marked as taking for the 09/28/21 encounter (Office Visit) with Glean Hess, MD.  ? ? ? ?  09/28/2021  ?  9:44 AM 08/17/2021  ?  9:42 AM 06/16/2021  ? 10:50 AM 05/24/2021  ?  3:31 PM  ?PHQ 2/9 Scores  ?PHQ - 2 Score 0 0 0 0  ?PHQ- 9 Score 0 0 0   ? ? ? ?  09/28/2021  ?  9:44 AM 08/17/2021  ?  9:42 AM 06/16/2021  ? 10:51 AM 04/05/2021  ?  9:23 AM  ?GAD 7 : Generalized Anxiety Score  ?Nervous, Anxious, on Edge 0 0 0 0  ?Control/stop worrying 0 0 0 0  ?Worry too much - different things 0 0 0 0  ?Trouble relaxing 0 0 0 0  ?Restless 0 0 0 0  ?Easily annoyed or irritable 0 0 0 0  ?Afraid - awful might happen 0 0 0 0  ?Total GAD 7 Score 0 0 0 0  ?Anxiety Difficulty Not difficult at all Not difficult at all Not difficult at all Not difficult at all  ? ? ?BP Readings from Last 3 Encounters:  ?09/28/21 122/76  ?08/17/21 128/74  ?08/11/21 (!) 145/82  ? ? ?Physical Exam ?Vitals and nursing note reviewed.  ?Constitutional:   ?   General: He is in acute distress.  ?   Appearance: He is well-developed.  ?HENT:  ?   Head: Normocephalic and atraumatic.  ?Cardiovascular:  ?   Rate and Rhythm: Normal rate  and regular rhythm.  ?Pulmonary:  ?   Effort: Pulmonary effort is normal. No respiratory distress.  ?   Breath sounds: No wheezing or rhonchi.  ?Musculoskeletal:  ?   Lumbar back: Spasms (right lower back) and tenderness present. No swelling or bony tenderness. Decreased range of motion. Positive right straight leg raise test. Negative left straight leg raise test.  ?Skin: ?   General: Skin is warm and dry.  ?   Findings: No rash.  ?Neurological:  ?   Mental Status: He is alert and oriented to person, place, and  time.  ?Psychiatric:     ?   Mood and Affect: Mood normal.     ?   Behavior: Behavior normal.  ? ? ?Wt Readings from Last 3 Encounters:  ?09/28/21 195 lb 6.4 oz (88.6 kg)  ?08/17/21 199 lb 3.2 oz (90.4 kg)  ?08/11/21 201 lb (91.2 kg)  ? ? ?BP 122/76   Pulse 76   Ht $R'6\' 1"'Hn$  (1.854 m)   Wt 195 lb 6.4 oz (88.6 kg)   SpO2 99%   BMI 25.78 kg/m?  ? ?Assessment and Plan: ?1. Muscle spasm of back ?Continue heat off and on throughout the day. ?Follow up if not improving in 5 days ?- meloxicam (MOBIC) 15 MG tablet; Take 1 tablet (15 mg total) by mouth daily.  Dispense: 30 tablet; Refill: 0 ?- cyclobenzaprine (FLEXERIL) 10 MG tablet; Take 1 tablet (10 mg total) by mouth 3 (three) times daily as needed for muscle spasms.  Dispense: 30 tablet; Refill: 0 ? ? ?Partially dictated using Editor, commissioning. Any errors are unintentional. ? ?Halina Maidens, MD ?[facility] ?[facility] ? ?09/28/2021 ? ? ? ? ? ?

## 2021-10-03 ENCOUNTER — Ambulatory Visit: Payer: PPO | Admitting: Internal Medicine

## 2021-10-06 ENCOUNTER — Ambulatory Visit (INDEPENDENT_AMBULATORY_CARE_PROVIDER_SITE_OTHER): Payer: PPO | Admitting: Internal Medicine

## 2021-10-06 ENCOUNTER — Encounter: Payer: Self-pay | Admitting: Internal Medicine

## 2021-10-06 ENCOUNTER — Ambulatory Visit: Payer: Self-pay

## 2021-10-06 VITALS — BP 120/76 | HR 75 | Ht 73.0 in | Wt 196.8 lb

## 2021-10-06 DIAGNOSIS — K5903 Drug induced constipation: Secondary | ICD-10-CM

## 2021-10-06 DIAGNOSIS — M62830 Muscle spasm of back: Secondary | ICD-10-CM | POA: Diagnosis not present

## 2021-10-06 NOTE — Telephone Encounter (Signed)
?  Chief Complaint: Abdominal bloating/pain/back pain/incomplete BM ?Symptoms: IBID ?Frequency: Since Tuesday ?Pertinent Negatives: Patient denies Fever extreme pain ?Disposition: '[]'$ ED /'[]'$ Urgent Care (no appt availability in office) / '[x]'$ Appointment(In office/virtual)/ '[]'$  [place] [facility]/ '[]'$ Home Care/ '[]'$ Refused Recommended Disposition /'[]'$ [facility]/ '[]'$  Follow-up with PCP ?Additional Notes: Pt had a back injury and now is having incomplete Bms with bloating and pain.  ?Reason for Disposition ? [1] MILD pain (e.g., does not interfere with normal activities) AND [2] pain comes and goes (cramps) [3] present > 48 hours  (Exception: this same abdominal pain is a chronic symptom recurrent or ongoing AND present > 4 weeks) ? ?Answer Assessment - Initial Assessment Questions ?1. LOCATION: "Where does it hurt?"  ?    Abdomen ?2. RADIATION: "Does the pain shoot anywhere else?" (e.g., chest, back) ?    no ?3. ONSET: "When did the pain begin?" (Minutes, hours or days ago)  ?    Tuesday the 28th ?4. SUDDEN: "Gradual or sudden onset?" ?    28th - gradual ?5. PATTERN "Does the pain come and go, or is it constant?" ?   - If constant: "Is it getting better, staying the same, or worsening?"  ?    (Note: Constant means the pain never goes away completely; most serious pain is constant and it progresses)  ?   - If intermittent: "How long does it last?" "Do you have pain now?" ?    (Note: Intermittent means the pain goes away completely between bouts) ?    constant ?6. SEVERITY: "How bad is the pain?"  (e.g., Scale 1-10; mild, moderate, or severe) ?   - MILD (1-3): doesn't interfere with normal activities, abdomen soft and not tender to touch  ?   - MODERATE (4-7): interferes with normal activities or awakens from sleep, abdomen tender to touch  ?   - SEVERE (8-10): excruciating pain, doubled over, unable to do any normal activities   ?    mild ?7. RECURRENT SYMPTOM: "Have you ever had this type of stomach pain  before?" If Yes, ask: "When was the last time?" and "What happened that time?"  ?    no ?8. CAUSE: "What do you think is causing the stomach pain?" ?    unsure ?9. RELIEVING/AGGRAVATING FACTORS: "What makes it better or worse?" (e.g., movement, antacids, bowel movement) ?    Tried ducolax - some relief ?10. OTHER SYMPTOMS: "Do you have any other symptoms?" (e.g., back pain, diarrhea, fever, urination pain, vomiting) ?      Back pain ? ?Protocols used: Abdominal Pain - Male-A-AH ? ?

## 2021-10-06 NOTE — Telephone Encounter (Signed)
Noted  ? ?Pt scheduled an appointment. ? ?KP ?

## 2021-10-06 NOTE — Progress Notes (Signed)
? ? ?Date:  10/06/2021  ? ?Name:  Aaron [name]   DOB:  1956/04/15   MRN:  [id number] ? ? ?Chief Complaint: Abdominal Pain (Bloating ) ? ?Abdominal Pain ?This is a new problem. Episode onset: 2 days. The onset quality is gradual. The pain is located in the right flank and generalized abdominal region. The pain is moderate. The quality of the pain is aching. Associated symptoms include constipation and myalgias. Pertinent negatives include no diarrhea, fever or nausea. Associated symptoms comments: And bloating - took Dulcolax last night and had a soft stool ?[name] The pain is relieved by Nothing. Treatments tried: laxative. The treatment provided no relief.  ?Back Pain ?This is a new problem. The current episode started in the past 7 days. Pain location: lumbar paraspinous muscles. The quality of the pain is described as aching and cramping. The pain is moderate. Associated symptoms include abdominal pain. Pertinent negatives include no chest pain or fever. He has tried muscle relaxant and NSAIDs for the symptoms.  ? ?Lab Results  ?Component Value Date  ? NA 134 04/05/2021  ? K 4.5 04/05/2021  ? CO2 22 04/05/2021  ? GLUCOSE 102 (H) 04/05/2021  ? BUN 15 04/05/2021  ? CREATININE 0.86 04/05/2021  ? CALCIUM 9.8 04/05/2021  ? EGFR 96 04/05/2021  ? GFRNONAA >60 12/28/2020  ? ?Lab Results  ?Component Value Date  ? CHOL 140 04/05/2021  ? HDL 36 (L) 04/05/2021  ? [place] 83 04/05/2021  ? TRIG 114 04/05/2021  ? CHOLHDL 3.9 04/05/2021  ? ?Lab Results  ?Component Value Date  ? TSH 3.214 02/06/2019  ? ?Lab Results  ?Component Value Date  ? HGBA1C 5.5 08/17/2021  ? ?Lab Results  ?Component Value Date  ? WBC 6.6 12/29/2020  ? HGB 16.6 12/29/2020  ? HCT 47.9 12/29/2020  ? MCV 96.8 12/29/2020  ? PLT 181 12/29/2020  ? ?Lab Results  ?Component Value Date  ? ALT 44 04/05/2021  ? AST 31 04/05/2021  ? ALKPHOS 77 04/05/2021  ? BILITOT 0.6 04/05/2021  ? ?No results found for: 25OHVITD2, [place], VD25OH  ? ?Review of Systems   ?Constitutional: Negative.  Negative for chills and fever.  ?HENT: Negative.    ?Eyes: Negative.   ?Respiratory: Negative.  Negative for chest tightness and shortness of breath.   ?Cardiovascular:  Negative for chest pain.  ?Gastrointestinal:  Positive for abdominal distention, abdominal pain and constipation. Negative for blood in stool, diarrhea, nausea and rectal pain.  ?Endocrine: Negative.   ?Genitourinary:  Positive for decreased urine volume and urgency.  ?Musculoskeletal:  Positive for back pain and myalgias.  ?Skin: Negative.   ? ?Patient Active Problem List  ? Diagnosis Date Noted  ? Unstable angina ([place]) 12/27/2020  ? Aortic atherosclerosis ([place]) 07/10/2020  ? Stage 2 moderate COPD by GOLD classification ([place]) 05/16/2020  ? Dyspepsia and disorder of function of stomach 05/16/2020  ? Cervical disc disorder of mid-cervical region 05/10/2020  ? Polyp of descending colon   ? Mixed hyperlipidemia 10/28/2018  ? Atherosclerosis of native arteries of extremity with intermittent claudication ([place]) 07/13/2016  ? Tobacco use disorder 07/13/2016  ? Prediabetes 07/27/2015  ? Controlled gout 12/22/2014  ? Coronary artery disease involving native coronary artery of native heart with angina pectoris ([place]) 10/09/2014  ? Essential (primary) hypertension 10/09/2014  ? Dupuytren's disease of palm 10/09/2014  ? Psoriasis 10/09/2014  ? H/O cardiac catheterization 01/27/2014  ? Peripheral vascular disease ([place]) 01/27/2014  ? ? ?Allergies  ?Allergen Reactions  ? Contrast  Media [Iodinated Contrast Media] Hives  ? Flexeril [Cyclobenzaprine] Other (See Comments)  ?  Urinary retention, constipation  ? Iodine Hives  ? ? ?Past Surgical History:  ?Procedure Laterality Date  ? APPENDECTOMY    ? cardiac stents   2012  ? COLONOSCOPY  03/30/2013  ? [place]  ? COLONOSCOPY WITH PROPOFOL N/A 12/10/2019  ? Procedure: COLONOSCOPY WITH PROPOFOL;  Surgeon: [name], MD;  Location: [facility];  Service: Endoscopy;  Laterality: N/A;  ?  COLONOSCOPY WITH PROPOFOL N/A 01/18/2020  ? Procedure: COLONOSCOPY WITH PROPOFOL;  Surgeon: [name], MD;  Location: [facility];  Service: Endoscopy;  Laterality: N/A;  4  ? LEFT HEART CATH AND CORONARY ANGIOGRAPHY N/A 12/29/2020  ? Procedure: LEFT HEART CATH AND CORONARY ANGIOGRAPHY;  Surgeon: Corey Skains, MD;  Location: [facility];  Service: Cardiovascular;  Laterality: N/A;  ? LOWER EXTREMITY ANGIOGRAPHY Right 09/25/2018  ? Procedure: LOWER EXTREMITY ANGIOGRAPHY;  Surgeon: Algernon Huxley, MD;  Location: [facility];  Service: Cardiovascular;  Laterality: Right;  ? LOWER EXTREMITY ANGIOGRAPHY Left 03/17/2020  ? Procedure: LOWER EXTREMITY ANGIOGRAPHY;  Surgeon: Algernon Huxley, MD;  Location: [facility];  Service: Cardiovascular;  Laterality: Left;  ? POLYPECTOMY N/A 01/18/2020  ? Procedure: POLYPECTOMY;  Surgeon: [name], MD;  Location: [facility];  Service: Endoscopy;  Laterality: N/A;  ? ? ?Social History  ? ?Tobacco Use  ? Smoking status: Every Day  ?  Packs/day: 1.00  ?  Years: 46.00  ?  Pack years: 46.00  ?  Types: Cigarettes  ? Smokeless tobacco: Never  ? Tobacco comments:  ?  Pt down to 1/2 PPD 05/24/21  ?Vaping Use  ? Vaping Use: Never used  ?Substance Use Topics  ? Alcohol use: Not Currently  ?  Comment: quit drinking around 04/2019(May have drink 1x/mo)  ? Drug use: No  ? ? ? ?Medication list has been reviewed and updated. ? ?Current Meds  ?Medication Sig  ? aspirin EC 81 MG tablet Take 1 tablet (81 mg total) by mouth daily.  ? cilostazol (PLETAL) 50 MG tablet Take by mouth 2 (two) times daily.   ? clopidogrel (PLAVIX) 75 MG tablet Take 1 tablet by mouth daily.  ? indomethacin (INDOCIN) 25 MG capsule Take 1 capsule (25 mg total) by mouth 2 (two) times daily as needed.  ? isosorbide mononitrate (IMDUR) 120 MG 24 hr tablet Take 120 mg by mouth daily.  ? lisinopril (PRINIVIL,ZESTRIL) 10 MG tablet Take 1 tablet by mouth daily.  ? metoprolol tartrate  (LOPRESSOR) 50 MG tablet Take 50-75 mg by mouth 2 (two) times a day. Take $RemoveB'50MG'mTukkiqJ$  by mouth every morning and $RemoveBef'75MG'ofrQluAChX$  every evening  ? nitroGLYCERIN (NITROSTAT) 0.4 MG SL tablet Place 1 tablet under the tongue as needed.  ? simvastatin (ZOCOR) 40 MG tablet Take 1 tablet (40 mg total) by mouth at bedtime.  ? [DISCONTINUED] cyclobenzaprine (FLEXERIL) 10 MG tablet Take 1 tablet (10 mg total) by mouth 3 (three) times daily as needed for muscle spasms.  ? ? ? ?  10/06/2021  ?  1:55 PM 09/28/2021  ?  9:44 AM 08/17/2021  ?  9:42 AM 06/16/2021  ? 10:51 AM  ?GAD 7 : Generalized Anxiety Score  ?Nervous, Anxious, on Edge 0 0 0 0  ?Control/stop worrying 0 0 0 0  ?Worry too much - different things 0 0 0 0  ?Trouble relaxing 0 0 0 0  ?Restless 0 0 0 0  ?  Easily annoyed or irritable 0 0 0 0  ?Afraid - awful might happen 0 0 0 0  ?Total GAD 7 Score 0 0 0 0  ?Anxiety Difficulty Not difficult at all Not difficult at all Not difficult at all Not difficult at all  ? ? ? ?  10/06/2021  ?  1:55 PM  ?Depression screen PHQ 2/9  ?Decreased Interest 0  ?Down, Depressed, Hopeless 0  ?PHQ - 2 Score 0  ?Altered sleeping 0  ?Tired, decreased energy 0  ?Change in appetite 0  ?Feeling bad or failure about yourself  0  ?Trouble concentrating 0  ?Moving slowly or fidgety/restless 0  ?Suicidal thoughts 0  ?PHQ-9 Score 0  ?Difficult doing work/chores Not difficult at all  ? ? ?BP Readings from Last 3 Encounters:  ?10/06/21 120/76  ?09/28/21 122/76  ?08/17/21 128/74  ? ? ?Physical Exam ?Vitals and nursing note reviewed.  ?Constitutional:   ?   General: He is not in acute distress. ?   Appearance: He is well-developed.  ?HENT:  ?   Head: Normocephalic and atraumatic.  ?Pulmonary:  ?   Effort: Pulmonary effort is normal. No respiratory distress.  ?Abdominal:  ?   General: Abdomen is flat. Bowel sounds are normal.  ?   Palpations: Abdomen is soft.  ?   Tenderness: There is no abdominal tenderness. There is no right CVA tenderness or left CVA tenderness.  ?   Hernia: No  hernia is present.  ?   Comments: Bladder soft, non distended ?  ?Skin: ?   General: Skin is warm and dry.  ?   Findings: No rash.  ?Neurological:  ?   Mental Status: He is alert and oriented to person, place, and time.  ?

## 2021-10-11 DIAGNOSIS — H2511 Age-related nuclear cataract, right eye: Secondary | ICD-10-CM | POA: Diagnosis not present

## 2021-10-17 ENCOUNTER — Encounter: Payer: Self-pay | Admitting: Ophthalmology

## 2021-10-18 ENCOUNTER — Ambulatory Visit (INDEPENDENT_AMBULATORY_CARE_PROVIDER_SITE_OTHER): Payer: PPO | Admitting: Internal Medicine

## 2021-10-18 ENCOUNTER — Encounter: Payer: Self-pay | Admitting: Internal Medicine

## 2021-10-18 ENCOUNTER — Ambulatory Visit
Admission: RE | Admit: 2021-10-18 | Discharge: 2021-10-18 | Disposition: A | Discharge status: Home / Self Care | Payer: PPO | Source: Ambulatory Visit | Attending: Internal Medicine | Admitting: Internal Medicine

## 2021-10-18 ENCOUNTER — Ambulatory Visit
Admission: RE | Admit: 2021-10-18 | Discharge: 2021-10-18 | Disposition: A | Discharge status: Home / Self Care | Payer: PPO | Attending: Internal Medicine | Admitting: Internal Medicine

## 2021-10-18 VITALS — BP 112/68 | HR 73 | Ht 73.0 in | Wt 195.8 lb

## 2021-10-18 DIAGNOSIS — M545 Low back pain, unspecified: Secondary | ICD-10-CM | POA: Diagnosis not present

## 2021-10-18 DIAGNOSIS — M2578 Osteophyte, vertebrae: Secondary | ICD-10-CM | POA: Diagnosis not present

## 2021-10-18 DIAGNOSIS — R194 Change in bowel habit: Secondary | ICD-10-CM

## 2021-10-18 MED ORDER — DICLOFENAC SODIUM 75 MG PO TBEC: 75.0000 mg | DELAYED_RELEASE_TABLET | Freq: Two times a day (BID) | ORAL | 0 refills | Status: DC

## 2021-10-18 NOTE — Patient Instructions (Signed)
Citrucil or Metamucil - fiber supplement daily. ?

## 2021-10-18 NOTE — Progress Notes (Signed)
? ? ?Date:  10/18/2021  ? ?Name:  Aaron [name]   DOB:  November 05, 1955   MRN:  [id number] ? ? ?Chief Complaint: Abdominal Pain and Back Pain ? ?Abdominal Pain ?This is a recurrent problem. The current episode started more than 1 month ago. The problem occurs every several days. The problem has been gradually improving. The pain is located in the suprapubic region and epigastric region. The pain is moderate. The quality of the pain is cramping and aching. Associated symptoms include diarrhea. Pertinent negatives include no constipation, fever, melena, nausea or vomiting. Associated symptoms comments: Rectal fullness, very soft - loose stools about once a day. Treatments tried: MOM. The treatment provided mild relief.  ?Back Pain ?This is a recurrent problem. The current episode started more than 1 month ago. The problem occurs constantly. The problem has been gradually improving since onset. The pain is present in the lumbar spine. The quality of the pain is described as aching. The symptoms are aggravated by lying down, bending and twisting. Associated symptoms include abdominal pain. Pertinent negatives include no chest pain or fever. He has tried heat, bed rest, muscle relaxant and NSAIDs for the symptoms. The treatment provided mild (mainly from heat) relief.  ? ?Lab Results  ?Component Value Date  ? NA 134 04/05/2021  ? K 4.5 04/05/2021  ? CO2 22 04/05/2021  ? GLUCOSE 102 (H) 04/05/2021  ? BUN 15 04/05/2021  ? CREATININE 0.86 04/05/2021  ? CALCIUM 9.8 04/05/2021  ? EGFR 96 04/05/2021  ? GFRNONAA >60 12/28/2020  ? ?Lab Results  ?Component Value Date  ? CHOL 140 04/05/2021  ? HDL 36 (L) 04/05/2021  ? [place] 83 04/05/2021  ? TRIG 114 04/05/2021  ? CHOLHDL 3.9 04/05/2021  ? ?Lab Results  ?Component Value Date  ? TSH 3.214 02/06/2019  ? ?Lab Results  ?Component Value Date  ? HGBA1C 5.5 08/17/2021  ? ?Lab Results  ?Component Value Date  ? WBC 6.6 12/29/2020  ? HGB 16.6 12/29/2020  ? HCT 47.9 12/29/2020  ? MCV 96.8  12/29/2020  ? PLT 181 12/29/2020  ? ?Lab Results  ?Component Value Date  ? ALT 44 04/05/2021  ? AST 31 04/05/2021  ? ALKPHOS 77 04/05/2021  ? BILITOT 0.6 04/05/2021  ? ?No results found for: 25OHVITD2, [place], VD25OH  ? ?Review of Systems  ?Constitutional:  Negative for appetite change, diaphoresis and fever.  ?Respiratory:  Negative for chest tightness and shortness of breath.   ?Cardiovascular:  Negative for chest pain and leg swelling.  ?Gastrointestinal:  Positive for abdominal pain and diarrhea. Negative for blood in stool, constipation, melena, nausea and vomiting.  ?Musculoskeletal:  Positive for back pain.  ?Psychiatric/Behavioral:  Negative for dysphoric mood and sleep disturbance. The patient is not nervous/anxious.   ? ?Patient Active Problem List  ? Diagnosis Date Noted  ? Unstable angina ([place]) 12/27/2020  ? Aortic atherosclerosis ([place]) 07/10/2020  ? Stage 2 moderate COPD by GOLD classification ([place]) 05/16/2020  ? Dyspepsia and disorder of function of stomach 05/16/2020  ? Cervical disc disorder of mid-cervical region 05/10/2020  ? Polyp of descending colon   ? Mixed hyperlipidemia 10/28/2018  ? Atherosclerosis of native arteries of extremity with intermittent claudication ([place]) 07/13/2016  ? Tobacco use disorder 07/13/2016  ? Prediabetes 07/27/2015  ? Controlled gout 12/22/2014  ? Coronary artery disease involving native coronary artery of native heart with angina pectoris ([place]) 10/09/2014  ? Essential (primary) hypertension 10/09/2014  ? Dupuytren's disease of palm 10/09/2014  ? Psoriasis  10/09/2014  ? H/O cardiac catheterization 01/27/2014  ? Peripheral vascular disease ([place]) 01/27/2014  ? ? ?Allergies  ?Allergen Reactions  ? Contrast Media [Iodinated Contrast Media] Hives  ? Flexeril [Cyclobenzaprine] Other (See Comments)  ?  Urinary retention, constipation  ? Iodine Hives  ? ? ?Past Surgical History:  ?Procedure Laterality Date  ? APPENDECTOMY    ? cardiac stents   2012  ? COLONOSCOPY  03/30/2013   ? [place]  ? COLONOSCOPY WITH PROPOFOL N/A 12/10/2019  ? Procedure: COLONOSCOPY WITH PROPOFOL;  Surgeon: [name], MD;  Location: [facility];  Service: Endoscopy;  Laterality: N/A;  ? COLONOSCOPY WITH PROPOFOL N/A 01/18/2020  ? Procedure: COLONOSCOPY WITH PROPOFOL;  Surgeon: [name], MD;  Location: [facility];  Service: Endoscopy;  Laterality: N/A;  4  ? LEFT HEART CATH AND CORONARY ANGIOGRAPHY N/A 12/29/2020  ? Procedure: LEFT HEART CATH AND CORONARY ANGIOGRAPHY;  Surgeon: Corey Skains, MD;  Location: [facility];  Service: Cardiovascular;  Laterality: N/A;  ? LOWER EXTREMITY ANGIOGRAPHY Right 09/25/2018  ? Procedure: LOWER EXTREMITY ANGIOGRAPHY;  Surgeon: Algernon Huxley, MD;  Location: [facility];  Service: Cardiovascular;  Laterality: Right;  ? LOWER EXTREMITY ANGIOGRAPHY Left 03/17/2020  ? Procedure: LOWER EXTREMITY ANGIOGRAPHY;  Surgeon: Algernon Huxley, MD;  Location: [facility];  Service: Cardiovascular;  Laterality: Left;  ? POLYPECTOMY N/A 01/18/2020  ? Procedure: POLYPECTOMY;  Surgeon: [name], MD;  Location: [facility];  Service: Endoscopy;  Laterality: N/A;  ? ? ?Social History  ? ?Tobacco Use  ? Smoking status: Every Day  ?  Packs/day: 1.00  ?  Years: 46.00  ?  Pack years: 46.00  ?  Types: Cigarettes  ? Smokeless tobacco: Never  ? Tobacco comments:  ?  Pt down to 1/2 PPD 05/24/21  ?Vaping Use  ? Vaping Use: Never used  ?Substance Use Topics  ? Alcohol use: Yes  ?  Comment: quit drinking around 04/2019(May have drink 1x/mo)  ? Drug use: No  ? ? ? ?Medication list has been reviewed and updated. ? ?Current Meds  ?Medication Sig  ? albuterol (VENTOLIN HFA) 108 (90 Base) MCG/ACT inhaler Inhale 2 puffs into the lungs every 6 (six) hours as needed for wheezing or shortness of breath.  ? aspirin EC 81 MG tablet Take 1 tablet (81 mg total) by mouth daily. (Patient taking differently: Take 325 mg by mouth daily.)  ? cilostazol (PLETAL) 50 MG tablet  Take by mouth 2 (two) times daily.   ? clopidogrel (PLAVIX) 75 MG tablet Take 1 tablet by mouth daily.  ? indomethacin (INDOCIN) 25 MG capsule Take 1 capsule (25 mg total) by mouth 2 (two) times daily as needed.  ? isosorbide mononitrate (IMDUR) 120 MG 24 hr tablet Take 120 mg by mouth daily.  ? lisinopril (PRINIVIL,ZESTRIL) 10 MG tablet Take 1 tablet by mouth daily.  ? meloxicam (MOBIC) 15 MG tablet Take 1 tablet (15 mg total) by mouth daily.  ? metoprolol tartrate (LOPRESSOR) 50 MG tablet Take 50-75 mg by mouth 2 (two) times a day. Take $RemoveB'50MG'OwWKXVJF$  by mouth every morning and $RemoveBef'75MG'NliLJBqRAN$  every evening  ? nitroGLYCERIN (NITROSTAT) 0.4 MG SL tablet Place 1 tablet under the tongue as needed.  ? simvastatin (ZOCOR) 40 MG tablet Take 1 tablet (40 mg total) by mouth at bedtime.  ? ? ? ?  10/18/2021  ? 11:18 AM 10/06/2021  ?  1:55 PM 09/28/2021  ?  9:44 AM 08/17/2021  ?  9:42 AM  ?GAD 7 : Generalized Anxiety Score  ?Nervous, Anxious, on Edge 0 0 0 0  ?Control/stop worrying 0 0 0 0  ?Worry too much - different things 0 0 0 0  ?Trouble relaxing 0 0 0 0  ?Restless 0 0 0 0  ?Easily annoyed or irritable 0 0 0 0  ?Afraid - awful might happen 0 0 0 0  ?Total GAD 7 Score 0 0 0 0  ?Anxiety Difficulty Not difficult at all Not difficult at all Not difficult at all Not difficult at all  ? ? ? ?  10/18/2021  ? 11:18 AM  ?Depression screen PHQ 2/9  ?Decreased Interest 0  ?Down, Depressed, Hopeless 0  ?PHQ - 2 Score 0  ?Altered sleeping 0  ?Tired, decreased energy 0  ?Change in appetite 0  ?Feeling bad or failure about yourself  0  ?Trouble concentrating 0  ?Moving slowly or fidgety/restless 0  ?Suicidal thoughts 0  ?PHQ-9 Score 0  ?Difficult doing work/chores Not difficult at all  ? ? ?BP Readings from Last 3 Encounters:  ?10/18/21 138/60  ?10/06/21 120/76  ?09/28/21 122/76  ? ? ?Physical Exam ?Vitals and nursing note reviewed.  ?Constitutional:   ?   General: He is not in acute distress. ?   Appearance: He is well-developed.  ?HENT:  ?   Head:  Normocephalic and atraumatic.  ?Pulmonary:  ?   Effort: Pulmonary effort is normal. No respiratory distress.  ?Abdominal:  ?   General: Bowel sounds are normal.  ?   Palpations: Abdomen is soft. There is no hepatome

## 2021-10-24 NOTE — Discharge Instructions (Signed)

## 2021-10-26 ENCOUNTER — Ambulatory Visit: Payer: PPO | Admitting: Anesthesiology

## 2021-10-26 ENCOUNTER — Ambulatory Visit
Admission: RE | Admit: 2021-10-26 | Discharge: 2021-10-26 | Disposition: A | Discharge status: Home / Self Care | Payer: PPO | Attending: Ophthalmology | Admitting: Ophthalmology

## 2021-10-26 ENCOUNTER — Encounter: Payer: Self-pay | Admitting: Ophthalmology

## 2021-10-26 ENCOUNTER — Other Ambulatory Visit: Payer: Self-pay

## 2021-10-26 ENCOUNTER — Encounter
Admission: RE | Disposition: A | Discharge status: Home / Self Care | Payer: Self-pay | Source: Home / Self Care | Attending: Ophthalmology

## 2021-10-26 DIAGNOSIS — I2510 Atherosclerotic heart disease of native coronary artery without angina pectoris: Secondary | ICD-10-CM | POA: Insufficient documentation

## 2021-10-26 DIAGNOSIS — I10 Essential (primary) hypertension: Secondary | ICD-10-CM | POA: Insufficient documentation

## 2021-10-26 DIAGNOSIS — J449 Chronic obstructive pulmonary disease, unspecified: Secondary | ICD-10-CM | POA: Insufficient documentation

## 2021-10-26 DIAGNOSIS — I739 Peripheral vascular disease, unspecified: Secondary | ICD-10-CM | POA: Insufficient documentation

## 2021-10-26 DIAGNOSIS — E785 Hyperlipidemia, unspecified: Secondary | ICD-10-CM | POA: Diagnosis not present

## 2021-10-26 DIAGNOSIS — F17210 Nicotine dependence, cigarettes, uncomplicated: Secondary | ICD-10-CM | POA: Insufficient documentation

## 2021-10-26 DIAGNOSIS — H2511 Age-related nuclear cataract, right eye: Secondary | ICD-10-CM | POA: Diagnosis not present

## 2021-10-26 DIAGNOSIS — Z955 Presence of coronary angioplasty implant and graft: Secondary | ICD-10-CM | POA: Diagnosis not present

## 2021-10-26 DIAGNOSIS — H25811 Combined forms of age-related cataract, right eye: Secondary | ICD-10-CM | POA: Diagnosis not present

## 2021-10-26 HISTORY — PX: CATARACT EXTRACTION W/PHACO: SHX586

## 2021-10-26 SURGERY — PHACOEMULSIFICATION, CATARACT, WITH IOL INSERTION
Anesthesia: Monitor Anesthesia Care | Site: Eye | Laterality: Right

## 2021-10-26 MED ORDER — TETRACAINE 0.5 % OP SOLN OPTIME - NO CHARGE
OPHTHALMIC | Status: DC | PRN
Start: 2021-10-26 — End: 2021-10-26
  Administered 2021-10-26: 1 [drp] via OPHTHALMIC

## 2021-10-26 MED ORDER — SIGHTPATH DOSE#1 BSS IO SOLN
INTRAOCULAR | Status: DC | PRN
  Administered 2021-10-26: 15 mL

## 2021-10-26 MED ORDER — ACETAMINOPHEN 160 MG/5ML PO SOLN: 325.0000 mg | ORAL | Status: DC | PRN

## 2021-10-26 MED ORDER — BRIMONIDINE TARTRATE-TIMOLOL 0.2-0.5 % OP SOLN
OPHTHALMIC | Status: DC | PRN
  Administered 2021-10-26: 1 [drp] via OPHTHALMIC

## 2021-10-26 MED ORDER — MOXIFLOXACIN HCL 0.5 % OP SOLN
OPHTHALMIC | Status: DC | PRN
  Administered 2021-10-26: 0.2 mL via OPHTHALMIC

## 2021-10-26 MED ORDER — MIDAZOLAM HCL 2 MG/2ML IJ SOLN
INTRAMUSCULAR | Status: DC | PRN
  Administered 2021-10-26: 2 mg via INTRAVENOUS

## 2021-10-26 MED ORDER — PHENYLEPHRINE-KETOROLAC 1-0.3 % IO SOLN
INTRAOCULAR | Status: DC | PRN
Start: 2021-10-26 — End: 2021-10-26
  Administered 2021-10-26: 101 mL via OPHTHALMIC

## 2021-10-26 MED ORDER — ONDANSETRON HCL 4 MG/2ML IJ SOLN: 4.0000 mg | Freq: Once | INTRAMUSCULAR | Status: DC | PRN

## 2021-10-26 MED ORDER — ACETAMINOPHEN 325 MG PO TABS: 325.0000 mg | ORAL_TABLET | ORAL | Status: DC | PRN

## 2021-10-26 MED ORDER — ARMC OPHTHALMIC DILATING DROPS
1.0000 "application " | OPHTHALMIC | Status: DC | PRN
  Administered 2021-10-26 (×3): 1 via OPHTHALMIC

## 2021-10-26 MED ORDER — EPINEPHRINE PF 1 MG/ML IJ SOLN
INTRAMUSCULAR | Status: DC | PRN
Start: 2021-10-26 — End: 2021-10-26
  Administered 2021-10-26: 1 mL via INTRAOCULAR

## 2021-10-26 MED ORDER — SIGHTPATH DOSE#1 NA HYALUR & NA CHOND-NA HYALUR IO KIT
PACK | INTRAOCULAR | Status: DC | PRN
Start: 2021-10-26 — End: 2021-10-26
  Administered 2021-10-26: 1 via OPHTHALMIC

## 2021-10-26 MED ORDER — FENTANYL CITRATE (PF) 100 MCG/2ML IJ SOLN
INTRAMUSCULAR | Status: DC | PRN
  Administered 2021-10-26 (×2): 50 ug via INTRAVENOUS

## 2021-10-26 MED ORDER — TETRACAINE HCL 0.5 % OP SOLN
1.0000 [drp] | OPHTHALMIC | Status: DC | PRN
  Administered 2021-10-26 (×3): 1 [drp] via OPHTHALMIC

## 2021-10-26 SURGICAL SUPPLY — 15 items
CATARACT SUITE SIGHTPATH (MISCELLANEOUS) ×2 IMPLANT
DISSECTOR HYDRO NUCLEUS 50X22 (MISCELLANEOUS) ×2 IMPLANT
DRSG TEGADERM 2-3/8X2-3/4 SM (GAUZE/BANDAGES/DRESSINGS) ×2 IMPLANT
FEE CATARACT SUITE SIGHTPATH (MISCELLANEOUS) ×1 IMPLANT
GLOVE SURG GAMMEX PI TX LF 7.5 (GLOVE) ×2 IMPLANT
GLOVE SURG SYN 8.5  E (GLOVE) ×1
GLOVE SURG SYN 8.5 E (GLOVE) ×1 IMPLANT
GLOVE SURG SYN 8.5 PF PI (GLOVE) ×1 IMPLANT
LENS IOL TECNIS EYHANCE 28.0 (Intraocular Lens) ×1 IMPLANT
NDL FILTER BLUNT 18X1 1/2 (NEEDLE) ×1 IMPLANT
NEEDLE FILTER BLUNT 18X 1/2SAF (NEEDLE) ×1
NEEDLE FILTER BLUNT 18X1 1/2 (NEEDLE) ×1 IMPLANT
SYR 3ML LL SCALE MARK (SYRINGE) ×2 IMPLANT
SYR 5ML LL (SYRINGE) ×2 IMPLANT
WATER STERILE IRR 250ML POUR (IV SOLUTION) ×2 IMPLANT

## 2021-10-26 NOTE — Anesthesia Postprocedure Evaluation (Signed)
Anesthesia Post Note ? ?Patient: Aaron [name] ? ?Procedure(s) Performed: CATARACT EXTRACTION PHACO AND INTRAOCULAR LENS PLACEMENT (IOC) RIGHT OMIDRIA  10.98 01:18.2 (Right: Eye) ? ? ?  ?Patient location during evaluation: PACU ?Anesthesia Type: MAC ?Level of consciousness: awake ?Pain management: pain level controlled ?Vital Signs Assessment: post-procedure vital signs reviewed and stable ?Respiratory status: respiratory function stable ?Cardiovascular status: stable ?Postop Assessment: no apparent nausea or vomiting ?Anesthetic complications: no ? ? ?No notable events documented. ? ?Veda Canning ? ? ? ? ? ?

## 2021-10-26 NOTE — Transfer of Care (Signed)
Immediate Anesthesia Transfer of Care Note ? ?Patient: Aaron [name] ? ?Procedure(s) Performed: CATARACT EXTRACTION PHACO AND INTRAOCULAR LENS PLACEMENT (IOC) RIGHT OMIDRIA  10.98 01:18.2 (Right: Eye) ? ?Patient Location: PACU ? ?Anesthesia Type: MAC ? ?Level of Consciousness: awake, alert  and patient cooperative ? ?Airway and Oxygen Therapy: Patient Spontanous Breathing and Patient connected to supplemental oxygen ? ?Post-op Assessment: Post-op Vital signs reviewed, Patient's Cardiovascular Status Stable, Respiratory Function Stable, Patent Airway and No signs of Nausea or vomiting ? ?Post-op Vital Signs: Reviewed and stable ? ?Complications: No notable events documented. ? ?

## 2021-10-26 NOTE — Anesthesia Preprocedure Evaluation (Signed)
Anesthesia Evaluation  ?Patient identified by MRN, date of birth, ID band ?Patient awake ? ? ? ?Reviewed: ?Allergy & Precautions, NPO status  ? ?Airway ?Mallampati: II ? ?TM Distance: >3 FB ? ? ? ? Dental ?  ?Pulmonary ?COPD, Current SmokerPatient did not abstain from smoking.,  ?  ?Pulmonary exam normal ? ? ? ? ? ? ? Cardiovascular ?hypertension, + CAD, + Cardiac Stents (2018) and + Peripheral Vascular Disease  ? ?Rhythm:Regular Rate:Normal ? ?HLD ?  ?Neuro/Psych ?  ? GI/Hepatic ?  ?Endo/Other  ? ? Renal/GU ?  ? ?  ?Musculoskeletal ?Gout  ? Abdominal ?  ?Peds ? Hematology ?  ?Anesthesia Other Findings ? ? Reproductive/Obstetrics ? ?  ? ? ? ? ? ? ? ? ? ? ? ? ? ?  ?  ? ? ? ? ? ? ? ? ?Anesthesia Physical ?Anesthesia Plan ? ?ASA: 3 ? ?Anesthesia Plan: MAC  ? ?Post-op Pain Management: Minimal or no pain anticipated  ? ?Induction: Intravenous ? ?PONV Risk Score and Plan: TIVA, Midazolam and Treatment may vary due to age or medical condition ? ?Airway Management Planned: Natural Airway and Nasal Cannula ? ?Additional Equipment:  ? ?Intra-op Plan:  ? ?Post-operative Plan:  ? ?Informed Consent: I have reviewed the patients History and Physical, chart, labs and discussed the procedure including the risks, benefits and alternatives for the proposed anesthesia with the patient or authorized representative who has indicated his/her understanding and acceptance.  ? ? ? ? ? ?Plan Discussed with: CRNA ? ?Anesthesia Plan Comments:   ? ? ? ? ? ? ?Anesthesia Quick Evaluation ? ?

## 2021-10-26 NOTE — Op Note (Signed)
OPERATIVE NOTE ? ?Toan Mort Vessel ?[id number] ?10/26/2021 ? ? ?PREOPERATIVE DIAGNOSIS: Nuclear sclerotic cataract right eye. H25.11 ?  ?POSTOPERATIVE DIAGNOSIS: Nuclear sclerotic cataract right eye. H25.11 ?  ?PROCEDURE:  Phacoemusification with posterior chamber intraocular lens placement of the right eye  ?Ultrasound time: Procedure(s): ?CATARACT EXTRACTION PHACO AND INTRAOCULAR LENS PLACEMENT (IOC) RIGHT OMIDRIA  10.98 01:18.2 (Right) ? ?LENS:   ?Implant Name Type Inv. Item Serial No. Manufacturer Lot No. LRB No. Used Action  ?LENS IOL TECNIS EYHANCE 28.0 - [id number] Intraocular Lens LENS IOL TECNIS EYHANCE 28.0 [id number] [employer]  Right 1 Implanted  ?   ? ?SURGEON:  Courtney Heys. Lazarus Salines, MD ?  ?ANESTHESIA:  Topical with tetracaine drops, augmented with 1% preservative-free intracameral lidocaine. ?  ?COMPLICATIONS:  None. ?  ?DESCRIPTION OF PROCEDURE:  The patient was identified in the holding room and transported to the operating room and placed in the supine position under the operating microscope.  The right eye was identified as the operative eye, which was prepped and draped in the usual sterile ophthalmic fashion. ?  ?A 1 millimeter clear-corneal paracentesis was made superotemporally. Preservative-free 1% lidocaine mixed with 1:1,000 bisulfite-free aqueous solution of epinephrine was injected into the anterior chamber. The anterior chamber was then filled with Viscoat viscoelastic. A 2.4 millimeter keratome was used to make a clear-corneal incision inferotemporally. A curvilinear capsulorrhexis was made with a cystotome and capsulorrhexis forceps. Balanced salt solution was used to hydrodissect and hydrodelineate the nucleus. Phacoemulsification was then used to remove the lens nucleus and epinucleus. The remaining cortex was then removed using the irrigation and aspiration handpiece. Provisc was then placed into the capsular bag to distend it for lens placement. A +28.00 DIB00 intraocular lens was then  injected into the capsular bag. The remaining viscoelastic was aspirated. ?  ?Wounds were hydrated with balanced salt solution.  The anterior chamber was inflated to a physiologic pressure with balanced salt solution.  No wound leaks were noted. Vigamox was injected intracamerally.  Timolol and Brimonidine drops were applied to the eye.  The patient was taken to the recovery room in stable condition without complications of anesthesia or surgery. ? ?[name] ?10/26/2021, 1:28 PM  ?

## 2021-10-26 NOTE — Anesthesia Postprocedure Evaluation (Deleted)
Anesthesia Post Note ? ?Patient: Aaron [name] ? ?Procedure(s) Performed: CATARACT EXTRACTION PHACO AND INTRAOCULAR LENS PLACEMENT (IOC) RIGHT OMIDRIA  10.98 01:18.2 (Right: Eye) ? ? ?  ?Patient location during evaluation: PACU ?Anesthesia Type: MAC ?Level of consciousness: awake ?Pain management: pain level controlled ?Vital Signs Assessment: post-procedure vital signs reviewed and stable ?Respiratory status: respiratory function stable ?Cardiovascular status: stable ?Postop Assessment: no apparent nausea or vomiting ?Anesthetic complications: no ? ? ?No notable events documented. ? ?Mckinze Poirier ? ? ? ? ? ?

## 2021-10-26 NOTE — H&P (Signed)
[place]  ? ?Primary Care Physician:  Glean Hess, MD ?Ophthalmologist: Dr. Merleen Nicely ? ?Pre-Procedure History & Physical: ?HPI:  Aaron [name] is a 66 y.o. male here for cataract surgery. ?  ?Past Medical History:  ?Diagnosis Date  ? COPD (chronic obstructive pulmonary disease) ([place])   ? Coronary artery disease   ? s/p PCI  ? Fracture of left ankle, closed, initial encounter 10/23/2017  ? GERD (gastroesophageal reflux disease)   ? Glaucoma   ? Gout   ? Hx of heart artery stent 01/12/2017  ? Hypercholesteremia   ? Hypertension   ? PAD (peripheral artery disease) ([place])   ? Pain in limb 07/13/2016  ? Syncope 09/16/2017  ? Unstable angina ([place]) 01/12/2017  ? Vertigo   ? ? ?Past Surgical History:  ?Procedure Laterality Date  ? APPENDECTOMY    ? cardiac stents   2012  ? COLONOSCOPY  03/30/2013  ? [place]  ? COLONOSCOPY WITH PROPOFOL N/A 12/10/2019  ? Procedure: COLONOSCOPY WITH PROPOFOL;  Surgeon: [name], MD;  Location: [facility];  Service: Endoscopy;  Laterality: N/A;  ? COLONOSCOPY WITH PROPOFOL N/A 01/18/2020  ? Procedure: COLONOSCOPY WITH PROPOFOL;  Surgeon: [name], MD;  Location: [facility];  Service: Endoscopy;  Laterality: N/A;  4  ? LEFT HEART CATH AND CORONARY ANGIOGRAPHY N/A 12/29/2020  ? Procedure: LEFT HEART CATH AND CORONARY ANGIOGRAPHY;  Surgeon: Corey Skains, MD;  Location: [facility];  Service: Cardiovascular;  Laterality: N/A;  ? LOWER EXTREMITY ANGIOGRAPHY Right 09/25/2018  ? Procedure: LOWER EXTREMITY ANGIOGRAPHY;  Surgeon: Algernon Huxley, MD;  Location: [facility];  Service: Cardiovascular;  Laterality: Right;  ? LOWER EXTREMITY ANGIOGRAPHY Left 03/17/2020  ? Procedure: LOWER EXTREMITY ANGIOGRAPHY;  Surgeon: Algernon Huxley, MD;  Location: [facility];  Service: Cardiovascular;  Laterality: Left;  ? POLYPECTOMY N/A 01/18/2020  ? Procedure: POLYPECTOMY;  Surgeon: [name], MD;  Location: [facility];  Service: Endoscopy;   Laterality: N/A;  ? ? ?Prior to Admission medications   ?Medication Sig Start Date End Date Taking? Authorizing Provider  ?albuterol (VENTOLIN HFA) 108 (90 Base) MCG/ACT inhaler Inhale 2 puffs into the lungs every 6 (six) hours as needed for wheezing or shortness of breath. 02/20/19  Yes Glean Hess, MD  ?aspirin EC 81 MG tablet Take 1 tablet (81 mg total) by mouth daily. ?Patient taking differently: Take 325 mg by mouth daily. 03/17/20  Yes Dew, Erskine Squibb, MD  ?cilostazol (PLETAL) 50 MG tablet Take by mouth 2 (two) times daily.  07/15/19  Yes [provider]  ?clopidogrel (PLAVIX) 75 MG tablet Take 1 tablet by mouth daily.   Yes [provider]  ?diclofenac (VOLTAREN) 75 MG EC tablet Take 1 tablet (75 mg total) by mouth 2 (two) times daily. 10/18/21  Yes Glean Hess, MD  ?indomethacin (INDOCIN) 25 MG capsule Take 1 capsule (25 mg total) by mouth 2 (two) times daily as needed. 04/05/21  Yes Glean Hess, MD  ?isosorbide mononitrate (IMDUR) 120 MG 24 hr tablet Take 120 mg by mouth daily. 11/17/19  Yes [provider]  ?lisinopril (PRINIVIL,ZESTRIL) 10 MG tablet Take 1 tablet by mouth daily.   Yes [provider]  ?metoprolol tartrate (LOPRESSOR) 50 MG tablet Take 50-75 mg by mouth 2 (two) times a day. Take '50MG'$  by mouth every morning and '75MG'$  every evening   Yes [provider]  ?simvastatin (ZOCOR) 40 MG tablet Take  1 tablet (40 mg total) by mouth at bedtime. 05/21/15  Yes Glean Hess, MD  ?nitroGLYCERIN (NITROSTAT) 0.4 MG SL tablet Place 1 tablet under the tongue as needed.    [provider]  ? ? ?Allergies as of 09/26/2021 - Review Complete 08/17/2021  ?Allergen Reaction Noted  ? Contrast media [iodinated contrast media] Hives 11/09/2019  ? Iodine Hives 11/09/2019  ? ? ?Family History  ?Problem Relation Age of Onset  ? Hypertension Mother   ? Diabetes Mother   ? Heart failure Father   ?     age 58  ? Cancer Father   ? Ovarian cancer Sister   ?  Heart disease Brother   ? ? ?Social History  ? ?Socioeconomic History  ? Marital status: Single  ?  Spouse name: Not on file  ? Number of children: 0  ? Years of education: Not on file  ? Highest education level: 12th grade  ?Occupational History  ? Occupation: Retired/Disabled  ?Tobacco Use  ? Smoking status: Every Day  ?  Packs/day: 1.00  ?  Years: 46.00  ?  Pack years: 46.00  ?  Types: Cigarettes  ? Smokeless tobacco: Never  ? Tobacco comments:  ?  Pt down to 1/2 PPD 05/24/21  ?Vaping Use  ? Vaping Use: Never used  ?Substance and Sexual Activity  ? Alcohol use: Yes  ?  Comment: quit drinking around 04/2019(May have drink 1x/mo)  ? Drug use: No  ? Sexual activity: Not Currently  ?  Birth control/protection: None  ?Other Topics Concern  ? Not on file  ?Social History Narrative  ? Independent at baseline. Lives alone. Does not drive.   ? ?Social Determinants of Health  ? ?Financial Resource Strain: Low Risk   ? Difficulty of Paying Living Expenses: Not hard at all  ?Food Insecurity: No Food Insecurity  ? Worried About Charity fundraiser in the Last Year: Never true  ? Ran Out of Food in the Last Year: Never true  ?Transportation Needs: No Transportation Needs  ? Lack of Transportation (Medical): No  ? Lack of Transportation (Non-Medical): No  ?Physical Activity: Sufficiently Active  ? Days of Exercise per Week: 7 days  ? Minutes of Exercise per Session: 90 min  ?Stress: No Stress Concern Present  ? Feeling of Stress : Not at all  ?Social Connections: Socially Isolated  ? Frequency of Communication with Friends and Family: More than three times a week  ? Frequency of Social Gatherings with Friends and Family: More than three times a week  ? Attends Religious Services: Never  ? Active Member of Clubs or Organizations: No  ? Attends Archivist Meetings: Never  ? Marital Status: Never married  ?Intimate Partner Violence: Not At Risk  ? Fear of Current or Ex-Partner: No  ? Emotionally Abused: No  ? Physically  Abused: No  ? Sexually Abused: No  ? ? ?Review of Systems: ?See HPI, otherwise negative ROS ? ?Physical Exam: ?BP 140/71   Pulse 68   Temp 97.7 ?F (36.5 ?C)   Ht '6\' 1"'$  (1.854 m)   Wt 87.5 kg   SpO2 99%   BMI 25.46 kg/m?  ?General:   Alert, cooperative in NAD ?Head:  Normocephalic and atraumatic. ?Respiratory:  Normal work of breathing. ?Cardiovascular:  RRR ? ?Impression/Plan: ?TRAMEL WESTBROOK is here for cataract surgery. ? ?Risks, benefits, limitations, and alternatives regarding cataract surgery have been reviewed with the patient.  Questions have been answered.  All parties agreeable. ? ? ?[name], MD  10/26/2021, 12:45 PM ? ? ?

## 2021-10-30 ENCOUNTER — Encounter: Payer: Self-pay | Admitting: Ophthalmology

## 2021-11-02 DIAGNOSIS — H2512 Age-related nuclear cataract, left eye: Secondary | ICD-10-CM | POA: Diagnosis not present

## 2021-11-13 DIAGNOSIS — E785 Hyperlipidemia, unspecified: Secondary | ICD-10-CM | POA: Diagnosis not present

## 2021-11-13 DIAGNOSIS — Z86718 Personal history of other venous thrombosis and embolism: Secondary | ICD-10-CM | POA: Diagnosis not present

## 2021-11-13 DIAGNOSIS — I739 Peripheral vascular disease, unspecified: Secondary | ICD-10-CM | POA: Diagnosis not present

## 2021-11-13 DIAGNOSIS — Z7902 Long term (current) use of antithrombotics/antiplatelets: Secondary | ICD-10-CM | POA: Diagnosis not present

## 2021-11-13 DIAGNOSIS — F17200 Nicotine dependence, unspecified, uncomplicated: Secondary | ICD-10-CM | POA: Diagnosis not present

## 2021-11-13 DIAGNOSIS — I10 Essential (primary) hypertension: Secondary | ICD-10-CM | POA: Diagnosis not present

## 2021-11-14 NOTE — Discharge Instructions (Signed)

## 2021-11-16 ENCOUNTER — Other Ambulatory Visit: Payer: Self-pay

## 2021-11-16 ENCOUNTER — Ambulatory Visit
Admission: RE | Admit: 2021-11-16 | Discharge: 2021-11-16 | Disposition: A | Discharge status: Home / Self Care | Payer: PPO | Attending: Ophthalmology | Admitting: Ophthalmology

## 2021-11-16 ENCOUNTER — Encounter: Payer: Self-pay | Admitting: Ophthalmology

## 2021-11-16 ENCOUNTER — Ambulatory Visit: Payer: PPO | Admitting: Anesthesiology

## 2021-11-16 ENCOUNTER — Encounter
Admission: RE | Disposition: A | Discharge status: Home / Self Care | Payer: Self-pay | Source: Home / Self Care | Attending: Ophthalmology

## 2021-11-16 DIAGNOSIS — E785 Hyperlipidemia, unspecified: Secondary | ICD-10-CM | POA: Insufficient documentation

## 2021-11-16 DIAGNOSIS — I10 Essential (primary) hypertension: Secondary | ICD-10-CM | POA: Diagnosis not present

## 2021-11-16 DIAGNOSIS — J449 Chronic obstructive pulmonary disease, unspecified: Secondary | ICD-10-CM | POA: Insufficient documentation

## 2021-11-16 DIAGNOSIS — I2510 Atherosclerotic heart disease of native coronary artery without angina pectoris: Secondary | ICD-10-CM | POA: Insufficient documentation

## 2021-11-16 DIAGNOSIS — Z955 Presence of coronary angioplasty implant and graft: Secondary | ICD-10-CM | POA: Insufficient documentation

## 2021-11-16 DIAGNOSIS — H25812 Combined forms of age-related cataract, left eye: Secondary | ICD-10-CM | POA: Diagnosis not present

## 2021-11-16 DIAGNOSIS — H2512 Age-related nuclear cataract, left eye: Secondary | ICD-10-CM | POA: Diagnosis not present

## 2021-11-16 DIAGNOSIS — I739 Peripheral vascular disease, unspecified: Secondary | ICD-10-CM | POA: Diagnosis not present

## 2021-11-16 HISTORY — PX: CATARACT EXTRACTION W/PHACO: SHX586

## 2021-11-16 SURGERY — PHACOEMULSIFICATION, CATARACT, WITH IOL INSERTION
Anesthesia: Monitor Anesthesia Care | Site: Eye | Laterality: Left

## 2021-11-16 MED ORDER — SIGHTPATH DOSE#1 BSS IO SOLN
INTRAOCULAR | Status: DC | PRN
  Administered 2021-11-16: 15 mL

## 2021-11-16 MED ORDER — MIDAZOLAM HCL 2 MG/2ML IJ SOLN
INTRAMUSCULAR | Status: DC | PRN
  Administered 2021-11-16: 2 mg via INTRAVENOUS

## 2021-11-16 MED ORDER — TETRACAINE HCL 0.5 % OP SOLN
1.0000 [drp] | OPHTHALMIC | Status: DC | PRN
  Administered 2021-11-16 (×3): 1 [drp] via OPHTHALMIC

## 2021-11-16 MED ORDER — SIGHTPATH DOSE#1 BSS IO SOLN: INTRAOCULAR | Status: DC | PRN

## 2021-11-16 MED ORDER — LIDOCAINE HCL (PF) 2 % IJ SOLN
INTRAOCULAR | Status: DC | PRN
  Administered 2021-11-16: 1 mL via INTRAOCULAR

## 2021-11-16 MED ORDER — ONDANSETRON HCL 4 MG/2ML IJ SOLN: 4.0000 mg | Freq: Once | INTRAMUSCULAR | Status: DC | PRN

## 2021-11-16 MED ORDER — PHENYLEPHRINE HCL 10 % OP SOLN
1.0000 [drp] | OPHTHALMIC | Status: DC | PRN
  Administered 2021-11-16 (×3): 1 [drp] via OPHTHALMIC

## 2021-11-16 MED ORDER — FENTANYL CITRATE (PF) 100 MCG/2ML IJ SOLN
INTRAMUSCULAR | Status: DC | PRN
  Administered 2021-11-16: 100 ug via INTRAVENOUS

## 2021-11-16 MED ORDER — SIGHTPATH DOSE#1 NA HYALUR & NA CHOND-NA HYALUR IO KIT
PACK | INTRAOCULAR | Status: DC | PRN
Start: 2021-11-16 — End: 2021-11-16
  Administered 2021-11-16: 1 via OPHTHALMIC

## 2021-11-16 MED ORDER — BRIMONIDINE TARTRATE-TIMOLOL 0.2-0.5 % OP SOLN
OPHTHALMIC | Status: DC | PRN
Start: 2021-11-16 — End: 2021-11-16
  Administered 2021-11-16: 1 [drp] via OPHTHALMIC

## 2021-11-16 MED ORDER — ACETAMINOPHEN 325 MG PO TABS: 325.0000 mg | ORAL_TABLET | ORAL | Status: DC | PRN

## 2021-11-16 MED ORDER — CYCLOPENTOLATE HCL 2 % OP SOLN
1.0000 [drp] | OPHTHALMIC | Status: DC | PRN
  Administered 2021-11-16 (×3): 1 [drp] via OPHTHALMIC

## 2021-11-16 MED ORDER — MOXIFLOXACIN HCL 0.5 % OP SOLN
OPHTHALMIC | Status: DC | PRN
Start: 2021-11-16 — End: 2021-11-16
  Administered 2021-11-16: 0.2 mL via OPHTHALMIC

## 2021-11-16 MED ORDER — ACETAMINOPHEN 160 MG/5ML PO SOLN: 325.0000 mg | ORAL | Status: DC | PRN

## 2021-11-16 SURGICAL SUPPLY — 22 items
CANNULA ANT/CHMB 27G (MISCELLANEOUS) IMPLANT
CANNULA ANT/CHMB 27GA (MISCELLANEOUS) IMPLANT
CATARACT SUITE SIGHTPATH (MISCELLANEOUS) ×2 IMPLANT
DISSECTOR HYDRO NUCLEUS 50X22 (MISCELLANEOUS) ×2 IMPLANT
DRSG TEGADERM 2-3/8X2-3/4 SM (GAUZE/BANDAGES/DRESSINGS) ×2 IMPLANT
FEE CATARACT SUITE SIGHTPATH (MISCELLANEOUS) ×1 IMPLANT
GLOVE SURG GAMMEX PI TX LF 7.5 (GLOVE) ×2 IMPLANT
GLOVE SURG SYN 8.5  E (GLOVE) ×1
GLOVE SURG SYN 8.5 E (GLOVE) ×1 IMPLANT
GLOVE SURG SYN 8.5 PF PI (GLOVE) ×1 IMPLANT
LENS IOL TECNIS EYHANCE 28.0 (Intraocular Lens) ×1 IMPLANT
NDL FILTER BLUNT 18X1 1/2 (NEEDLE) ×1 IMPLANT
NEEDLE FILTER BLUNT 18X 1/2SAF (NEEDLE) ×1
NEEDLE FILTER BLUNT 18X1 1/2 (NEEDLE) ×1 IMPLANT
PACK VIT ANT 23G (MISCELLANEOUS) IMPLANT
RING MALYGIN (MISCELLANEOUS) IMPLANT
SUT ETHILON 10-0 CS-B-6CS-B-6 (SUTURE)
SUTURE EHLN 10-0 CS-B-6CS-B-6 (SUTURE) IMPLANT
SYR 3ML LL SCALE MARK (SYRINGE) ×2 IMPLANT
SYR 5ML LL (SYRINGE) ×2 IMPLANT
TIP IRRIGATON/ASPIRATION (MISCELLANEOUS) ×1 IMPLANT
WATER STERILE IRR 250ML POUR (IV SOLUTION) ×2 IMPLANT

## 2021-11-16 NOTE — Anesthesia Procedure Notes (Signed)
Procedure Name: [facility] ?Date/Time: 11/16/2021 12:53 PM ?Performed by: Mayme Genta, CRNA ?Pre-anesthesia Checklist: Patient identified, Emergency Drugs available, Suction available, Timeout performed and Patient being monitored ?Patient Re-evaluated:Patient Re-evaluated prior to induction ?Oxygen Delivery Method: Nasal cannula ?Placement Confirmation: positive ETCO2 ? ? ? ? ?

## 2021-11-16 NOTE — Anesthesia Preprocedure Evaluation (Signed)
Anesthesia Evaluation  ?Patient identified by MRN, date of birth, ID band ?Patient awake ? ? ? ?Reviewed: ?Allergy & Precautions, NPO status  ? ?Airway ?Mallampati: II ? ?TM Distance: >3 FB ? ? ? ? Dental ?  ?Pulmonary ?COPD, Current SmokerPatient did not abstain from smoking.,  ?  ?Pulmonary exam normal ? ? ? ? ? ? ? Cardiovascular ?hypertension, + CAD, + Cardiac Stents (2018) and + Peripheral Vascular Disease  ? ?Rhythm:Regular Rate:Normal ? ?HLD ?  ?Neuro/Psych ?  ? GI/Hepatic ?  ?Endo/Other  ? ? Renal/GU ?  ? ?  ?Musculoskeletal ?Gout  ? Abdominal ?  ?Peds ? Hematology ?  ?Anesthesia Other Findings ? ? Reproductive/Obstetrics ? ?  ? ? ? ? ? ? ? ? ? ? ? ? ? ?  ?  ? ? ? ? ? ? ? ? ?Anesthesia Physical ?Anesthesia Plan ? ?ASA: 3 ? ?Anesthesia Plan: MAC  ? ?Post-op Pain Management: Minimal or no pain anticipated  ? ?Induction: Intravenous ? ?PONV Risk Score and Plan: TIVA, Midazolam and Treatment may vary due to age or medical condition ? ?Airway Management Planned: Natural Airway and Nasal Cannula ? ?Additional Equipment:  ? ?Intra-op Plan:  ? ?Post-operative Plan:  ? ?Informed Consent: I have reviewed the patients History and Physical, chart, labs and discussed the procedure including the risks, benefits and alternatives for the proposed anesthesia with the patient or authorized representative who has indicated his/her understanding and acceptance.  ? ? ? ? ? ?Plan Discussed with: CRNA ? ?Anesthesia Plan Comments:   ? ? ? ? ? ? ?Anesthesia Quick Evaluation ? ?

## 2021-11-16 NOTE — Transfer of Care (Signed)
Immediate Anesthesia Transfer of Care Note ? ?Patient: Aaron [name] ? ?Procedure(s) Performed: CATARACT EXTRACTION PHACO AND INTRAOCULAR LENS PLACEMENT (IOC) LEFT (Left: Eye) ? ?Patient Location: PACU ? ?Anesthesia Type: MAC ? ?Level of Consciousness: awake, alert  and patient cooperative ? ?Airway and Oxygen Therapy: Patient Spontanous Breathing and Patient connected to supplemental oxygen ? ?Post-op Assessment: Post-op Vital signs reviewed, Patient's Cardiovascular Status Stable, Respiratory Function Stable, Patent Airway and No signs of Nausea or vomiting ? ?Post-op Vital Signs: Reviewed and stable ? ?Complications: No notable events documented. ? ?

## 2021-11-16 NOTE — Anesthesia Postprocedure Evaluation (Signed)
Anesthesia Post Note ? ?Patient: Aaron [name] ? ?Procedure(s) Performed: CATARACT EXTRACTION PHACO AND INTRAOCULAR LENS PLACEMENT (IOC) LEFT (Left: Eye) ? ? ?  ?Patient location during evaluation: PACU ?Anesthesia Type: MAC ?Level of consciousness: awake and alert ?Pain management: pain level controlled ?Vital Signs Assessment: post-procedure vital signs reviewed and stable ?Respiratory status: spontaneous breathing, nonlabored ventilation, respiratory function stable and patient connected to nasal cannula oxygen ?Cardiovascular status: stable and blood pressure returned to baseline ?Postop Assessment: no apparent nausea or vomiting ?Anesthetic complications: no ? ? ?No notable events documented. ? ?Tyger Wichman ELAINE ? ? ? ? ? ?

## 2021-11-16 NOTE — Op Note (Signed)
OPERATIVE NOTE ? ?Aaron [name] ?[id number] ?11/16/2021 ? ? ?PREOPERATIVE DIAGNOSIS: Nuclear sclerotic cataract left eye. H25.12 ?  ?POSTOPERATIVE DIAGNOSIS: Nuclear sclerotic cataract left eye. H25.12 ?  ?PROCEDURE:  Phacoemusification with posterior chamber intraocular lens placement of the left eye  ?Ultrasound time: Procedure(s) with comments: ?CATARACT EXTRACTION PHACO AND INTRAOCULAR LENS PLACEMENT (IOC) LEFT (Left) - 6.91 ?0:46.0 ? ?LENS:   ?Implant Name Type Inv. Item Serial No. Manufacturer Lot No. LRB No. Used Action  ?LENS IOL TECNIS EYHANCE 28.0 - [id number] Intraocular Lens LENS IOL TECNIS EYHANCE 28.0 [id number] [employer]  Left 1 Implanted  ?   ? ?SURGEON:  Courtney Heys. Lazarus Salines, MD ?  ?ANESTHESIA:  Topical with tetracaine drops, augmented with 1% preservative-free intracameral lidocaine. ?  ?COMPLICATIONS:  None. ?  ?DESCRIPTION OF PROCEDURE:  The patient was identified in the holding room and transported to the operating room and placed in the supine position under the operating microscope.  The left eye was identified as the operative eye, which was prepped and draped in the usual sterile ophthalmic fashion. ?  ?A 1 millimeter clear-corneal paracentesis was made inferotemporally. Preservative-free 1% lidocaine mixed with 1:1,000 bisulfite-free aqueous solution of epinephrine was injected into the anterior chamber. The anterior chamber was then filled with Viscoat viscoelastic. A 2.4 millimeter keratome was used to make a clear-corneal incision superotemporally. A curvilinear capsulorrhexis was made with a cystotome and capsulorrhexis forceps. Balanced salt solution was used to hydrodissect and hydrodelineate the nucleus. Phacoemulsification was then used to remove the lens nucleus and epinucleus. The remaining cortex was then removed using the irrigation and aspiration handpiece. Provisc was then placed into the capsular bag to distend it for lens placement. A +28.00 D [id number] intraocular lens was then  injected into the capsular bag. The remaining viscoelastic was aspirated. ?  ?Wounds were hydrated with balanced salt solution.  The anterior chamber was inflated to a physiologic pressure with balanced salt solution.  No wound leaks were noted. Vigamox was injected intracamerally.  Timolol and Brimonidine drops were applied to the eye.  The patient was taken to the recovery room in stable condition without complications of anesthesia or surgery. ? ?[name] ?11/16/2021, 1:16 PM ? ?

## 2021-11-16 NOTE — H&P (Signed)
[place]  ? ?Primary Care Physician:  Glean Hess, MD ?Ophthalmologist: Dr. Merleen Nicely ? ?Pre-Procedure History & Physical: ?HPI:  Aaron [name] is a 66 y.o. male here for cataract surgery. ?  ?Past Medical History:  ?Diagnosis Date  ? COPD (chronic obstructive pulmonary disease) ([place])   ? Coronary artery disease   ? s/p PCI  ? Fracture of left ankle, closed, initial encounter 10/23/2017  ? GERD (gastroesophageal reflux disease)   ? Glaucoma   ? Gout   ? Hx of heart artery stent 01/12/2017  ? Hypercholesteremia   ? Hypertension   ? PAD (peripheral artery disease) ([place])   ? Pain in limb 07/13/2016  ? Syncope 09/16/2017  ? Unstable angina ([place]) 01/12/2017  ? Vertigo   ? ? ?Past Surgical History:  ?Procedure Laterality Date  ? APPENDECTOMY    ? cardiac stents   2012  ? CATARACT EXTRACTION W/PHACO Right 10/26/2021  ? Procedure: CATARACT EXTRACTION PHACO AND INTRAOCULAR LENS PLACEMENT ([place]) RIGHT OMIDRIA  10.98 01:18.2;  Surgeon: [name], MD;  Location: [facility];  Service: Ophthalmology;  Laterality: Right;  ? COLONOSCOPY  03/30/2013  ? [place]  ? COLONOSCOPY WITH PROPOFOL N/A 12/10/2019  ? Procedure: COLONOSCOPY WITH PROPOFOL;  Surgeon: [name], MD;  Location: [facility];  Service: Endoscopy;  Laterality: N/A;  ? COLONOSCOPY WITH PROPOFOL N/A 01/18/2020  ? Procedure: COLONOSCOPY WITH PROPOFOL;  Surgeon: [name], MD;  Location: [facility];  Service: Endoscopy;  Laterality: N/A;  4  ? LEFT HEART CATH AND CORONARY ANGIOGRAPHY N/A 12/29/2020  ? Procedure: LEFT HEART CATH AND CORONARY ANGIOGRAPHY;  Surgeon: Corey Skains, MD;  Location: [facility];  Service: Cardiovascular;  Laterality: N/A;  ? LOWER EXTREMITY ANGIOGRAPHY Right 09/25/2018  ? Procedure: LOWER EXTREMITY ANGIOGRAPHY;  Surgeon: Algernon Huxley, MD;  Location: [facility];  Service: Cardiovascular;  Laterality: Right;  ? LOWER EXTREMITY ANGIOGRAPHY Left 03/17/2020  ? Procedure:  LOWER EXTREMITY ANGIOGRAPHY;  Surgeon: Algernon Huxley, MD;  Location: [facility];  Service: Cardiovascular;  Laterality: Left;  ? POLYPECTOMY N/A 01/18/2020  ? Procedure: POLYPECTOMY;  Surgeon: [name], MD;  Location: [facility];  Service: Endoscopy;  Laterality: N/A;  ? ? ?Prior to Admission medications   ?Medication Sig Start Date End Date Taking? Authorizing Provider  ?albuterol (VENTOLIN HFA) 108 (90 Base) MCG/ACT inhaler Inhale 2 puffs into the lungs every 6 (six) hours as needed for wheezing or shortness of breath. 02/20/19   Glean Hess, MD  ?aspirin EC 81 MG tablet Take 1 tablet (81 mg total) by mouth daily. ?Patient taking differently: Take 325 mg by mouth daily. 03/17/20   Algernon Huxley, MD  ?cilostazol (PLETAL) 50 MG tablet Take by mouth 2 (two) times daily.  07/15/19   [provider]  ?clopidogrel (PLAVIX) 75 MG tablet Take 1 tablet by mouth daily.    [provider]  ?diclofenac (VOLTAREN) 75 MG EC tablet Take 1 tablet (75 mg total) by mouth 2 (two) times daily. 10/18/21   Glean Hess, MD  ?indomethacin (INDOCIN) 25 MG capsule Take 1 capsule (25 mg total) by mouth 2 (two) times daily as needed. 04/05/21   Glean Hess, MD  ?isosorbide mononitrate (IMDUR) 120 MG 24 hr tablet Take 120 mg by mouth daily. 11/17/19   [provider]  ?lisinopril (PRINIVIL,ZESTRIL) 10 MG tablet Take 1 tablet by mouth daily.    [provider]  ?  metoprolol tartrate (LOPRESSOR) 50 MG tablet Take 50-75 mg by mouth 2 (two) times a day. Take '50MG'$  by mouth every morning and '75MG'$  every evening    [provider]  ?nitroGLYCERIN (NITROSTAT) 0.4 MG SL tablet Place 1 tablet under the tongue as needed.    [provider]  ?simvastatin (ZOCOR) 40 MG tablet Take 1 tablet (40 mg total) by mouth at bedtime. 05/21/15   Glean Hess, MD  ? ? ?Allergies as of 09/26/2021 - Review Complete 08/17/2021  ?Allergen Reaction Noted  ? Contrast media [iodinated  contrast media] Hives 11/09/2019  ? Iodine Hives 11/09/2019  ? ? ?Family History  ?Problem Relation Age of Onset  ? Hypertension Mother   ? Diabetes Mother   ? Heart failure Father   ?     age 79  ? Cancer Father   ? Ovarian cancer Sister   ? Heart disease Brother   ? ? ?Social History  ? ?Socioeconomic History  ? Marital status: Single  ?  Spouse name: Not on file  ? Number of children: 0  ? Years of education: Not on file  ? Highest education level: 12th grade  ?Occupational History  ? Occupation: Retired/Disabled  ?Tobacco Use  ? Smoking status: Every Day  ?  Packs/day: 1.00  ?  Years: 46.00  ?  Pack years: 46.00  ?  Types: Cigarettes  ? Smokeless tobacco: Never  ? Tobacco comments:  ?  Pt down to 1/2 PPD 05/24/21  ?Vaping Use  ? Vaping Use: Never used  ?Substance and Sexual Activity  ? Alcohol use: Yes  ?  Comment: quit drinking around 04/2019(May have drink 1x/mo)  ? Drug use: No  ? Sexual activity: Not Currently  ?  Birth control/protection: None  ?Other Topics Concern  ? Not on file  ?Social History Narrative  ? Independent at baseline. Lives alone. Does not drive.   ? ?Social Determinants of Health  ? ?Financial Resource Strain: Low Risk   ? Difficulty of Paying Living Expenses: Not hard at all  ?Food Insecurity: No Food Insecurity  ? Worried About Charity fundraiser in the Last Year: Never true  ? Ran Out of Food in the Last Year: Never true  ?Transportation Needs: No Transportation Needs  ? Lack of Transportation (Medical): No  ? Lack of Transportation (Non-Medical): No  ?Physical Activity: Sufficiently Active  ? Days of Exercise per Week: 7 days  ? Minutes of Exercise per Session: 90 min  ?Stress: No Stress Concern Present  ? Feeling of Stress : Not at all  ?Social Connections: Socially Isolated  ? Frequency of Communication with Friends and Family: More than three times a week  ? Frequency of Social Gatherings with Friends and Family: More than three times a week  ? Attends Religious Services: Never  ?  Active Member of Clubs or Organizations: No  ? Attends Archivist Meetings: Never  ? Marital Status: Never married  ?Intimate Partner Violence: Not At Risk  ? Fear of Current or Ex-Partner: No  ? Emotionally Abused: No  ? Physically Abused: No  ? Sexually Abused: No  ? ? ?Review of Systems: ?See HPI, otherwise negative ROS ? ?Physical Exam: ?There were no vitals taken for this visit. ?General:   Alert, cooperative in NAD ?Head:  Normocephalic and atraumatic. ?Respiratory:  Normal work of breathing. ?Cardiovascular:  RRR ? ?Impression/Plan: ?RAWLIN REAUME is here for cataract surgery. ? ?Risks, benefits, limitations, and alternatives regarding cataract surgery  have been reviewed with the patient.  Questions have been answered.  All parties agreeable. ? ? ?[name], MD  11/16/2021, 11:35 AM  ? ?

## 2021-11-17 ENCOUNTER — Encounter: Payer: Self-pay | Admitting: Ophthalmology

## 2021-12-20 ENCOUNTER — Encounter: Payer: Self-pay | Admitting: Acute Care

## 2022-01-02 ENCOUNTER — Ambulatory Visit
Admission: RE | Admit: 2022-01-02 | Discharge: 2022-01-02 | Disposition: A | Discharge status: Home / Self Care | Payer: PPO | Source: Ambulatory Visit | Attending: Acute Care | Admitting: Acute Care

## 2022-01-02 DIAGNOSIS — F17210 Nicotine dependence, cigarettes, uncomplicated: Secondary | ICD-10-CM | POA: Insufficient documentation

## 2022-01-04 ENCOUNTER — Encounter: Payer: Self-pay | Admitting: Internal Medicine

## 2022-01-04 ENCOUNTER — Other Ambulatory Visit: Payer: Self-pay

## 2022-01-04 DIAGNOSIS — K760 Fatty (change of) liver, not elsewhere classified: Secondary | ICD-10-CM | POA: Insufficient documentation

## 2022-01-04 DIAGNOSIS — Z122 Encounter for screening for malignant neoplasm of respiratory organs: Secondary | ICD-10-CM

## 2022-01-04 DIAGNOSIS — Z87891 Personal history of nicotine dependence: Secondary | ICD-10-CM

## 2022-01-04 DIAGNOSIS — F17210 Nicotine dependence, cigarettes, uncomplicated: Secondary | ICD-10-CM

## 2022-01-11 ENCOUNTER — Encounter: Payer: Self-pay | Admitting: Internal Medicine

## 2022-01-11 ENCOUNTER — Ambulatory Visit (INDEPENDENT_AMBULATORY_CARE_PROVIDER_SITE_OTHER): Payer: PPO | Admitting: Internal Medicine

## 2022-01-11 VITALS — BP 130/62 | HR 70 | Ht 73.0 in | Wt 195.0 lb

## 2022-01-11 DIAGNOSIS — I25119 Atherosclerotic heart disease of native coronary artery with unspecified angina pectoris: Secondary | ICD-10-CM | POA: Diagnosis not present

## 2022-01-11 DIAGNOSIS — I10 Essential (primary) hypertension: Secondary | ICD-10-CM

## 2022-01-11 DIAGNOSIS — J449 Chronic obstructive pulmonary disease, unspecified: Secondary | ICD-10-CM

## 2022-01-11 DIAGNOSIS — K760 Fatty (change of) liver, not elsewhere classified: Secondary | ICD-10-CM

## 2022-01-11 NOTE — Progress Notes (Signed)
Date:  01/11/2022   Name:  Aaron [name]   DOB:  1955/08/18   MRN:  [id number]   Chief Complaint: Follow-up  Hyperlipidemia This is a chronic problem. The problem is uncontrolled. Recent lipid tests were reviewed and are high. Pertinent negatives include no chest pain, myalgias or shortness of breath. Current antihyperlipidemic treatment includes statins (zocor 80 mg - LDL 83).  Nicotine Dependence Presents for follow-up visit. Symptoms are negative for fatigue. His urge triggers include company of smokers. The symptoms have been improving. He smokes < 1/2 a pack of cigarettes per day. Compliance with prior treatments has been good (using hard candy to decrease cravings).  Recent CT screening - coronary and aortic atherosclerosis,  mild hepatic steatosis and small T12 compression deformity.  Lab Results  Component Value Date   NA 134 04/05/2021   K 4.5 04/05/2021   CO2 22 04/05/2021   GLUCOSE 102 (H) 04/05/2021   BUN 15 04/05/2021   CREATININE 0.86 04/05/2021   CALCIUM 9.8 04/05/2021   EGFR 96 04/05/2021   GFRNONAA >60 12/28/2020   Lab Results  Component Value Date   CHOL 140 04/05/2021   HDL 36 (L) 04/05/2021   LDLCALC 83 04/05/2021   TRIG 114 04/05/2021   CHOLHDL 3.9 04/05/2021   Lab Results  Component Value Date   TSH 3.214 02/06/2019   Lab Results  Component Value Date   HGBA1C 5.5 08/17/2021   Lab Results  Component Value Date   WBC 6.6 12/29/2020   HGB 16.6 12/29/2020   HCT 47.9 12/29/2020   MCV 96.8 12/29/2020   PLT 181 12/29/2020   Lab Results  Component Value Date   ALT 44 04/05/2021   AST 31 04/05/2021   ALKPHOS 77 04/05/2021   BILITOT 0.6 04/05/2021   No results found for: "25OHVITD2", "25OHVITD3", "VD25OH"   Review of Systems  Constitutional:  Negative for fatigue and unexpected weight change.  HENT:  Negative for nosebleeds.   Eyes:  Negative for visual disturbance.  Respiratory:  Negative for cough, chest tightness, shortness of breath  and wheezing.   Cardiovascular:  Negative for chest pain, palpitations and leg swelling.  Gastrointestinal:  Negative for abdominal pain, constipation and diarrhea.  Musculoskeletal:  Negative for back pain and myalgias.  Neurological:  Negative for dizziness, weakness, light-headedness and headaches.  Psychiatric/Behavioral:  Negative for dysphoric mood. The patient is not nervous/anxious.     Patient Active Problem List   Diagnosis Date Noted   Hepatic steatosis 01/04/2022   Unstable angina (HCC) 12/27/2020   Aortic atherosclerosis (HCC) 07/10/2020   Stage 2 moderate COPD by GOLD classification (HCC) 05/16/2020   Dyspepsia and disorder of function of stomach 05/16/2020   Cervical disc disorder of mid-cervical region 05/10/2020   Polyp of descending colon    Mixed hyperlipidemia 10/28/2018   Atherosclerosis of native arteries of extremity with intermittent claudication (HCC) 07/13/2016   Tobacco use disorder 07/13/2016   Prediabetes 07/27/2015   Controlled gout 12/22/2014   Coronary artery disease involving native coronary artery of native heart with angina pectoris (HCC) 10/09/2014   Essential (primary) hypertension 10/09/2014   Dupuytren's disease of palm 10/09/2014   Psoriasis 10/09/2014   H/O cardiac catheterization 01/27/2014   Peripheral vascular disease (HCC) 01/27/2014    Allergies  Allergen Reactions   Contrast Media [Iodinated Contrast Media] Hives   Flexeril [Cyclobenzaprine] Other (See Comments)    Urinary retention, constipation   Iodine Hives    Past Surgical History:  Procedure Laterality  Date   APPENDECTOMY     cardiac stents   2012   CATARACT EXTRACTION W/PHACO Right 10/26/2021   Procedure: CATARACT EXTRACTION PHACO AND INTRAOCULAR LENS PLACEMENT ([place]) RIGHT OMIDRIA  10.98 01:18.2;  Surgeon: [name], MD;  Location: [place];  Service: Ophthalmology;  Laterality: Right;   CATARACT EXTRACTION W/PHACO Left 11/16/2021   Procedure:  CATARACT EXTRACTION PHACO AND INTRAOCULAR LENS PLACEMENT ([place]) LEFT;  Surgeon: [name], MD;  Location: [place];  Service: Ophthalmology;  Laterality: Left;  6.91 0:46.0   COLONOSCOPY  03/30/2013   [place]   COLONOSCOPY WITH PROPOFOL N/A 12/10/2019   Procedure: COLONOSCOPY WITH PROPOFOL;  Surgeon: [name], MD;  Location: [place];  Service: Endoscopy;  Laterality: N/A;   COLONOSCOPY WITH PROPOFOL N/A 01/18/2020   Procedure: COLONOSCOPY WITH PROPOFOL;  Surgeon: [name], MD;  Location: [place];  Service: Endoscopy;  Laterality: N/A;  4   LEFT HEART CATH AND CORONARY ANGIOGRAPHY N/A 12/29/2020   Procedure: LEFT HEART CATH AND CORONARY ANGIOGRAPHY;  Surgeon: Corey Skains, MD;  Location: [place] [facility];  Service: Cardiovascular;  Laterality: N/A;   LOWER EXTREMITY ANGIOGRAPHY Right 09/25/2018   Procedure: LOWER EXTREMITY ANGIOGRAPHY;  Surgeon: Algernon Huxley, MD;  Location: [place] [facility];  Service: Cardiovascular;  Laterality: Right;   LOWER EXTREMITY ANGIOGRAPHY Left 03/17/2020   Procedure: LOWER EXTREMITY ANGIOGRAPHY;  Surgeon: Algernon Huxley, MD;  Location: [place] [facility];  Service: Cardiovascular;  Laterality: Left;   POLYPECTOMY N/A 01/18/2020   Procedure: POLYPECTOMY;  Surgeon: [name], MD;  Location: [place];  Service: Endoscopy;  Laterality: N/A;    Social History   Tobacco Use   Smoking status: Every Day    Packs/day: 1.00    Years: 46.00    Total pack years: 46.00    Types: Cigarettes   Smokeless tobacco: Never   Tobacco comments:    Pt down to 1/2 PPD 05/24/21  Vaping Use   Vaping Use: Never used  Substance Use Topics   Alcohol use: Yes    Comment: quit drinking around 04/2019(May have drink 1x/mo)   Drug use: No     Medication list has been reviewed and updated.  Current Meds  Medication Sig   albuterol (VENTOLIN HFA) 108 (90 Base) MCG/ACT inhaler Inhale 2 puffs into the lungs  every 6 (six) hours as needed for wheezing or shortness of breath.   aspirin EC 81 MG tablet Take 1 tablet (81 mg total) by mouth daily. (Patient taking differently: Take 325 mg by mouth daily.)   cilostazol (PLETAL) 50 MG tablet Take by mouth 2 (two) times daily.    clopidogrel (PLAVIX) 75 MG tablet Take 1 tablet by mouth daily.   diclofenac (VOLTAREN) 75 MG EC tablet Take 1 tablet (75 mg total) by mouth 2 (two) times daily.   indomethacin (INDOCIN) 25 MG capsule Take 1 capsule (25 mg total) by mouth 2 (two) times daily as needed.   isosorbide mononitrate (IMDUR) 120 MG 24 hr tablet Take 120 mg by mouth daily.   lisinopril (PRINIVIL,ZESTRIL) 10 MG tablet Take 1 tablet by mouth daily.   metoprolol tartrate (LOPRESSOR) 50 MG tablet Take 50-75 mg by mouth 2 (two) times a day. Take $RemoveB'50MG'PwGdftYU$  by mouth every morning and $RemoveBef'75MG'xuNdLUOTeQ$  every evening   nitroGLYCERIN (NITROSTAT) 0.4 MG SL tablet Place 1 tablet under the tongue as needed.   simvastatin (ZOCOR) 40 MG tablet Take 1 tablet (40 mg total)  by mouth at bedtime.       01/11/2022   10:19 AM 10/18/2021   11:18 AM 10/06/2021    1:55 PM 09/28/2021    9:44 AM  GAD 7 : Generalized Anxiety Score  Nervous, Anxious, on Edge 0 0 0 0  Control/stop worrying 0 0 0 0  Worry too much - different things 0 0 0 0  Trouble relaxing 0 0 0 0  Restless 0 0 0 0  Easily annoyed or irritable 0 0 0 0  Afraid - awful might happen 0 0 0 0  Total GAD 7 Score 0 0 0 0  Anxiety Difficulty Not difficult at all Not difficult at all Not difficult at all Not difficult at all       01/11/2022   10:19 AM 10/18/2021   11:18 AM 10/06/2021    1:55 PM  Depression screen PHQ 2/9  Decreased Interest 0 0 0  Down, Depressed, Hopeless 0 0 0  PHQ - 2 Score 0 0 0  Altered sleeping 0 0 0  Tired, decreased energy 0 0 0  Change in appetite 0 0 0  Feeling bad or failure about yourself  0 0 0  Trouble concentrating 0 0 0  Moving slowly or fidgety/restless 0 0 0  Suicidal thoughts 0 0 0  PHQ-9  Score 0 0 0  Difficult doing work/chores Not difficult at all Not difficult at all Not difficult at all    BP Readings from Last 3 Encounters:  01/11/22 130/62  11/16/21 108/74  10/26/21 125/76    Physical Exam Vitals and nursing note reviewed.  Constitutional:      General: He is not in acute distress.    Appearance: Normal appearance. He is well-developed.  HENT:     Head: Normocephalic and atraumatic.  Neck:     Vascular: No carotid bruit.  Cardiovascular:     Rate and Rhythm: Normal rate and regular rhythm.     Pulses: Normal pulses.  Pulmonary:     Effort: Pulmonary effort is normal. No respiratory distress.     Breath sounds: Decreased breath sounds present. No wheezing.  Musculoskeletal:     Cervical back: Normal range of motion.  Lymphadenopathy:     Cervical: No cervical adenopathy.  Skin:    General: Skin is warm and dry.     Findings: No rash.  Neurological:     Mental Status: He is alert and oriented to person, place, and time.  Psychiatric:        Mood and Affect: Mood normal.        Behavior: Behavior normal.     Wt Readings from Last 3 Encounters:  01/11/22 195 lb (88.5 kg)  11/16/21 194 lb (88 kg)  10/26/21 193 lb (87.5 kg)    BP 130/62   Pulse 70   Ht $R'6\' 1"'td$  (1.854 m)   Wt 195 lb (88.5 kg)   SpO2 94%   BMI 25.73 kg/m   Assessment and Plan: 1. Essential (primary) hypertension Clinically stable exam with well controlled BP. Tolerating medications without side effects at this time. Pt to continue current regimen and low sodium diet; benefits of regular exercise as able discussed.  2. Chronic obstructive pulmonary disease, unspecified COPD type ([place]) Recent CT with no evidence of malignancy Continue efforts to cut back and then quit completely  3. Coronary artery disease involving native coronary artery of native heart with angina pectoris ([place]) Followed by Cardiology.  Stable angina treated with ISDN, metoprolol,  Plavix and aspirin.  4.  Hepatic steatosis Recommend low fat diet and weight loss Avoid high routine doses of Tylenol and alcohol.   Partially dictated using Editor, commissioning. Any errors are unintentional.  Halina Maidens, MD [facility]  01/11/2022

## 2022-02-09 ENCOUNTER — Other Ambulatory Visit (INDEPENDENT_AMBULATORY_CARE_PROVIDER_SITE_OTHER): Payer: Self-pay | Admitting: Vascular Surgery

## 2022-02-09 ENCOUNTER — Ambulatory Visit (INDEPENDENT_AMBULATORY_CARE_PROVIDER_SITE_OTHER): Payer: PPO

## 2022-02-09 ENCOUNTER — Encounter (INDEPENDENT_AMBULATORY_CARE_PROVIDER_SITE_OTHER): Payer: Self-pay | Admitting: Vascular Surgery

## 2022-02-09 ENCOUNTER — Ambulatory Visit (INDEPENDENT_AMBULATORY_CARE_PROVIDER_SITE_OTHER): Payer: PPO | Admitting: Vascular Surgery

## 2022-02-09 VITALS — BP 136/73 | HR 63 | Resp 17 | Ht 73.0 in | Wt 194.2 lb

## 2022-02-09 DIAGNOSIS — I70211 Atherosclerosis of native arteries of extremities with intermittent claudication, right leg: Secondary | ICD-10-CM | POA: Diagnosis not present

## 2022-02-09 DIAGNOSIS — Z9889 Other specified postprocedural states: Secondary | ICD-10-CM

## 2022-02-09 DIAGNOSIS — I10 Essential (primary) hypertension: Secondary | ICD-10-CM | POA: Diagnosis not present

## 2022-02-09 DIAGNOSIS — E782 Mixed hyperlipidemia: Secondary | ICD-10-CM | POA: Diagnosis not present

## 2022-02-09 DIAGNOSIS — I739 Peripheral vascular disease, unspecified: Secondary | ICD-10-CM

## 2022-02-09 NOTE — Progress Notes (Signed)
MRN : [id number]  Aaron [name] is a 66 y.o. (1956-06-05) male who presents with chief complaint of No chief complaint on file. [name]  History of Present Illness: Patient returns today in follow up of his PAD.  The patient reports over the past month or 2, his walking distances have worsened and he is getting more cramping and pain in his calves.  He is also having some of a defect up in his thigh and hip area.  Both legs are affected.  They are roughly equal in their symptomatology.  He denies any open wounds or infection.  No fevers or chills.  He remains on appropriate medical therapy with aspirin, Plavix, and a statin agent as well as Pletal. ABIs today have dropped bilaterally down to 0.76 on the right and 0.77 on the left.  Duplex today shows the right side to have stenosis in the popliteal artery and the left side to have stenosis in the common femoral artery by duplex criteria.   Current Outpatient Medications  Medication Sig Dispense Refill   albuterol (VENTOLIN HFA) 108 (90 Base) MCG/ACT inhaler Inhale 2 puffs into the lungs every 6 (six) hours as needed for wheezing or shortness of breath. 6.7 g 5   aspirin EC 81 MG tablet Take 1 tablet (81 mg total) by mouth daily. (Patient taking differently: Take 325 mg by mouth daily.) 150 tablet 2   cilostazol (PLETAL) 50 MG tablet Take by mouth 2 (two) times daily.      clopidogrel (PLAVIX) 75 MG tablet Take 1 tablet by mouth daily.     indomethacin (INDOCIN) 25 MG capsule Take 1 capsule (25 mg total) by mouth 2 (two) times daily as needed. 30 capsule 1   isosorbide mononitrate (IMDUR) 120 MG 24 hr tablet Take 120 mg by mouth daily.     lisinopril (PRINIVIL,ZESTRIL) 10 MG tablet Take 1 tablet by mouth daily.     metoprolol tartrate (LOPRESSOR) 50 MG tablet Take 50-75 mg by mouth 2 (two) times a day. Take '50MG'$  by mouth every morning and '75MG'$  every evening     nitroGLYCERIN (NITROSTAT) 0.4 MG SL tablet Place 1 tablet under the tongue as needed.      simvastatin (ZOCOR) 40 MG tablet Take 1 tablet (40 mg total) by mouth at bedtime. 30 tablet 5   No current facility-administered medications for this visit.    Past Medical History:  Diagnosis Date   COPD (chronic obstructive pulmonary disease) ([place])    Coronary artery disease    s/p PCI   Fracture of left ankle, closed, initial encounter 10/23/2017   GERD (gastroesophageal reflux disease)    Glaucoma    Gout    Hx of heart artery stent 01/12/2017   Hypercholesteremia    Hypertension    PAD (peripheral artery disease) ([place])    Pain in limb 07/13/2016   Syncope 09/16/2017   Unstable angina ([place]) 01/12/2017   Vertigo     Past Surgical History:  Procedure Laterality Date   APPENDECTOMY     cardiac stents   2012   CATARACT EXTRACTION W/PHACO Right 10/26/2021   Procedure: CATARACT EXTRACTION PHACO AND INTRAOCULAR LENS PLACEMENT ([place]) RIGHT OMIDRIA  10.98 01:18.2;  Surgeon: [name], MD;  Location: [place];  Service: Ophthalmology;  Laterality: Right;   CATARACT EXTRACTION W/PHACO Left 11/16/2021   Procedure: CATARACT EXTRACTION PHACO AND INTRAOCULAR LENS PLACEMENT ([place]) LEFT;  Surgeon: [name], MD;  Location: [place];  Service: Ophthalmology;  Laterality: Left;  6.91 0:46.0   COLONOSCOPY  03/30/2013   [place]   COLONOSCOPY WITH PROPOFOL N/A 12/10/2019   Procedure: COLONOSCOPY WITH PROPOFOL;  Surgeon: [name], MD;  Location: [place];  Service: Endoscopy;  Laterality: N/A;   COLONOSCOPY WITH PROPOFOL N/A 01/18/2020   Procedure: COLONOSCOPY WITH PROPOFOL;  Surgeon: [name], MD;  Location: [place];  Service: Endoscopy;  Laterality: N/A;  4   LEFT HEART CATH AND CORONARY ANGIOGRAPHY N/A 12/29/2020   Procedure: LEFT HEART CATH AND CORONARY ANGIOGRAPHY;  Surgeon: Corey Skains, MD;  Location: [place] [facility];  Service: Cardiovascular;  Laterality: N/A;   LOWER EXTREMITY ANGIOGRAPHY Right 09/25/2018    Procedure: LOWER EXTREMITY ANGIOGRAPHY;  Surgeon: Algernon Huxley, MD;  Location: [place] [facility];  Service: Cardiovascular;  Laterality: Right;   LOWER EXTREMITY ANGIOGRAPHY Left 03/17/2020   Procedure: LOWER EXTREMITY ANGIOGRAPHY;  Surgeon: Algernon Huxley, MD;  Location: [place] [facility];  Service: Cardiovascular;  Laterality: Left;   POLYPECTOMY N/A 01/18/2020   Procedure: POLYPECTOMY;  Surgeon: [name], MD;  Location: [place];  Service: Endoscopy;  Laterality: N/A;     Social History   Tobacco Use   Smoking status: Every Day    Packs/day: 1.00    Years: 46.00    Total pack years: 46.00    Types: Cigarettes   Smokeless tobacco: Never   Tobacco comments:    Pt down to 1/2 PPD 05/24/21  Vaping Use   Vaping Use: Never used  Substance Use Topics   Alcohol use: Yes    Comment: quit drinking around 04/2019(May have drink 1x/mo)   Drug use: No      Family History  Problem Relation Age of Onset   Hypertension Mother    Diabetes Mother    Heart failure Father        age 79   Cancer Father    Ovarian cancer Sister    Heart disease Brother      Allergies  Allergen Reactions   Contrast Media [Iodinated Contrast Media] Hives   Flexeril [Cyclobenzaprine] Other (See Comments)    Urinary retention, constipation   Iodine Hives    REVIEW OF SYSTEMS (Negative unless checked)   Constitutional: '[]'$ Weight loss  '[]'$ Fever  '[]'$ Chills Cardiac: '[]'$ Chest pain   '[]'$ Chest pressure   '[]'$ Palpitations   '[]'$ Shortness of breath when laying flat   '[]'$ Shortness of breath at rest   '[]'$ Shortness of breath with exertion. Vascular:  '[]'$ Pain in legs with walking   '[]'$ Pain in legs at rest   '[]'$ Pain in legs when laying flat   '[x]'$ Claudication   '[]'$ Pain in feet when walking  '[]'$ Pain in feet at rest  '[]'$ Pain in feet when laying flat   '[]'$ History of DVT   '[]'$ Phlebitis   '[]'$ Swelling in legs   '[]'$ Varicose veins   '[]'$ Non-healing ulcers Pulmonary:   '[]'$ Uses home oxygen   '[]'$ Productive cough   '[]'$ Hemoptysis    '[]'$ Wheeze  '[]'$ COPD   '[]'$ Asthma Neurologic:  '[]'$ Dizziness  '[]'$ Blackouts   '[]'$ Seizures   '[]'$ History of stroke   '[]'$ History of TIA  '[]'$ Aphasia   '[]'$ Temporary blindness   '[]'$ Dysphagia   '[]'$ Weakness or numbness in arms   '[]'$ Weakness or numbness in legs Musculoskeletal:  '[x]'$ Arthritis   '[]'$ Joint swelling   '[x]'$ Joint pain   '[]'$ Low back pain Hematologic:  '[]'$ Easy bruising  '[]'$ Easy bleeding   '[]'$ Hypercoagulable state   '[]'$ Anemic   Gastrointestinal:  '[]'$ Blood in stool   '[]'$ Vomiting blood  '[]'$ Gastroesophageal reflux/heartburn   '[]'$   Abdominal pain Genitourinary:  '[]'$ Chronic kidney disease   '[]'$ Difficult urination  '[]'$ Frequent urination  '[]'$ Burning with urination   '[]'$ Hematuria Skin:  '[]'$ Rashes   '[]'$ Ulcers   '[]'$ Wounds Psychological:  '[]'$ History of anxiety   '[]'$  History of major depression.  Physical Examination  BP 136/73 (BP Location: Right Arm)   Pulse 63   Resp 17   Ht '6\' 1"'$  (1.854 m)   Wt 194 lb 3.2 oz (88.1 kg)   BMI 25.62 kg/m  Gen:  WD/WN, NAD Head: [place]/AT, No temporalis wasting. Ear/Nose/Throat: Hearing grossly intact, nares w/o erythema or drainage Eyes: Conjunctiva clear. Sclera non-icteric Neck: Supple.  Trachea midline Pulmonary:  Good air movement, no use of accessory muscles.  Cardiac: RRR, no JVD Vascular:  Vessel Right Left  Radial Palpable Palpable                          PT 1+ palpable 1+ palpable  DP 1+ palpable 1+ palpable   Gastrointestinal: soft, non-tender/non-distended. No guarding/reflex.  Musculoskeletal: M/S 5/5 throughout.  No deformity or atrophy.  No edema. Neurologic: Sensation grossly intact in extremities.  Symmetrical.  Speech is fluent.  Psychiatric: Judgment intact, Mood & affect appropriate for pt's clinical situation. Dermatologic: No rashes or ulcers noted.  No cellulitis or open wounds.      Labs No results found for this or any previous visit (from the past 2160 hour(s)).  Radiology No results found.  Assessment/Plan Essential (primary) hypertension blood pressure  control important in reducing the progression of atherosclerotic disease. On appropriate oral medications.     Mixed hyperlipidemia lipid control important in reducing the progression of atherosclerotic disease. Continue statin therapy  Atherosclerosis of native arteries of extremity with intermittent claudication (HCC) ABIs today have dropped bilaterally down to 0.76 on the right and 0.77 on the left.  Duplex today shows the right side to have stenosis in the popliteal artery and the left side to have stenosis in the common femoral artery by duplex criteria.  This is a change from his previous visits, and his symptoms are also worse.  At this point, I would recommend angiogram of the right lower extremity to try to improve his symptoms.  We should also get a good look at his left common femoral artery on this side as well and see if this will be something that will be amenable to endovascular therapy.  I have discussed the risks and benefits of the procedure.  Patient voices his understanding and desires to proceed.    [name], MD  02/09/2022 12:05 PM    This note was created with Dragon medical transcription system.  Any errors from dictation are purely unintentional

## 2022-02-09 NOTE — Assessment & Plan Note (Signed)
ABIs today have dropped bilaterally down to 0.76 on the right and 0.77 on the left.  Duplex today shows the right side to have stenosis in the popliteal artery and the left side to have stenosis in the common femoral artery by duplex criteria.  This is a change from his previous visits, and his symptoms are also worse.  At this point, I would recommend angiogram of the right lower extremity to try to improve his symptoms.  We should also get a good look at his left common femoral artery on this side as well and see if this will be something that will be amenable to endovascular therapy.  I have discussed the risks and benefits of the procedure.  Patient voices his understanding and desires to proceed.

## 2022-02-09 NOTE — H&P (View-Only) (Signed)
MRN : [id number]  Aaron [name] is a 66 y.o. (1955/11/30) male who presents with chief complaint of No chief complaint on file. [name]  History of Present Illness: Patient returns today in follow up of his PAD.  The patient reports over the past month or 2, his walking distances have worsened and he is getting more cramping and pain in his calves.  He is also having some of a defect up in his thigh and hip area.  Both legs are affected.  They are roughly equal in their symptomatology.  He denies any open wounds or infection.  No fevers or chills.  He remains on appropriate medical therapy with aspirin, Plavix, and a statin agent as well as Pletal. ABIs today have dropped bilaterally down to 0.76 on the right and 0.77 on the left.  Duplex today shows the right side to have stenosis in the popliteal artery and the left side to have stenosis in the common femoral artery by duplex criteria.   Current Outpatient Medications  Medication Sig Dispense Refill   albuterol (VENTOLIN HFA) 108 (90 Base) MCG/ACT inhaler Inhale 2 puffs into the lungs every 6 (six) hours as needed for wheezing or shortness of breath. 6.7 g 5   aspirin EC 81 MG tablet Take 1 tablet (81 mg total) by mouth daily. (Patient taking differently: Take 325 mg by mouth daily.) 150 tablet 2   cilostazol (PLETAL) 50 MG tablet Take by mouth 2 (two) times daily.      clopidogrel (PLAVIX) 75 MG tablet Take 1 tablet by mouth daily.     indomethacin (INDOCIN) 25 MG capsule Take 1 capsule (25 mg total) by mouth 2 (two) times daily as needed. 30 capsule 1   isosorbide mononitrate (IMDUR) 120 MG 24 hr tablet Take 120 mg by mouth daily.     lisinopril (PRINIVIL,ZESTRIL) 10 MG tablet Take 1 tablet by mouth daily.     metoprolol tartrate (LOPRESSOR) 50 MG tablet Take 50-75 mg by mouth 2 (two) times a day. Take '50MG'$  by mouth every morning and '75MG'$  every evening     nitroGLYCERIN (NITROSTAT) 0.4 MG SL tablet Place 1 tablet under the tongue as needed.      simvastatin (ZOCOR) 40 MG tablet Take 1 tablet (40 mg total) by mouth at bedtime. 30 tablet 5   No current facility-administered medications for this visit.    Past Medical History:  Diagnosis Date   COPD (chronic obstructive pulmonary disease) ([place])    Coronary artery disease    s/p PCI   Fracture of left ankle, closed, initial encounter 10/23/2017   GERD (gastroesophageal reflux disease)    Glaucoma    Gout    Hx of heart artery stent 01/12/2017   Hypercholesteremia    Hypertension    PAD (peripheral artery disease) ([place])    Pain in limb 07/13/2016   Syncope 09/16/2017   Unstable angina ([place]) 01/12/2017   Vertigo     Past Surgical History:  Procedure Laterality Date   APPENDECTOMY     cardiac stents   2012   CATARACT EXTRACTION W/PHACO Right 10/26/2021   Procedure: CATARACT EXTRACTION PHACO AND INTRAOCULAR LENS PLACEMENT ([place]) RIGHT OMIDRIA  10.98 01:18.2;  Surgeon: [name], MD;  Location: [place];  Service: Ophthalmology;  Laterality: Right;   CATARACT EXTRACTION W/PHACO Left 11/16/2021   Procedure: CATARACT EXTRACTION PHACO AND INTRAOCULAR LENS PLACEMENT ([place]) LEFT;  Surgeon: [name], MD;  Location: [place];  Service: Ophthalmology;  Laterality: Left;  6.91 0:46.0   COLONOSCOPY  03/30/2013   [place]   COLONOSCOPY WITH PROPOFOL N/A 12/10/2019   Procedure: COLONOSCOPY WITH PROPOFOL;  Surgeon: [name], MD;  Location: [place];  Service: Endoscopy;  Laterality: N/A;   COLONOSCOPY WITH PROPOFOL N/A 01/18/2020   Procedure: COLONOSCOPY WITH PROPOFOL;  Surgeon: [name], MD;  Location: [place];  Service: Endoscopy;  Laterality: N/A;  4   LEFT HEART CATH AND CORONARY ANGIOGRAPHY N/A 12/29/2020   Procedure: LEFT HEART CATH AND CORONARY ANGIOGRAPHY;  Surgeon: Corey Skains, MD;  Location: [place] [facility];  Service: Cardiovascular;  Laterality: N/A;   LOWER EXTREMITY ANGIOGRAPHY Right 09/25/2018    Procedure: LOWER EXTREMITY ANGIOGRAPHY;  Surgeon: Algernon Huxley, MD;  Location: [place] [facility];  Service: Cardiovascular;  Laterality: Right;   LOWER EXTREMITY ANGIOGRAPHY Left 03/17/2020   Procedure: LOWER EXTREMITY ANGIOGRAPHY;  Surgeon: Algernon Huxley, MD;  Location: [place] [facility];  Service: Cardiovascular;  Laterality: Left;   POLYPECTOMY N/A 01/18/2020   Procedure: POLYPECTOMY;  Surgeon: [name], MD;  Location: [place];  Service: Endoscopy;  Laterality: N/A;     Social History   Tobacco Use   Smoking status: Every Day    Packs/day: 1.00    Years: 46.00    Total pack years: 46.00    Types: Cigarettes   Smokeless tobacco: Never   Tobacco comments:    Pt down to 1/2 PPD 05/24/21  Vaping Use   Vaping Use: Never used  Substance Use Topics   Alcohol use: Yes    Comment: quit drinking around 04/2019(May have drink 1x/mo)   Drug use: No      Family History  Problem Relation Age of Onset   Hypertension Mother    Diabetes Mother    Heart failure Father        age 41   Cancer Father    Ovarian cancer Sister    Heart disease Brother      Allergies  Allergen Reactions   Contrast Media [Iodinated Contrast Media] Hives   Flexeril [Cyclobenzaprine] Other (See Comments)    Urinary retention, constipation   Iodine Hives    REVIEW OF SYSTEMS (Negative unless checked)   Constitutional: '[]'$ Weight loss  '[]'$ Fever  '[]'$ Chills Cardiac: '[]'$ Chest pain   '[]'$ Chest pressure   '[]'$ Palpitations   '[]'$ Shortness of breath when laying flat   '[]'$ Shortness of breath at rest   '[]'$ Shortness of breath with exertion. Vascular:  '[]'$ Pain in legs with walking   '[]'$ Pain in legs at rest   '[]'$ Pain in legs when laying flat   '[x]'$ Claudication   '[]'$ Pain in feet when walking  '[]'$ Pain in feet at rest  '[]'$ Pain in feet when laying flat   '[]'$ History of DVT   '[]'$ Phlebitis   '[]'$ Swelling in legs   '[]'$ Varicose veins   '[]'$ Non-healing ulcers Pulmonary:   '[]'$ Uses home oxygen   '[]'$ Productive cough   '[]'$ Hemoptysis    '[]'$ Wheeze  '[]'$ COPD   '[]'$ Asthma Neurologic:  '[]'$ Dizziness  '[]'$ Blackouts   '[]'$ Seizures   '[]'$ History of stroke   '[]'$ History of TIA  '[]'$ Aphasia   '[]'$ Temporary blindness   '[]'$ Dysphagia   '[]'$ Weakness or numbness in arms   '[]'$ Weakness or numbness in legs Musculoskeletal:  '[x]'$ Arthritis   '[]'$ Joint swelling   '[x]'$ Joint pain   '[]'$ Low back pain Hematologic:  '[]'$ Easy bruising  '[]'$ Easy bleeding   '[]'$ Hypercoagulable state   '[]'$ Anemic   Gastrointestinal:  '[]'$ Blood in stool   '[]'$ Vomiting blood  '[]'$ Gastroesophageal reflux/heartburn   '[]'$   Abdominal pain Genitourinary:  '[]'$ Chronic kidney disease   '[]'$ Difficult urination  '[]'$ Frequent urination  '[]'$ Burning with urination   '[]'$ Hematuria Skin:  '[]'$ Rashes   '[]'$ Ulcers   '[]'$ Wounds Psychological:  '[]'$ History of anxiety   '[]'$  History of major depression.  Physical Examination  BP 136/73 (BP Location: Right Arm)   Pulse 63   Resp 17   Ht '6\' 1"'$  (1.854 m)   Wt 194 lb 3.2 oz (88.1 kg)   BMI 25.62 kg/m  Gen:  WD/WN, NAD Head: [place]/AT, No temporalis wasting. Ear/Nose/Throat: Hearing grossly intact, nares w/o erythema or drainage Eyes: Conjunctiva clear. Sclera non-icteric Neck: Supple.  Trachea midline Pulmonary:  Good air movement, no use of accessory muscles.  Cardiac: RRR, no JVD Vascular:  Vessel Right Left  Radial Palpable Palpable                          PT 1+ palpable 1+ palpable  DP 1+ palpable 1+ palpable   Gastrointestinal: soft, non-tender/non-distended. No guarding/reflex.  Musculoskeletal: M/S 5/5 throughout.  No deformity or atrophy.  No edema. Neurologic: Sensation grossly intact in extremities.  Symmetrical.  Speech is fluent.  Psychiatric: Judgment intact, Mood & affect appropriate for pt's clinical situation. Dermatologic: No rashes or ulcers noted.  No cellulitis or open wounds.      Labs No results found for this or any previous visit (from the past 2160 hour(s)).  Radiology No results found.  Assessment/Plan Essential (primary) hypertension blood pressure  control important in reducing the progression of atherosclerotic disease. On appropriate oral medications.     Mixed hyperlipidemia lipid control important in reducing the progression of atherosclerotic disease. Continue statin therapy  Atherosclerosis of native arteries of extremity with intermittent claudication (HCC) ABIs today have dropped bilaterally down to 0.76 on the right and 0.77 on the left.  Duplex today shows the right side to have stenosis in the popliteal artery and the left side to have stenosis in the common femoral artery by duplex criteria.  This is a change from his previous visits, and his symptoms are also worse.  At this point, I would recommend angiogram of the right lower extremity to try to improve his symptoms.  We should also get a good look at his left common femoral artery on this side as well and see if this will be something that will be amenable to endovascular therapy.  I have discussed the risks and benefits of the procedure.  Patient voices his understanding and desires to proceed.    [name], MD  02/09/2022 12:05 PM    This note was created with Dragon medical transcription system.  Any errors from dictation are purely unintentional

## 2022-02-12 ENCOUNTER — Telehealth (INDEPENDENT_AMBULATORY_CARE_PROVIDER_SITE_OTHER): Payer: Self-pay

## 2022-02-12 NOTE — Telephone Encounter (Unsigned)
Spoke with the patient and he is scheduled with Dr. Lucky Cowboy on 02/22/22 with a 8:15 am arrival time to the MM. Pre-procedure instructions were discussed and will be mailed. Patient was offered 02/15/22 and 02/19/22 and declined both.

## 2022-02-13 DIAGNOSIS — E785 Hyperlipidemia, unspecified: Secondary | ICD-10-CM | POA: Diagnosis not present

## 2022-02-13 DIAGNOSIS — I10 Essential (primary) hypertension: Secondary | ICD-10-CM | POA: Diagnosis not present

## 2022-02-13 DIAGNOSIS — I739 Peripheral vascular disease, unspecified: Secondary | ICD-10-CM | POA: Diagnosis not present

## 2022-02-13 DIAGNOSIS — I2510 Atherosclerotic heart disease of native coronary artery without angina pectoris: Secondary | ICD-10-CM | POA: Diagnosis not present

## 2022-02-19 DIAGNOSIS — J449 Chronic obstructive pulmonary disease, unspecified: Secondary | ICD-10-CM | POA: Diagnosis not present

## 2022-02-22 ENCOUNTER — Encounter
Admission: RE | Disposition: A | Discharge status: Home / Self Care | Payer: Self-pay | Source: Home / Self Care | Attending: Vascular Surgery

## 2022-02-22 ENCOUNTER — Encounter: Payer: Self-pay | Admitting: Vascular Surgery

## 2022-02-22 ENCOUNTER — Ambulatory Visit
Admission: RE | Admit: 2022-02-22 | Discharge: 2022-02-22 | Disposition: A | Discharge status: Home / Self Care | Payer: PPO | Attending: Vascular Surgery | Admitting: Vascular Surgery

## 2022-02-22 DIAGNOSIS — Z79899 Other long term (current) drug therapy: Secondary | ICD-10-CM | POA: Insufficient documentation

## 2022-02-22 DIAGNOSIS — Z95828 Presence of other vascular implants and grafts: Secondary | ICD-10-CM

## 2022-02-22 DIAGNOSIS — F17210 Nicotine dependence, cigarettes, uncomplicated: Secondary | ICD-10-CM | POA: Diagnosis not present

## 2022-02-22 DIAGNOSIS — I10 Essential (primary) hypertension: Secondary | ICD-10-CM | POA: Insufficient documentation

## 2022-02-22 DIAGNOSIS — E782 Mixed hyperlipidemia: Secondary | ICD-10-CM | POA: Insufficient documentation

## 2022-02-22 DIAGNOSIS — I70219 Atherosclerosis of native arteries of extremities with intermittent claudication, unspecified extremity: Secondary | ICD-10-CM

## 2022-02-22 DIAGNOSIS — Z7902 Long term (current) use of antithrombotics/antiplatelets: Secondary | ICD-10-CM | POA: Diagnosis not present

## 2022-02-22 DIAGNOSIS — Z7982 Long term (current) use of aspirin: Secondary | ICD-10-CM | POA: Diagnosis not present

## 2022-02-22 DIAGNOSIS — I70213 Atherosclerosis of native arteries of extremities with intermittent claudication, bilateral legs: Secondary | ICD-10-CM | POA: Diagnosis not present

## 2022-02-22 DIAGNOSIS — I701 Atherosclerosis of renal artery: Secondary | ICD-10-CM | POA: Diagnosis not present

## 2022-02-22 HISTORY — PX: LOWER EXTREMITY ANGIOGRAPHY: CATH118251

## 2022-02-22 LAB — CREATININE, SERUM
Creatinine, Ser: 0.83 mg/dL (ref 0.61–1.24)
GFR, Estimated: >60 mL/min (ref >60)

## 2022-02-22 LAB — BUN: BUN: 17 mg/dL (ref 8–23)

## 2022-02-22 SURGERY — LOWER EXTREMITY ANGIOGRAPHY
Anesthesia: Moderate Sedation | Site: Leg Lower | Laterality: Right

## 2022-02-22 MED ORDER — FENTANYL CITRATE (PF) 100 MCG/2ML IJ SOLN
INTRAMUSCULAR | Status: DC | PRN
  Administered 2022-02-22: 50 ug via INTRAVENOUS
  Administered 2022-02-22: 25 ug

## 2022-02-22 MED ORDER — METHYLPREDNISOLONE SODIUM SUCC 125 MG IJ SOLR
INTRAMUSCULAR | Status: AC
  Administered 2022-02-22: 125 mg via INTRAVENOUS
  Filled 2022-02-22: qty 2

## 2022-02-22 MED ORDER — SODIUM CHLORIDE 0.9 % IV SOLN: INTRAVENOUS | Status: DC

## 2022-02-22 MED ORDER — HYDROMORPHONE HCL 1 MG/ML IJ SOLN: 1.0000 mg | Freq: Once | INTRAMUSCULAR | Status: DC | PRN

## 2022-02-22 MED ORDER — FAMOTIDINE 20 MG PO TABS: 40.0000 mg | ORAL_TABLET | Freq: Once | ORAL | Status: AC | PRN

## 2022-02-22 MED ORDER — METHYLPREDNISOLONE SODIUM SUCC 125 MG IJ SOLR: 125.0000 mg | Freq: Once | INTRAMUSCULAR | Status: AC | PRN

## 2022-02-22 MED ORDER — ACETAMINOPHEN 325 MG PO TABS: 650.0000 mg | ORAL_TABLET | ORAL | Status: DC | PRN

## 2022-02-22 MED ORDER — DIPHENHYDRAMINE HCL 50 MG/ML IJ SOLN: 50.0000 mg | Freq: Once | INTRAMUSCULAR | Status: AC | PRN

## 2022-02-22 MED ORDER — ONDANSETRON HCL 4 MG/2ML IJ SOLN: 4.0000 mg | Freq: Four times a day (QID) | INTRAMUSCULAR | Status: DC | PRN

## 2022-02-22 MED ORDER — FAMOTIDINE 20 MG PO TABS
ORAL_TABLET | ORAL | Status: AC
  Administered 2022-02-22: 40 mg via ORAL
  Filled 2022-02-22: qty 2

## 2022-02-22 MED ORDER — ONDANSETRON HCL 4 MG/2ML IJ SOLN
4.0000 mg | Freq: Four times a day (QID) | INTRAMUSCULAR | Status: DC | PRN
Start: 2022-02-22 — End: 2022-02-22

## 2022-02-22 MED ORDER — CEFAZOLIN SODIUM-DEXTROSE 1-4 GM/50ML-% IV SOLN
INTRAVENOUS | Status: DC | PRN
  Administered 2022-02-22: 2 g via INTRAVENOUS

## 2022-02-22 MED ORDER — CEFAZOLIN SODIUM-DEXTROSE 2-4 GM/100ML-% IV SOLN: 2.0000 g | INTRAVENOUS | Status: DC

## 2022-02-22 MED ORDER — FENTANYL CITRATE (PF) 100 MCG/2ML IJ SOLN
INTRAMUSCULAR | Status: AC
  Filled 2022-02-22: qty 2

## 2022-02-22 MED ORDER — CEFAZOLIN SODIUM-DEXTROSE 2-4 GM/100ML-% IV SOLN
INTRAVENOUS | Status: AC
  Filled 2022-02-22: qty 100

## 2022-02-22 MED ORDER — SODIUM CHLORIDE 0.9% FLUSH: 3.0000 mL | Freq: Two times a day (BID) | INTRAVENOUS | Status: DC

## 2022-02-22 MED ORDER — MIDAZOLAM HCL 2 MG/2ML IJ SOLN
INTRAMUSCULAR | Status: DC | PRN
  Administered 2022-02-22: .5 mg
  Administered 2022-02-22: 2 mg via INTRAVENOUS

## 2022-02-22 MED ORDER — STERILE WATER FOR INJECTION IJ SOLN
INTRAMUSCULAR | Status: AC
  Filled 2022-02-22: qty 10

## 2022-02-22 MED ORDER — HEPARIN SODIUM (PORCINE) 1000 UNIT/ML IJ SOLN
INTRAMUSCULAR | Status: AC
  Filled 2022-02-22: qty 10

## 2022-02-22 MED ORDER — LABETALOL HCL 5 MG/ML IV SOLN: 10.0000 mg | INTRAVENOUS | Status: DC | PRN

## 2022-02-22 MED ORDER — MIDAZOLAM HCL 2 MG/ML PO SYRP: 8.0000 mg | ORAL_SOLUTION | Freq: Once | ORAL | Status: DC | PRN

## 2022-02-22 MED ORDER — HYDRALAZINE HCL 20 MG/ML IJ SOLN: 5.0000 mg | INTRAMUSCULAR | Status: DC | PRN

## 2022-02-22 MED ORDER — SODIUM CHLORIDE 0.9 % IV SOLN: 250.0000 mL | INTRAVENOUS | Status: DC | PRN

## 2022-02-22 MED ORDER — MIDAZOLAM HCL 5 MG/5ML IJ SOLN
INTRAMUSCULAR | Status: AC
  Filled 2022-02-22: qty 5

## 2022-02-22 MED ORDER — DIPHENHYDRAMINE HCL 50 MG/ML IJ SOLN
INTRAMUSCULAR | Status: AC
  Administered 2022-02-22: 50 mg via INTRAVENOUS
  Filled 2022-02-22: qty 1

## 2022-02-22 MED ORDER — SODIUM CHLORIDE 0.9% FLUSH: 3.0000 mL | INTRAVENOUS | Status: DC | PRN

## 2022-02-22 MED ORDER — HEPARIN SODIUM (PORCINE) 1000 UNIT/ML IJ SOLN
INTRAMUSCULAR | Status: DC | PRN
  Administered 2022-02-22: 5000 [IU] via INTRAVENOUS

## 2022-02-22 SURGICAL SUPPLY — 26 items
BALLN LUTONIX 018 5X150X130 (BALLOONS) ×1
BALLN LUTONIX 018 5X60X130 (BALLOONS) ×1
BALLN LUTONIX 5X220X130 (BALLOONS) ×1
BALLN ULTRVRSE 9X80X75 (BALLOONS) ×1
BALLOON LUTONIX 018 5X150X130 (BALLOONS) IMPLANT
BALLOON LUTONIX 018 5X60X130 (BALLOONS) IMPLANT
BALLOON LUTONIX 5X220X130 (BALLOONS) IMPLANT
BALLOON ULTRVRSE 9X80X75 (BALLOONS) IMPLANT
CANNULA 5F STIFF (CANNULA) IMPLANT
CATH ANGIO 5F PIGTAIL 65CM (CATHETERS) IMPLANT
CATH VERT 5X100 (CATHETERS) IMPLANT
DEVICE STARCLOSE SE CLOSURE (Vascular Products) IMPLANT
DEVICE TORQUE .025-.038 (MISCELLANEOUS) IMPLANT
GLIDEWIRE ADV .035X260CM (WIRE) IMPLANT
KIT ENCORE 26 ADVANTAGE (KITS) IMPLANT
PACK ANGIOGRAPHY (CUSTOM PROCEDURE TRAY) ×1 IMPLANT
SHEATH BRITE TIP 5FRX11 (SHEATH) IMPLANT
SHEATH HIGHFLEX ANSEL 6FRX55 (SHEATH) IMPLANT
SHEATH PROBE COVER 6X72 (BAG) IMPLANT
STENT LIFESTAR 10X80 (Permanent Stent) IMPLANT
STENT VIABAHN 6X150X120 (Permanent Stent) IMPLANT
STENT VIABAHN 6X50X120 (Permanent Stent) IMPLANT
SYR MEDRAD MARK 7 150ML (SYRINGE) IMPLANT
TUBING CONTRAST HIGH PRESS 72 (TUBING) IMPLANT
WIRE G V18X300CM (WIRE) IMPLANT
WIRE GUIDERIGHT .035X150 (WIRE) IMPLANT

## 2022-02-22 NOTE — Interval H&P Note (Signed)
History and Physical Interval Note:  02/22/2022 8:02 AM  Aaron [name]  has presented today for surgery, with the diagnosis of RLE Angio  BARD    ASO w claudication.  The various methods of treatment have been discussed with the patient and family. After consideration of risks, benefits and other options for treatment, the patient has consented to  Procedure(s): Lower Extremity Angiography (Right) as a surgical intervention.  The patient's history has been reviewed, patient examined, no change in status, stable for surgery.  I have reviewed the patient's chart and labs.  Questions were answered to the patient's satisfaction.     [name]

## 2022-02-22 NOTE — Op Note (Signed)
[facility]  Percutaneous Study/Intervention Procedural Note   Date of Surgery: 02/22/2022  Surgeon(s):Amandamarie Feggins    Assistants:none  Pre-operative Diagnosis: PAD with claudication BLE  Post-operative diagnosis:  Same  Procedure(s) Performed:             1.  Ultrasound guidance for vascular access left femoral artery             2.  Catheter placement into right common femoral artery from left femoral approach             3.  Aortogram and selective right lower extremity angiogram             4.  Percutaneous transluminal angioplasty of right SFA and popliteal artery with 5 mm diameter by 22 cm length Lutonix drug-coated angioplasty balloon             5.  Stent placement x2 to the right SFA and popliteal artery with 6 mm diameter by 15 cm length and 6 mm diameter by 5 cm length Viabahn stents  6.  Stent placement to the right external iliac artery with 10 mm diameter by 8 cm length life star stent             7.  StarClose closure device left femoral artery  EBL: 5 cc  Contrast: 65 cc  Fluoro Time: 7.9 minutes  Moderate Conscious Sedation Time: approximately 45 minutes using 2.5 mg of Versed and 5 mcg of Fentanyl              Indications:  Patient is a 66 y.o.male with recurrent disabling claudication symptoms in both lower extremities. The patient has noninvasive study showing stenosis below his previously placed stents on the right and stenosis in the left common femoral artery with drops in his ABI down in the 0.7 range bilaterally. The patient is brought in for angiography for further evaluation and potential treatment.  Risks and benefits are discussed and informed consent is obtained.   Procedure:  The patient was identified and appropriate procedural time out was performed.  The patient was then placed supine on the table and prepped and draped in the usual sterile fashion. Moderate conscious sedation was administered during a face to face encounter  with the patient throughout the procedure with my supervision of the RN administering medicines and monitoring the patient's vital signs, pulse oximetry, telemetry and mental status throughout from the start of the procedure until the patient was taken to the recovery room. Ultrasound was used to evaluate the left common femoral artery.  It was patent but heavily diseased.  A digital ultrasound image was acquired.  A Seldinger needle was used to access the left common femoral artery under direct ultrasound guidance and a permanent image was performed.  A 0.035 J wire was advanced without resistance and a 5Fr sheath was placed.  Pigtail catheter was placed into the aorta and an AP aortogram was performed. This demonstrated what appeared to be a significant right renal artery stenosis.  The left renal artery did not appear to have a high-grade stenosis.  The aorta was normal.  The previously placed stents in the right common and the left common and external iliac arteries appear to be patent.  In the right external iliac artery there is about a 60 to 70% stenosis.  No hemodynamically significant stenosis was identified in the left iliac arteries.  The left common femoral artery did appear to have a greater than 75% stenosis. I  then crossed the aortic bifurcation and advanced to the right femoral head. Selective right lower extremity angiogram was then performed. This demonstrated right common femoral artery and profunda femoris artery to be patent.  The proximal and mid right SFA was patent including the previously placed stent.  Below the stent, and the distal popliteal artery and the above-knee popliteal artery there was heavy disease which was highly calcific with the tightest stenosis in the above-knee popliteal artery in the 90% range.  There was a normal tibial trifurcation both the anterior tibial and posterior tibial arteries were continuous to the foot without obvious focal stenosis. It was felt that it was  in the patient's best interest to proceed with intervention after these images to avoid a second procedure and a larger amount of contrast and fluoroscopy based off of the findings from the initial angiogram. The patient was systemically heparinized and a 6 [place] Ansell sheath was then placed over the [employer] wire. I then used a Kumpe catheter and the advantage wire to navigate through the right SFA and popliteal lesions with minimal difficulty.  We treated the lesion with a 5 mm diameter by 22 cm length Lutonix drug-coated angioplasty balloon inflated to 12 atm for 1 minute.  Completion imaging showed significant residual disease throughout the area treated requiring stent placement.  We exchanged for a 0.018 wire and placed a 6 mm diameter by 15 cm length Viabahn stent but this was slightly short so an additional 6 mm diameter by 5 cm length Viabahn stent was used to bridge to the previously placed stent and extend down to the mid popliteal artery to encompass the entirety of the lesions.  These were postdilated with 5 mm balloons with excellent angiographic lesion result and less than 10% residual stenosis.  The sheath was then pulled back to the right common iliac artery and oblique angiogram of the right external iliac artery demonstrated the significant stenosis in the 60 to 70% range in the area was highly calcific over about a 5 to 6 cm span.  A 10 mm diameter by 8 cm length life star stent was bridged up to the previously placed stent in the distal common iliac artery and down to the mid to distal external iliac artery.  This was then postdilated with a 9 mm diameter balloon with excellent angiographic completion result and less than 10% residual stenosis. I elected to terminate the procedure. The sheath was removed and StarClose closure device was deployed in the left femoral artery with excellent hemostatic result. The patient was taken to the recovery room in stable condition having tolerated  the procedure well.  Findings:               Aortogram: This demonstrated what appeared to be a significant right renal artery stenosis.  The left renal artery did not appear to have a high-grade stenosis.  The aorta was normal.  The previously placed stents in the right common and the left common and external iliac arteries appear to be patent.  In the right external iliac artery there is about a 60 to 70% stenosis.  No hemodynamically significant stenosis was identified in the left iliac arteries.  The left common femoral artery did appear to have a greater than 75% stenosis.             Right Lower Extremity:  This demonstrated right common femoral artery and profunda femoris artery to be patent.  The proximal and mid right SFA was patent  including the previously placed stent.  Below the stent, and the distal popliteal artery and the above-knee popliteal artery there was heavy disease which was highly calcific with the tightest stenosis in the above-knee popliteal artery in the 90% range.  There was a normal tibial trifurcation both the anterior tibial and posterior tibial arteries were continuous to the foot without obvious focal stenosis.   Disposition: Patient was taken to the recovery room in stable condition having tolerated the procedure well.  Complications: None  [name] 02/22/2022 10:47 AM   This note was created with Dragon Medical transcription system. Any errors in dictation are purely unintentional.

## 2022-02-23 ENCOUNTER — Encounter: Payer: Self-pay | Admitting: Vascular Surgery

## 2022-02-23 ENCOUNTER — Ambulatory Visit (INDEPENDENT_AMBULATORY_CARE_PROVIDER_SITE_OTHER): Payer: PPO | Admitting: Vascular Surgery

## 2022-02-26 ENCOUNTER — Telehealth (INDEPENDENT_AMBULATORY_CARE_PROVIDER_SITE_OTHER): Payer: Self-pay | Admitting: Vascular Surgery

## 2022-02-26 NOTE — Telephone Encounter (Signed)
Patient called in stating that he is having pain in right leg its swollen redness a lot of pain in the upper thigh calf and left leg is swollen also. Patient woke up Saturday morning this way. Not sure if he did something wrong. Procedure was done on 02/22/2022.   Patient is wanting some advise on what this could be and what he could do at home     Please call and advise

## 2022-02-26 NOTE — Telephone Encounter (Signed)
He had a lot of work in the right leg and so swelling and discomfort is normal.  This is due to having increased blood flow to the leg and also due to some stretching and manipulation of the arteries.  He can elevate his lower extremities and he can take some ibuprofen or tylenol

## 2022-02-27 NOTE — Telephone Encounter (Signed)
Patient was made aware with medical recommendations  

## 2022-03-19 ENCOUNTER — Other Ambulatory Visit (INDEPENDENT_AMBULATORY_CARE_PROVIDER_SITE_OTHER): Payer: Self-pay | Admitting: Vascular Surgery

## 2022-03-19 DIAGNOSIS — Z9889 Other specified postprocedural states: Secondary | ICD-10-CM

## 2022-03-20 ENCOUNTER — Encounter (INDEPENDENT_AMBULATORY_CARE_PROVIDER_SITE_OTHER): Payer: Self-pay | Admitting: Vascular Surgery

## 2022-03-20 ENCOUNTER — Telehealth: Payer: Self-pay

## 2022-03-20 ENCOUNTER — Ambulatory Visit (INDEPENDENT_AMBULATORY_CARE_PROVIDER_SITE_OTHER): Payer: PPO | Admitting: Vascular Surgery

## 2022-03-20 ENCOUNTER — Ambulatory Visit (INDEPENDENT_AMBULATORY_CARE_PROVIDER_SITE_OTHER): Payer: PPO

## 2022-03-20 VITALS — BP 103/58 | HR 81 | Resp 16 | Wt 194.0 lb

## 2022-03-20 DIAGNOSIS — E782 Mixed hyperlipidemia: Secondary | ICD-10-CM

## 2022-03-20 DIAGNOSIS — I739 Peripheral vascular disease, unspecified: Secondary | ICD-10-CM

## 2022-03-20 DIAGNOSIS — Z9889 Other specified postprocedural states: Secondary | ICD-10-CM

## 2022-03-20 DIAGNOSIS — I10 Essential (primary) hypertension: Secondary | ICD-10-CM

## 2022-03-20 DIAGNOSIS — I70211 Atherosclerosis of native arteries of extremities with intermittent claudication, right leg: Secondary | ICD-10-CM

## 2022-03-20 NOTE — Progress Notes (Signed)
MRN : [id number]  Aaron [name] is a 66 y.o. (09/07/1955) male who presents with chief complaint of  Chief Complaint  Patient presents with   Follow-up    [facility] 3 week follow up  .  History of Present Illness: Patient returns today in follow up of his PAD.  About a month ago, he underwent extensive right lower extremity revascularization for disabling claudication symptoms of the right leg.  He had typical reperfusion swelling and still has a little bit of swelling in the right leg but the right leg feels much better.  He is now able to walk without any claudication symptoms.  No rest pain or shortness of breath on the right.  He is now noticing pain in his left upper thigh even up into his hip and buttock area and then radiating down his legs with short distances of activity.  He was noted to have an at least 75% stenosis of his left common femoral artery at the time of his right lower extremity angiogram.  His ABIs today are significantly improved up to 0.96 with triphasic waveforms on the right.  His left ABI is stable at 0.82 with monophasic to biphasic waveforms.  Current Outpatient Medications  Medication Sig Dispense Refill   albuterol (VENTOLIN HFA) 108 (90 Base) MCG/ACT inhaler Inhale 2 puffs into the lungs every 6 (six) hours as needed for wheezing or shortness of breath. 6.7 g 5   aspirin EC 81 MG tablet Take 1 tablet (81 mg total) by mouth daily. (Patient taking differently: Take 325 mg by mouth daily.) 150 tablet 2   cilostazol (PLETAL) 50 MG tablet Take by mouth 2 (two) times daily.      clopidogrel (PLAVIX) 75 MG tablet Take 1 tablet by mouth daily.     indomethacin (INDOCIN) 25 MG capsule Take 1 capsule (25 mg total) by mouth 2 (two) times daily as needed. 30 capsule 1   isosorbide mononitrate (IMDUR) 120 MG 24 hr tablet Take 120 mg by mouth daily.     lisinopril (PRINIVIL,ZESTRIL) 10 MG tablet Take 1 tablet by mouth daily.     metoprolol tartrate (LOPRESSOR) 50 MG tablet  Take 50-75 mg by mouth 2 (two) times a day. Take '50MG'$  by mouth every morning and '75MG'$  every evening     nitroGLYCERIN (NITROSTAT) 0.4 MG SL tablet Place 1 tablet under the tongue as needed.     simvastatin (ZOCOR) 40 MG tablet Take 1 tablet (40 mg total) by mouth at bedtime. 30 tablet 5   No current facility-administered medications for this visit.    Past Medical History:  Diagnosis Date   COPD (chronic obstructive pulmonary disease) ([place])    Coronary artery disease    s/p PCI   Fracture of left ankle, closed, initial encounter 10/23/2017   GERD (gastroesophageal reflux disease)    Glaucoma    Gout    Hx of heart artery stent 01/12/2017   Hypercholesteremia    Hypertension    PAD (peripheral artery disease) ([place])    Pain in limb 07/13/2016   Syncope 09/16/2017   Unstable angina ([place]) 01/12/2017   Vertigo     Past Surgical History:  Procedure Laterality Date   APPENDECTOMY     cardiac stents   2012   CATARACT EXTRACTION W/PHACO Right 10/26/2021   Procedure: CATARACT EXTRACTION PHACO AND INTRAOCULAR LENS PLACEMENT ([place]) RIGHT OMIDRIA  10.98 01:18.2;  Surgeon: [name], MD;  Location: [place];  Service: Ophthalmology;  Laterality:  Right;   CATARACT EXTRACTION W/PHACO Left 11/16/2021   Procedure: CATARACT EXTRACTION PHACO AND INTRAOCULAR LENS PLACEMENT ([place]) LEFT;  Surgeon: [name], MD;  Location: [place];  Service: Ophthalmology;  Laterality: Left;  6.91 0:46.0   COLONOSCOPY  03/30/2013   [place]   COLONOSCOPY WITH PROPOFOL N/A 12/10/2019   Procedure: COLONOSCOPY WITH PROPOFOL;  Surgeon: [name], MD;  Location: [place];  Service: Endoscopy;  Laterality: N/A;   COLONOSCOPY WITH PROPOFOL N/A 01/18/2020   Procedure: COLONOSCOPY WITH PROPOFOL;  Surgeon: [name], MD;  Location: [place];  Service: Endoscopy;  Laterality: N/A;  4   LEFT HEART CATH AND CORONARY ANGIOGRAPHY N/A 12/29/2020   Procedure: LEFT  HEART CATH AND CORONARY ANGIOGRAPHY;  Surgeon: Corey Skains, MD;  Location: [place] [facility];  Service: Cardiovascular;  Laterality: N/A;   LOWER EXTREMITY ANGIOGRAPHY Right 09/25/2018   Procedure: LOWER EXTREMITY ANGIOGRAPHY;  Surgeon: Algernon Huxley, MD;  Location: [place] [facility];  Service: Cardiovascular;  Laterality: Right;   LOWER EXTREMITY ANGIOGRAPHY Left 03/17/2020   Procedure: LOWER EXTREMITY ANGIOGRAPHY;  Surgeon: Algernon Huxley, MD;  Location: [place] [facility];  Service: Cardiovascular;  Laterality: Left;   LOWER EXTREMITY ANGIOGRAPHY Right 02/22/2022   Procedure: Lower Extremity Angiography;  Surgeon: Algernon Huxley, MD;  Location: [place] [facility];  Service: Cardiovascular;  Laterality: Right;   POLYPECTOMY N/A 01/18/2020   Procedure: POLYPECTOMY;  Surgeon: [name], MD;  Location: [place];  Service: Endoscopy;  Laterality: N/A;     Social History   Tobacco Use   Smoking status: Every Day    Packs/day: 1.00    Years: 46.00    Total pack years: 46.00    Types: Cigarettes   Smokeless tobacco: Never   Tobacco comments:    Pt down to 1/2 PPD 05/24/21  Vaping Use   Vaping Use: Never used  Substance Use Topics   Alcohol use: Yes    Comment: quit drinking around 04/2019(May have drink 1x/mo)   Drug use: No     Family History  Problem Relation Age of Onset   Hypertension Mother    Diabetes Mother    Heart failure Father        age 31   Cancer Father    Ovarian cancer Sister    Heart disease Brother      Allergies  Allergen Reactions   Contrast Media [Iodinated Contrast Media] Hives   Flexeril [Cyclobenzaprine] Other (See Comments)    Urinary retention, constipation   Iodine Hives     REVIEW OF SYSTEMS (Negative unless checked)  Constitutional: '[]'$ Weight loss  '[]'$ Fever  '[]'$ Chills Cardiac: '[]'$ Chest pain   '[]'$ Chest pressure   '[]'$ Palpitations   '[]'$ Shortness of breath when laying flat   '[]'$ Shortness of breath at rest   '[]'$ Shortness of breath  with exertion. Vascular:  '[x]'$ Pain in legs with walking   '[]'$ Pain in legs at rest   '[]'$ Pain in legs when laying flat   '[x]'$ Claudication   '[]'$ Pain in feet when walking  '[]'$ Pain in feet at rest  '[]'$ Pain in feet when laying flat   '[]'$ History of DVT   '[]'$ Phlebitis   '[x]'$ Swelling in legs   '[]'$ Varicose veins   '[]'$ Non-healing ulcers Pulmonary:   '[]'$ Uses home oxygen   '[]'$ Productive cough   '[]'$ Hemoptysis   '[]'$ Wheeze  '[]'$ COPD   '[]'$ Asthma Neurologic:  '[]'$ Dizziness  '[]'$ Blackouts   '[]'$ Seizures   '[]'$ History of stroke   '[]'$ History of TIA  '[]'$ Aphasia   '[]'$   Temporary blindness   '[]'$ Dysphagia   '[]'$ Weakness or numbness in arms   '[]'$ Weakness or numbness in legs Musculoskeletal:  '[x]'$ Arthritis   '[]'$ Joint swelling   '[x]'$ Joint pain   '[]'$ Low back pain Hematologic:  '[]'$ Easy bruising  '[]'$ Easy bleeding   '[]'$ Hypercoagulable state   '[]'$ Anemic   Gastrointestinal:  '[]'$ Blood in stool   '[]'$ Vomiting blood  '[]'$ Gastroesophageal reflux/heartburn   '[]'$ Abdominal pain Genitourinary:  '[]'$ Chronic kidney disease   '[]'$ Difficult urination  '[]'$ Frequent urination  '[]'$ Burning with urination   '[]'$ Hematuria Skin:  '[]'$ Rashes   '[]'$ Ulcers   '[]'$ Wounds Psychological:  '[]'$ History of anxiety   '[]'$  History of major depression.  Physical Examination  BP (!) 103/58 (BP Location: Left Arm)   Pulse 81   Resp 16   Wt 194 lb (88 kg)   BMI 25.60 kg/m  Gen:  WD/WN, NAD Head: [place]/AT, No temporalis wasting. Ear/Nose/Throat: Hearing grossly intact, nares w/o erythema or drainage Eyes: Conjunctiva clear. Sclera non-icteric Neck: Supple.  Trachea midline Pulmonary:  Good air movement, no use of accessory muscles.  Cardiac: RRR, no JVD Vascular:  Vessel Right Left  Radial Palpable Palpable                          PT 2+ palpable 1+ palpable  DP 2+ palpable 1+ palpable   Gastrointestinal: soft, non-tender/non-distended. No guarding/reflex.  Musculoskeletal: M/S 5/5 throughout.  No deformity or atrophy.  Trace right lower extremity edema. Neurologic: Sensation grossly intact in extremities.   Symmetrical.  Speech is fluent.  Psychiatric: Judgment intact, Mood & affect appropriate for pt's clinical situation. Dermatologic: No rashes or ulcers noted.  No cellulitis or open wounds.      Labs Recent Results (from the past 2160 hour(s))  BUN     Status: None   Collection Time: 02/22/22  8:44 AM  Result Value Ref Range   BUN 17 8 - 23 mg/dL    Comment: Performed at [facility], [address] [place], [place] [address]  Creatinine, serum     Status: None   Collection Time: 02/22/22  8:44 AM  Result Value Ref Range   Creatinine, Ser 0.83 0.61 - 1.24 mg/dL   GFR, Estimated >60 >60 mL/min    Comment: (NOTE) Calculated using the CKD-EPI Creatinine Equation (2021) Performed at [facility] [address], [place], [place] [address]     Radiology PERIPHERAL VASCULAR CATHETERIZATION  Result Date: 02/22/2022 See surgical note for result.   Assessment/Plan Essential (primary) hypertension blood pressure control important in reducing the progression of atherosclerotic disease. On appropriate oral medications.     Mixed hyperlipidemia lipid control important in reducing the progression of atherosclerotic disease. Continue statin therapy  Atherosclerosis of native arteries of extremity with intermittent claudication ([place]) He was noted to have an at least 75% stenosis of his left common femoral artery at the time of his right lower extremity angiogram.  His ABIs today are significantly improved up to 0.96 with triphasic waveforms on the right.  His left ABI is stable at 0.82 with monophasic to biphasic waveforms. We had a long discussion today about treatment options.  His right leg is markedly improved after intervention but now he has disabling symptoms of left.  He has a known left common femoral artery high-grade stenosis and this will require a femoral endarterectomy and not percutaneous therapy.  He may also have infrainguinal disease and we can  evaluate this with an angiogram at the time of the endarterectomy if  he so desires.  His symptoms are so disabling he wants to have this treated and desires to proceed with left femoral endarterectomy and possible left lower extremity infrainguinal revascularization depending on the angiogram.  Risks and benefits were discussed.  It was discussed that this was significantly more involved than his previous angiograms and would be a surgical therapy with a hospital stay and a longer recovery.  He voices understanding.    [name], MD  03/20/2022 3:15 PM    This note was created with Dragon medical transcription system.  Any errors from dictation are purely unintentional

## 2022-03-20 NOTE — H&P (View-Only) (Signed)
MRN : [id number]  Aaron [name] is a 66 y.o. (04-07-1956) male who presents with chief complaint of  Chief Complaint  Patient presents with   Follow-up    [facility] 3 week follow up  .  History of Present Illness: Patient returns today in follow up of his PAD.  About a month ago, he underwent extensive right lower extremity revascularization for disabling claudication symptoms of the right leg.  He had typical reperfusion swelling and still has a little bit of swelling in the right leg but the right leg feels much better.  He is now able to walk without any claudication symptoms.  No rest pain or shortness of breath on the right.  He is now noticing pain in his left upper thigh even up into his hip and buttock area and then radiating down his legs with short distances of activity.  He was noted to have an at least 75% stenosis of his left common femoral artery at the time of his right lower extremity angiogram.  His ABIs today are significantly improved up to 0.96 with triphasic waveforms on the right.  His left ABI is stable at 0.82 with monophasic to biphasic waveforms.  Current Outpatient Medications  Medication Sig Dispense Refill   albuterol (VENTOLIN HFA) 108 (90 Base) MCG/ACT inhaler Inhale 2 puffs into the lungs every 6 (six) hours as needed for wheezing or shortness of breath. 6.7 g 5   aspirin EC 81 MG tablet Take 1 tablet (81 mg total) by mouth daily. (Patient taking differently: Take 325 mg by mouth daily.) 150 tablet 2   cilostazol (PLETAL) 50 MG tablet Take by mouth 2 (two) times daily.      clopidogrel (PLAVIX) 75 MG tablet Take 1 tablet by mouth daily.     indomethacin (INDOCIN) 25 MG capsule Take 1 capsule (25 mg total) by mouth 2 (two) times daily as needed. 30 capsule 1   isosorbide mononitrate (IMDUR) 120 MG 24 hr tablet Take 120 mg by mouth daily.     lisinopril (PRINIVIL,ZESTRIL) 10 MG tablet Take 1 tablet by mouth daily.     metoprolol tartrate (LOPRESSOR) 50 MG tablet  Take 50-75 mg by mouth 2 (two) times a day. Take '50MG'$  by mouth every morning and '75MG'$  every evening     nitroGLYCERIN (NITROSTAT) 0.4 MG SL tablet Place 1 tablet under the tongue as needed.     simvastatin (ZOCOR) 40 MG tablet Take 1 tablet (40 mg total) by mouth at bedtime. 30 tablet 5   No current facility-administered medications for this visit.    Past Medical History:  Diagnosis Date   COPD (chronic obstructive pulmonary disease) ([place])    Coronary artery disease    s/p PCI   Fracture of left ankle, closed, initial encounter 10/23/2017   GERD (gastroesophageal reflux disease)    Glaucoma    Gout    Hx of heart artery stent 01/12/2017   Hypercholesteremia    Hypertension    PAD (peripheral artery disease) ([place])    Pain in limb 07/13/2016   Syncope 09/16/2017   Unstable angina ([place]) 01/12/2017   Vertigo     Past Surgical History:  Procedure Laterality Date   APPENDECTOMY     cardiac stents   2012   CATARACT EXTRACTION W/PHACO Right 10/26/2021   Procedure: CATARACT EXTRACTION PHACO AND INTRAOCULAR LENS PLACEMENT ([place]) RIGHT OMIDRIA  10.98 01:18.2;  Surgeon: [name], MD;  Location: [place];  Service: Ophthalmology;  Laterality:  Right;   CATARACT EXTRACTION W/PHACO Left 11/16/2021   Procedure: CATARACT EXTRACTION PHACO AND INTRAOCULAR LENS PLACEMENT ([place]) LEFT;  Surgeon: [name], MD;  Location: [place];  Service: Ophthalmology;  Laterality: Left;  6.91 0:46.0   COLONOSCOPY  03/30/2013   [place]   COLONOSCOPY WITH PROPOFOL N/A 12/10/2019   Procedure: COLONOSCOPY WITH PROPOFOL;  Surgeon: [name], MD;  Location: [place];  Service: Endoscopy;  Laterality: N/A;   COLONOSCOPY WITH PROPOFOL N/A 01/18/2020   Procedure: COLONOSCOPY WITH PROPOFOL;  Surgeon: [name], MD;  Location: [place];  Service: Endoscopy;  Laterality: N/A;  4   LEFT HEART CATH AND CORONARY ANGIOGRAPHY N/A 12/29/2020   Procedure: LEFT  HEART CATH AND CORONARY ANGIOGRAPHY;  Surgeon: Corey Skains, MD;  Location: [place] [facility];  Service: Cardiovascular;  Laterality: N/A;   LOWER EXTREMITY ANGIOGRAPHY Right 09/25/2018   Procedure: LOWER EXTREMITY ANGIOGRAPHY;  Surgeon: Algernon Huxley, MD;  Location: [place] [facility];  Service: Cardiovascular;  Laterality: Right;   LOWER EXTREMITY ANGIOGRAPHY Left 03/17/2020   Procedure: LOWER EXTREMITY ANGIOGRAPHY;  Surgeon: Algernon Huxley, MD;  Location: [place] [facility];  Service: Cardiovascular;  Laterality: Left;   LOWER EXTREMITY ANGIOGRAPHY Right 02/22/2022   Procedure: Lower Extremity Angiography;  Surgeon: Algernon Huxley, MD;  Location: [place] [facility];  Service: Cardiovascular;  Laterality: Right;   POLYPECTOMY N/A 01/18/2020   Procedure: POLYPECTOMY;  Surgeon: [name], MD;  Location: [place];  Service: Endoscopy;  Laterality: N/A;     Social History   Tobacco Use   Smoking status: Every Day    Packs/day: 1.00    Years: 46.00    Total pack years: 46.00    Types: Cigarettes   Smokeless tobacco: Never   Tobacco comments:    Pt down to 1/2 PPD 05/24/21  Vaping Use   Vaping Use: Never used  Substance Use Topics   Alcohol use: Yes    Comment: quit drinking around 04/2019(May have drink 1x/mo)   Drug use: No     Family History  Problem Relation Age of Onset   Hypertension Mother    Diabetes Mother    Heart failure Father        age 84   Cancer Father    Ovarian cancer Sister    Heart disease Brother      Allergies  Allergen Reactions   Contrast Media [Iodinated Contrast Media] Hives   Flexeril [Cyclobenzaprine] Other (See Comments)    Urinary retention, constipation   Iodine Hives     REVIEW OF SYSTEMS (Negative unless checked)  Constitutional: '[]'$ Weight loss  '[]'$ Fever  '[]'$ Chills Cardiac: '[]'$ Chest pain   '[]'$ Chest pressure   '[]'$ Palpitations   '[]'$ Shortness of breath when laying flat   '[]'$ Shortness of breath at rest   '[]'$ Shortness of breath  with exertion. Vascular:  '[x]'$ Pain in legs with walking   '[]'$ Pain in legs at rest   '[]'$ Pain in legs when laying flat   '[x]'$ Claudication   '[]'$ Pain in feet when walking  '[]'$ Pain in feet at rest  '[]'$ Pain in feet when laying flat   '[]'$ History of DVT   '[]'$ Phlebitis   '[x]'$ Swelling in legs   '[]'$ Varicose veins   '[]'$ Non-healing ulcers Pulmonary:   '[]'$ Uses home oxygen   '[]'$ Productive cough   '[]'$ Hemoptysis   '[]'$ Wheeze  '[]'$ COPD   '[]'$ Asthma Neurologic:  '[]'$ Dizziness  '[]'$ Blackouts   '[]'$ Seizures   '[]'$ History of stroke   '[]'$ History of TIA  '[]'$ Aphasia   '[]'$   Temporary blindness   '[]'$ Dysphagia   '[]'$ Weakness or numbness in arms   '[]'$ Weakness or numbness in legs Musculoskeletal:  '[x]'$ Arthritis   '[]'$ Joint swelling   '[x]'$ Joint pain   '[]'$ Low back pain Hematologic:  '[]'$ Easy bruising  '[]'$ Easy bleeding   '[]'$ Hypercoagulable state   '[]'$ Anemic   Gastrointestinal:  '[]'$ Blood in stool   '[]'$ Vomiting blood  '[]'$ Gastroesophageal reflux/heartburn   '[]'$ Abdominal pain Genitourinary:  '[]'$ Chronic kidney disease   '[]'$ Difficult urination  '[]'$ Frequent urination  '[]'$ Burning with urination   '[]'$ Hematuria Skin:  '[]'$ Rashes   '[]'$ Ulcers   '[]'$ Wounds Psychological:  '[]'$ History of anxiety   '[]'$  History of major depression.  Physical Examination  BP (!) 103/58 (BP Location: Left Arm)   Pulse 81   Resp 16   Wt 194 lb (88 kg)   BMI 25.60 kg/m  Gen:  WD/WN, NAD Head: [place]/AT, No temporalis wasting. Ear/Nose/Throat: Hearing grossly intact, nares w/o erythema or drainage Eyes: Conjunctiva clear. Sclera non-icteric Neck: Supple.  Trachea midline Pulmonary:  Good air movement, no use of accessory muscles.  Cardiac: RRR, no JVD Vascular:  Vessel Right Left  Radial Palpable Palpable                          PT 2+ palpable 1+ palpable  DP 2+ palpable 1+ palpable   Gastrointestinal: soft, non-tender/non-distended. No guarding/reflex.  Musculoskeletal: M/S 5/5 throughout.  No deformity or atrophy.  Trace right lower extremity edema. Neurologic: Sensation grossly intact in extremities.   Symmetrical.  Speech is fluent.  Psychiatric: Judgment intact, Mood & affect appropriate for pt's clinical situation. Dermatologic: No rashes or ulcers noted.  No cellulitis or open wounds.      Labs Recent Results (from the past 2160 hour(s))  BUN     Status: None   Collection Time: 02/22/22  8:44 AM  Result Value Ref Range   BUN 17 8 - 23 mg/dL    Comment: Performed at [facility], [address] [place], [place] [address]  Creatinine, serum     Status: None   Collection Time: 02/22/22  8:44 AM  Result Value Ref Range   Creatinine, Ser 0.83 0.61 - 1.24 mg/dL   GFR, Estimated >60 >60 mL/min    Comment: (NOTE) Calculated using the CKD-EPI Creatinine Equation (2021) Performed at [facility], [address], [place], [place] [address]     Radiology PERIPHERAL VASCULAR CATHETERIZATION  Result Date: 02/22/2022 See surgical note for result.   Assessment/Plan Essential (primary) hypertension blood pressure control important in reducing the progression of atherosclerotic disease. On appropriate oral medications.     Mixed hyperlipidemia lipid control important in reducing the progression of atherosclerotic disease. Continue statin therapy  Atherosclerosis of native arteries of extremity with intermittent claudication ([place]) He was noted to have an at least 75% stenosis of his left common femoral artery at the time of his right lower extremity angiogram.  His ABIs today are significantly improved up to 0.96 with triphasic waveforms on the right.  His left ABI is stable at 0.82 with monophasic to biphasic waveforms. We had a long discussion today about treatment options.  His right leg is markedly improved after intervention but now he has disabling symptoms of left.  He has a known left common femoral artery high-grade stenosis and this will require a femoral endarterectomy and not percutaneous therapy.  He may also have infrainguinal disease and we can  evaluate this with an angiogram at the time of the endarterectomy if  he so desires.  His symptoms are so disabling he wants to have this treated and desires to proceed with left femoral endarterectomy and possible left lower extremity infrainguinal revascularization depending on the angiogram.  Risks and benefits were discussed.  It was discussed that this was significantly more involved than his previous angiograms and would be a surgical therapy with a hospital stay and a longer recovery.  He voices understanding.    [name], MD  03/20/2022 3:15 PM    This note was created with Dragon medical transcription system.  Any errors from dictation are purely unintentional

## 2022-03-20 NOTE — Assessment & Plan Note (Signed)
He was noted to have an at least 75% stenosis of his left common femoral artery at the time of his right lower extremity angiogram.  His ABIs today are significantly improved up to 0.96 with triphasic waveforms on the right.  His left ABI is stable at 0.82 with monophasic to biphasic waveforms. We had a long discussion today about treatment options.  His right leg is markedly improved after intervention but now he has disabling symptoms of left.  He has a known left common femoral artery high-grade stenosis and this will require a femoral endarterectomy and not percutaneous therapy.  He may also have infrainguinal disease and we can evaluate this with an angiogram at the time of the endarterectomy if he so desires.  His symptoms are so disabling he wants to have this treated and desires to proceed with left femoral endarterectomy and possible left lower extremity infrainguinal revascularization depending on the angiogram.  Risks and benefits were discussed.  It was discussed that this was significantly more involved than his previous angiograms and would be a surgical therapy with a hospital stay and a longer recovery.  He voices understanding.

## 2022-03-20 NOTE — Telephone Encounter (Signed)
Error.     KP 

## 2022-03-21 ENCOUNTER — Encounter: Payer: Self-pay | Admitting: Internal Medicine

## 2022-03-21 ENCOUNTER — Ambulatory Visit (INDEPENDENT_AMBULATORY_CARE_PROVIDER_SITE_OTHER): Payer: PPO | Admitting: Internal Medicine

## 2022-03-21 VITALS — BP 92/62 | HR 78 | Ht 73.0 in | Wt 194.0 lb

## 2022-03-21 DIAGNOSIS — I25119 Atherosclerotic heart disease of native coronary artery with unspecified angina pectoris: Secondary | ICD-10-CM | POA: Diagnosis not present

## 2022-03-21 DIAGNOSIS — Z23 Encounter for immunization: Secondary | ICD-10-CM | POA: Diagnosis not present

## 2022-03-21 DIAGNOSIS — I70211 Atherosclerosis of native arteries of extremities with intermittent claudication, right leg: Secondary | ICD-10-CM

## 2022-03-21 MED ORDER — ISOSORBIDE MONONITRATE ER 120 MG PO TB24: 120.0000 mg | ORAL_TABLET | Freq: Every day | ORAL | 0 refills | Status: AC

## 2022-03-21 NOTE — Progress Notes (Signed)
Date:  03/21/2022   Name:  Aaron [name]   DOB:  February 29, 1956   MRN:  [id number]   Chief Complaint: Letter for School/Work (Letter for jury duty, heart problems )  Hypertension This is a chronic problem. The problem is controlled. Pertinent negatives include no chest pain, palpitations or shortness of breath. Past treatments include beta blockers, ACE inhibitors and direct vasodilators. The current treatment provides significant improvement. Hypertensive end-organ damage includes CAD/MI and PVD.   Jury Duty - he has been called to [employer] duty for October 5.  He is probably having vascular surgery to his left LE in the next few weeks and does not want to postpone.  Also, he no longer drives and has transportation difficulties.  He would like a letter to excuse him.  Lab Results  Component Value Date   NA 134 04/05/2021   K 4.5 04/05/2021   CO2 22 04/05/2021   GLUCOSE 102 (H) 04/05/2021   BUN 17 02/22/2022   CREATININE 0.83 02/22/2022   CALCIUM 9.8 04/05/2021   EGFR 96 04/05/2021   GFRNONAA >60 02/22/2022   Lab Results  Component Value Date   CHOL 140 04/05/2021   HDL 36 (L) 04/05/2021   LDLCALC 83 04/05/2021   TRIG 114 04/05/2021   CHOLHDL 3.9 04/05/2021   Lab Results  Component Value Date   TSH 3.214 02/06/2019   Lab Results  Component Value Date   HGBA1C 5.5 08/17/2021   Lab Results  Component Value Date   WBC 6.6 12/29/2020   HGB 16.6 12/29/2020   HCT 47.9 12/29/2020   MCV 96.8 12/29/2020   PLT 181 12/29/2020   Lab Results  Component Value Date   ALT 44 04/05/2021   AST 31 04/05/2021   ALKPHOS 77 04/05/2021   BILITOT 0.6 04/05/2021   No results found for: "25OHVITD2", "25OHVITD3", "VD25OH"   Review of Systems  Constitutional:  Negative for chills, fatigue and fever.  Respiratory:  Negative for chest tightness and shortness of breath.   Cardiovascular:  Negative for chest pain and palpitations.       Claudication left leg  Musculoskeletal:  Positive  for myalgias.    Patient Active Problem List   Diagnosis Date Noted   Hepatic steatosis 01/04/2022   Aortic atherosclerosis ([place]) 07/10/2020   Stage 2 moderate COPD by GOLD classification ([place]) 05/16/2020   Dyspepsia and disorder of function of stomach 05/16/2020   Cervical disc disorder of mid-cervical region 05/10/2020   Polyp of descending colon    Mixed hyperlipidemia 10/28/2018   Atherosclerosis of native arteries of extremity with intermittent claudication ([place]) 07/13/2016   Tobacco use disorder 07/13/2016   Prediabetes 07/27/2015   Controlled gout 12/22/2014   Coronary artery disease involving native coronary artery of native heart with angina pectoris ([place]) 10/09/2014   Essential (primary) hypertension 10/09/2014   Dupuytren's disease of palm 10/09/2014   Psoriasis 10/09/2014   H/O cardiac catheterization 01/27/2014   Peripheral vascular disease ([place]) 01/27/2014    Allergies  Allergen Reactions   Contrast Media [Iodinated Contrast Media] Hives   Flexeril [Cyclobenzaprine] Other (See Comments)    Urinary retention, constipation   Iodine Hives    Past Surgical History:  Procedure Laterality Date   APPENDECTOMY     cardiac stents   2012   CATARACT EXTRACTION W/PHACO Right 10/26/2021   Procedure: CATARACT EXTRACTION PHACO AND INTRAOCULAR LENS PLACEMENT ([place]) RIGHT OMIDRIA  10.98 01:18.2;  Surgeon: [name], MD;  Location: [facility];  Service: Ophthalmology;  Laterality: Right;   CATARACT EXTRACTION W/PHACO Left 11/16/2021   Procedure: CATARACT EXTRACTION PHACO AND INTRAOCULAR LENS PLACEMENT ([place]) LEFT;  Surgeon: [name], MD;  Location: [place];  Service: Ophthalmology;  Laterality: Left;  6.91 0:46.0   COLONOSCOPY  03/30/2013   [place]   COLONOSCOPY WITH PROPOFOL N/A 12/10/2019   Procedure: COLONOSCOPY WITH PROPOFOL;  Surgeon: [name], MD;  Location: [place] ;  Service: Endoscopy;  Laterality: N/A;    COLONOSCOPY WITH PROPOFOL N/A 01/18/2020   Procedure: COLONOSCOPY WITH PROPOFOL;  Surgeon: [name], MD;  Location: [place];  Service: Endoscopy;  Laterality: N/A;  4   LEFT HEART CATH AND CORONARY ANGIOGRAPHY N/A 12/29/2020   Procedure: LEFT HEART CATH AND CORONARY ANGIOGRAPHY;  Surgeon: Corey Skains, MD;  Location: [facility];  Service: Cardiovascular;  Laterality: N/A;   LOWER EXTREMITY ANGIOGRAPHY Right 09/25/2018   Procedure: LOWER EXTREMITY ANGIOGRAPHY;  Surgeon: Algernon Huxley, MD;  Location: [place];  Service: Cardiovascular;  Laterality: Right;   LOWER EXTREMITY ANGIOGRAPHY Left 03/17/2020   Procedure: LOWER EXTREMITY ANGIOGRAPHY;  Surgeon: Algernon Huxley, MD;  Location: [facility];  Service: Cardiovascular;  Laterality: Left;   LOWER EXTREMITY ANGIOGRAPHY Right 02/22/2022   Procedure: Lower Extremity Angiography;  Surgeon: Algernon Huxley, MD;  Location: [facility];  Service: Cardiovascular;  Laterality: Right;   POLYPECTOMY N/A 01/18/2020   Procedure: POLYPECTOMY;  Surgeon: [name], MD;  Location: [place];  Service: Endoscopy;  Laterality: N/A;    Social History   Tobacco Use   Smoking status: Every Day    Packs/day: 1.00    Years: 46.00    Total pack years: 46.00    Types: Cigarettes   Smokeless tobacco: Never   Tobacco comments:    Pt down to 1/2 PPD 05/24/21  Vaping Use   Vaping Use: Never used  Substance Use Topics   Alcohol use: Yes    Comment: quit drinking around 04/2019(May have drink 1x/mo)   Drug use: No     Medication list has been reviewed and updated.  Current Meds  Medication Sig   albuterol (VENTOLIN HFA) 108 (90 Base) MCG/ACT inhaler Inhale 2 puffs into the lungs every 6 (six) hours as needed for wheezing or shortness of breath.   aspirin EC 81 MG tablet Take 1 tablet (81 mg total) by mouth daily. (Patient taking differently: Take 325 mg by mouth daily.)   cilostazol (PLETAL) 50 MG  tablet Take by mouth 2 (two) times daily.    clopidogrel (PLAVIX) 75 MG tablet Take 1 tablet by mouth daily.   indomethacin (INDOCIN) 25 MG capsule Take 1 capsule (25 mg total) by mouth 2 (two) times daily as needed.   isosorbide mononitrate (IMDUR) 120 MG 24 hr tablet Take 120 mg by mouth daily.   lisinopril (PRINIVIL,ZESTRIL) 10 MG tablet Take 1 tablet by mouth daily.   metoprolol tartrate (LOPRESSOR) 50 MG tablet Take 50-75 mg by mouth 2 (two) times a day. Take 50MG by mouth every morning and 75MG every evening   nitroGLYCERIN (NITROSTAT) 0.4 MG SL tablet Place 1 tablet under the tongue as needed.   simvastatin (ZOCOR) 40 MG tablet Take 1 tablet (40 mg total) by mouth at bedtime.       03/21/2022    3:43 PM 01/11/2022   10:19 AM 10/18/2021   11:18 AM 10/06/2021    1:55 PM  GAD 7 : Generalized  Anxiety Score  Nervous, Anxious, on Edge 0 0 0 0  Control/stop worrying 0 0 0 0  Worry too much - different things 0 0 0 0  Trouble relaxing 0 0 0 0  Restless 0 0 0 0  Easily annoyed or irritable 0 0 0 0  Afraid - awful might happen 0 0 0 0  Total GAD 7 Score 0 0 0 0  Anxiety Difficulty Not difficult at all Not difficult at all Not difficult at all Not difficult at all       03/21/2022    3:43 PM 01/11/2022   10:19 AM 10/18/2021   11:18 AM  Depression screen PHQ 2/9  Decreased Interest 0 0 0  Down, Depressed, Hopeless 0 0 0  PHQ - 2 Score 0 0 0  Altered sleeping 0 0 0  Tired, decreased energy 0 0 0  Change in appetite 0 0 0  Feeling bad or failure about yourself  0 0 0  Trouble concentrating 0 0 0  Moving slowly or fidgety/restless 0 0 0  Suicidal thoughts 0 0 0  PHQ-9 Score 0 0 0  Difficult doing work/chores Not difficult at all Not difficult at all Not difficult at all    BP Readings from Last 3 Encounters:  03/21/22 92/62  03/20/22 (!) 103/58  02/22/22 (!) 136/59    Physical Exam Vitals and nursing note reviewed.  Constitutional:      General: He is not in acute distress.     Appearance: He is well-developed.  HENT:     Head: Normocephalic and atraumatic.  Cardiovascular:     Rate and Rhythm: Normal rate and regular rhythm.  Pulmonary:     Effort: Pulmonary effort is normal. No respiratory distress.     Breath sounds: No wheezing or rhonchi.  Musculoskeletal:     Cervical back: Normal range of motion.     Right lower leg: No edema.     Left lower leg: No edema.  Skin:    General: Skin is warm and dry.     Findings: No rash.  Neurological:     Mental Status: He is alert and oriented to person, place, and time.  Psychiatric:        Mood and Affect: Mood normal.        Behavior: Behavior normal.     Wt Readings from Last 3 Encounters:  03/21/22 194 lb (88 kg)  03/20/22 194 lb (88 kg)  02/22/22 195 lb (88.5 kg)    BP 92/62 (BP Location: Left Arm)   Pulse 78   Ht _0  (1.854 m)   Wt 194 lb (88 kg)   SpO2 95%   BMI 25.60 kg/m   Assessment and Plan: 1. Atherosclerosis of native artery of right lower extremity with intermittent claudication ([place]) Upcoming bypass surgery planned. Jury duty excuse letter provided.  2. Coronary artery disease involving native coronary artery of native heart with angina pectoris ([facility]) Will refill ISDN for 30 days until he can get a mail order Rx from cardiology - isosorbide mononitrate (IMDUR) 120 MG 24 hr tablet; Take 1 tablet (120 mg total) by mouth daily.  Dispense: 30 tablet; Refill: 0  3. Need for immunization against influenza - Flu Vaccine QUAD High Dose(Fluad)   Partially dictated using Editor, commissioning. Any errors are unintentional.  Halina Maidens, MD [facility]  03/21/2022

## 2022-03-21 NOTE — Addendum Note (Signed)
Addended by: Glean Hess on: 03/21/2022 04:47 PM   Modules accepted: Orders

## 2022-03-21 NOTE — Addendum Note (Signed)
Addended by: Clista Bernhardt on: 03/21/2022 04:12 PM   Modules accepted: Orders

## 2022-03-30 ENCOUNTER — Telehealth (INDEPENDENT_AMBULATORY_CARE_PROVIDER_SITE_OTHER): Payer: Self-pay

## 2022-03-30 NOTE — Telephone Encounter (Signed)
Spoke with the patient and he is scheduled with Dr. Lucky Cowboy on 04/11/22 at [facility] for a left femoral endarterectomy and left leg angio with SFA and Tibial stents. Pre-op phone call is on 04/04/22 between 8-1 pm. Pre-surgical instructions were discussed and will be mailed.

## 2022-04-04 ENCOUNTER — Encounter
Admission: RE | Admit: 2022-04-04 | Discharge: 2022-04-04 | Disposition: A | Discharge status: Home / Self Care | Payer: PPO | Source: Ambulatory Visit | Attending: Vascular Surgery | Admitting: Vascular Surgery

## 2022-04-04 ENCOUNTER — Other Ambulatory Visit (INDEPENDENT_AMBULATORY_CARE_PROVIDER_SITE_OTHER): Payer: Self-pay | Admitting: Nurse Practitioner

## 2022-04-04 DIAGNOSIS — I70211 Atherosclerosis of native arteries of extremities with intermittent claudication, right leg: Secondary | ICD-10-CM

## 2022-04-04 NOTE — Patient Instructions (Addendum)
Your procedure is scheduled on: Wednesday April 11, 2022. Report to Day Surgery inside [facility] 2nd floor, stop by registration desk before getting on elevator. To find out your arrival time please call [phone] between 1PM - 3PM on Tuesday April 10, 2022.  Remember: Instructions that are not followed completely may result in serious medical risk,  up to and including death, or upon the discretion of your surgeon and anesthesiologist your  surgery may need to be rescheduled.     _X__ 1. Do not eat food or drink fluids after midnight the night before your procedure.                 No chewing gum or hard candies.   __X__2.  On the morning of surgery brush your teeth with toothpaste and water, you                may rinse your mouth with mouthwash if you wish.  Do not swallow any toothpaste or mouthwash.     _X__ 3.  No Alcohol for 24 hours before or after surgery.   _X__ 4.  Do Not Smoke or use e-cigarettes For 24 Hours Prior to Your Surgery.                 Do not use any chewable tobacco products for at least 6 hours prior to                 Surgery.  _X__  5.  Do not use any recreational drugs (marijuana, cocaine, heroin, ecstasy, MDMA or other)                For at least one week prior to your surgery.  Combination of these drugs with anesthesia                May have life threatening results.  ____  6.  Bring all medications with you on the day of surgery if instructed.   __X__  7.  Notify your doctor if there is any change in your medical condition      (cold, fever, infections).     Do not wear jewelry, make-up, hairpins, clips or nail polish. Do not wear lotions, powders, or perfumes. You may wear deodorant. Do not shave 48 hours prior to surgery. Men may shave face and neck. Do not bring valuables to the hospital.    [facility] is not responsible for any belongings or valuables.  Contacts, dentures or bridgework may not be worn into  surgery. Leave your suitcase in the car. After surgery it may be brought to your room. For patients admitted to the hospital, discharge time is determined by your treatment team.   Patients discharged the day of surgery will not be allowed to drive home.   Make arrangements for someone to be with you for the first 24 hours of your Same Day Discharge.  __X__ Take these medicines the morning of surgery with A SIP OF WATER:    1. isosorbide mononitrate (IMDUR) 120 MG  2. metoprolol tartrate (LOPRESSOR) 50 MG   3.   4.  5.  6.  ____ Fleet Enema (as directed)   __X__ Use CHG Soap (or wipes) as directed  ____ Use Benzoyl Peroxide Gel as instructed  __X__ Use inhalers on the day of surgery  albuterol (VENTOLIN HFA) 108 (90 Base) MCG/ACT inhaler  ____ Stop metformin 2 days prior to surgery    ____ Take 1/2 of usual  insulin dose the night before surgery. No insulin the morning          of surgery.   __X__ Stop cilostazol (PLETAL) 50 MG  and clopidogrel (PLAVIX) 75 MG 7 days before your surgery as instruction by your doctor.    __X__ One Week prior to surgery- Stop Anti-inflammatories such as Ibuprofen, Aleve, Advil, Motrin, meloxicam (MOBIC), diclofenac, etodolac, ketorolac, Toradol, Daypro, piroxicam, Goody's or BC powders. OK TO USE TYLENOL IF NEEDED   __X__ Stop any vitamins and or supplements until after surgery.    ____ Bring C-Pap to the hospital.    If you have any questions regarding your pre-procedure instructions,  Please call Pre-admit Testing at [phone]

## 2022-04-06 ENCOUNTER — Ambulatory Visit (INDEPENDENT_AMBULATORY_CARE_PROVIDER_SITE_OTHER): Payer: PPO | Admitting: Internal Medicine

## 2022-04-06 ENCOUNTER — Encounter: Payer: Self-pay | Admitting: Internal Medicine

## 2022-04-06 VITALS — BP 128/74 | HR 62 | Ht 73.0 in | Wt 198.0 lb

## 2022-04-06 DIAGNOSIS — M25552 Pain in left hip: Secondary | ICD-10-CM | POA: Diagnosis not present

## 2022-04-06 DIAGNOSIS — Z0000 Encounter for general adult medical examination without abnormal findings: Secondary | ICD-10-CM | POA: Diagnosis not present

## 2022-04-06 DIAGNOSIS — M25551 Pain in right hip: Secondary | ICD-10-CM

## 2022-04-06 DIAGNOSIS — Z125 Encounter for screening for malignant neoplasm of prostate: Secondary | ICD-10-CM | POA: Diagnosis not present

## 2022-04-06 DIAGNOSIS — E782 Mixed hyperlipidemia: Secondary | ICD-10-CM

## 2022-04-06 DIAGNOSIS — I25119 Atherosclerotic heart disease of native coronary artery with unspecified angina pectoris: Secondary | ICD-10-CM

## 2022-04-06 DIAGNOSIS — I10 Essential (primary) hypertension: Secondary | ICD-10-CM

## 2022-04-06 DIAGNOSIS — R7303 Prediabetes: Secondary | ICD-10-CM | POA: Diagnosis not present

## 2022-04-06 NOTE — Patient Instructions (Signed)
Tylenol or Acetaminophen - [phone] mg 2-3 times per day as needed for hip pain.

## 2022-04-06 NOTE — Progress Notes (Signed)
Date:  04/06/2022   Name:  Aaron [name]   DOB:  16-Sep-1955   MRN:  [id number]   Chief Complaint: Annual Exam Aaron [name] is a 66 y.o. male who presents today for his Complete Annual Exam. He feels well. He reports exercising/ walking dog three times daily. He reports he is sleeping fairly well.   Colonoscopy: 01/2020 repeat 5 yrs  Immunization History  Administered Date(s) Administered   Fluad Quad(high Dose 65+) 04/05/2021, 03/21/2022   Influenza,inj,Quad PF,6+ Mos 05/19/2015, 04/06/2016, 04/02/2017, 03/24/2018, 04/01/2019, 04/14/2020   PFIZER(Purple Top)SARS-COV-2 Vaccination 08/25/2019, 09/15/2019, 06/13/2020   PNEUMOCOCCAL CONJUGATE-20 04/05/2021   Pneumococcal Polysaccharide-23 11/14/2012, 07/03/2014   Tdap 12/18/2013   Health Maintenance Due  Topic Date Due   Zoster Vaccines- Shingrix (1 of 2) Never done    Lab Results  Component Value Date   PSA1 1.7 04/05/2021   PSA1 2.5 11/05/2019   PSA1 1.5 10/28/2018   PSA 0.98 05/19/2015     Hypertension This is a chronic problem. The problem is controlled. Pertinent negatives include no chest pain, headaches, palpitations or shortness of breath. Past treatments include ACE inhibitors, beta blockers and direct vasodilators. Hypertensive end-organ damage includes CAD/MI and PVD. There is no history of kidney disease or CVA.  Hyperlipidemia This is a chronic problem. The problem is controlled. Pertinent negatives include no chest pain, myalgias or shortness of breath. Current antihyperlipidemic treatment includes statins. The current treatment provides moderate improvement of lipids.  Hip Pain  There was no injury mechanism. The pain is present in the right hip and left hip. The quality of the pain is described as aching and cramping. The pain is moderate. Associated symptoms comments: Having trouble walking the dog - VS thinks it is OA rather than PVD.  PVD - he is having iliac bypass next week for his LLE.  He appears  optimized for surgery.  Labs will be done today to include CBC and CMP.   Lab Results  Component Value Date   NA 134 04/05/2021   K 4.5 04/05/2021   CO2 22 04/05/2021   GLUCOSE 102 (H) 04/05/2021   BUN 17 02/22/2022   CREATININE 0.83 02/22/2022   CALCIUM 9.8 04/05/2021   EGFR 96 04/05/2021   GFRNONAA >60 02/22/2022   Lab Results  Component Value Date   CHOL 140 04/05/2021   HDL 36 (L) 04/05/2021   LDLCALC 83 04/05/2021   TRIG 114 04/05/2021   CHOLHDL 3.9 04/05/2021   Lab Results  Component Value Date   TSH 3.214 02/06/2019   Lab Results  Component Value Date   HGBA1C 5.5 08/17/2021   Lab Results  Component Value Date   WBC 6.6 12/29/2020   HGB 16.6 12/29/2020   HCT 47.9 12/29/2020   MCV 96.8 12/29/2020   PLT 181 12/29/2020   Lab Results  Component Value Date   ALT 44 04/05/2021   AST 31 04/05/2021   ALKPHOS 77 04/05/2021   BILITOT 0.6 04/05/2021   No results found for: "25OHVITD2", "25OHVITD3", "VD25OH"   Review of Systems  Constitutional:  Negative for appetite change, chills, diaphoresis, fatigue and unexpected weight change.  HENT:  Negative for hearing loss, tinnitus, trouble swallowing and voice change.   Eyes:  Negative for visual disturbance.  Respiratory:  Negative for choking, shortness of breath and wheezing.   Cardiovascular:  Negative for chest pain, palpitations and leg swelling.  Gastrointestinal:  Negative for abdominal pain, blood in stool, constipation and diarrhea.  Genitourinary:  Negative  for difficulty urinating, dysuria and frequency.  Musculoskeletal:  Positive for arthralgias and gait problem. Negative for back pain and myalgias.  Skin:  Negative for color change and rash.  Neurological:  Negative for dizziness, syncope and headaches.  Hematological:  Negative for adenopathy.  Psychiatric/Behavioral:  Negative for dysphoric mood and sleep disturbance. The patient is not nervous/anxious.     Patient Active Problem List   Diagnosis  Date Noted   Hepatic steatosis 01/04/2022   Aortic atherosclerosis ([place]) 07/10/2020   Stage 2 moderate COPD by GOLD classification ([place]) 05/16/2020   Dyspepsia and disorder of function of stomach 05/16/2020   Cervical disc disorder of mid-cervical region 05/10/2020   Polyp of descending colon    Mixed hyperlipidemia 10/28/2018   Atherosclerosis of native arteries of extremity with intermittent claudication ([place]) 07/13/2016   Tobacco use disorder 07/13/2016   Prediabetes 07/27/2015   Controlled gout 12/22/2014   Coronary artery disease involving native coronary artery of native heart with angina pectoris ([place]) 10/09/2014   Essential (primary) hypertension 10/09/2014   Dupuytren's disease of palm 10/09/2014   Psoriasis 10/09/2014   H/O cardiac catheterization 01/27/2014   Peripheral vascular disease ([place]) 01/27/2014    Allergies  Allergen Reactions   Contrast Media [Iodinated Contrast Media] Hives   Iodine Hives    Past Surgical History:  Procedure Laterality Date   APPENDECTOMY     CARDIAC CATHETERIZATION     cardiac stents   2012   CATARACT EXTRACTION W/PHACO Right 10/26/2021   Procedure: CATARACT EXTRACTION PHACO AND INTRAOCULAR LENS PLACEMENT ([place]) RIGHT OMIDRIA  10.98 01:18.2;  Surgeon: [name], MD;  Location: [place];  Service: Ophthalmology;  Laterality: Right;   CATARACT EXTRACTION W/PHACO Left 11/16/2021   Procedure: CATARACT EXTRACTION PHACO AND INTRAOCULAR LENS PLACEMENT ([place]) LEFT;  Surgeon: [name], MD;  Location: [place];  Service: Ophthalmology;  Laterality: Left;  6.91 0:46.0   COLONOSCOPY  03/30/2013   [place]   COLONOSCOPY WITH PROPOFOL N/A 12/10/2019   Procedure: COLONOSCOPY WITH PROPOFOL;  Surgeon: [name], MD;  Location: [place];  Service: Endoscopy;  Laterality: N/A;   COLONOSCOPY WITH PROPOFOL N/A 01/18/2020   Procedure: COLONOSCOPY WITH PROPOFOL;  Surgeon: [name], MD;   Location: [place];  Service: Endoscopy;  Laterality: N/A;  4   EYE SURGERY     LEFT HEART CATH AND CORONARY ANGIOGRAPHY N/A 12/29/2020   Procedure: LEFT HEART CATH AND CORONARY ANGIOGRAPHY;  Surgeon: Corey Skains, MD;  Location: [place];  Service: Cardiovascular;  Laterality: N/A;   LOWER EXTREMITY ANGIOGRAPHY Right 09/25/2018   Procedure: LOWER EXTREMITY ANGIOGRAPHY;  Surgeon: Algernon Huxley, MD;  Location: [facility];  Service: Cardiovascular;  Laterality: Right;   LOWER EXTREMITY ANGIOGRAPHY Left 03/17/2020   Procedure: LOWER EXTREMITY ANGIOGRAPHY;  Surgeon: Algernon Huxley, MD;  Location: [place];  Service: Cardiovascular;  Laterality: Left;   LOWER EXTREMITY ANGIOGRAPHY Right 02/22/2022   Procedure: Lower Extremity Angiography;  Surgeon: Algernon Huxley, MD;  Location: [place] [facility];  Service: Cardiovascular;  Laterality: Right;   POLYPECTOMY N/A 01/18/2020   Procedure: POLYPECTOMY;  Surgeon: [name], MD;  Location: [place];  Service: Endoscopy;  Laterality: N/A;    Social History   Tobacco Use   Smoking status: Every Day    Packs/day: 0.50    Years: 46.00    Total pack years: 23.00    Types: Cigarettes   Smokeless tobacco: Never  Tobacco comments:    Pt down to 1/2 PPD 05/24/21  Vaping Use   Vaping Use: Never used  Substance Use Topics   Alcohol use: Yes    Comment: quit drinking around 04/2019(May have drink 1x/mo)   Drug use: No     Medication list has been reviewed and updated.  Current Meds  Medication Sig   albuterol (VENTOLIN HFA) 108 (90 Base) MCG/ACT inhaler Inhale 2 puffs into the lungs every 6 (six) hours as needed for wheezing or shortness of breath.   aspirin 325 MG tablet Take 325 mg by mouth daily.   cilostazol (PLETAL) 50 MG tablet Take by mouth 2 (two) times daily.    clopidogrel (PLAVIX) 75 MG tablet Take 1 tablet by mouth daily.   indomethacin (INDOCIN) 25 MG capsule Take 1 capsule  (25 mg total) by mouth 2 (two) times daily as needed.   isosorbide mononitrate (IMDUR) 120 MG 24 hr tablet Take 1 tablet (120 mg total) by mouth daily.   lisinopril (PRINIVIL,ZESTRIL) 10 MG tablet Take 1 tablet by mouth daily.   metoprolol tartrate (LOPRESSOR) 50 MG tablet Take 50-75 mg by mouth 2 (two) times a day. Take $RemoveB'50MG'shHdvozC$  by mouth every morning and $RemoveBef'75MG'bKbhnKCgDD$  every evening   nitroGLYCERIN (NITROSTAT) 0.4 MG SL tablet Place 1 tablet under the tongue as needed.   simvastatin (ZOCOR) 40 MG tablet Take 1 tablet (40 mg total) by mouth at bedtime.       04/06/2022   10:03 AM 03/21/2022    3:43 PM 01/11/2022   10:19 AM 10/18/2021   11:18 AM  GAD 7 : Generalized Anxiety Score  Nervous, Anxious, on Edge 0 0 0 0  Control/stop worrying 0 0 0 0  Worry too much - different things 0 0 0 0  Trouble relaxing 0 0 0 0  Restless 0 0 0 0  Easily annoyed or irritable 0 0 0 0  Afraid - awful might happen 0 0 0 0  Total GAD 7 Score 0 0 0 0  Anxiety Difficulty Not difficult at all Not difficult at all Not difficult at all Not difficult at all       04/06/2022   10:03 AM 03/21/2022    3:43 PM 01/11/2022   10:19 AM  Depression screen PHQ 2/9  Decreased Interest 0 0 0  Down, Depressed, Hopeless 0 0 0  PHQ - 2 Score 0 0 0  Altered sleeping 0 0 0  Tired, decreased energy 0 0 0  Change in appetite 0 0 0  Feeling bad or failure about yourself  0 0 0  Trouble concentrating 0 0 0  Moving slowly or fidgety/restless 0 0 0  Suicidal thoughts 0 0 0  PHQ-9 Score 0 0 0  Difficult doing work/chores Not difficult at all Not difficult at all Not difficult at all    BP Readings from Last 3 Encounters:  04/06/22 128/74  03/21/22 92/62  03/20/22 (!) 103/58    Physical Exam Vitals and nursing note reviewed.  Constitutional:      Appearance: Normal appearance. He is well-developed.  HENT:     Head: Normocephalic.     Right Ear: Tympanic membrane, ear canal and external ear normal.     Left Ear: Tympanic membrane,  ear canal and external ear normal.     Nose: Nose normal.  Eyes:     Conjunctiva/sclera: Conjunctivae normal.     Pupils: Pupils are equal, round, and reactive to light.  Neck:  Thyroid: No thyromegaly.     Vascular: No carotid bruit.  Cardiovascular:     Rate and Rhythm: Normal rate and regular rhythm.     Pulses:          Dorsalis pedis pulses are 2+ on the right side and 0 on the left side.       Posterior tibial pulses are 2+ on the right side and 0 on the left side.     Heart sounds: Normal heart sounds.  Pulmonary:     Effort: Pulmonary effort is normal.     Breath sounds: Normal breath sounds. No wheezing.  Chest:  Breasts:    Right: No mass.     Left: No mass.  Abdominal:     General: Bowel sounds are normal.     Palpations: Abdomen is soft.     Tenderness: There is no abdominal tenderness.  Musculoskeletal:        General: Normal range of motion.     Cervical back: Normal range of motion and neck supple.  Lymphadenopathy:     Cervical: No cervical adenopathy.  Skin:    General: Skin is warm and dry.  Neurological:     Mental Status: He is alert and oriented to person, place, and time.     Deep Tendon Reflexes: Reflexes are normal and symmetric.  Psychiatric:        Attention and Perception: Attention normal.        Mood and Affect: Mood normal.        Thought Content: Thought content normal.     Wt Readings from Last 3 Encounters:  04/06/22 198 lb (89.8 kg)  04/04/22 194 lb (88 kg)  03/21/22 194 lb (88 kg)    BP 128/74   Pulse 62   Ht $R'6\' 1"'Oy$  (1.854 m)   Wt 198 lb (89.8 kg)   SpO2 96%   BMI 26.12 kg/m   Assessment and Plan: 1. Annual physical exam Normal exam except for PVD Up to date on screenings and immunizations.  2. Prostate cancer screening DRE deferred - PSA  3. Essential (primary) hypertension Clinically stable exam with well controlled BP. Tolerating medications without side effects at this time. Pt to continue current regimen and  low sodium diet; benefits of regular exercise as able discussed. - CBC with Differential/Platelet - Comprehensive metabolic panel - Urinalysis, Routine w reflex microscopic  4. Mixed hyperlipidemia - Lipid panel  5. Prediabetes - Hemoglobin A1c  6. Coronary artery disease involving native coronary artery of native heart with angina pectoris ([place]) He is stable without chest pain or shortness of breath Exercise limited by hip pain  7. Bilateral hip pain Will reassess after vascular procedure - if pain is not relieved, he will return for further evaluation. Recommend Tylenol [phone] mg tid as needed.   Partially dictated using Editor, commissioning. Any errors are unintentional.  Halina Maidens, MD [facility]  04/06/2022

## 2022-04-07 LAB — CBC WITH DIFFERENTIAL/PLATELET
Basophils Absolute: 0.1 10*3/uL (ref 0.0–0.2)
Basos: 1 %
EOS (ABSOLUTE): 0.2 10*3/uL (ref 0.0–0.4)
Eos: 2 %
Hematocrit: 47.4 % (ref 37.5–51.0)
Hemoglobin: 16.3 g/dL (ref 13.0–17.7)
Immature Grans (Abs): 0 10*3/uL (ref 0.0–0.1)
Immature Granulocytes: 0 %
Lymphocytes Absolute: 3.2 10*3/uL — ABNORMAL HIGH (ref 0.7–3.1)
Lymphs: 31 %
MCH: 32.7 pg (ref 26.6–33.0)
MCHC: 34.4 g/dL (ref 31.5–35.7)
MCV: 95 fL (ref 79–97)
Monocytes Absolute: 0.8 10*3/uL (ref 0.1–0.9)
Monocytes: 8 %
Neutrophils Absolute: 6 10*3/uL (ref 1.4–7.0)
Neutrophils: 58 %
Platelets: 287 10*3/uL (ref 150–450)
RBC: 4.98 x10E6/uL (ref 4.14–5.80)
RDW: 12.7 % (ref 11.6–15.4)
WBC: 10.3 10*3/uL (ref 3.4–10.8)

## 2022-04-07 LAB — COMPREHENSIVE METABOLIC PANEL
ALT: 28 IU/L (ref 0–44)
AST: 22 IU/L (ref 0–40)
Albumin/Globulin Ratio: 1.7 (ref 1.2–2.2)
Albumin: 4.5 g/dL (ref 3.9–4.9)
Alkaline Phosphatase: 84 IU/L (ref 44–121)
BUN/Creatinine Ratio: 20 (ref 10–24)
BUN: 19 mg/dL (ref 8–27)
Bilirubin Total: 0.4 mg/dL (ref 0.0–1.2)
CO2: 26 mmol/L (ref 20–29)
Calcium: 9.9 mg/dL (ref 8.6–10.2)
Chloride: 97 mmol/L (ref 96–106)
Creatinine, Ser: 0.93 mg/dL (ref 0.76–1.27)
Globulin, Total: 2.6 g/dL (ref 1.5–4.5)
Glucose: 92 mg/dL (ref 70–99)
Potassium: 5 mmol/L (ref 3.5–5.2)
Sodium: 137 mmol/L (ref 134–144)
Total Protein: 7.1 g/dL (ref 6.0–8.5)
eGFR: 91 mL/min/{1.73_m2} (ref >59)

## 2022-04-07 LAB — LIPID PANEL
Chol/HDL Ratio: 3.7 ratio (ref 0.0–5.0)
Cholesterol, Total: 144 mg/dL (ref 100–199)
HDL: 39 mg/dL — ABNORMAL LOW (ref >39)
LDL Chol Calc (NIH): 78 mg/dL (ref 0–99)
Triglycerides: 155 mg/dL — ABNORMAL HIGH (ref 0–149)
VLDL Cholesterol Cal: 27 mg/dL (ref 5–40)

## 2022-04-07 LAB — URINALYSIS, ROUTINE W REFLEX MICROSCOPIC
Bilirubin, UA: NEGATIVE
Glucose, UA: NEGATIVE
Ketones, UA: NEGATIVE
Leukocytes,UA: NEGATIVE
Nitrite, UA: NEGATIVE
Protein,UA: NEGATIVE
RBC, UA: NEGATIVE
Specific Gravity, UA: 1.011 (ref 1.005–1.030)
Urobilinogen, Ur: 0.2 mg/dL (ref 0.2–1.0)
pH, UA: 5.5 (ref 5.0–7.5)

## 2022-04-07 LAB — HEMOGLOBIN A1C
Est. average glucose Bld gHb Est-mCnc: 126 mg/dL
Hgb A1c MFr Bld: 6 % — ABNORMAL HIGH (ref 4.8–5.6)

## 2022-04-07 LAB — PSA: Prostate Specific Ag, Serum: 2.6 ng/mL (ref 0.0–4.0)

## 2022-04-09 ENCOUNTER — Encounter: Payer: Self-pay | Admitting: Vascular Surgery

## 2022-04-09 NOTE — Progress Notes (Signed)
Perioperative Services  Pre-Admission/Anesthesia Testing Clinical Review  Date: 04/09/22  Patient Demographics:  Name: Aaron [name] DOB:   1955-11-29 MRN:   [id number]  Planned Surgical Procedure(s):    Case: [id number] Date/Time: 04/11/22 0815   Procedures:      ENDARTERECTOMY FEMORAL ( SFA & TIBIAL) (Left)     LOWER EXTREMITY ANGIOGRAM (Left)     APPLICATION OF CELL SAVER   Anesthesia type: General   Pre-op diagnosis: ASO WITH CLAUDICATION   Location: [facility] [facility]   Surgeons: Algernon Huxley, MD   NOTE: Available PAT nursing documentation and vital signs have been reviewed. Clinical nursing staff has updated patient's PMH/PSHx, current medication list, and drug allergies/intolerances to ensure comprehensive history available to assist in medical decision making as it pertains to the aforementioned surgical procedure and anticipated anesthetic course. Extensive review of available clinical information performed. [place] [facility] and PSHx updated with any diagnoses/procedures that  may have been inadvertently omitted during his intake with the pre-admission testing department's nursing staff.  Clinical Discussion:  Aaron [name] is a 66 y.o. male who is submitted for pre-surgical anesthesia review and clearance prior to him undergoing the above procedure. Patient is a Current Smoker (23 pack years). Pertinent PMH includes: CAD, diastolic dysfunction, unstable angina, PAD, aortic atherosclerosis, syncope, HTN, HLD, COPD, GERD (no daily Tx), glaucoma.  Patient is followed by cardiology Saralyn Pilar, MD). He was last seen in the cardiology clinic on 02/13/2022; notes reviewed.  At the time of his clinic visit, patient denied any episodes of chest pain, however he had complaints of chronic exertional dyspnea.  He denied any PND, orthopnea, palpitations, significant peripheral edema, vertiginous symptoms, or presyncope/syncope.  Patient with lower extremity  claudication in the setting of known PVD.  Patient with a past medical history significant for cardiovascular diagnoses.  Of note, records regarding patient's full cardiovascular history not available for review at time of consult.  Information gathered from patient report and local cardiologist notes.   Patient has undergone multiple diagnostic LEFT heart catheterizations in the past.  Heart catheterization performed on an unknown date resulted in placement of stents (unknown type) to the proximal LAD and proximal RCA.  Heart catheterization performed on 06/19/2010 revealed multivessel CAD; 30% mid LM, 60% mid LAD, 60% proximal LCx, 50% proximal ramus intermedius, 50% distal RCA, 50% RPDA, and 50% RPLS.  There was 40% in-stent restenosis of a previously placed stent to the proximal RCA.  Further intervention was deferred opting for medical management.  Left heart catheterization was performed on 07/08/2014 revealing multivessel CAD; 10% proximal LM, 30% distal LAD, 75% ostial LCx, 30% mid RCA, and 40% RPDA.  Again. further intervention was deferred opting for medical management.  Most recent cardiac catheterization was performed on 12/28/2021 revealing multivessel CAD; 50% mid RCA, 65% RPDA, 45% mid to distal RCA, 60% ostial to proximal RCA, 95% ostial proximal LCx, 60% ramus intermedius, 30% proximal LAD, and 30% mid LM.  No intervention was performed.  Patient to pursue aggressive risk factor modification and aggressive management of his known coronary artery disease.  Myocardial perfusion imaging study performed on 02/18/2019 revealed a normal left ventricular systolic function with EF 53%.  There was normal myocardial thickening and wall motion.  No artifact is noted.  Left ventricular cavity size normal.  There was no evidence of stress-induced myocardial ischemia or arrhythmia; no scintigraphic evidence of scar. Study determined to be normal and low risk.  Patient with  a known history of  significant PVD/PAD.  He has undergone multiple PTA procedures to his BILATERAL lower extremities in the past.  Patient with stents in place.  Blood pressure well controlled at 110/63 on currently prescribed nitrate (isosorbide mononitrate), ACEi (lisinopril) and beta-blocker (metoprolol tartrate) therapies.  Patient is on simvastatin for his HLD diagnosis and further ASCVD prevention.  In addition to the aforementioned nitrate therapy, patient has a supply of short acting nitrates (NTG) to use on a as needed basis; denied recent use.  Given his extensive PVD/PAD, patient is on multiagent antiplatelet therapy using full dose ASA, cilostazol, and clopidogrel.  Patient is reported to be compliant with therapy with no evidence or reports of GI bleeding.  Patient is not diabetic. Patient does not have an OSAH diagnosis.  Functional capacity limited by lower extremity claudication pain, however patient still felt to be able to achieve at least 4 METS of physical activity without experiencing any angina/anginal equivalent symptoms.  No changes were made to his medication regimen.  Patient to follow-up with outpatient cardiology in 4 months or sooner if needed.  Aaron [name] is scheduled for an ENDARTERECTOMY FEMORAL (SFA & TIBIAL); LOWER EXTREMITY ANGIOGRAM on 04/11/2022 with Dr. [name], MD.  Given patient's past medical history significant for cardiovascular diagnoses, presurgical cardiac clearance was sought by the PAT team. Per cardiology, "this patient is optimized for surgery and may proceed with the planned procedural course with a LOW risk of significant perioperative cardiovascular complications".  Again, this patient is on daily antiplatelet therapy.  He has been instructed on recommendations from his cardiologist and vascular surgeon for holding his cilostazol, clopidogrel, and full dose ASA for 7 days prior to his procedure with plans to restart as soon as postoperatively respectively minimized by  his primary attending surgeon.  Patient is aware that his last doses of these medications should be on 04/03/2022.  Patient denies previous perioperative complications with anesthesia in the past. In review of the available records, it is noted that patient underwent a MAC anesthetic course at [facility] ([facility]) in 11/2021 without documented complications.      04/06/2022    9:51 AM 04/04/2022   10:40 AM 03/21/2022    3:41 PM  Vitals with BMI  Height _0  _1    Weight 198 lbs 194 lbs   BMI 28.76 81.1   Systolic 572  92  Diastolic 74  62  Pulse 62      Providers/Specialists:   NOTE: Primary physician provider listed below. Patient may have been seen by APP or partner within same practice.   PROVIDER ROLE / SPECIALTY LAST Imelda Pillow, MD Vascular Surgery (Surgeon) 03/20/2022  Glean Hess, MD Primary Care Provider 04/06/2022  Isaias Cowman, MD Cardiology 02/13/2022  Ottie Glazier, MD Pulmonary Medicine 02/19/2022   Allergies:  Contrast media [iodinated contrast media] and Iodine  Current Home Medications:   No current facility-administered medications for this encounter.    aspirin 325 MG tablet   albuterol (VENTOLIN HFA) 108 (90 Base) MCG/ACT inhaler   cilostazol (PLETAL) 50 MG tablet   clopidogrel (PLAVIX) 75 MG tablet   indomethacin (INDOCIN) 25 MG capsule   isosorbide mononitrate (IMDUR) 120 MG 24 hr tablet   lisinopril (PRINIVIL,ZESTRIL) 10 MG tablet   metoprolol tartrate (LOPRESSOR) 50 MG tablet   nitroGLYCERIN (NITROSTAT) 0.4 MG SL tablet   simvastatin (ZOCOR) 40 MG tablet   History:   Past Medical History:  Diagnosis Date  Aortic atherosclerosis (HCC)    COPD (chronic obstructive pulmonary disease) (HCC)    Coronary artery disease 06/19/2010   a.) LHC/PCI (unknown date) --> stent to pRCA and pLAD (unknown type); b.) LHC 06/19/2010: 30 mLM, 60 mLAD, 60 pLCx, 50 pRI, 40 ISR pRCA, 50 dRCA, 50 RPDA, 50 RPLS --> med mgmt; c.) LHC  07/08/2014: 10 pLM, 30 dLAD, 75 oLCx, 30 mRCA, 40 RPDA, patent RCA stent --> med mgmt; d.) LHC 12/28/2021: 50 mRCA, 65 RPDA, 45 m-dRCA, 60 o-pRCA, 95 o-pLCx, 60 RI, 30 pLAD, 30 mLM --> med mgmt.   Diastolic dysfunction 24/46/2863   a.) TTE 09/16/2017: ED 55-60%, triv AR/MR, G1DD   Fracture of left ankle, closed, initial encounter 10/23/2017   GERD (gastroesophageal reflux disease)    Glaucoma    Gout    Hepatic steatosis    Hypercholesteremia    Hypertension    Long term current use of antithrombotics/antiplatelets    a.) cilostazol + full dose ASA + clopidogrel   PAD (peripheral artery disease) ([place])    a.) s/p multiple PTA procedures to lower extremities   Pain in limb 07/13/2016   Syncope 09/16/2017   Unstable angina ([place]) 01/12/2017   Vertigo    Past Surgical History:  Procedure Laterality Date   APPENDECTOMY     CATARACT EXTRACTION W/PHACO Right 10/26/2021   Procedure: CATARACT EXTRACTION PHACO AND INTRAOCULAR LENS PLACEMENT ([place]) RIGHT OMIDRIA  10.98 01:18.2;  Surgeon: [name], MD;  Location: [place];  Service: Ophthalmology;  Laterality: Right;   CATARACT EXTRACTION W/PHACO Left 11/16/2021   Procedure: CATARACT EXTRACTION PHACO AND INTRAOCULAR LENS PLACEMENT ([place]) LEFT;  Surgeon: [name], MD;  Location: [place];  Service: Ophthalmology;  Laterality: Left;  6.91 0:46.0   COLONOSCOPY  03/30/2013   [place]   COLONOSCOPY WITH PROPOFOL N/A 12/10/2019   Procedure: COLONOSCOPY WITH PROPOFOL;  Surgeon: [name], MD;  Location: [place];  Service: Endoscopy;  Laterality: N/A;   COLONOSCOPY WITH PROPOFOL N/A 01/18/2020   Procedure: COLONOSCOPY WITH PROPOFOL;  Surgeon: [name], MD;  Location: [place];  Service: Endoscopy;  Laterality: N/A;  4   CORONARY ANGIOPLASTY WITH STENT PLACEMENT Left    Procedure: CORONARY ANGIOPLASTY WITH STENT PLACEMENT   LEFT HEART CATH AND CORONARY ANGIOGRAPHY N/A  12/29/2020   Procedure: LEFT HEART CATH AND CORONARY ANGIOGRAPHY;  Surgeon: Corey Skains, MD;  Location: [place] [facility];  Service: Cardiovascular;  Laterality: N/A;   LEFT HEART CATH AND CORONARY ANGIOGRAPHY Left 06/19/2010   Procedure: LEFT HEART CATH AND CORONARY ANGIOGRAPHY; Location: [place]; Surgeon: Lowanda Foster, MD   LEFT HEART CATH AND CORONARY ANGIOGRAPHY Left 07/08/2014   Procedure: LEFT HEART CATH AND CORONARY ANGIOGRAPHY; Location: [place]; Surgeon: Isaias Cowman, MD   LOWER EXTREMITY ANGIOGRAPHY Right 09/25/2018   Procedure: LOWER EXTREMITY ANGIOGRAPHY;  Surgeon: Algernon Huxley, MD;  Location: [place] [facility];  Service: Cardiovascular;  Laterality: Right;   LOWER EXTREMITY ANGIOGRAPHY Left 03/17/2020   Procedure: LOWER EXTREMITY ANGIOGRAPHY;  Surgeon: Algernon Huxley, MD;  Location: [place] [facility];  Service: Cardiovascular;  Laterality: Left;   LOWER EXTREMITY ANGIOGRAPHY Right 02/22/2022   Procedure: Lower Extremity Angiography;  Surgeon: Algernon Huxley, MD;  Location: [place] [facility];  Service: Cardiovascular;  Laterality: Right;   POLYPECTOMY N/A 01/18/2020   Procedure: POLYPECTOMY;  Surgeon: [name], MD;  Location: [place];  Service: Endoscopy;  Laterality: N/A;   Family History  Problem Relation Age  of Onset   Hypertension Mother    Diabetes Mother    Heart failure Father        age 86   Cancer Father    Ovarian cancer Sister    Heart disease Brother    Social History   Tobacco Use   Smoking status: Every Day    Packs/day: 0.50    Years: 46.00    Total pack years: 23.00    Types: Cigarettes   Smokeless tobacco: Never   Tobacco comments:    Pt down to 1/2 PPD 05/24/21  Vaping Use   Vaping Use: Never used  Substance Use Topics   Alcohol use: Yes    Comment: quit drinking around 04/2019(May have drink 1x/mo)   Drug use: No    Pertinent Clinical Results:  LABS: Labs reviewed: Acceptable for surgery.  No visits with  results within 3 Day(s) from this visit.  Latest known visit with results is:  Office Visit on 04/06/2022  Component Date Value Ref Range Status   WBC 04/06/2022 10.3  3.4 - 10.8 x10E3/uL Final   RBC 04/06/2022 4.98  4.14 - 5.80 x10E6/uL Final   Hemoglobin 04/06/2022 16.3  13.0 - 17.7 g/dL Final   Hematocrit 04/06/2022 47.4  37.5 - 51.0 % Final   MCV 04/06/2022 95  79 - 97 fL Final   MCH 04/06/2022 32.7  26.6 - 33.0 pg Final   MCHC 04/06/2022 34.4  31.5 - 35.7 g/dL Final   RDW 04/06/2022 12.7  11.6 - 15.4 % Final   Platelets 04/06/2022 287  150 - 450 x10E3/uL Final   Neutrophils 04/06/2022 58  Not Estab. % Final   Lymphs 04/06/2022 31  Not Estab. % Final   Monocytes 04/06/2022 8  Not Estab. % Final   Eos 04/06/2022 2  Not Estab. % Final   Basos 04/06/2022 1  Not Estab. % Final   Neutrophils Absolute 04/06/2022 6.0  1.4 - 7.0 x10E3/uL Final   Lymphocytes Absolute 04/06/2022 3.2 (H)  0.7 - 3.1 x10E3/uL Final   Monocytes Absolute 04/06/2022 0.8  0.1 - 0.9 x10E3/uL Final   EOS (ABSOLUTE) 04/06/2022 0.2  0.0 - 0.4 x10E3/uL Final   Basophils Absolute 04/06/2022 0.1  0.0 - 0.2 x10E3/uL Final   Immature Granulocytes 04/06/2022 0  Not Estab. % Final   Immature Grans (Abs) 04/06/2022 0.0  0.0 - 0.1 x10E3/uL Final   Glucose 04/06/2022 92  70 - 99 mg/dL Final   BUN 04/06/2022 19  8 - 27 mg/dL Final   Creatinine, Ser 04/06/2022 0.93  0.76 - 1.27 mg/dL Final   eGFR 04/06/2022 91  >59 mL/min/1.73 Final   BUN/Creatinine Ratio 04/06/2022 20  10 - 24 Final   Sodium 04/06/2022 137  134 - 144 mmol/L Final   Potassium 04/06/2022 5.0  3.5 - 5.2 mmol/L Final   Chloride 04/06/2022 97  96 - 106 mmol/L Final   CO2 04/06/2022 26  20 - 29 mmol/L Final   Calcium 04/06/2022 9.9  8.6 - 10.2 mg/dL Final   Total Protein 04/06/2022 7.1  6.0 - 8.5 g/dL Final   Albumin 04/06/2022 4.5  3.9 - 4.9 g/dL Final   Globulin, Total 04/06/2022 2.6  1.5 - 4.5 g/dL Final   Albumin/Globulin Ratio 04/06/2022 1.7  1.2 - 2.2  Final   Bilirubin Total 04/06/2022 0.4  0.0 - 1.2 mg/dL Final   Alkaline Phosphatase 04/06/2022 84  44 - 121 IU/L Final   AST 04/06/2022 22  0 - 40 IU/L Final  ALT 04/06/2022 28  0 - 44 IU/L Final   Hgb A1c MFr Bld 04/06/2022 6.0 (H)  4.8 - 5.6 % Final   Comment:          Prediabetes: 5.7 - 6.4          Diabetes: >6.4          Glycemic control for adults with diabetes: <7.0    Est. average glucose Bld gHb Est-m* 04/06/2022 126  mg/dL Final   Cholesterol, Total 04/06/2022 144  100 - 199 mg/dL Final   Triglycerides 04/06/2022 155 (H)  0 - 149 mg/dL Final   HDL 04/06/2022 39 (L)  >39 mg/dL Final   VLDL Cholesterol Cal 04/06/2022 27  5 - 40 mg/dL Final   LDL Chol Calc (NIH) 04/06/2022 78  0 - 99 mg/dL Final   Chol/HDL Ratio 04/06/2022 3.7  0.0 - 5.0 ratio Final   Comment:                                   T. Chol/HDL Ratio                                             Men  Women                               1/2 Avg.Risk  3.4    3.3                                   Avg.Risk  5.0    4.4                                2X Avg.Risk  9.6    7.1                                3X Avg.Risk 23.4   11.0    Prostate Specific Ag, Serum 04/06/2022 2.6  0.0 - 4.0 ng/mL Final   Comment: Roche ECLIA methodology. According to the American Urological Association, Serum PSA should decrease and remain at undetectable levels after radical prostatectomy. The AUA defines biochemical recurrence as an initial PSA value 0.2 ng/mL or greater followed by a subsequent confirmatory PSA value 0.2 ng/mL or greater. Values obtained with different assay methods or kits cannot be used interchangeably. Results cannot be interpreted as absolute evidence of the presence or absence of malignant disease.    Specific Gravity, UA 04/06/2022 1.011  1.005 - 1.030 Final   pH, UA 04/06/2022 5.5  5.0 - 7.5 Final   Color, UA 04/06/2022 Yellow  Yellow Final   Appearance Ur 04/06/2022 Clear  Clear Final   Leukocytes,UA 04/06/2022  Negative  Negative Final   Protein,UA 04/06/2022 Negative  Negative/Trace Final   Glucose, UA 04/06/2022 Negative  Negative Final   Ketones, UA 04/06/2022 Negative  Negative Final   RBC, UA 04/06/2022 Negative  Negative Final   Bilirubin, UA 04/06/2022 Negative  Negative Final   Urobilinogen, Ur 04/06/2022 0.2  0.2 - 1.0 mg/dL Final   Nitrite, UA 04/06/2022 Negative  Negative Final   Microscopic Examination 04/06/2022 Comment   Final   Microscopic not indicated and not performed.    ECG: Date: 02/13/2022 Rate: 67 bpm Rhythm: normal sinus Axis (leads I and aVF): Normal Intervals: PR 178 ms. QRS 108 ms. QTc 429 ms. ST segment and T wave changes: No evidence of acute ST segment elevation or depression Comparison: Similar to previous tracing obtained on 05/03/2020 NOTE: Tracing obtained at [facility]; unable for review. Above based on cardiologist's interpretation.    IMAGING / PROCEDURES: VASCULAR ULTRASOUND ABI WITH/WITHOUT TBI performed on 03/20/2022 Right: Resting right ankle-brachial index is within normal range. The right toe-brachial index is normal. Improved s/p PTA and stents. Left: Resting left ankle-brachial index indicates mild left lower extremity arterial disease. The left toe-brachial index is abnormal.   LEFT HEART CATHETERIZATION AND CORONARY ANGIOGRAPHY performed on 12/29/2020 Normal left ventricular systolic function with an EF of 53% Patent stents to the RCA and proximal LAD Multivessel CAD 50% mid RCA 65% RPDA 45% mid to distal RCA 60% ostial to proximal RCA 95% ostial proximal LCx 60% ramus intermedius 30% proximal LAD 30 stent mid LM Recommendations No evidence of myocardial infarction or ACS -no further cardiac intervention at this time Counseled on discontinuation of tobacco abuse Cardiac rehabilitation for angina Continue risk factor modification with current medical regimen   MYOCARDIAL PERFUSION IMAGING STUDY ([facility]) performed on  02/18/2019 Normal left ventricular systolic function with a normal LVEF of 53% Normal myocardial thickening and wall motion Left ventricular cavity size normal SPECT images demonstrate homogenous tracer distribution throughout the myocardium No evidence of stress-induced myocardial ischemia or arrhythmia Normal low risk study  Impression and Plan:  MARTINO TOMPSON has been referred for pre-anesthesia review and clearance prior to him undergoing the planned anesthetic and procedural courses. Available labs, pertinent testing, and imaging results were personally reviewed by me. This patient has been appropriately cleared by cardiology with an overall LOW risk of significant perioperative cardiovascular complications.  Based on clinical review performed today (04/09/22), barring any significant acute changes in the patient's overall condition, it is anticipated that he will be able to proceed with the planned surgical intervention. Any acute changes in clinical condition may necessitate his procedure being postponed and/or cancelled. Patient will meet with anesthesia team (MD and/or CRNA) on the day of his procedure for preoperative evaluation/assessment. Questions regarding anesthetic course will be fielded at that time.   Pre-surgical instructions were reviewed with the patient during his PAT appointment and questions were fielded by PAT clinical staff. Patient was advised that if any questions or concerns arise prior to his procedure then he should return a call to PAT and/or his surgeon's office to discuss.  Honor Loh, MSN, APRN, FNP-C, CEN [facility]  Peri-operative Services Nurse Practitioner Phone: [phone] Fax: [phone] 04/09/22 2:18 PM  NOTE: This note has been prepared using Dragon dictation software. Despite my best ability to proofread, there is always the potential that unintentional transcriptional errors may still occur from this process.

## 2022-04-10 DIAGNOSIS — I82409 Acute embolism and thrombosis of unspecified deep veins of unspecified lower extremity: Secondary | ICD-10-CM

## 2022-04-10 HISTORY — DX: Acute embolism and thrombosis of unspecified deep veins of unspecified lower extremity: I82.409

## 2022-04-11 ENCOUNTER — Inpatient Hospital Stay
Admission: RE | Admit: 2022-04-11 | Discharge: 2022-04-16 | DRG: 254 | Disposition: A | Discharge status: Home / Self Care | Payer: PPO | Attending: Vascular Surgery | Admitting: Vascular Surgery

## 2022-04-11 ENCOUNTER — Inpatient Hospital Stay: Payer: PPO | Admitting: Urgent Care

## 2022-04-11 ENCOUNTER — Inpatient Hospital Stay: Payer: PPO

## 2022-04-11 ENCOUNTER — Other Ambulatory Visit: Payer: Self-pay

## 2022-04-11 ENCOUNTER — Encounter
Admission: RE | Disposition: A | Discharge status: Home / Self Care | Payer: Self-pay | Source: Home / Self Care | Attending: Vascular Surgery

## 2022-04-11 ENCOUNTER — Encounter: Payer: Self-pay | Admitting: Vascular Surgery

## 2022-04-11 DIAGNOSIS — Z955 Presence of coronary angioplasty implant and graft: Secondary | ICD-10-CM

## 2022-04-11 DIAGNOSIS — Z9842 Cataract extraction status, left eye: Secondary | ICD-10-CM | POA: Diagnosis not present

## 2022-04-11 DIAGNOSIS — K219 Gastro-esophageal reflux disease without esophagitis: Secondary | ICD-10-CM | POA: Diagnosis not present

## 2022-04-11 DIAGNOSIS — I70212 Atherosclerosis of native arteries of extremities with intermittent claudication, left leg: Secondary | ICD-10-CM | POA: Diagnosis not present

## 2022-04-11 DIAGNOSIS — I70219 Atherosclerosis of native arteries of extremities with intermittent claudication, unspecified extremity: Secondary | ICD-10-CM | POA: Diagnosis present

## 2022-04-11 DIAGNOSIS — I10 Essential (primary) hypertension: Secondary | ICD-10-CM | POA: Diagnosis present

## 2022-04-11 DIAGNOSIS — H409 Unspecified glaucoma: Secondary | ICD-10-CM | POA: Diagnosis present

## 2022-04-11 DIAGNOSIS — Z888 Allergy status to other drugs, medicaments and biological substances status: Secondary | ICD-10-CM

## 2022-04-11 DIAGNOSIS — K760 Fatty (change of) liver, not elsewhere classified: Secondary | ICD-10-CM | POA: Diagnosis not present

## 2022-04-11 DIAGNOSIS — Z961 Presence of intraocular lens: Secondary | ICD-10-CM | POA: Diagnosis not present

## 2022-04-11 DIAGNOSIS — M109 Gout, unspecified: Secondary | ICD-10-CM | POA: Diagnosis not present

## 2022-04-11 DIAGNOSIS — I70213 Atherosclerosis of native arteries of extremities with intermittent claudication, bilateral legs: Secondary | ICD-10-CM | POA: Diagnosis not present

## 2022-04-11 DIAGNOSIS — I700 Atherosclerosis of aorta: Secondary | ICD-10-CM | POA: Diagnosis not present

## 2022-04-11 DIAGNOSIS — Z7982 Long term (current) use of aspirin: Secondary | ICD-10-CM

## 2022-04-11 DIAGNOSIS — Z8041 Family history of malignant neoplasm of ovary: Secondary | ICD-10-CM | POA: Diagnosis not present

## 2022-04-11 DIAGNOSIS — I2510 Atherosclerotic heart disease of native coronary artery without angina pectoris: Secondary | ICD-10-CM | POA: Diagnosis present

## 2022-04-11 DIAGNOSIS — J449 Chronic obstructive pulmonary disease, unspecified: Secondary | ICD-10-CM | POA: Diagnosis not present

## 2022-04-11 DIAGNOSIS — Z9889 Other specified postprocedural states: Secondary | ICD-10-CM | POA: Diagnosis not present

## 2022-04-11 DIAGNOSIS — E782 Mixed hyperlipidemia: Secondary | ICD-10-CM | POA: Diagnosis not present

## 2022-04-11 DIAGNOSIS — Z7902 Long term (current) use of antithrombotics/antiplatelets: Secondary | ICD-10-CM

## 2022-04-11 DIAGNOSIS — Z9841 Cataract extraction status, right eye: Secondary | ICD-10-CM | POA: Diagnosis not present

## 2022-04-11 DIAGNOSIS — I70203 Unspecified atherosclerosis of native arteries of extremities, bilateral legs: Secondary | ICD-10-CM | POA: Diagnosis present

## 2022-04-11 DIAGNOSIS — Z91041 Radiographic dye allergy status: Secondary | ICD-10-CM | POA: Diagnosis not present

## 2022-04-11 DIAGNOSIS — F17210 Nicotine dependence, cigarettes, uncomplicated: Secondary | ICD-10-CM | POA: Diagnosis present

## 2022-04-11 DIAGNOSIS — I70222 Atherosclerosis of native arteries of extremities with rest pain, left leg: Secondary | ICD-10-CM | POA: Diagnosis not present

## 2022-04-11 DIAGNOSIS — Z833 Family history of diabetes mellitus: Secondary | ICD-10-CM

## 2022-04-11 DIAGNOSIS — E785 Hyperlipidemia, unspecified: Secondary | ICD-10-CM | POA: Diagnosis present

## 2022-04-11 DIAGNOSIS — Z79899 Other long term (current) drug therapy: Secondary | ICD-10-CM | POA: Diagnosis not present

## 2022-04-11 DIAGNOSIS — Z8249 Family history of ischemic heart disease and other diseases of the circulatory system: Secondary | ICD-10-CM | POA: Diagnosis not present

## 2022-04-11 DIAGNOSIS — I70211 Atherosclerosis of native arteries of extremities with intermittent claudication, right leg: Secondary | ICD-10-CM

## 2022-04-11 DIAGNOSIS — K5900 Constipation, unspecified: Secondary | ICD-10-CM | POA: Diagnosis not present

## 2022-04-11 HISTORY — DX: Long term (current) use of antithrombotics/antiplatelets: Z79.02

## 2022-04-11 HISTORY — DX: Fatty (change of) liver, not elsewhere classified: K76.0

## 2022-04-11 HISTORY — PX: LOWER EXTREMITY ANGIOGRAM: SHX5508

## 2022-04-11 HISTORY — PX: ENDARTERECTOMY FEMORAL: SHX5804

## 2022-04-11 HISTORY — DX: Atherosclerosis of aorta: I70.0

## 2022-04-11 LAB — BASIC METABOLIC PANEL
Anion gap: 5 (ref 5–15)
BUN: 19 mg/dL (ref 8–23)
CO2: 28 mmol/L (ref 22–32)
Calcium: 9.4 mg/dL (ref 8.9–10.3)
Chloride: 101 mmol/L (ref 98–111)
Creatinine, Ser: 1 mg/dL (ref 0.61–1.24)
GFR, Estimated: >60 mL/min (ref >60)
Glucose, Bld: 108 mg/dL — ABNORMAL HIGH (ref 70–99)
Potassium: 4.9 mmol/L (ref 3.5–5.1)
Sodium: 134 mmol/L — ABNORMAL LOW (ref 135–145)

## 2022-04-11 LAB — CBC WITH DIFFERENTIAL/PLATELET
Abs Immature Granulocytes: 0.02 10*3/uL (ref 0.00–0.07)
Basophils Absolute: 0 10*3/uL (ref 0.0–0.1)
Basophils Relative: 1 %
Eosinophils Absolute: 0.1 10*3/uL (ref 0.0–0.5)
Eosinophils Relative: 2 %
HCT: 48.6 % (ref 39.0–52.0)
Hemoglobin: 16.1 g/dL (ref 13.0–17.0)
Immature Granulocytes: 0 %
Lymphocytes Relative: 33 %
Lymphs Abs: 2.5 10*3/uL (ref 0.7–4.0)
MCH: 32.5 pg (ref 26.0–34.0)
MCHC: 33.1 g/dL (ref 30.0–36.0)
MCV: 98 fL (ref 80.0–100.0)
Monocytes Absolute: 0.6 10*3/uL (ref 0.1–1.0)
Monocytes Relative: 8 %
Neutro Abs: 4.3 10*3/uL (ref 1.7–7.7)
Neutrophils Relative %: 56 %
Platelets: 244 10*3/uL (ref 150–400)
RBC: 4.96 MIL/uL (ref 4.22–5.81)
RDW: 13.5 % (ref 11.5–15.5)
WBC: 7.6 10*3/uL (ref 4.0–10.5)
nRBC: 0 % (ref 0.0–0.2)

## 2022-04-11 LAB — GLUCOSE, CAPILLARY: Glucose-Capillary: 113 mg/dL — ABNORMAL HIGH (ref 70–99)

## 2022-04-11 LAB — TYPE AND SCREEN
ABO/RH(D): O POS
Antibody Screen: NEGATIVE

## 2022-04-11 LAB — ABO/RH: ABO/RH(D): O POS

## 2022-04-11 LAB — MRSA NEXT GEN BY PCR, NASAL: MRSA by PCR Next Gen: NOT DETECTED

## 2022-04-11 SURGERY — ENDARTERECTOMY, FEMORAL
Anesthesia: General

## 2022-04-11 MED ORDER — STERILE WATER FOR IRRIGATION IR SOLN
Status: DC | PRN
  Administered 2022-04-11: 500 mL

## 2022-04-11 MED ORDER — SODIUM CHLORIDE 0.9 % IV SOLN
INTRAVENOUS | Status: DC
  Administered 2022-04-11: 100 mL/h via INTRAVENOUS

## 2022-04-11 MED ORDER — VANCOMYCIN HCL 1000 MG IV SOLR
INTRAVENOUS | Status: DC | PRN
  Administered 2022-04-11: 1000 mg

## 2022-04-11 MED ORDER — CHLORHEXIDINE GLUCONATE CLOTH 2 % EX PADS
6.0000 | MEDICATED_PAD | Freq: Every day | CUTANEOUS | Status: DC
  Administered 2022-04-11 – 2022-04-14 (×3): 6 via TOPICAL

## 2022-04-11 MED ORDER — 0.9 % SODIUM CHLORIDE (POUR BTL) OPTIME
TOPICAL | Status: DC | PRN
  Administered 2022-04-11: 500 mL

## 2022-04-11 MED ORDER — OXYCODONE-ACETAMINOPHEN 5-325 MG PO TABS
1.0000 | ORAL_TABLET | ORAL | Status: DC | PRN
  Administered 2022-04-11 – 2022-04-12 (×2): 2 via ORAL
  Administered 2022-04-12: 1 via ORAL
  Administered 2022-04-12 – 2022-04-15 (×10): 2 via ORAL
  Administered 2022-04-15: 1 via ORAL
  Filled 2022-04-11 (×10): qty 2
  Filled 2022-04-11: qty 1
  Filled 2022-04-11 (×3): qty 2
  Filled 2022-04-11: qty 1

## 2022-04-11 MED ORDER — ONDANSETRON HCL 4 MG/2ML IJ SOLN
INTRAMUSCULAR | Status: DC | PRN
  Administered 2022-04-11: 4 mg via INTRAVENOUS

## 2022-04-11 MED ORDER — ONDANSETRON HCL 4 MG/2ML IJ SOLN
INTRAMUSCULAR | Status: AC
  Filled 2022-04-11: qty 2

## 2022-04-11 MED ORDER — ACETAMINOPHEN 325 MG PO TABS: 325.0000 mg | ORAL_TABLET | ORAL | Status: DC | PRN

## 2022-04-11 MED ORDER — METOPROLOL TARTRATE 50 MG PO TABS: 50.0000 mg | ORAL_TABLET | Freq: Two times a day (BID) | ORAL | Status: DC

## 2022-04-11 MED ORDER — "VISTASEAL 4 ML SINGLE DOSE KIT "
PACK | CUTANEOUS | Status: DC | PRN
  Administered 2022-04-11: 4 mL via TOPICAL

## 2022-04-11 MED ORDER — ACETAMINOPHEN 10 MG/ML IV SOLN
INTRAVENOUS | Status: AC
  Filled 2022-04-11: qty 100

## 2022-04-11 MED ORDER — MIDAZOLAM HCL 2 MG/2ML IJ SOLN
INTRAMUSCULAR | Status: DC | PRN
  Administered 2022-04-11: 2 mg via INTRAVENOUS

## 2022-04-11 MED ORDER — EPHEDRINE 5 MG/ML INJ
INTRAVENOUS | Status: AC
  Filled 2022-04-11: qty 5

## 2022-04-11 MED ORDER — HEPARIN SODIUM (PORCINE) 5000 UNIT/ML IJ SOLN
INTRAMUSCULAR | Status: AC
  Filled 2022-04-11: qty 1

## 2022-04-11 MED ORDER — SODIUM CHLORIDE 0.9 % IV SOLN
INTRAVENOUS | Status: DC | PRN
  Administered 2022-04-11: 501 mL

## 2022-04-11 MED ORDER — ALUM & MAG HYDROXIDE-SIMETH 200-200-20 MG/5ML PO SUSP
15.0000 mL | ORAL | Status: DC | PRN
  Administered 2022-04-15: 30 mL via ORAL
  Filled 2022-04-11: qty 30

## 2022-04-11 MED ORDER — SUGAMMADEX SODIUM 200 MG/2ML IV SOLN
INTRAVENOUS | Status: DC | PRN
  Administered 2022-04-11: 200 mg via INTRAVENOUS

## 2022-04-11 MED ORDER — ISOSORBIDE MONONITRATE ER 30 MG PO TB24
120.0000 mg | ORAL_TABLET | Freq: Every day | ORAL | Status: DC
  Administered 2022-04-12 – 2022-04-16 (×5): 120 mg via ORAL
  Filled 2022-04-11 (×2): qty 2
  Filled 2022-04-11 (×3): qty 4

## 2022-04-11 MED ORDER — OXYCODONE HCL 5 MG/5ML PO SOLN: 5.0000 mg | Freq: Once | ORAL | Status: DC | PRN

## 2022-04-11 MED ORDER — EPHEDRINE SULFATE (PRESSORS) 50 MG/ML IJ SOLN
INTRAMUSCULAR | Status: DC | PRN
  Administered 2022-04-11: 10 mg via INTRAVENOUS
  Administered 2022-04-11: 5 mg via INTRAVENOUS

## 2022-04-11 MED ORDER — ASPIRIN 81 MG PO TBEC
81.0000 mg | DELAYED_RELEASE_TABLET | Freq: Every day | ORAL | Status: DC
  Administered 2022-04-12 – 2022-04-16 (×5): 81 mg via ORAL
  Filled 2022-04-11 (×5): qty 1

## 2022-04-11 MED ORDER — LABETALOL HCL 5 MG/ML IV SOLN: 10.0000 mg | INTRAVENOUS | Status: DC | PRN

## 2022-04-11 MED ORDER — HYDRALAZINE HCL 20 MG/ML IJ SOLN: 5.0000 mg | INTRAMUSCULAR | Status: DC | PRN

## 2022-04-11 MED ORDER — FENTANYL CITRATE (PF) 100 MCG/2ML IJ SOLN: 25.0000 ug | INTRAMUSCULAR | Status: DC | PRN

## 2022-04-11 MED ORDER — ACETAMINOPHEN 650 MG RE SUPP: 325.0000 mg | RECTAL | Status: DC | PRN

## 2022-04-11 MED ORDER — ENOXAPARIN SODIUM 40 MG/0.4ML IJ SOSY
40.0000 mg | PREFILLED_SYRINGE | INTRAMUSCULAR | Status: DC
  Administered 2022-04-12 – 2022-04-16 (×5): 40 mg via SUBCUTANEOUS
  Filled 2022-04-11 (×5): qty 0.4

## 2022-04-11 MED ORDER — CHLORHEXIDINE GLUCONATE 0.12 % MT SOLN
OROMUCOSAL | Status: AC
  Administered 2022-04-11: 15 mL via OROMUCOSAL
  Filled 2022-04-11: qty 15

## 2022-04-11 MED ORDER — GUAIFENESIN-DM 100-10 MG/5ML PO SYRP
15.0000 mL | ORAL_SOLUTION | ORAL | Status: DC | PRN
  Administered 2022-04-13 – 2022-04-15 (×5): 15 mL via ORAL
  Filled 2022-04-11 (×5): qty 20

## 2022-04-11 MED ORDER — DOCUSATE SODIUM 100 MG PO CAPS
100.0000 mg | ORAL_CAPSULE | Freq: Every day | ORAL | Status: DC
  Administered 2022-04-12 – 2022-04-16 (×5): 100 mg via ORAL
  Filled 2022-04-11 (×5): qty 1

## 2022-04-11 MED ORDER — ONDANSETRON HCL 4 MG/2ML IJ SOLN: 4.0000 mg | Freq: Four times a day (QID) | INTRAMUSCULAR | Status: DC | PRN

## 2022-04-11 MED ORDER — ROCURONIUM BROMIDE 10 MG/ML (PF) SYRINGE
PREFILLED_SYRINGE | INTRAVENOUS | Status: AC
  Filled 2022-04-11: qty 10

## 2022-04-11 MED ORDER — CEFAZOLIN SODIUM-DEXTROSE 2-4 GM/100ML-% IV SOLN
INTRAVENOUS | Status: AC
  Filled 2022-04-11: qty 100

## 2022-04-11 MED ORDER — ORAL CARE MOUTH RINSE: 15.0000 mL | Freq: Once | OROMUCOSAL | Status: AC

## 2022-04-11 MED ORDER — IODIXANOL 320 MG/ML IV SOLN
INTRAVENOUS | Status: DC | PRN
  Administered 2022-04-11: 40 mL

## 2022-04-11 MED ORDER — ROCURONIUM BROMIDE 100 MG/10ML IV SOLN
INTRAVENOUS | Status: DC | PRN
  Administered 2022-04-11 (×2): 20 mg via INTRAVENOUS
  Administered 2022-04-11: 60 mg via INTRAVENOUS
  Administered 2022-04-11: 20 mg via INTRAVENOUS

## 2022-04-11 MED ORDER — SIMVASTATIN 20 MG PO TABS
40.0000 mg | ORAL_TABLET | Freq: Every day | ORAL | Status: DC
  Administered 2022-04-11 – 2022-04-15 (×5): 40 mg via ORAL
  Filled 2022-04-11 (×5): qty 2

## 2022-04-11 MED ORDER — FAMOTIDINE 20 MG PO TABS: 20.0000 mg | ORAL_TABLET | Freq: Once | ORAL | Status: AC

## 2022-04-11 MED ORDER — PROPOFOL 10 MG/ML IV BOLUS
INTRAVENOUS | Status: DC | PRN
  Administered 2022-04-11: 120 mg via INTRAVENOUS

## 2022-04-11 MED ORDER — SODIUM CHLORIDE 0.9 % IV SOLN: 500.0000 mL | Freq: Once | INTRAVENOUS | Status: DC | PRN

## 2022-04-11 MED ORDER — PROPOFOL 10 MG/ML IV BOLUS
INTRAVENOUS | Status: AC
  Filled 2022-04-11: qty 20

## 2022-04-11 MED ORDER — HEMOSTATIC AGENTS (NO CHARGE) OPTIME
TOPICAL | Status: DC | PRN
  Administered 2022-04-11: 1 via TOPICAL

## 2022-04-11 MED ORDER — CLOPIDOGREL BISULFATE 75 MG PO TABS
75.0000 mg | ORAL_TABLET | Freq: Every day | ORAL | Status: DC
  Administered 2022-04-11 – 2022-04-16 (×6): 75 mg via ORAL
  Filled 2022-04-11 (×6): qty 1

## 2022-04-11 MED ORDER — METOPROLOL TARTRATE 5 MG/5ML IV SOLN: 2.0000 mg | INTRAVENOUS | Status: DC | PRN

## 2022-04-11 MED ORDER — CEFAZOLIN SODIUM-DEXTROSE 2-4 GM/100ML-% IV SOLN
2.0000 g | INTRAVENOUS | Status: AC
  Administered 2022-04-11: 2 g via INTRAVENOUS

## 2022-04-11 MED ORDER — SENNOSIDES-DOCUSATE SODIUM 8.6-50 MG PO TABS
1.0000 | ORAL_TABLET | Freq: Every evening | ORAL | Status: DC | PRN
  Administered 2022-04-14 – 2022-04-15 (×2): 1 via ORAL
  Filled 2022-04-11 (×2): qty 1

## 2022-04-11 MED ORDER — OXYCODONE HCL 5 MG PO TABS: 5.0000 mg | ORAL_TABLET | Freq: Once | ORAL | Status: DC | PRN

## 2022-04-11 MED ORDER — KETAMINE HCL 10 MG/ML IJ SOLN
INTRAMUSCULAR | Status: DC | PRN
  Administered 2022-04-11: 30 mg via INTRAVENOUS
  Administered 2022-04-11: 20 mg via INTRAVENOUS

## 2022-04-11 MED ORDER — ISOSORBIDE MONONITRATE ER 60 MG PO TB24
120.0000 mg | ORAL_TABLET | Freq: Every day | ORAL | Status: DC
  Filled 2022-04-11: qty 2

## 2022-04-11 MED ORDER — LIDOCAINE HCL (PF) 2 % IJ SOLN
INTRAMUSCULAR | Status: AC
  Filled 2022-04-11: qty 5

## 2022-04-11 MED ORDER — CILOSTAZOL 100 MG PO TABS
50.0000 mg | ORAL_TABLET | Freq: Two times a day (BID) | ORAL | Status: DC
  Administered 2022-04-11 – 2022-04-16 (×11): 50 mg via ORAL
  Filled 2022-04-11 (×11): qty 0.5

## 2022-04-11 MED ORDER — LACTATED RINGERS IV SOLN: INTRAVENOUS | Status: DC

## 2022-04-11 MED ORDER — HEPARIN SODIUM (PORCINE) 1000 UNIT/ML IJ SOLN
INTRAMUSCULAR | Status: DC | PRN
  Administered 2022-04-11: 2000 [IU] via INTRAVENOUS
  Administered 2022-04-11: 6000 [IU] via INTRAVENOUS

## 2022-04-11 MED ORDER — KETAMINE HCL 50 MG/5ML IJ SOSY
PREFILLED_SYRINGE | INTRAMUSCULAR | Status: AC
  Filled 2022-04-11: qty 5

## 2022-04-11 MED ORDER — HEPARIN 30,000 UNITS/1000 ML (OHS) CELLSAVER SOLUTION
Status: AC
  Filled 2022-04-11: qty 1000

## 2022-04-11 MED ORDER — ALBUTEROL SULFATE (2.5 MG/3ML) 0.083% IN NEBU: 3.0000 mL | INHALATION_SOLUTION | Freq: Four times a day (QID) | RESPIRATORY_TRACT | Status: DC | PRN

## 2022-04-11 MED ORDER — METOPROLOL TARTRATE 50 MG PO TABS
75.0000 mg | ORAL_TABLET | Freq: Every day | ORAL | Status: DC
  Administered 2022-04-12 – 2022-04-15 (×4): 75 mg via ORAL
  Filled 2022-04-11 (×4): qty 1

## 2022-04-11 MED ORDER — DOPAMINE-DEXTROSE 3.2-5 MG/ML-% IV SOLN
3.0000 ug/kg/min | INTRAVENOUS | Status: DC
  Filled 2022-04-11: qty 250

## 2022-04-11 MED ORDER — PHENOL 1.4 % MT LIQD: 1.0000 | OROMUCOSAL | Status: DC | PRN

## 2022-04-11 MED ORDER — CHLORHEXIDINE GLUCONATE 0.12 % MT SOLN: 15.0000 mL | Freq: Once | OROMUCOSAL | Status: AC

## 2022-04-11 MED ORDER — MAGNESIUM SULFATE 2 GM/50ML IV SOLN: 2.0000 g | Freq: Every day | INTRAVENOUS | Status: DC | PRN

## 2022-04-11 MED ORDER — HYDROCORTISONE SOD SUC (PF) 100 MG IJ SOLR
INTRAMUSCULAR | Status: DC | PRN
  Administered 2022-04-11: 100 mg via INTRAVENOUS

## 2022-04-11 MED ORDER — FENTANYL CITRATE (PF) 250 MCG/5ML IJ SOLN
INTRAMUSCULAR | Status: AC
  Filled 2022-04-11: qty 5

## 2022-04-11 MED ORDER — PHENYLEPHRINE 80 MCG/ML (10ML) SYRINGE FOR IV PUSH (FOR BLOOD PRESSURE SUPPORT)
PREFILLED_SYRINGE | INTRAVENOUS | Status: AC
  Filled 2022-04-11: qty 10

## 2022-04-11 MED ORDER — VANCOMYCIN HCL 1000 MG IV SOLR
INTRAVENOUS | Status: AC
  Filled 2022-04-11: qty 20

## 2022-04-11 MED ORDER — PHENYLEPHRINE HCL-NACL 20-0.9 MG/250ML-% IV SOLN
INTRAVENOUS | Status: AC
  Filled 2022-04-11: qty 250

## 2022-04-11 MED ORDER — INDOMETHACIN 25 MG PO CAPS: 25.0000 mg | ORAL_CAPSULE | Freq: Three times a day (TID) | ORAL | Status: DC | PRN

## 2022-04-11 MED ORDER — MORPHINE SULFATE (PF) 2 MG/ML IV SOLN
2.0000 mg | INTRAVENOUS | Status: DC | PRN
  Administered 2022-04-11 (×2): 2 mg via INTRAVENOUS
  Administered 2022-04-11: 1 mg via INTRAVENOUS
  Administered 2022-04-11: 5 mg via INTRAVENOUS
  Administered 2022-04-12 (×2): 2 mg via INTRAVENOUS
  Filled 2022-04-11: qty 2
  Filled 2022-04-11 (×3): qty 1
  Filled 2022-04-11: qty 3

## 2022-04-11 MED ORDER — DIPHENHYDRAMINE HCL 50 MG/ML IJ SOLN
INTRAMUSCULAR | Status: DC | PRN
  Administered 2022-04-11: 50 mg via INTRAVENOUS

## 2022-04-11 MED ORDER — FAMOTIDINE 20 MG PO TABS
ORAL_TABLET | ORAL | Status: AC
  Administered 2022-04-11: 20 mg via ORAL
  Filled 2022-04-11: qty 1

## 2022-04-11 MED ORDER — NITROGLYCERIN 0.4 MG SL SUBL: 0.4000 mg | SUBLINGUAL_TABLET | SUBLINGUAL | Status: DC | PRN

## 2022-04-11 MED ORDER — MIDAZOLAM HCL 2 MG/2ML IJ SOLN
INTRAMUSCULAR | Status: AC
  Filled 2022-04-11: qty 2

## 2022-04-11 MED ORDER — CEFAZOLIN SODIUM-DEXTROSE 2-4 GM/100ML-% IV SOLN
2.0000 g | Freq: Three times a day (TID) | INTRAVENOUS | Status: AC
  Administered 2022-04-11 (×2): 2 g via INTRAVENOUS
  Filled 2022-04-11 (×2): qty 100

## 2022-04-11 MED ORDER — METOPROLOL TARTRATE 50 MG PO TABS
50.0000 mg | ORAL_TABLET | Freq: Every morning | ORAL | Status: DC
  Administered 2022-04-12 – 2022-04-16 (×5): 50 mg via ORAL
  Filled 2022-04-11 (×6): qty 1

## 2022-04-11 MED ORDER — CHLORHEXIDINE GLUCONATE CLOTH 2 % EX PADS: 6.0000 | MEDICATED_PAD | Freq: Once | CUTANEOUS | Status: DC

## 2022-04-11 MED ORDER — PHENYLEPHRINE HCL-NACL 20-0.9 MG/250ML-% IV SOLN
INTRAVENOUS | Status: DC | PRN
  Administered 2022-04-11: 20 ug/min via INTRAVENOUS

## 2022-04-11 MED ORDER — SORBITOL 70 % SOLN
30.0000 mL | Freq: Every day | Status: DC | PRN
  Administered 2022-04-16: 30 mL via ORAL
  Filled 2022-04-11 (×2): qty 30

## 2022-04-11 MED ORDER — FENTANYL CITRATE (PF) 100 MCG/2ML IJ SOLN
INTRAMUSCULAR | Status: DC | PRN
  Administered 2022-04-11 (×4): 50 ug via INTRAVENOUS

## 2022-04-11 MED ORDER — NITROGLYCERIN IN D5W 200-5 MCG/ML-% IV SOLN: 5.0000 ug/min | INTRAVENOUS | Status: DC

## 2022-04-11 MED ORDER — LISINOPRIL 10 MG PO TABS
10.0000 mg | ORAL_TABLET | Freq: Every day | ORAL | Status: DC
  Administered 2022-04-11 – 2022-04-16 (×6): 10 mg via ORAL
  Filled 2022-04-11 (×2): qty 1
  Filled 2022-04-11: qty 2
  Filled 2022-04-11: qty 1
  Filled 2022-04-11 (×2): qty 2

## 2022-04-11 MED ORDER — ACETAMINOPHEN 10 MG/ML IV SOLN
INTRAVENOUS | Status: DC | PRN
  Administered 2022-04-11: 1000 mg via INTRAVENOUS

## 2022-04-11 MED ORDER — PHENYLEPHRINE 80 MCG/ML (10ML) SYRINGE FOR IV PUSH (FOR BLOOD PRESSURE SUPPORT)
PREFILLED_SYRINGE | INTRAVENOUS | Status: DC | PRN
  Administered 2022-04-11: 80 ug via INTRAVENOUS
  Administered 2022-04-11: 160 ug via INTRAVENOUS

## 2022-04-11 MED ORDER — FAMOTIDINE IN NACL 20-0.9 MG/50ML-% IV SOLN
20.0000 mg | Freq: Two times a day (BID) | INTRAVENOUS | Status: DC
  Administered 2022-04-11 – 2022-04-12 (×3): 20 mg via INTRAVENOUS
  Filled 2022-04-11 (×3): qty 50

## 2022-04-11 MED ORDER — POTASSIUM CHLORIDE CRYS ER 20 MEQ PO TBCR: 20.0000 meq | EXTENDED_RELEASE_TABLET | Freq: Every day | ORAL | Status: DC | PRN

## 2022-04-11 MED ORDER — SODIUM CHLORIDE 0.9 % IV SOLN: INTRAVENOUS | Status: DC | PRN

## 2022-04-11 MED ORDER — LIDOCAINE HCL (CARDIAC) PF 100 MG/5ML IV SOSY
PREFILLED_SYRINGE | INTRAVENOUS | Status: DC | PRN
  Administered 2022-04-11: 100 mg via INTRAVENOUS

## 2022-04-11 SURGICAL SUPPLY — 72 items
BAG DECANTER FOR FLEXI CONT (MISCELLANEOUS) ×2 IMPLANT
BALLN LUTONIX 018 5X60X130 (BALLOONS) ×2
BALLN LUTONIX DCB 7X40X130 (BALLOONS) ×2
BALLOON LUTONIX 018 5X60X130 (BALLOONS) IMPLANT
BALLOON LUTONIX DCB 7X40X130 (BALLOONS) IMPLANT
BLADE SURG 15 STRL LF DISP TIS (BLADE) ×2 IMPLANT
BLADE SURG 15 STRL SS (BLADE) ×2
BLADE SURG SZ11 CARB STEEL (BLADE) ×2 IMPLANT
BOOT SUTURE AID YELLOW STND (SUTURE) ×2 IMPLANT
BRUSH SCRUB EZ  4% CHG (MISCELLANEOUS) ×2
BRUSH SCRUB EZ 4% CHG (MISCELLANEOUS) ×2 IMPLANT
CHLORAPREP W/TINT 26 (MISCELLANEOUS) ×2 IMPLANT
DERMABOND ADVANCED .7 DNX12 (GAUZE/BANDAGES/DRESSINGS) ×2 IMPLANT
DRAPE INCISE IOBAN 66X45 STRL (DRAPES) ×2 IMPLANT
ELECT CAUTERY BLADE 6.4 (BLADE) ×2 IMPLANT
ELECT REM PT RETURN 9FT ADLT (ELECTROSURGICAL) ×2
ELECTRODE REM PT RTRN 9FT ADLT (ELECTROSURGICAL) ×2 IMPLANT
GAUZE 4X4 16PLY ~~LOC~~+RFID DBL (SPONGE) ×2 IMPLANT
GLOVE BIO SURGEON STRL SZ7 (GLOVE) ×4 IMPLANT
GOWN STRL REUS W/ TWL LRG LVL3 (GOWN DISPOSABLE) ×2 IMPLANT
GOWN STRL REUS W/ TWL XL LVL3 (GOWN DISPOSABLE) ×4 IMPLANT
GOWN STRL REUS W/TWL LRG LVL3 (GOWN DISPOSABLE) ×2
GOWN STRL REUS W/TWL XL LVL3 (GOWN DISPOSABLE) ×4
GRAFT VASC PATCH XENOSURE 1X14 (Vascular Products) IMPLANT
HEMOSTAT SURGICEL 2X3 (HEMOSTASIS) ×2 IMPLANT
IV NS 500ML (IV SOLUTION) ×2
IV NS 500ML BAXH (IV SOLUTION) ×2 IMPLANT
KIT ENCORE 26 ADVANTAGE (KITS) IMPLANT
KIT PREVENA INCISION MGT20CM45 (CANNISTER) IMPLANT
KIT TURNOVER KIT A (KITS) ×2 IMPLANT
LABEL OR SOLS (LABEL) ×2 IMPLANT
LOOP RED MAXI  1X406MM (MISCELLANEOUS) ×4
LOOP VESSEL MAXI  1X406 RED (MISCELLANEOUS) ×4
LOOP VESSEL MAXI 1X406 RED (MISCELLANEOUS) ×4 IMPLANT
LOOP VESSEL MINI 0.8X406 BLUE (MISCELLANEOUS) ×4 IMPLANT
LOOPS BLUE MINI 0.8X406MM (MISCELLANEOUS) ×6
MANIFOLD NEPTUNE II (INSTRUMENTS) ×2 IMPLANT
NDL SAFETY ECLIP 18X1.5 (MISCELLANEOUS) ×2 IMPLANT
NS IRRIG 500ML POUR BTL (IV SOLUTION) ×2 IMPLANT
PACK ANGIOGRAPHY (CUSTOM PROCEDURE TRAY) IMPLANT
PACK BASIN MAJOR ARMC (MISCELLANEOUS) ×2 IMPLANT
PACK UNIVERSAL (MISCELLANEOUS) ×2 IMPLANT
SET WALTER ACTIVATION W/DRAPE (SET/KITS/TRAYS/PACK) ×2 IMPLANT
SHEATH BRITE TIP 6FRX11 (SHEATH) IMPLANT
SHEATH BRITE TIP 7FRX11 (SHEATH) IMPLANT
SPONGE T-LAP 18X18 ~~LOC~~+RFID (SPONGE) ×4 IMPLANT
STENT VIABAHN 6X50X120 (Permanent Stent) IMPLANT
STENT VIABAHN 8X2.5X120 (Permanent Stent) IMPLANT
STENT VIABAHN 8X25X120 (Permanent Stent) ×2 IMPLANT
SUT MNCRL 4-0 (SUTURE) ×2
SUT MNCRL 4-0 27XMFL (SUTURE) ×2
SUT PROLENE 5 0 RB 1 DA (SUTURE) ×4 IMPLANT
SUT PROLENE 6 0 BV (SUTURE) ×8 IMPLANT
SUT PROLENE 7 0 BV 1 (SUTURE) ×4 IMPLANT
SUT SILK 2 0 (SUTURE) ×2
SUT SILK 2-0 18XBRD TIE 12 (SUTURE) ×2 IMPLANT
SUT SILK 3 0 (SUTURE) ×2
SUT SILK 3-0 18XBRD TIE 12 (SUTURE) ×2 IMPLANT
SUT SILK 4 0 (SUTURE) ×2
SUT SILK 4-0 18XBRD TIE 12 (SUTURE) ×2 IMPLANT
SUT VIC AB 2-0 CT1 27 (SUTURE) ×4
SUT VIC AB 2-0 CT1 TAPERPNT 27 (SUTURE) ×4 IMPLANT
SUT VIC AB 3-0 SH 27 (SUTURE) ×2
SUT VIC AB 3-0 SH 27X BRD (SUTURE) ×2 IMPLANT
SUT VICRYL+ 3-0 36IN CT-1 (SUTURE) ×4 IMPLANT
SUTURE MNCRL 4-0 27XMF (SUTURE) ×2 IMPLANT
SYR 20ML LL LF (SYRINGE) ×2 IMPLANT
TRAP FLUID SMOKE EVACUATOR (MISCELLANEOUS) ×2 IMPLANT
TRAY FOLEY MTR SLVR 16FR STAT (SET/KITS/TRAYS/PACK) ×2 IMPLANT
WATER STERILE IRR 500ML POUR (IV SOLUTION) ×2 IMPLANT
WIRE G 018X200 V18 (WIRE) IMPLANT
WIRE GUIDERIGHT .035X150 (WIRE) IMPLANT

## 2022-04-11 NOTE — Interval H&P Note (Signed)
History and Physical Interval Note:  04/11/2022 8:17 AM  Aaron [name]  has presented today for surgery, with the diagnosis of ASO WITH CLAUDICATION.  The various methods of treatment have been discussed with the patient and family. After consideration of risks, benefits and other options for treatment, the patient has consented to  Procedure(s): ENDARTERECTOMY FEMORAL ( SFA & TIBIAL) (Left) LOWER EXTREMITY ANGIOGRAM (Left) APPLICATION OF CELL SAVER (N/A) as a surgical intervention.  The patient's history has been reviewed, patient examined, no change in status, stable for surgery.  I have reviewed the patient's chart and labs.  Questions were answered to the patient's satisfaction.     [name]

## 2022-04-11 NOTE — Anesthesia Procedure Notes (Signed)
Arterial Line Insertion Start/End10/10/2021 8:45 AM, 04/11/2022 8:55 AM Performed by: Dimas Millin, MD, Esaw Grandchild, CRNA, CRNA  Patient location: OR. Preanesthetic checklist: patient identified, IV checked, site marked, risks and benefits discussed, surgical consent, monitors and equipment checked, pre-op evaluation, timeout performed and anesthesia consent Patient sedated radial was placed Catheter size: 20 G Hand hygiene performed  and Seldinger technique used Allen's test indicative of satisfactory collateral circulation Attempts: 2 Procedure performed using ultrasound guided technique. Ultrasound Notes:anatomy identified, needle tip was noted to be adjacent to the nerve/plexus identified and no ultrasound evidence of intravascular and/or intraneural injection Following insertion, Biopatch and dressing applied. Post procedure assessment: normal  Patient tolerated the procedure well with no immediate complications.

## 2022-04-11 NOTE — Transfer of Care (Signed)
Immediate Anesthesia Transfer of Care Note  Patient: Aaron [name]  Procedure(s) Performed: ENDARTERECTOMY FEMORAL ( SFA & TIBIAL); insertion of SFA stent (Left) LOWER EXTREMITY ANGIOGRAM (Left) APPLICATION OF CELL SAVER  Patient Location: PACU  Anesthesia Type:General  Level of Consciousness: drowsy  Airway & Oxygen Therapy: Patient Spontanous Breathing and Patient connected to face mask oxygen  Post-op Assessment: Report given to RN, Post -op Vital signs reviewed and stable and Patient moving all extremities  Post vital signs: Reviewed and stable  Last Vitals:  Vitals Value Taken Time  BP 108/61 04/11/22 1300  Temp    Pulse 59 04/11/22 1303  Resp 17 04/11/22 1303  SpO2 98 % 04/11/22 1303  Vitals shown include unvalidated device data.  Last Pain:  Vitals:   04/11/22 0744  TempSrc: Oral  PainSc: 0-No pain         Complications: No notable events documented.

## 2022-04-11 NOTE — Anesthesia Preprocedure Evaluation (Addendum)
Anesthesia Evaluation  Patient identified by MRN, date of birth, ID band Patient awake    Reviewed: Allergy & Precautions, NPO status , Patient's Chart, lab work & pertinent test results  Airway Mallampati: III  TM Distance: >3 FB Neck ROM: full    Dental  (+) Dental Advidsory Given, Poor Dentition   Pulmonary neg shortness of breath, COPD,  COPD inhaler, Current Smoker,    Pulmonary exam normal        Cardiovascular hypertension, (-) angina+ CAD  (-) Past MI and (-) CABG Normal cardiovascular exam     Neuro/Psych negative neurological ROS  negative psych ROS   GI/Hepatic negative GI ROS, Neg liver ROS,   Endo/Other  negative endocrine ROS  Renal/GU Renal disease     Musculoskeletal   Abdominal   Peds  Hematology negative hematology ROS (+)   Anesthesia Other Findings Past Medical History: No date: Aortic atherosclerosis (HCC) No date: COPD (chronic obstructive pulmonary disease) ([place]) 06/19/2010: Coronary artery disease     Comment:  a.) LHC/PCI (unknown date) --> stent to pRCA and pLAD               (unknown type); b.) LHC 06/19/2010: 30 mLM, 60 mLAD, 60               pLCx, 50 pRI, 40 ISR pRCA, 50 dRCA, 50 RPDA, 50 RPLS -->               med mgmt; c.) LHC 07/08/2014: 10 pLM, 30 dLAD, 75 oLCx,               30 mRCA, 40 RPDA, patent RCA stent --> med mgmt; d.) LHC               12/28/2021: 50 mRCA, 65 RPDA, 45 m-dRCA, 60 o-pRCA, 95               o-pLCx, 60 RI, 30 pLAD, 30 mLM --> med mgmt. 62/22/9798: Diastolic dysfunction     Comment:  a.) TTE 09/16/2017: ED 55-60%, triv AR/MR, G1DD 10/23/2017: Fracture of left ankle, closed, initial encounter No date: GERD (gastroesophageal reflux disease) No date: Glaucoma No date: Gout No date: Hepatic steatosis No date: Hypercholesteremia No date: Hypertension No date: Long term current use of antithrombotics/antiplatelets     Comment:  a.) cilostazol + full dose ASA +  clopidogrel No date: PAD (peripheral artery disease) ([place])     Comment:  a.) s/p multiple PTA procedures to lower extremities 07/13/2016: Pain in limb 09/16/2017: Syncope 01/12/2017: Unstable angina (HCC) No date: Vertigo  Past Surgical History: No date: APPENDECTOMY 10/26/2021: CATARACT EXTRACTION W/PHACO; Right     Comment:  Procedure: CATARACT EXTRACTION PHACO AND INTRAOCULAR               LENS PLACEMENT ([place]) RIGHT OMIDRIA  10.98 01:18.2;                Surgeon: [name], MD;  Location: [facility];  Service: Ophthalmology;  Laterality:               Right; 11/16/2021: CATARACT EXTRACTION W/PHACO; Left     Comment:  Procedure: CATARACT EXTRACTION PHACO AND INTRAOCULAR               LENS PLACEMENT ([place]) LEFT;  Surgeon: [name], MD;  Location: [facility];  Service:               Ophthalmology;  Laterality: Left;  6.91 0:46.0 03/30/2013: COLONOSCOPY     Comment:  [place] 12/10/2019: COLONOSCOPY WITH PROPOFOL; N/A     Comment:  Procedure: COLONOSCOPY WITH PROPOFOL;  Surgeon: [name], MD;  Location: [place];  Service:               Endoscopy;  Laterality: N/A; 01/18/2020: COLONOSCOPY WITH PROPOFOL; N/A     Comment:  Procedure: COLONOSCOPY WITH PROPOFOL;  Surgeon: [name], MD;  Location: [facility];  Service:               Endoscopy;  Laterality: N/A;  4 No date: CORONARY ANGIOPLASTY WITH STENT PLACEMENT; Left     Comment:  Procedure: CORONARY ANGIOPLASTY WITH STENT PLACEMENT 12/29/2020: LEFT HEART CATH AND CORONARY ANGIOGRAPHY; N/A     Comment:  Procedure: LEFT HEART CATH AND CORONARY ANGIOGRAPHY;                Surgeon: Corey Skains, MD;  Location: [place]               CV [facility];  Service: Cardiovascular;  Laterality: N/A; 06/19/2010: LEFT HEART CATH AND CORONARY ANGIOGRAPHY; Left     Comment:  Procedure: LEFT HEART CATH AND CORONARY  ANGIOGRAPHY;               Location: [facility]; Surgeon: Lowanda Foster, MD 07/08/2014: LEFT HEART CATH AND CORONARY ANGIOGRAPHY; Left     Comment:  Procedure: LEFT HEART CATH AND CORONARY ANGIOGRAPHY;               Location: [place]; Surgeon: Isaias Cowman, MD 09/25/2018: LOWER EXTREMITY ANGIOGRAPHY; Right     Comment:  Procedure: LOWER EXTREMITY ANGIOGRAPHY;  Surgeon: Algernon Huxley, MD;  Location: [employer] [facility] [employer] [facility];  Service:               Cardiovascular;  Laterality: Right; 03/17/2020: LOWER EXTREMITY ANGIOGRAPHY; Left     Comment:  Procedure: LOWER EXTREMITY ANGIOGRAPHY;  Surgeon: Algernon Huxley, MD;  Location: [place] [facility];  Service:               Cardiovascular;  Laterality: Left; 02/22/2022: LOWER EXTREMITY ANGIOGRAPHY; Right     Comment:  Procedure: Lower Extremity Angiography;  Surgeon: Algernon Huxley, MD;  Location: [facility];  Service:               Cardiovascular;  Laterality: Right; 01/18/2020: POLYPECTOMY; N/A     Comment:  Procedure: POLYPECTOMY;  Surgeon: [name], MD;                Location: [facility];  Service: Endoscopy;                Laterality: N/A;  BMI    Body Mass Index: 26.12 kg/m      Reproductive/Obstetrics negative OB ROS  Anesthesia Physical Anesthesia Plan  ASA: 3  Anesthesia Plan: General ETT   Post-op Pain Management:    Induction: Intravenous  PONV Risk Score and Plan: 2 and Ondansetron, Dexamethasone and Midazolam  Airway Management Planned: Oral ETT  Additional Equipment: Arterial line  Intra-op Plan:   Post-operative Plan: Extubation in OR and Possible Post-op intubation/ventilation  Informed Consent: I have reviewed the patients History and Physical, chart, labs and discussed the procedure including the risks, benefits and alternatives for the proposed anesthesia with the patient or authorized representative  who has indicated his/her understanding and acceptance.     Dental Advisory Given  Plan Discussed with: Anesthesiologist, CRNA and Surgeon  Anesthesia Plan Comments: (Patient consented for risks of anesthesia including but not limited to:  - adverse reactions to medications - damage to eyes, teeth, lips or other oral mucosa - nerve damage due to positioning  - sore throat or hoarseness - Damage to heart, brain, nerves, lungs, other parts of body or loss of life  Patient voiced understanding.)       Anesthesia Quick Evaluation

## 2022-04-11 NOTE — Op Note (Signed)
OPERATIVE NOTE   PROCEDURE: 1.   Left common femoral and superficial femoral artery endarterectomies and bovine pericardial patch angioplasty 2.  Separate left profunda femoris artery endarterectomy and separate bovine pericardial patch angioplasty 3.  Left lower extremity angiogram 4.  Stent placement x2 to the left proximal superficial femoral artery with a 6 mm diam 5 cm length Viabahn stent and an 8 mm diameter by 2.5 cm length Viabahn stent    PRE-OPERATIVE DIAGNOSIS: 1.Atherosclerotic occlusive disease left lower extremities with claudication   POST-OPERATIVE DIAGNOSIS: Same  SURGEON: [name], MD  CO-surgeon:  Hortencia Pilar, MD  ANESTHESIA:  general  ESTIMATED BLOOD LOSS: 300 cc  FINDING(S): 1.  significant plaque in left common femoral and superficial femoral arteries with stenosis in the proximal superficial femoral artery 2.  Significant plaque in the left profunda femoris artery requiring separate endarterectomy and patch angioplasty too far out to treat with eversion endarterectomy  SPECIMEN(S):  Left common femoral and superficial femoral artery plaque. Left profunda femoris plaque  INDICATIONS:    Patient presents with disabling claudication symptoms of the left leg.  Left femoral endarterectomy is planned to try to improve perfusion.  The risks and benefits as well as alternative therapies including intervention were reviewed in detail all questions were answered the patient agrees to proceed with surgery.  DESCRIPTION: After obtaining full informed written consent, the patient was brought back to the operating room and placed supine upon the operating table.  The patient received IV antibiotics prior to induction.  After obtaining adequate anesthesia, the patient was prepped and draped in the standard fashion appropriate time out is called.    Vertical incision was created overlying the left femoral arteries. The common femoral artery proximally, and  superficial femoral artery, and primary profunda femoris artery branches were encircled with vessel loops and prepared for control. The left femoral arteries were found to have significant plaque from the common femoral artery into the profunda and superficial femoral arteries.   6000 units of heparin was given and allowed circulate for 5 minutes.   Attention is then turned to the left femoral artery.  An arteriotomy is made with 11 blade and extended with Potts scissors in the common femoral artery and carried down onto the first 3-4 cm of the superficial femoral artery. An endarterectomy was then performed. [facility] was used to create a plane. The proximal endpoint was cut flush with tenotomy scissors. This was in the proximal common femoral artery. An eversion endarterectomy was then performed for the first 2 cm of the profunda femoris artery. The distal endpoint of the superficial femoral artery endarterectomy was created with gentle traction and the distal endpoint was tacked down with several 7-0 Prolene sutures.  The Bovine pericardial patcth is then selected and prepared for a patch angioplasty.  It is cut and beveled and started at the proximal endpoint with a 6-0 Prolene suture.  Approximately one half of the suture line is run medially and laterally and the distal end point was cut and bevelled to match the arteriotomy.  A second 6-0 Prolene was started at the distal end point and run to the mid portion to complete the arteriotomy.  The vessel was flushed prior to release of control and completion of the anastomosis.  At this point, flow was established first to the profunda femoris artery and then to the superficial femoral artery. Easily palpable pulses are noted well beyond the anastomosis and both arteries. Attention was then turned to the  endovascular portion of the procedure.  The patch was accessed with a Seldinger without difficulty.  A J-wire and 6 French sheath were placed was  later upsized to a 7 [place] sheath.  Imaging was performed through the sheath demonstrating a greater than 50% stenosis in the proximal SFA just distal to the patch angioplasty but the previously placed left SFA stent was patent without significant gnosis, popliteal artery was patent, and there was two-vessel runoff distally without focal stenosis beyond the proximal SFA stenosis.  After upsizing to a 7 [place] sheath, a 6 mm diameter by 5 cm length Viabahn stent was deployed in the proximal left SFA and then an 8 mm diameter by 2.5 cm length Viabahn stent was bridged up just to the distal endpoint and just into our patch angioplasty.  This was postdilated with a 5 mm balloon distally and a 7 mm balloon proximally.  Completion imaging showed the stent to be widely patent but on a steep LAO projection there was seem to be severe disease in the profunda femoris artery too far out to have been reached with the eversion endarterectomy and a separate endarterectomy and patch angioplasty will be required.  The sheath was removed with a 5-0 Prolene pursestring suture and 6-0 Prolene patch sutures were used on the patch angioplasty as needed for hemostasis. We then turned our attention to the profunda femoris artery.  This required further dissection fourth branch of the profunda femoris artery beyond the disease that was seen on angiography.  The distal profunda femoris artery was clamped with a profunda clamp and the side branches were controlled with Vesseloops.  The proximal control was created with a profunda clamp on the proximal profunda femoris artery taking care not to impede flow in the common femoral artery superficial femoral artery.  11 blade was used to create an anterior wall arteriotomy on the profunda femoris artery and this was opened with Potts scissors.  An endarterectomy was then performed with a freer elevator and very calcific plaque was removed from the profunda femoris artery out beyond the third  branch of the profunda femoris artery.  This was a very extensive endarterectomy and again could not have been done with an eversion endarterectomy.  All loose flecks were removed and the distal endpoint was clean.  The bovine pericardial patch was then brought onto the field and cut and beveled to match the arteriotomy.  A 6-0 Prolene was started the proximal endpoint and run half the length of suture line and then a second 6-0 Prolene was started at the distal endpoint and the arteriotomy was completed.  The vessel was flushed and tear prior to release of control.  6-0 Prolene patch sutures were used for hemostasis and hemostasis was achieved.  Surgicel and Vistacel topical hemostatic agents were placed in the femoral incision and hemostasis was complete.  Vancomycin crystals were placed for local antibiotics.  The femoral incision was then closed in a layered fashion with 2 layers of 2-0 Vicryl, 2 layers of 3-0 Vicryl, and staples with a Prevena VAC for the skin closure.   The patient was then awakened from anesthesia and taken to the recovery room in stable condition having tolerated the procedure well.  COMPLICATIONS: None  CONDITION: Stable     [name] 04/11/2022 12:45 PM   This note was created with Dragon Medical transcription system. Any errors in dictation are purely unintentional.

## 2022-04-11 NOTE — Anesthesia Procedure Notes (Signed)
Procedure Name: Intubation Date/Time: 04/11/2022 8:45 AM  Performed by: Esaw Grandchild, CRNAPre-anesthesia Checklist: Patient identified, Emergency Drugs available, Suction available and Patient being monitored Patient Re-evaluated:Patient Re-evaluated prior to induction Oxygen Delivery Method: Circle system utilized Preoxygenation: Pre-oxygenation with 100% oxygen Induction Type: IV induction Ventilation: Mask ventilation without difficulty Laryngoscope Size: Miller and 2 Grade View: Grade I Tube type: Oral Tube size: 8.0 mm Number of attempts: 1 Airway Equipment and Method: Stylet, Oral airway and Bite block Placement Confirmation: ETT inserted through vocal cords under direct vision, positive ETCO2 and breath sounds checked- equal and bilateral Secured at: 23 cm Tube secured with: Tape Dental Injury: Teeth and Oropharynx as per pre-operative assessment

## 2022-04-11 NOTE — Op Note (Signed)
OPERATIVE NOTE   PROCEDURE: Left common femoral and superficial femoral endarterectomy with bovine pericardial patch angioplasty. Left profunda femoris endarterectomy with bovine pericardial patch angioplasty. Open angioplasty and stent placement left superficial femoral artery  PRE-OPERATIVE DIAGNOSIS: Atherosclerotic occlusive disease left lower extremity with lifestyle limiting disabling claudication.  POST-OPERATIVE DIAGNOSIS: Same  CO-SURGEON: Katha Cabal, MD and Algernon Huxley, M.D.  ASSISTANT(S): None  ANESTHESIA: general  ESTIMATED BLOOD LOSS: 300 cc  FINDING(S): Profound calcific plaque noted of the left common femoral extending past the quaternary branches of the profunda femoris arteries as well as down the extensive length of the SFA  SPECIMEN(S):  Calcific plaque from the common femoral, superficial femoral and the profunda femoris artery  INDICATIONS:   Aaron [name] 66 y.o. y.o.male who presents with complaints of lifestyle limiting disabling claudication and pain continuously in the left lower extremity. The patient has documented severe atherosclerotic occlusive disease and has undergone minimally invasive treatments in the past. However, at this point his primary area of stricture stenosis resides in the common femoral and origins of the superficial femoral and profunda femoris extending into these arteries and therefore this is not amenable to intervention. The patient is therefore undergoing open endarterectomy. The risks and benefits of surgery have been reviewed with the patient, all questions have answered; alternative therapies have been reviewed as well and the patient has agreed to proceed with surgical open repair.  DESCRIPTION: After obtaining full informed written consent, the patient was brought back to the operating room and placed supine upon the operating table.  The patient received IV antibiotics  prior to induction.  After obtaining adequate anesthesia, the patient was prepped and draped in the standard fashion for left femoral exposure.  Co-surgeons are required because this is a complicated procedure with work being performed simultaneously from both the patient's right left sides.  This also expedites the procedure making a shorter operative time reducing complications and improving patient safety.  Attention was turned to the left groin with Dr. Lucky Cowboy working on the patient's left and myself working on the right of the patient.  Vertical  Incision was made over the left common femoral artery and dissection carried down to the common femoral artery with electrocautery.  I dissected out the common femoral artery from the distal external iliac artery (identified by the superficial circumflex vessels) down to the femoral bifurcation.  On initial inspection, the common femoral artery was: densely calcified and there was no palpable pulse noted.    Subsequently the dissection was continued to include all circumflex branches and the profunda femoral artery and superficial femoral artery. The superficial femoral artery was dissected circumferentially for a distance of approximately 3-4 cm and the profunda femoris was dissected circumferentially out to the fourth order branches individual vessel loops were placed around each branch.  Control of all branches was obtained with vessel loops.  A softer area in the distal external iliac artery amendable to clamping was identified.    The patient was given 6000 units of Heparin intravenously, which was a therapeutic bolus.   After waiting 3 minutes, the distal external iliac artery was clamped and all of the vessel loops were placed under tension.  Arteriotomy was made in the common femoral artery with a 11-blade and extended it with a Potts scissor proximally and distally extending the distal end down the SFA for approximately 2 cm.   Endarterectomy was then  performed under direct visualization using a freer elevator and a right  angle from the mid common femoral extending up both proximally and distally. Proximally the endarterectomy was brought up to the level of the clamp where a clean edge was obtained. Distally the endarterectomy was carried down to a soft spot in the SFA where a feathered edge would was obtained.  7-0 Prolene interrupted tacking sutures were placed to secure the leading edge of the plaque in the SFA.  The profunda femoris was initially treated with an eversion technique extending endarterectomy approximately 1-2 cm distally again obtaining what appeared to be a featheredge on the left.   At this point, a bovine pericardial patch was fashioned for the geometry of the arteriotomy.  The patch was sewn to the artery with 2 running stitches of 6-0 Prolene, running from each end.  Prior to completing the patch angioplasty, the profunda femoral artery was flushed as was the superficial femoral artery. The system was then forward flushed. The endarterectomy site was then irrigated copiously with heparinized saline. The patch angioplasty was completed in the usual fashion.  Flow was then reestablished first to the profunda femoris and then the superficial femoral artery. Any gaps or bleeding sites in the suture line were easily controlled with a 6-0 Prolene suture.   The SFA disease was then addressed with Dr. Lucky Cowboy is the primary and myself as an Environmental consultant.  The patch was then accessed with a Seldinger needle and a J-wire was advanced followed by placement of 6 French sheath.  Later the sheath was upsized to a 7 [place] sheath.  Initial imaging demonstrated greater than 70% stenosis in the proximal SFA.  Endarterectomy site appeared well treated but just distal to this was the location of the lesion.  The distal SFA and popliteal  demonstrated atherosclerotic changes but there are no hemodynamically significant lesions.  There was three-vessel runoff to  the foot.  A 6 mm x 50 mm Viabahn stent was then deployed across the lesion and postdilated with a 5 mm balloon.  Next a 8 mm x 25 mm Viabahn stent was deployed extending into the endarterectomy site across the distal head of the patch.  This was then dilated with a 7 mm balloon.  Follow-up imaging now demonstrated wide patency with preservation of distal runoff.  Less than 10% residual stenosis.  This angiography also demonstrated that the profunda was profoundly diseased greater than 80% that essentially began at the edge of the eversion endarterectomy down approximately 4 cm.  This necessitated a separate and distinct endarterectomy.  Having reestablish flow distally via the SFA we used a profunda clamp to occlude the profunda femoris at the origin and then a second clamp more distally.  A separate and distinct endarterectomy was then made with a limb blade extended with Potts scissors.  Endarterectomy was then performed under direct visualization with a freer elevator.  The calcific plaque was passed off as a second specimen as this was a completely separate endarterectomy.  Bovine pericardial patch was then delivered onto the field and sewn to repair the arteriotomy using 6-0 Prolene in a running fashion from proximal and distal.  Bleeding sites were controlled with 6-0 Prolene interrupted sutures.  The left groin was then irrigated copiously with sterile saline and subsequently Vistaseal and Surgicel were placed in the wound.  Powdered vancomycin was also applied to the wound the incision was repaired with a double layer of 2-0 Vicryl, a double layer of 3-0 Vicryl, and a layer of 4-0 Monocryl in a subcuticular fashion.  The skin was  closed with staples and then cleaned and dried Prevena VAC was then applied.  COMPLICATIONS: None  CONDITION: Aaron [name], M.D. [facility] Office: [phone]  04/11/2022, 12:47 PM

## 2022-04-11 NOTE — Progress Notes (Signed)
1340 Received from PACU via bed post operatively. Pulses in both legs are palpable ,feet are warm. Patient alert and oriented. Negative pressure wound vac. Present on left groin incision. 1446 Medicated for leg burning. 1506 Pain unrelieved. Given 3 more mgs. of Morphine. 1540 Patient asleep.

## 2022-04-12 ENCOUNTER — Encounter: Payer: Self-pay | Admitting: Vascular Surgery

## 2022-04-12 LAB — BASIC METABOLIC PANEL
Anion gap: 4 — ABNORMAL LOW (ref 5–15)
BUN: 17 mg/dL (ref 8–23)
CO2: 25 mmol/L (ref 22–32)
Calcium: 7.9 mg/dL — ABNORMAL LOW (ref 8.9–10.3)
Chloride: 104 mmol/L (ref 98–111)
Creatinine, Ser: 0.97 mg/dL (ref 0.61–1.24)
GFR, Estimated: >60 mL/min (ref >60)
Glucose, Bld: 126 mg/dL — ABNORMAL HIGH (ref 70–99)
Potassium: 3.9 mmol/L (ref 3.5–5.1)
Sodium: 133 mmol/L — ABNORMAL LOW (ref 135–145)

## 2022-04-12 LAB — CBC
HCT: 39.1 % (ref 39.0–52.0)
Hemoglobin: 13.1 g/dL (ref 13.0–17.0)
MCH: 32.1 pg (ref 26.0–34.0)
MCHC: 33.5 g/dL (ref 30.0–36.0)
MCV: 95.8 fL (ref 80.0–100.0)
Platelets: 204 10*3/uL (ref 150–400)
RBC: 4.08 MIL/uL — ABNORMAL LOW (ref 4.22–5.81)
RDW: 13.4 % (ref 11.5–15.5)
WBC: 9.8 10*3/uL (ref 4.0–10.5)
nRBC: 0 % (ref 0.0–0.2)

## 2022-04-12 LAB — SURGICAL PATHOLOGY

## 2022-04-12 NOTE — Anesthesia Postprocedure Evaluation (Signed)
Anesthesia Post Note  Patient: Aaron [name]  Procedure(s) Performed: ENDARTERECTOMY FEMORAL ( SFA & TIBIAL); insertion of SFA stent (Left) LOWER EXTREMITY ANGIOGRAM (Left) APPLICATION OF CELL SAVER  Patient location during evaluation: PACU Anesthesia Type: General Level of consciousness: awake and alert Pain management: pain level controlled Vital Signs Assessment: post-procedure vital signs reviewed and stable Respiratory status: spontaneous breathing, nonlabored ventilation, respiratory function stable and patient connected to nasal cannula oxygen Cardiovascular status: blood pressure returned to baseline and stable Postop Assessment: no apparent nausea or vomiting Anesthetic complications: no   No notable events documented.   Last Vitals:  Vitals:   04/12/22 0600 04/12/22 0700  BP: (!) 142/58 119/61  Pulse: 78 70  Resp: 11 11  Temp:    SpO2: 98% 92%    Last Pain:  Vitals:   04/12/22 0625  TempSrc:   PainSc: [place]

## 2022-04-12 NOTE — Evaluation (Addendum)
Physical Therapy Evaluation Patient Details Name: Aaron [name] MRN: [id number] DOB: 1956-01-27 Today's Date: 04/12/2022  History of Present Illness  admitted for acute hospitalization s/p angioplasty, endarterectomy with stent placement (10/4) (open to L LE groin, wound vac intact)  Clinical Impression  Patient seated in recliner upon arrival to room; alert and oriented, follows commands and agreeable to participation with session.  Endorses mild/mod pain to L groin (FACES 4-5/10), worse during transitional movements.  L hip/knee ROM generally guarded due to post-op pain, but functional for basic transfers and mobility tasks; otherwise, bilat UE/LE strength and ROM grossly WFL.  Able to complete sit/stand, basic transfers and gait (200') with RW, cga/close sup. Demonstrates reciprocal stepping pattern with fair step height/length; guarded L LE positioning/mechanics (limited hip flexion). However, good stability; slow, but steady, cadence. Mod WBing bilat LEs to offset LEs as needed.  Do recommend continued use of walker during acute recovery, but anticipate rapid weaning as pain resolves. Would benefit from skilled PT to address above deficits and promote optimal return to PLOF.; Recommend transition to HHPT upon discharge from acute hospitalization.     Durable Medical Equipment  (From admission, onward)           Start     Ordered   04/12/22 1116  For home use only DME Bedside commode  Once       Comments: Patient is not able to walk the distance required to go the bathroom, or he/she is unable to safely negotiate stairs required to access the bathroom.  A 3in1 BSC will alleviate this problem  Question:  Patient needs a bedside commode to treat with the following condition  Answer:  Difficulty walking   04/12/22 1116                   Recommendations for follow up therapy are one component of a multi-disciplinary discharge planning process, led by the attending physician.   Recommendations may be updated based on patient status, additional functional criteria and insurance authorization.  Follow Up Recommendations Home health PT      Assistance Recommended at Discharge PRN  Patient can return home with the following  A little help with walking and/or transfers;A little help with bathing/dressing/bathroom    Equipment Recommendations BSC/3in1  Recommendations for Other Services       Functional Status Assessment Patient has had a recent decline in their functional status and demonstrates the ability to make significant improvements in function in a reasonable and predictable amount of time.     Precautions / Restrictions Precautions Precautions: Fall Restrictions Weight Bearing Restrictions: No      Mobility  Bed Mobility Overal bed mobility: Needs Assistance Bed Mobility: Sit to Supine       Sit to supine: Supervision   General bed mobility comments: heavy use of momentum to elevate bilat LEs into bed    Transfers Overall transfer level: Needs assistance Equipment used: Rolling walker (2 wheels) Transfers: Sit to/from Stand Sit to Stand: Min guard, Supervision                Ambulation/Gait Ambulation/Gait assistance: Supervision, Min guard Gait Distance (Feet): 200 Feet Assistive device: Rolling walker (2 wheels)         General Gait Details: reciprocal stepping pattern with fair step height/length; guarded L LE positioning/mechanics (limited hip flexion). However, good stability; slow, but steady, cadence. Mod WBing bilat LEs to offset LEs as needed  Stairs  Wheelchair Mobility    Modified Rankin (Stroke Patients Only)       Balance Overall balance assessment: Needs assistance Sitting-balance support: No upper extremity supported, Feet supported Sitting balance-Leahy Scale: Good     Standing balance support: Bilateral upper extremity supported Standing balance-Leahy Scale: Good                                Pertinent Vitals/Pain Pain Assessment Pain Assessment: Faces Faces Pain Scale: Hurts little more Pain Location: L LE Pain Descriptors / Indicators: Aching, Guarding, Grimacing Pain Intervention(s): Limited activity within patient's tolerance, Monitored during session, Repositioned    Home Living Family/patient expects to be discharged to:: Private residence Living Arrangements: Alone Available Help at Discharge: Family;Available PRN/intermittently Type of Home: House Home Access: Stairs to enter   Entrance Stairs-Number of Steps: 1+1   Home Layout: One level Home Equipment: Conservation officer, nature (2 wheels);Cane - single point      Prior Function Prior Level of Function : Independent/Modified Independent             Mobility Comments: Indep with ADLs, household and community mobilization without assist device; denies fall history; active, enjoys walking (3x/day, 45 min/time)       Hand Dominance        Extremity/Trunk Assessment   Upper Extremity Assessment Upper Extremity Assessment: Overall WFL for tasks assessed    Lower Extremity Assessment Lower Extremity Assessment: Generalized weakness (L LE grossly 3-/5, limited by post-surgical pain (esp to L hip))       Communication   Communication: No difficulties  Cognition Arousal/Alertness: Awake/alert Behavior During Therapy: WFL for tasks assessed/performed Overall Cognitive Status: Within Functional Limits for tasks assessed                                          General Comments      Exercises     Assessment/Plan    PT Assessment Patient needs continued PT services  PT Problem List Decreased strength;Decreased range of motion;Decreased activity tolerance;Decreased balance;Decreased mobility;Decreased coordination;Decreased cognition;Decreased knowledge of use of DME;Decreased safety awareness;Cardiopulmonary status limiting activity       PT Treatment  Interventions DME instruction;Gait training;Stair training;Functional mobility training;Therapeutic activities;Therapeutic exercise;Balance training;Patient/family education    PT Goals (Current goals can be found in the Care Plan section)  Acute Rehab PT Goals Patient Stated Goal: to return home PT Goal Formulation: With patient Time For Goal Achievement: 04/26/22 Potential to Achieve Goals: Good    Frequency Min 2X/week     Co-evaluation               AM-PAC PT "6 Clicks" Mobility  Outcome Measure Help needed turning from your back to your side while in a flat bed without using bedrails?: None Help needed moving from lying on your back to sitting on the side of a flat bed without using bedrails?: A Little Help needed moving to and from a bed to a chair (including a wheelchair)?: A Little Help needed standing up from a chair using your arms (e.g., wheelchair or bedside chair)?: A Little Help needed to walk in hospital room?: A Little Help needed climbing 3-5 steps with a railing? : A Little 6 Click Score: 19    End of Session Equipment Utilized During Treatment: Gait belt Activity Tolerance: Patient tolerated treatment well Patient left:  in bed;with call bell/phone within reach;with nursing/sitter in room Nurse Communication: Mobility status PT Visit Diagnosis: Muscle weakness (generalized) (M62.81);Difficulty in walking, not elsewhere classified (R26.2)    Time: 1051-1105 PT Time Calculation (min) (ACUTE ONLY): 14 min   Charges:   PT Evaluation $PT Eval Moderate Complexity: 1 Mod          Orlander Norwood H. Owens Shark, PT, DPT, NCS 04/12/22, 11:18 AM [phone]

## 2022-04-12 NOTE — Progress Notes (Signed)
[facility]  Daily Progress Note   Subjective  -   Having incisional pain which is expected.  No major issues overnight.  Has walked the halls already.  Objective Vitals:   04/12/22 0600 04/12/22 0700 04/12/22 0800 04/12/22 0815  BP: (!) 142/58 119/61 133/68   Pulse: 78 70 69   Resp: '11 11 13   '$ Temp:    97.9 F (36.6 C)  TempSrc:    Oral  SpO2: 98% 92% 94%   Weight:      Height:        Intake/Output Summary (Last 24 hours) at 04/12/2022 1049 Last data filed at 04/12/2022 0800 Gross per 24 hour  Intake 2358.33 ml  Output 3025 ml  Net -666.67 ml    PULM  CTAB CV  RRR VASC  incisional VAC is intact.  2+ left dorsalis pedis and posterior tibial pulses  Laboratory CBC    Component Value Date/Time   WBC 9.8 04/12/2022 0027   HGB 13.1 04/12/2022 0027   HGB 16.3 04/06/2022 1049   HCT 39.1 04/12/2022 0027   HCT 47.4 04/06/2022 1049   PLT 204 04/12/2022 0027   PLT 287 04/06/2022 1049    BMET    Component Value Date/Time   NA 133 (L) 04/12/2022 0027   NA 137 04/06/2022 1049   NA 137 07/02/2014 0513   K 3.9 04/12/2022 0027   K 4.0 07/02/2014 0513   CL 104 04/12/2022 0027   CL 103 07/02/2014 0513   CO2 25 04/12/2022 0027   CO2 26 07/02/2014 0513   GLUCOSE 126 (H) 04/12/2022 0027   GLUCOSE 109 (H) 07/02/2014 0513   BUN 17 04/12/2022 0027   BUN 19 04/06/2022 1049   BUN 15 07/02/2014 0513   CREATININE 0.97 04/12/2022 0027   CREATININE 0.81 07/02/2014 0513   CALCIUM 7.9 (L) 04/12/2022 0027   CALCIUM 8.7 07/02/2014 0513   GFRNONAA >60 04/12/2022 0027   GFRNONAA >60 07/02/2014 0513   GFRNONAA >60 11/07/2012 0442   GFRAA >60 03/17/2020 0829   GFRAA >60 07/02/2014 0513   GFRAA >60 11/07/2012 0442    Assessment/Planning: POD # 1 from left femoral endarterectomy and SFA stent for disabling claudication symptoms  Doing well.  Has ambulated. Okay to go to the floor. Possibly discharge home tomorrow   [name]  04/12/2022, 10:49  AM

## 2022-04-12 NOTE — Evaluation (Signed)
Occupational Therapy Evaluation Patient Details Name: Aaron [name] MRN: [id number] DOB: 1956-07-03 Today's Date: 04/12/2022   History of Present Illness Aaron [name] 66 y/o male admitted for acute hospitalization s/p angioplasty, endarterectomy with stent placement (10/4) (open to L LE groin, wound vac intact)   Clinical Impression   Patient presenting with decreased independence self-care, functional mobility, safety, and endurance. Patient in bed upon arrival and agreeable to OT services. Patient reports he lives at home alone, 2 steps at entrance of home. Patient has a tub/shower, regular toilet, SPC, and RW. Patient reports he is independently at baseline. Patient currently functioning at min g/ supervision for bed mobility, transfers sit <> stand from bed and BSC. BSC recommended. Patient was able to ambulate ~24ft around nursing station using RW; reporting his pain got better with movement. Patients BP monitored during session. Patient was able to transfer to recliner with min G/ supervision using RW. Patient left in recliner with bed alarm in reach and all needs met.  Patient will benefit from acute OT to increase overall independence in the areas of ADLs, functional mobility, in order to safely discharge home.       Recommendations for follow up therapy are one component of a multi-disciplinary discharge planning process, led by the attending physician.  Recommendations may be updated based on patient status, additional functional criteria and insurance authorization.   Follow Up Recommendations  No OT follow up    Assistance Recommended at Discharge Intermittent Supervision/Assistance  Patient can return home with the following A little help with walking and/or transfers;A little help with bathing/dressing/bathroom;Assistance with cooking/housework;Assist for transportation;Help with stairs or ramp for entrance    Functional Status Assessment  Patient has had a recent decline in  their functional status and demonstrates the ability to make significant improvements in function in a reasonable and predictable amount of time.  Equipment Recommendations  BSC/3in1    Recommendations for Other Services       Precautions / Restrictions Precautions Precautions: Fall Restrictions Weight Bearing Restrictions: No      Mobility Bed Mobility Overal bed mobility: Needs Assistance Bed Mobility: Sit to Supine       Sit to supine: Independent        Transfers Overall transfer level: Needs assistance Equipment used: Rolling walker (2 wheels) Transfers: Sit to/from Stand Sit to Stand: Min guard, Supervision                  Balance Overall balance assessment: Needs assistance Sitting-balance support: No upper extremity supported, Feet supported Sitting balance-Leahy Scale: Good     Standing balance support: Bilateral upper extremity supported Standing balance-Leahy Scale: Good                             ADL either performed or assessed with clinical judgement   ADL Overall ADL's : Needs assistance/impaired                         Toilet Transfer: Min guard;Supervision/safety           Functional mobility during ADLs: Supervision/safety;Min guard;Rolling walker (2 wheels)        Pertinent Vitals/Pain Pain Assessment Faces Pain Scale: Hurts little more Pain Location: L LE Pain Descriptors / Indicators: Aching, Guarding, Grimacing Pain Intervention(s): Premedicated before session, Limited activity within patient's tolerance, Monitored during session, Repositioned     Hand Dominance     Extremity/Trunk Assessment  Upper Extremity Assessment Upper Extremity Assessment: Overall WFL for tasks assessed   Lower Extremity Assessment Lower Extremity Assessment: Generalized weakness       Communication Communication Communication: No difficulties   Cognition Arousal/Alertness: Awake/alert Behavior During Therapy:  WFL for tasks assessed/performed Overall Cognitive Status: Within Functional Limits for tasks assessed                                                  Home Living Family/patient expects to be discharged to:: Private residence Living Arrangements: Alone Available Help at Discharge: Family;Available PRN/intermittently Type of Home: House Home Access: Stairs to enter Entrance Stairs-Number of Steps: 1+1 Entrance Stairs-Rails: None Home Layout: One level     Bathroom Shower/Tub: Teacher, early years/pre: Standard     Home Equipment: Conservation officer, nature (2 wheels);Cane - single point          Prior Functioning/Environment Prior Level of Function : Independent/Modified Independent             Mobility Comments: Indep with ADLs, household and community mobilization without assist device; denies fall history; active, enjoys walking (3x/day, 45 min/time)          OT Problem List: Decreased strength;Pain;Decreased safety awareness;Decreased activity tolerance      OT Treatment/Interventions: Self-care/ADL training;Therapeutic exercise;Therapeutic activities;DME and/or AE instruction    OT Goals(Current goals can be found in the care plan section) Acute Rehab OT Goals Patient Stated Goal: to return home OT Goal Formulation: With patient Time For Goal Achievement: 04/26/22 Potential to Achieve Goals: Good ADL Goals Pt Will Perform Grooming: Independently Pt Will Perform Lower Body Bathing: with modified independence Pt Will Perform Lower Body Dressing: with modified independence Pt Will Transfer to Toilet: with modified independence Pt Will Perform Toileting - Clothing Manipulation and hygiene: with modified independence  OT Frequency: Min 2X/week       AM-PAC OT "6 Clicks" Daily Activity     Outcome Measure Help from another person eating meals?: None Help from another person taking care of personal grooming?: A Little Help from another  person toileting, which includes using toliet, bedpan, or urinal?: A Little Help from another person bathing (including washing, rinsing, drying)?: A Little Help from another person to put on and taking off regular upper body clothing?: None Help from another person to put on and taking off regular lower body clothing?: A Lot 6 Click Score: 19   End of Session Equipment Utilized During Treatment: Gait belt;Rolling walker (2 wheels) Nurse Communication: Mobility status  Activity Tolerance: Patient tolerated treatment well;Patient limited by pain Patient left: in chair;with call bell/phone within reach  OT Visit Diagnosis: Unsteadiness on feet (R26.81);Muscle weakness (generalized) (M62.81)                Time: 4259-5638 OT Time Calculation (min): 37 min Charges:  OT General Charges $OT Visit: 1 Visit OT Evaluation $OT Eval Low Complexity: 1 Low OT Treatments $Self Care/Home Management : 8-22 mins    Aydrian Halpin, OTS 04/12/2022, 1:40 PM

## 2022-04-13 NOTE — Progress Notes (Signed)
Report called to receiving nurse on [place]. Pt aware.

## 2022-04-13 NOTE — Progress Notes (Signed)
[facility]  Daily Progress Note   Subjective  -   Doing well.  Walking with PT but does not feel stable  Objective Vitals:   04/13/22 0500 04/13/22 0600 04/13/22 0700 04/13/22 0800  BP: (!) 151/71 (!) 160/65 138/79 (!) 139/95  Pulse: 64 76 73 84  Resp: '13 18 18 18  '$ Temp:    98.1 F (36.7 C)  TempSrc:    Oral  SpO2: 94% 94% 94% 93%  Weight:      Height:        Intake/Output Summary (Last 24 hours) at 04/13/2022 0821 Last data filed at 04/13/2022 0758 Gross per 24 hour  Intake 629.65 ml  Output 600 ml  Net 29.65 ml    PULM  CTAB CV  RRR VASC  Palpable left PT and DP pulses  Laboratory CBC    Component Value Date/Time   WBC 9.8 04/12/2022 0027   HGB 13.1 04/12/2022 0027   HGB 16.3 04/06/2022 1049   HCT 39.1 04/12/2022 0027   HCT 47.4 04/06/2022 1049   PLT 204 04/12/2022 0027   PLT 287 04/06/2022 1049    BMET    Component Value Date/Time   NA 133 (L) 04/12/2022 0027   NA 137 04/06/2022 1049   NA 137 07/02/2014 0513   K 3.9 04/12/2022 0027   K 4.0 07/02/2014 0513   CL 104 04/12/2022 0027   CL 103 07/02/2014 0513   CO2 25 04/12/2022 0027   CO2 26 07/02/2014 0513   GLUCOSE 126 (H) 04/12/2022 0027   GLUCOSE 109 (H) 07/02/2014 0513   BUN 17 04/12/2022 0027   BUN 19 04/06/2022 1049   BUN 15 07/02/2014 0513   CREATININE 0.97 04/12/2022 0027   CREATININE 0.81 07/02/2014 0513   CALCIUM 7.9 (L) 04/12/2022 0027   CALCIUM 8.7 07/02/2014 0513   GFRNONAA >60 04/12/2022 0027   GFRNONAA >60 07/02/2014 0513   GFRNONAA >60 11/07/2012 0442   GFRAA >60 03/17/2020 0829   GFRAA >60 07/02/2014 0513   GFRAA >60 11/07/2012 0442    Assessment/Planning: POD #2 s/p left femoral endarterectomy for PAD with claudication  Doing well Does not feel mobile enough to go home safely today Another day or two working with PT and plan discharge this weekend [facility] to go to the floor   [place] Mabel Roll  04/13/2022, 8:21 AM

## 2022-04-13 NOTE — TOC Initial Note (Addendum)
Transition of Care [facility]) - Initial/Assessment Note    Patient Details  Name: Aaron [name] MRN: [id number] Date of Birth: August 27, 1955  Transition of [facility]) CM/SW Contact:    Magnus Ivan, LCSW Phone Number: 04/13/2022, 10:23 AM  Clinical Narrative:                 CSW met with patient at bedside. Patient lives alone. States he has family who can check on him at times but no one that can help routinely.  PCP is Dr. Army Melia. Pharmacy is [facility], also uses a Psychologist, clinical.  Patient has a RW and cane at home.  Patient's brother or friend provide transportation and will drive him home when discharged. Patient is agreeable to PT recs for HHPT and 3in1. 3in1 ordered through [facility] with [employer], notified her of possible DC this weekend. [facility] referral made and accepted by [facility] with Alvis Lemmings, notified him of possible DC this weekend.   12:21- Asked MD/PT to add note to 3in1 order per [employer] request.   Expected Discharge Plan: [place] Barriers to Discharge: Continued Medical Work up   Patient Goals and CMS Choice Patient states their goals for this hospitalization and ongoing recovery are:: home with home health CMS Medicare.gov Compare Post Acute Care list provided to:: Patient Choice offered to / list presented to : Patient  Expected Discharge Plan and Services Expected Discharge Plan: [place]       Living arrangements for the past 2 months: Single Family Home                 DME Arranged: 3-N-1 DME Agency: [employer] Date DME Agency Contacted: 04/13/22   Representative spoke with at DME Agency: [name] HH Arranged: PT       Representative spoke with at [place]: tbd  Prior Living Arrangements/Services Living arrangements for the past 2 months: Single Family Home Lives with:: Self Patient language and need for interpreter reviewed:: Yes Do you feel safe going back to the place where you live?: Yes      Need for Family  Participation in Patient Care: Yes (Comment) Care giver support system in place?: Yes (comment) Current home services: DME Criminal Activity/Legal Involvement Pertinent to Current Situation/Hospitalization: No - Comment as needed  Activities of Daily Living Home Assistive Devices/Equipment: None ADL Screening (condition at time of admission) Patient's cognitive ability adequate to safely complete daily activities?: Yes Is the patient deaf or have difficulty hearing?: No Does the patient have difficulty seeing, even when wearing glasses/contacts?: No Does the patient have difficulty concentrating, remembering, or making decisions?: No Patient able to express need for assistance with ADLs?: Yes Does the patient have difficulty dressing or bathing?: No Independently performs ADLs?: Yes (appropriate for developmental age) Does the patient have difficulty walking or climbing stairs?: No Weakness of Legs: Both Weakness of Arms/Hands: None  Permission Sought/Granted Permission sought to share information with : Chartered certified accountant granted to share information with : Yes, Verbal Permission Granted     Permission granted to share info w AGENCY: [place] and DME agencies        Emotional Assessment       Orientation: : Oriented to Self, Oriented to Place, Oriented to  Time, Oriented to Situation Alcohol / Substance Use: Not Applicable Psych Involvement: No (comment)  Admission diagnosis:  Atherosclerosis of artery of extremity with intermittent claudication ([place]) [I70.219] Patient Active Problem List   Diagnosis Date Noted  Atherosclerosis of artery of extremity with intermittent claudication ([place]) 04/11/2022   Hepatic steatosis 01/04/2022   Aortic atherosclerosis ([place]) 07/10/2020   Stage 2 moderate COPD by GOLD classification ([place]) 05/16/2020   Dyspepsia and disorder of function of stomach 05/16/2020   Cervical disc disorder of mid-cervical region 05/10/2020    Polyp of descending colon    Mixed hyperlipidemia 10/28/2018   Atherosclerosis of native arteries of extremity with intermittent claudication ([place]) 07/13/2016   Tobacco use disorder 07/13/2016   Prediabetes 07/27/2015   Controlled gout 12/22/2014   Coronary artery disease involving native coronary artery of native heart with angina pectoris ([place]) 10/09/2014   Essential (primary) hypertension 10/09/2014   Dupuytren's disease of palm 10/09/2014   Psoriasis 10/09/2014   H/O cardiac catheterization 01/27/2014   Peripheral vascular disease ([place]) 01/27/2014   PCP:  Glean Hess, MD Pharmacy:   [facility], [place] [address] [place] [address] Phone: [phone] Fax: [phone]  [facility] ([place], [place] [address] Phone: [phone] Fax: [phone]     Social Determinants of Health (SDOH) Interventions    Readmission Risk Interventions     No data to display

## 2022-04-13 NOTE — Progress Notes (Signed)
Occupational Therapy Treatment Patient Details Name: Aaron [name] MRN: [id number] DOB: April 13, 1956 Today's Date: 04/13/2022   History of present illness Aaron [name] 66 y/o male admitted for acute hospitalization s/p angioplasty, endarterectomy with stent placement (10/4) (open to L LE groin, wound vac intact)   OT comments  Patient in bed upon arrival and agreeable to [facility] treatment. Patient was able to perform bed mobility independently. Patient was educated on LB dressing and was able to perform LB dressing tasks and transfers with supervision. Patient declined ambulating to recliner stating "it's uncomfortable." Patient left in bed with bed alarm set, call bell in reach, and all needs met.    Recommendations for follow up therapy are one component of a multi-disciplinary discharge planning process, led by the attending physician.  Recommendations may be updated based on patient status, additional functional criteria and insurance authorization.    Follow Up Recommendations  No OT follow up    Assistance Recommended at Discharge Intermittent Supervision/Assistance  Patient can return home with the following  A little help with walking and/or transfers;A little help with bathing/dressing/bathroom;Assistance with cooking/housework;Assist for transportation;Help with stairs or ramp for entrance   Equipment Recommendations  BSC/3in1    Recommendations for Other Services      Precautions / Restrictions Precautions Precautions: Fall Restrictions Weight Bearing Restrictions: No       Mobility Bed Mobility Overal bed mobility: Independent Bed Mobility: Supine to Sit, Sit to Supine     Supine to sit: Independent Sit to supine: Independent        Transfers   Equipment used: Rolling walker (2 wheels) Transfers: Sit to/from Stand Sit to Stand: Supervision                 Balance Overall balance assessment: Modified Independent Sitting-balance support: No upper  extremity supported, Feet supported Sitting balance-Leahy Scale: Good     Standing balance support: Bilateral upper extremity supported Standing balance-Leahy Scale: Good                             ADL either performed or assessed with clinical judgement   ADL                       Lower Body Dressing: Supervision/safety;Sit to/from stand                      Extremity/Trunk Assessment Upper Extremity Assessment Upper Extremity Assessment: Overall WFL for tasks assessed   Lower Extremity Assessment Lower Extremity Assessment: Generalized weakness         Cognition Arousal/Alertness: Awake/alert Behavior During Therapy: WFL for tasks assessed/performed Overall Cognitive Status: Within Functional Limits for tasks assessed                                                     Pertinent Vitals/ Pain       Pain Assessment Pain Assessment: No/denies pain Pain Location: Premedicated before session.   Frequency  Min 2X/week        Progress Toward Goals  OT Goals(current goals can now be found in the care plan section)  Progress towards OT goals: Progressing toward goals  Acute Rehab OT Goals Patient Stated Goal: to return home. OT Goal Formulation: With patient Time For Goal  Achievement: 04/26/22 Potential to Achieve Goals: Good  Plan Discharge plan remains appropriate       AM-PAC OT "6 Clicks" Daily Activity     Outcome Measure   Help from another person eating meals?: None Help from another person taking care of personal grooming?: A Little Help from another person toileting, which includes using toliet, bedpan, or urinal?: A Little Help from another person bathing (including washing, rinsing, drying)?: A Little Help from another person to put on and taking off regular upper body clothing?: None Help from another person to put on and taking off regular lower body clothing?: A Little 6 Click Score: 20     End of Session Equipment Utilized During Treatment: Gait belt;Rolling walker (2 wheels)  OT Visit Diagnosis: Unsteadiness on feet (R26.81);Muscle weakness (generalized) (M62.81)   Activity Tolerance Patient tolerated treatment well   Patient Left in bed;with call bell/phone within reach;with bed alarm set   Nurse Communication Mobility status        Time: 0177-9390 OT Time Calculation (min): 11 min  Charges:      Tomasa Blase, OTS 04/13/2022, 11:24 AM

## 2022-04-13 NOTE — Progress Notes (Signed)
PT Cancellation Note  Patient Details Name: Aaron [name] MRN: [id number] DOB: Jun 12, 1956   Cancelled Treatment:     PT attempt. Pt c/o pain and just given pain medication. Per pt/RN, has been standing to use urinal independently throughout the day. Politely requested holding PT at  this time. Acute therapy will continue to follow per current POC.    Willette Pa 04/13/2022, 3:48 PM

## 2022-04-14 NOTE — Progress Notes (Signed)
Physical Therapy Treatment Patient Details Name: Aaron [name] MRN: [id number] DOB: 11/10/1955 Today's Date: 04/14/2022   History of Present Illness Aaron [name] 66 y/o male admitted for acute hospitalization s/p angioplasty, endarterectomy with stent placement (10/4) (open to L LE groin, wound vac intact)    PT Comments    Pt is progressing steadily with confidence with mobility and toward greater independence.  Pt continues to guard movement with LLE and move slowly but demonstrates increased balance with functional standing reaching and upper trunk rotation with only intermittent UE support.  Current PT plan for d/c is appropriate.  Recommendations for follow up therapy are one component of a multi-disciplinary discharge planning process, led by the attending physician.  Recommendations may be updated based on patient status, additional functional criteria and insurance authorization.  Follow Up Recommendations  Home health PT     Assistance Recommended at Discharge PRN  Patient can return home with the following A little help with walking and/or transfers;A little help with bathing/dressing/bathroom   Equipment Recommendations  BSC/3in1    Recommendations for Other Services       Precautions / Restrictions Precautions Precautions: Fall Restrictions Weight Bearing Restrictions: No     Mobility  Bed Mobility Overal bed mobility: Independent Bed Mobility: Supine to Sit, Sit to Supine     Supine to sit: Independent Sit to supine: Independent   General bed mobility comments: heavy use of momentum to elevate bilat LEs into bed    Transfers Overall transfer level: Needs assistance Equipment used: Rolling walker (2 wheels) Transfers: Sit to/from Stand Sit to Stand: Supervision                Ambulation/Gait Ambulation/Gait assistance: Supervision, Min guard Gait Distance (Feet): 150 Feet Assistive device: Rolling walker (2 wheels) Gait  Pattern/deviations: Step-through pattern       General Gait Details: reciprocal stepping pattern with fair step height/length; guarded L LE positioning/mechanics (limited hip flexion). However, good stability; slow, but steady, cadence. Mod WBing bilat LEs to offset LEs as needed   Stairs             Wheelchair Mobility    Modified Rankin (Stroke Patients Only)       Balance Overall balance assessment: Modified Independent Sitting-balance support: No upper extremity supported, Feet supported Sitting balance-Leahy Scale: Good     Standing balance support: Bilateral upper extremity supported Standing balance-Leahy Scale: Good                              Cognition Arousal/Alertness: Awake/alert Behavior During Therapy: WFL for tasks assessed/performed Overall Cognitive Status: Within Functional Limits for tasks assessed                                          Exercises Other Exercises Other Exercises: Standing activites to increase tolerance to movment and for balance: alt UE raises (stretching lateral trunk), upper trunk rotaions with head turns, alt reaching forward outside of BOS, All decreasing from little to no UE support.  Alt. mini marches with bil. UE support.    General Comments        Pertinent Vitals/Pain Pain Assessment Pain Score: 7  Faces Pain Scale: Hurts little more Pain Location: L groin Pain Descriptors / Indicators: Aching, Guarding, Grimacing Pain Intervention(s): Limited activity within patient's tolerance, Monitored during session  Home Living                          Prior Function            PT Goals (current goals can now be found in the care plan section) Acute Rehab PT Goals Patient Stated Goal: to return home PT Goal Formulation: With patient Time For Goal Achievement: 04/26/22 Potential to Achieve Goals: Good Progress towards PT goals: Progressing toward goals    Frequency     Min 2X/week      PT Plan      Co-evaluation              AM-PAC PT "6 Clicks" Mobility   Outcome Measure  Help needed turning from your back to your side while in a flat bed without using bedrails?: None Help needed moving from lying on your back to sitting on the side of a flat bed without using bedrails?: None Help needed moving to and from a bed to a chair (including a wheelchair)?: None Help needed standing up from a chair using your arms (e.g., wheelchair or bedside chair)?: None Help needed to walk in hospital room?: A Little Help needed climbing 3-5 steps with a railing? : A Little 6 Click Score: 22    End of Session Equipment Utilized During Treatment: Gait belt Activity Tolerance: Patient tolerated treatment well Patient left: in bed;with call bell/phone within reach;with nursing/sitter in room Nurse Communication: Mobility status PT Visit Diagnosis: Muscle weakness (generalized) (M62.81);Difficulty in walking, not elsewhere classified (R26.2)     Time: 4709-6283 PT Time Calculation (min) (ACUTE ONLY): 16 min  Charges:  $Therapeutic Activity: 8-22 mins                     Bjorn Loser, PTA  04/14/22, 12:06 PM

## 2022-04-14 NOTE — Progress Notes (Signed)
3 Days Post-Op   Subjective/Chief Complaint: Doing OK. Still unstable while ambulating- using walker. Pain controlled with current regimen   Objective: Vital signs in last 24 hours: Temp:  [97.4 F (36.3 C)-98.2 F (36.8 C)] 98.2 F (36.8 C) (10/07 0415) Pulse Rate:  [68-84] 69 (10/07 0415) Resp:  [12-23] 18 (10/07 0415) BP: (111-157)/(50-95) 134/72 (10/07 0415) SpO2:  [92 %-98 %] 98 % (10/07 0415) Last BM Date : 04/11/22  Intake/Output from previous day: 10/06 0701 - 10/07 0700 In: -  Out: 850 [Urine:850] Intake/Output this shift: No intake/output data recorded.  General appearance: alert and no distress Extremities: LEFT lower extremity- warm Prevena in place- soft, +DP/+PT  Lab Results:  Recent Labs    04/12/22 0027  WBC 9.8  HGB 13.1  HCT 39.1  PLT 204   BMET Recent Labs    04/12/22 0027  NA 133*  K 3.9  CL 104  CO2 25  GLUCOSE 126*  BUN 17  CREATININE 0.97  CALCIUM 7.9*   PT/INR No results for input(s): "LABPROT", "INR" in the last 72 hours. ABG No results for input(s): "PHART", "HCO3" in the last 72 hours.  Invalid input(s): "PCO2", "PO2"  Studies/Results: No results found.  Anti-infectives: Anti-infectives (From admission, onward)    Start     Dose/Rate Route Frequency Ordered Stop   04/11/22 1600  ceFAZolin (ANCEF) IVPB 2g/100 mL premix        2 g 200 mL/hr over 30 Minutes Intravenous Every 8 hours 04/11/22 1403 04/12/22 0024   04/11/22 1228  vancomycin (VANCOCIN) powder  Status:  Discontinued          As needed 04/11/22 1229 04/11/22 1254   04/11/22 0712  ceFAZolin (ANCEF) 2-4 GM/100ML-% IVPB       Note to Pharmacy: Sylvester Harder P: cabinet override      04/11/22 0712 04/11/22 0946   04/11/22 0600  ceFAZolin (ANCEF) IVPB 2g/100 mL premix        2 g 200 mL/hr over 30 Minutes Intravenous On call to O.R. 04/11/22 0118 04/11/22 0915       Assessment/Plan: s/p  POD #3 s/p left femoral endarterectomy for PAD with claudication   Continue OOB with PT over weekend Discharge home likely on Monday with Home PT  LOS: 3 days    Jamesetta So A 04/14/2022

## 2022-04-15 LAB — CBC
HCT: 39.3 % (ref 39.0–52.0)
Hemoglobin: 12.9 g/dL — ABNORMAL LOW (ref 13.0–17.0)
MCH: 31.9 pg (ref 26.0–34.0)
MCHC: 32.8 g/dL (ref 30.0–36.0)
MCV: 97 fL (ref 80.0–100.0)
Platelets: 222 10*3/uL (ref 150–400)
RBC: 4.05 MIL/uL — ABNORMAL LOW (ref 4.22–5.81)
RDW: 13.3 % (ref 11.5–15.5)
WBC: 7.2 10*3/uL (ref 4.0–10.5)
nRBC: 0 % (ref 0.0–0.2)

## 2022-04-15 LAB — BASIC METABOLIC PANEL
Anion gap: 7 (ref 5–15)
BUN: 18 mg/dL (ref 8–23)
CO2: 30 mmol/L (ref 22–32)
Calcium: 8.8 mg/dL — ABNORMAL LOW (ref 8.9–10.3)
Chloride: 99 mmol/L (ref 98–111)
Creatinine, Ser: 0.93 mg/dL (ref 0.61–1.24)
GFR, Estimated: >60 mL/min (ref >60)
Glucose, Bld: 132 mg/dL — ABNORMAL HIGH (ref 70–99)
Potassium: 3.8 mmol/L (ref 3.5–5.1)
Sodium: 136 mmol/L (ref 135–145)

## 2022-04-15 NOTE — Progress Notes (Signed)
4 Days Post-Op   Subjective/Chief Complaint: Doing much better. Still complains of pain at incision site-controlled with current regimen. Ambulating with PT   Objective: Vital signs in last 24 hours: Temp:  [97.7 F (36.5 C)-98.3 F (36.8 C)] 98.3 F (36.8 C) (10/08 0731) Pulse Rate:  [69-80] 72 (10/08 0731) Resp:  [16-18] 16 (10/08 0731) BP: (124-146)/(56-75) 134/75 (10/08 0731) SpO2:  [95 %-97 %] 97 % (10/08 0731) Last BM Date : 04/11/22  Intake/Output from previous day: 10/07 0701 - 10/08 0700 In: 360 [P.O.:360] Out: 250 [Urine:250] Intake/Output this shift: Total I/O In: 240 [P.O.:240] Out: -   General appearance: alert and no distress Cardio: regular rate and rhythm Extremities: warm, LEFT groin Prevena in place, +DP/+PT  Lab Results:  No results for input(s): "WBC", "HGB", "HCT", "PLT" in the last 72 hours. BMET No results for input(s): "NA", "K", "CL", "CO2", "GLUCOSE", "BUN", "CREATININE", "CALCIUM" in the last 72 hours. PT/INR No results for input(s): "LABPROT", "INR" in the last 72 hours. ABG No results for input(s): "PHART", "HCO3" in the last 72 hours.  Invalid input(s): "PCO2", "PO2"  Studies/Results: No results found.  Anti-infectives: Anti-infectives (From admission, onward)    Start     Dose/Rate Route Frequency Ordered Stop   04/11/22 1600  ceFAZolin (ANCEF) IVPB 2g/100 mL premix        2 g 200 mL/hr over 30 Minutes Intravenous Every 8 hours 04/11/22 1403 04/12/22 0024   04/11/22 1228  vancomycin (VANCOCIN) powder  Status:  Discontinued          As needed 04/11/22 1229 04/11/22 1254   04/11/22 0712  ceFAZolin (ANCEF) 2-4 GM/100ML-% IVPB       Note to Pharmacy: Sylvester Harder P: cabinet override      04/11/22 0712 04/11/22 0946   04/11/22 0600  ceFAZolin (ANCEF) IVPB 2g/100 mL premix        2 g 200 mL/hr over 30 Minutes Intravenous On call to O.R. 04/11/22 0118 04/11/22 0915       Assessment/Plan: POD #4 s/p left femoral endarterectomy   Continue OOB with PT Discharge home on Monday with Home PT  LOS: 4 days    Jamesetta So A 04/15/2022

## 2022-04-16 MED ORDER — FLEET ENEMA 7-19 GM/118ML RE ENEM
1.0000 | ENEMA | Freq: Once | RECTAL | Status: AC | PRN
  Administered 2022-04-16: 1 via RECTAL

## 2022-04-16 MED ORDER — DOCUSATE SODIUM 100 MG PO CAPS: 100.0000 mg | ORAL_CAPSULE | Freq: Every day | ORAL | 0 refills | Status: DC

## 2022-04-16 MED ORDER — SENNOSIDES-DOCUSATE SODIUM 8.6-50 MG PO TABS: 1.0000 | ORAL_TABLET | Freq: Every evening | ORAL | 0 refills | Status: DC | PRN

## 2022-04-16 MED ORDER — BISACODYL 10 MG RE SUPP
10.0000 mg | Freq: Once | RECTAL | Status: AC
  Administered 2022-04-16: 10 mg via RECTAL
  Filled 2022-04-16: qty 1

## 2022-04-16 MED ORDER — OXYCODONE-ACETAMINOPHEN 5-325 MG PO TABS: 1.0000 | ORAL_TABLET | Freq: Four times a day (QID) | ORAL | 0 refills | Status: DC | PRN

## 2022-04-16 NOTE — Progress Notes (Signed)
Physical Therapy Treatment Patient Details Name: Aaron [name] MRN: [id number] DOB: 1955-09-21 Today's Date: 04/16/2022   History of Present Illness Aaron [name] 66 y/o male admitted for acute hospitalization s/p angioplasty, endarterectomy with stent placement (10/4) (open to L LE groin, wound vac intact)    PT Comments    Patient up in bathroom at start of session, did endorse some mild groin pain with mobility. He was able to sit and don shorts without assistance, cued for technique. Sit <> stand without RW supervision, but pt did prefer to utilize RW for ambulation in hallway, 16f. No LOB, but did display some antalgic gait and off weighting of LLE. Returned to room with all needs in reach. The patient would benefit from further skilled PT intervention to continue to progress towards goals. Recommendation remains appropriate.       Recommendations for follow up therapy are one component of a multi-disciplinary discharge planning process, led by the attending physician.  Recommendations may be updated based on patient status, additional functional criteria and insurance authorization.  Follow Up Recommendations  Home health PT     Assistance Recommended at Discharge PRN  Patient can return home with the following Assist for transportation;Help with stairs or ramp for entrance   Equipment Recommendations  BSC/3in1    Recommendations for Other Services       Precautions / Restrictions Precautions Precautions: Fall Restrictions Weight Bearing Restrictions: No     Mobility  Bed Mobility               General bed mobility comments: pt up in bathroom at start of session    Transfers Overall transfer level: Needs assistance Equipment used: Rolling walker (2 wheels) Transfers: Sit to/from Stand Sit to Stand: Supervision                Ambulation/Gait Ambulation/Gait assistance: Supervision Gait Distance (Feet): 190 Feet Assistive device: Rolling  walker (2 wheels) Gait Pattern/deviations: Step-through pattern       General Gait Details: some antalgic gait noted   Stairs             Wheelchair Mobility    Modified Rankin (Stroke Patients Only)       Balance Overall balance assessment: Modified Independent         Standing balance support: No upper extremity supported, During functional activity Standing balance-Leahy Scale: Good                              Cognition Arousal/Alertness: Awake/alert Behavior During Therapy: WFL for tasks assessed/performed Overall Cognitive Status: Within Functional Limits for tasks assessed                                          Exercises      General Comments        Pertinent Vitals/Pain Pain Assessment Pain Assessment: Faces Faces Pain Scale: Hurts a little bit Pain Location: L groin Pain Descriptors / Indicators: Aching, Guarding, Grimacing Pain Intervention(s): Limited activity within patient's tolerance, Monitored during session, Repositioned    Home Living                          Prior Function            PT Goals (current goals can now be found in  the care plan section) Progress towards PT goals: Progressing toward goals    Frequency    Min 2X/week      PT Plan Current plan remains appropriate    Co-evaluation              AM-PAC PT "6 Clicks" Mobility   Outcome Measure  Help needed turning from your back to your side while in a flat bed without using bedrails?: None Help needed moving from lying on your back to sitting on the side of a flat bed without using bedrails?: None Help needed moving to and from a bed to a chair (including a wheelchair)?: None Help needed standing up from a chair using your arms (e.g., wheelchair or bedside chair)?: None Help needed to walk in hospital room?: None Help needed climbing 3-5 steps with a railing? : A Little 6 Click Score: 23    End of Session  Equipment Utilized During Treatment: Gait belt Activity Tolerance: Patient tolerated treatment well Patient left: with call bell/phone within reach;in chair Nurse Communication: Mobility status PT Visit Diagnosis: Muscle weakness (generalized) (M62.81);Difficulty in walking, not elsewhere classified (R26.2)     Time: 2395-3202 PT Time Calculation (min) (ACUTE ONLY): 10 min  Charges:  $Therapeutic Activity: 8-22 mins                     Lieutenant Diego PT, DPT 2:46 PM,04/16/22

## 2022-04-16 NOTE — TOC Transition Note (Signed)
Transition of Care [facility]) - CM/SW Discharge Note   Patient Details  Name: Aaron [name] MRN: [id number] Date of Birth: 1955-07-13  Transition of Care [facility]) CM/SW Contact:  Beverly Sessions, RN Phone Number: 04/16/2022, 3:24 PM   Clinical Narrative:     Per NP plan to dc today Per toc previous conversation with [facility] brother or friend to transport [facility] was delivered to room last week.  [name] with [facility] notified. He states that PT can remove prevena.  NP updated     Barriers to Discharge: Continued Medical Work up   Patient Goals and CMS Choice Patient states their goals for this hospitalization and ongoing recovery are:: home with home health CMS Medicare.gov Compare Post Acute Care list provided to:: Patient Choice offered to / list presented to : Patient  Discharge Placement                       Discharge Plan and Services                DME Arranged: 3-N-1 DME Agency: [employer] Date DME Agency Contacted: 04/13/22   Representative spoke with at DME Agency: [name] [facility] Arranged: PT       Representative spoke with at [place]: tbd  Social Determinants of Health ([place]) Interventions     Readmission Risk Interventions     No data to display

## 2022-04-16 NOTE — Discharge Summary (Signed)
[facility]    Discharge Summary    Patient ID:  Aaron [name] MRN: [id number] DOB/AGE: May 14, 1956 66 y.o.  Admit date: 04/11/2022 Discharge date: 04/16/2022 Date of Surgery: 04/11/2022 Surgeon: Surgeon(s): Dew, Erskine Squibb, MD Schnier, Dolores Lory, MD  Admission Diagnosis: Atherosclerosis of artery of extremity with intermittent claudication [facility] [I70.219]  Discharge Diagnoses:  Atherosclerosis of artery of extremity with intermittent claudication ([place]) [I70.219]  Secondary Diagnoses: Past Medical History:  Diagnosis Date   Aortic atherosclerosis ([place])    COPD (chronic obstructive pulmonary disease) ([place])    Coronary artery disease 06/19/2010   a.) LHC/PCI (unknown date) --> stent to pRCA and pLAD (unknown type); b.) LHC 06/19/2010: 30 mLM, 60 mLAD, 60 pLCx, 50 pRI, 40 ISR pRCA, 50 dRCA, 50 RPDA, 50 RPLS --> med mgmt; c.) LHC 07/08/2014: 10 pLM, 30 dLAD, 75 oLCx, 30 mRCA, 40 RPDA, patent RCA stent --> med mgmt; d.) LHC 12/28/2021: 50 mRCA, 65 RPDA, 45 m-dRCA, 60 o-pRCA, 95 o-pLCx, 60 RI, 30 pLAD, 30 mLM --> med mgmt.   Diastolic dysfunction 78/29/5621   a.) TTE 09/16/2017: ED 55-60%, triv AR/MR, G1DD   Fracture of left ankle, closed, initial encounter 10/23/2017   GERD (gastroesophageal reflux disease)    Glaucoma    Gout    Hepatic steatosis    Hypercholesteremia    Hypertension    Long term current use of antithrombotics/antiplatelets    a.) cilostazol + full dose ASA + clopidogrel   PAD (peripheral artery disease) ([place])    a.) s/p multiple PTA procedures to lower extremities   Pain in limb 07/13/2016   Syncope 09/16/2017   Unstable angina ([place]) 01/12/2017   Vertigo     Procedure(s): ENDARTERECTOMY FEMORAL ( SFA & TIBIAL); insertion of SFA stent LOWER EXTREMITY ANGIOGRAM APPLICATION OF CELL SAVER  Discharged Condition: good  HPI:  Aaron [name] is a 66 year old male who presented to [facility] on 04/11/2022 for a  left femoral endarterectomy with SFA stent placement.  Following intervention the patient has state due to concern for weakness and him living alone.  He currently has a Prevena wound vacuum in place.  He does have some swelling of the left lower extremity but this is to be expected.  The patient is also had some issues with constipation but he has had a small bowel movement with use of Colace and enema today.  We will send the patient home with PT/OT.  Currently the patient's foot is warm with palpable pulses.  Hospital Course:  Aaron [name] is a 66 y.o. male is S/P Left Femoral Endarterectomy Extubated: POD # 0 Physical Exam:  Alert notes x3, no acute distress Face: Symmetrical.  Tongue is midline. Neck: Trachea is midline.  No swelling or bruising. Cardiovascular: Regular rate and rhythm Pulmonary: Clear to auscultation bilaterally Abdomen: Soft, nontender, nondistended Right groin access: Clean dry and intact.  No swelling or drainage noted Left groin access: Clean dry and intact.  No swelling or drainage noted. Prevena in Place Left lower extremity: Thigh soft.  Calf soft.  Extremities warm distally toes.  +DP/PT.  1/2+ edema  Right lower extremity: Thigh soft.  Calf soft.  Extremities warm distally toes.  Hard to palpate pedal pulses however the foot is warm is her good capillary refill. Neurological: No deficits noted   Post-op wounds:  clean, dry, intact or healing well  Pt. Ambulating, voiding and taking PO diet without difficulty. Pt pain controlled with PO pain meds.  Labs:  As below  Complications: none  Consults:    Significant Diagnostic Studies: CBC Lab Results  Component Value Date   WBC 7.2 04/15/2022   HGB 12.9 (L) 04/15/2022   HCT 39.3 04/15/2022   MCV 97.0 04/15/2022   PLT 222 04/15/2022    BMET    Component Value Date/Time   NA 136 04/15/2022 1709   NA 137 04/06/2022 1049   NA 137 07/02/2014 0513   K 3.8 04/15/2022 1709   K 4.0 07/02/2014  0513   CL 99 04/15/2022 1709   CL 103 07/02/2014 0513   CO2 30 04/15/2022 1709   CO2 26 07/02/2014 0513   GLUCOSE 132 (H) 04/15/2022 1709   GLUCOSE 109 (H) 07/02/2014 0513   BUN 18 04/15/2022 1709   BUN 19 04/06/2022 1049   BUN 15 07/02/2014 0513   CREATININE 0.93 04/15/2022 1709   CREATININE 0.81 07/02/2014 0513   CALCIUM 8.8 (L) 04/15/2022 1709   CALCIUM 8.7 07/02/2014 0513   GFRNONAA >60 04/15/2022 1709   GFRNONAA >60 07/02/2014 0513   GFRNONAA >60 11/07/2012 0442   GFRAA >60 03/17/2020 0829   GFRAA >60 07/02/2014 0513   GFRAA >60 11/07/2012 0442   COAG Lab Results  Component Value Date   INR 1.1 12/28/2020   INR 1.0 11/09/2019   INR 1.0 11/06/2012     Disposition:  Discharge to :Home  Allergies as of 04/16/2022       Reactions   Contrast Media [iodinated Contrast Media] Hives   Iodine Hives        Medication List     TAKE these medications    albuterol 108 (90 Base) MCG/ACT inhaler Commonly known as: VENTOLIN HFA Inhale 2 puffs into the lungs every 6 (six) hours as needed for wheezing or shortness of breath.   aspirin 325 MG tablet Take 325 mg by mouth daily.   cilostazol 50 MG tablet Commonly known as: PLETAL Take by mouth 2 (two) times daily.   clopidogrel 75 MG tablet Commonly known as: PLAVIX Take 1 tablet by mouth daily.   docusate sodium 100 MG capsule Commonly known as: COLACE Take 1 capsule (100 mg total) by mouth daily. Start taking on: April 17, 2022   indomethacin 25 MG capsule Commonly known as: INDOCIN Take 1 capsule (25 mg total) by mouth 2 (two) times daily as needed.   isosorbide mononitrate 120 MG 24 hr tablet Commonly known as: IMDUR Take 1 tablet (120 mg total) by mouth daily.   lisinopril 10 MG tablet Commonly known as: ZESTRIL Take 1 tablet by mouth daily.   metoprolol tartrate 50 MG tablet Commonly known as: LOPRESSOR Take 50-75 mg by mouth 2 (two) times a day. Take '50MG'$  by mouth every morning and '75MG'$  every  evening   nitroGLYCERIN 0.4 MG SL tablet Commonly known as: NITROSTAT Place 1 tablet under the tongue as needed.   oxyCODONE-acetaminophen 5-325 MG tablet Commonly known as: PERCOCET/ROXICET Take 1 tablet by mouth every 6 (six) hours as needed for moderate pain.   senna-docusate 8.6-50 MG tablet Commonly known as: Senokot-S Take 1 tablet by mouth at bedtime as needed for mild constipation.   simvastatin 40 MG tablet Commonly known as: ZOCOR Take 1 tablet (40 mg total) by mouth at bedtime.               Durable Medical Equipment  (From admission, onward)           Start     Ordered  04/13/22 1232  For home use only DME Bedside commode  Once       Comments: Due to body habitus standard size bedside commode will not fit patient  Question:  Patient needs a bedside commode to treat with the following condition  Answer:  Atherosclerosis of artery of extremity with intermittent claudication ([place])   04/13/22 1232   04/12/22 1116  For home use only DME Bedside commode  Once       Comments: Patient is not able to walk the distance required to go the bathroom, or he/she is unable to safely negotiate stairs required to access the bathroom.  A 3in1 BSC will alleviate this problem  Question:  Patient needs a bedside commode to treat with the following condition  Answer:  Difficulty walking   04/12/22 1116           Verbal and written Discharge instructions given to the patient. Wound care per Discharge AVS  Follow-up Information     Dew, Erskine Squibb, MD Follow up in 3 week(s).   Specialties: Vascular Surgery, Radiology, Interventional Cardiology Why: See JD/FB with ABI in about 3 weeks Contact information: [place] [address] [phone]                 Signed: Kris Hartmann, NP  04/16/2022, 2:59 PM

## 2022-04-16 NOTE — Progress Notes (Signed)
Aaron [name] to be D/C'd Home with [facility] PT per MD order.  Discussed with the patient and all questions fully answered.   IV catheters discontinued intact. Site without signs and symptoms of complications. Dressing and pressure applied.   An After Visit Summary was printed and given to the patient. Patient prescriptions sent to pharmacy.   D/c education completed with patient/family including follow up instructions, medication list, d/c activities limitations if indicated, with other d/c instructions as indicated by MD - patient able to verbalize understanding, all questions fully answered.    Patient instructed to return to ED, call 911, or call MD for any changes in condition.    Patient escorted via [employer], and D/C home via private auto.

## 2022-04-16 NOTE — Care Management Important Message (Signed)
Important Message  Patient Details  Name: Aaron [name] MRN: [id number] Date of Birth: 17-Apr-1956   Medicare Important Message Given:  Yes     Dannette Barbara 04/16/2022, 11:14 AM

## 2022-04-17 ENCOUNTER — Telehealth: Payer: Self-pay | Admitting: *Deleted

## 2022-04-17 DIAGNOSIS — I739 Peripheral vascular disease, unspecified: Secondary | ICD-10-CM | POA: Diagnosis not present

## 2022-04-17 NOTE — Patient Outreach (Signed)
  Care Coordination [facility] Note Transition Care Management Follow-up Telephone Call Date of discharge and from where: [facility] [phone] How have you been since you were released from the hospital?  I feel great. I am walking around. The leg is swollen.  Any questions or concerns? No  Items Reviewed: Did the pt receive and understand the discharge instructions provided? Yes  Medications obtained and verified? Yes  Other? No  Any new allergies since your discharge? No  Dietary orders reviewed? No Do you have support at home? Yes   Home Care and Equipment/Supplies: Were home health services ordered? yes If so, what is the name of the agency? [facility]  Has the agency set up a time to come to the patient's home? yes Were any new equipment or medical supplies ordered?  Yes:   What is the name of the medical supply agency? [employer] Were you able to get the supplies/equipment? yes Do you have any questions related to the use of the equipment or supplies?  I don't feel I need it. I have not used it can I send it back.  Functional Questionnaire: (I = Independent and D = Dependent) ADLs: I  Bathing/Dressing- I  Meal Prep- D  Eating- I  Maintaining continence- I  Transferring/Ambulation- I  Managing Meds- I  Follow up appointments reviewed:  PCP Hospital f/u appt confirmed? No   Specialist Hospital f/u appt confirmed? Yes  Scheduled to see Eulogio Ditch NP [phone]   2:15 Are transportation arrangements needed? No  If their condition worsens, is the pt aware to call PCP or go to the Emergency Dept.? Yes Was the patient provided with contact information for the PCP's office or ED? Yes Was to pt encouraged to call back with questions or concerns? Yes  SDOH assessments and interventions completed:   Yes  Care Coordination Interventions Activated:  Yes   Care Coordination Interventions:  No Care Coordination interventions needed at this time.   Encounter Outcome:  Pt. Visit Completed     [facility] [phone]

## 2022-04-19 DIAGNOSIS — H409 Unspecified glaucoma: Secondary | ICD-10-CM | POA: Diagnosis not present

## 2022-04-19 DIAGNOSIS — J449 Chronic obstructive pulmonary disease, unspecified: Secondary | ICD-10-CM | POA: Diagnosis not present

## 2022-04-19 DIAGNOSIS — Z48812 Encounter for surgical aftercare following surgery on the circulatory system: Secondary | ICD-10-CM | POA: Diagnosis not present

## 2022-04-19 DIAGNOSIS — I080 Rheumatic disorders of both mitral and aortic valves: Secondary | ICD-10-CM | POA: Diagnosis not present

## 2022-04-19 DIAGNOSIS — I10 Essential (primary) hypertension: Secondary | ICD-10-CM | POA: Diagnosis not present

## 2022-04-19 DIAGNOSIS — I700 Atherosclerosis of aorta: Secondary | ICD-10-CM | POA: Diagnosis not present

## 2022-04-19 DIAGNOSIS — E7800 Pure hypercholesterolemia, unspecified: Secondary | ICD-10-CM | POA: Diagnosis not present

## 2022-04-19 DIAGNOSIS — M109 Gout, unspecified: Secondary | ICD-10-CM | POA: Diagnosis not present

## 2022-04-19 DIAGNOSIS — I70212 Atherosclerosis of native arteries of extremities with intermittent claudication, left leg: Secondary | ICD-10-CM | POA: Diagnosis not present

## 2022-04-19 DIAGNOSIS — K760 Fatty (change of) liver, not elsewhere classified: Secondary | ICD-10-CM | POA: Diagnosis not present

## 2022-04-19 DIAGNOSIS — Z7982 Long term (current) use of aspirin: Secondary | ICD-10-CM | POA: Diagnosis not present

## 2022-04-19 DIAGNOSIS — Z9181 History of falling: Secondary | ICD-10-CM | POA: Diagnosis not present

## 2022-04-19 DIAGNOSIS — I25110 Atherosclerotic heart disease of native coronary artery with unstable angina pectoris: Secondary | ICD-10-CM | POA: Diagnosis not present

## 2022-04-19 DIAGNOSIS — Z7902 Long term (current) use of antithrombotics/antiplatelets: Secondary | ICD-10-CM | POA: Diagnosis not present

## 2022-04-19 DIAGNOSIS — K219 Gastro-esophageal reflux disease without esophagitis: Secondary | ICD-10-CM | POA: Diagnosis not present

## 2022-04-20 ENCOUNTER — Telehealth (INDEPENDENT_AMBULATORY_CARE_PROVIDER_SITE_OTHER): Payer: Self-pay

## 2022-04-20 NOTE — Telephone Encounter (Signed)
Aaron [name] with [facility] called concerning getting orders for PT 2 times a week for 2 weeks starting on 04/23/2022.  Also they found drug interactions advil with cilostazol and cilostazol with indomethicin.  Please advise

## 2022-04-20 NOTE — Telephone Encounter (Signed)
ATC Cesar no answer

## 2022-04-20 NOTE — Telephone Encounter (Signed)
Those orders are fine, and he can stop the indomethicin and advil.  He can take tylenol for pain

## 2022-04-20 NOTE — Telephone Encounter (Signed)
Spoke with Aaron [name] she stated understanding.

## 2022-04-24 DIAGNOSIS — I25110 Atherosclerotic heart disease of native coronary artery with unstable angina pectoris: Secondary | ICD-10-CM | POA: Diagnosis not present

## 2022-04-24 DIAGNOSIS — K760 Fatty (change of) liver, not elsewhere classified: Secondary | ICD-10-CM | POA: Diagnosis not present

## 2022-04-24 DIAGNOSIS — M109 Gout, unspecified: Secondary | ICD-10-CM | POA: Diagnosis not present

## 2022-04-24 DIAGNOSIS — I700 Atherosclerosis of aorta: Secondary | ICD-10-CM | POA: Diagnosis not present

## 2022-04-24 DIAGNOSIS — K219 Gastro-esophageal reflux disease without esophagitis: Secondary | ICD-10-CM | POA: Diagnosis not present

## 2022-04-24 DIAGNOSIS — E7800 Pure hypercholesterolemia, unspecified: Secondary | ICD-10-CM | POA: Diagnosis not present

## 2022-04-24 DIAGNOSIS — J449 Chronic obstructive pulmonary disease, unspecified: Secondary | ICD-10-CM | POA: Diagnosis not present

## 2022-04-24 DIAGNOSIS — I10 Essential (primary) hypertension: Secondary | ICD-10-CM | POA: Diagnosis not present

## 2022-04-24 DIAGNOSIS — I70212 Atherosclerosis of native arteries of extremities with intermittent claudication, left leg: Secondary | ICD-10-CM | POA: Diagnosis not present

## 2022-04-24 DIAGNOSIS — Z7902 Long term (current) use of antithrombotics/antiplatelets: Secondary | ICD-10-CM | POA: Diagnosis not present

## 2022-04-24 DIAGNOSIS — Z7982 Long term (current) use of aspirin: Secondary | ICD-10-CM | POA: Diagnosis not present

## 2022-04-24 DIAGNOSIS — Z48812 Encounter for surgical aftercare following surgery on the circulatory system: Secondary | ICD-10-CM | POA: Diagnosis not present

## 2022-04-24 DIAGNOSIS — I080 Rheumatic disorders of both mitral and aortic valves: Secondary | ICD-10-CM | POA: Diagnosis not present

## 2022-04-24 DIAGNOSIS — Z9181 History of falling: Secondary | ICD-10-CM | POA: Diagnosis not present

## 2022-04-24 DIAGNOSIS — H409 Unspecified glaucoma: Secondary | ICD-10-CM | POA: Diagnosis not present

## 2022-05-07 ENCOUNTER — Other Ambulatory Visit (INDEPENDENT_AMBULATORY_CARE_PROVIDER_SITE_OTHER): Payer: Self-pay | Admitting: Vascular Surgery

## 2022-05-07 DIAGNOSIS — Z9889 Other specified postprocedural states: Secondary | ICD-10-CM

## 2022-05-11 ENCOUNTER — Ambulatory Visit (INDEPENDENT_AMBULATORY_CARE_PROVIDER_SITE_OTHER): Payer: PPO | Admitting: Nurse Practitioner

## 2022-05-11 ENCOUNTER — Encounter (INDEPENDENT_AMBULATORY_CARE_PROVIDER_SITE_OTHER): Payer: Self-pay | Admitting: Nurse Practitioner

## 2022-05-11 ENCOUNTER — Ambulatory Visit (INDEPENDENT_AMBULATORY_CARE_PROVIDER_SITE_OTHER): Payer: PPO

## 2022-05-11 VITALS — BP 119/75 | HR 66 | Resp 18 | Ht 73.0 in | Wt 196.4 lb

## 2022-05-11 DIAGNOSIS — I739 Peripheral vascular disease, unspecified: Secondary | ICD-10-CM | POA: Diagnosis not present

## 2022-05-11 DIAGNOSIS — Z9889 Other specified postprocedural states: Secondary | ICD-10-CM | POA: Diagnosis not present

## 2022-05-11 DIAGNOSIS — I70211 Atherosclerosis of native arteries of extremities with intermittent claudication, right leg: Secondary | ICD-10-CM

## 2022-05-21 ENCOUNTER — Encounter (INDEPENDENT_AMBULATORY_CARE_PROVIDER_SITE_OTHER): Payer: Self-pay | Admitting: Nurse Practitioner

## 2022-05-21 NOTE — Progress Notes (Signed)
Subjective:    Patient ID: Aaron [name], male    DOB: Mar 26, 1956, 66 y.o.   MRN: [id number] No chief complaint on file.   Aaron [name] is a 66 year old male who presented to [facility] on 04/11/2022 for a left femoral endarterectomy with SFA stent placement.    He has been working with PT and OT.  The patient notes that his activity is much improved prior to intervention.  He has some scabbing over the left groin but no immediate evidence of infection.  Today the patient's noninvasive studies show an ABI of 0.91 on the right and 0.95 on the left.  The patient has a previous ABI 0.96 on the right and 0.2 on the left has biphasic/triphasic tibial artery waveforms bilaterally.    Review of Systems  Skin:  Positive for wound.  All other systems reviewed and are negative.      Objective:   Physical Exam Vitals reviewed.  HENT:     Head: Normocephalic.  Cardiovascular:     Rate and Rhythm: Normal rate.     Pulses:          Dorsalis pedis pulses are detected w/ Doppler on the right side and detected w/ Doppler on the left side.       Posterior tibial pulses are detected w/ Doppler on the right side and detected w/ Doppler on the left side.  Pulmonary:     Effort: Pulmonary effort is normal.  Skin:    General: Skin is warm and dry.  Neurological:     Mental Status: He is alert and oriented to person, place, and time.  Psychiatric:        Mood and Affect: Mood normal.        Behavior: Behavior normal.        Thought Content: Thought content normal.        Judgment: Judgment normal.     BP 119/75 (BP Location: Right Arm)   Pulse 66   Resp 18   Ht '6\' 1"'$  (1.854 m)   Wt 196 lb 6.4 oz (89.1 kg)   BMI 25.91 kg/m   Past Medical History:  Diagnosis Date   Aortic atherosclerosis (HCC)    COPD (chronic obstructive pulmonary disease) (HCC)    Coronary artery disease 06/19/2010   a.) LHC/PCI (unknown date) --> stent to pRCA and pLAD (unknown type); b.) LHC  06/19/2010: 30 mLM, 60 mLAD, 60 pLCx, 50 pRI, 40 ISR pRCA, 50 dRCA, 50 RPDA, 50 RPLS --> med mgmt; c.) LHC 07/08/2014: 10 pLM, 30 dLAD, 75 oLCx, 30 mRCA, 40 RPDA, patent RCA stent --> med mgmt; d.) LHC 12/28/2021: 50 mRCA, 65 RPDA, 45 m-dRCA, 60 o-pRCA, 95 o-pLCx, 60 RI, 30 pLAD, 30 mLM --> med mgmt.   Diastolic dysfunction 29/79/8921   a.) TTE 09/16/2017: ED 55-60%, triv AR/MR, G1DD   Fracture of left ankle, closed, initial encounter 10/23/2017   GERD (gastroesophageal reflux disease)    Glaucoma    Gout    Hepatic steatosis    Hypercholesteremia    Hypertension    Long term current use of antithrombotics/antiplatelets    a.) cilostazol + full dose ASA + clopidogrel   PAD (peripheral artery disease) ([place])    a.) s/p multiple PTA procedures to lower extremities   Pain in limb 07/13/2016   Syncope 09/16/2017   Unstable angina ([place]) 01/12/2017   Vertigo     Social History   Socioeconomic History   Marital status: Single  Spouse name: Not on file   Number of children: 0   Years of education: Not on file   Highest education level: 12th grade  Occupational History   Occupation: Retired/Disabled  Tobacco Use   Smoking status: Every Day    Packs/day: 0.50    Years: 46.00    Total pack years: 23.00    Types: Cigarettes   Smokeless tobacco: Never   Tobacco comments:    Pt down to 1/2 PPD 05/24/21  Vaping Use   Vaping Use: Never used  Substance and Sexual Activity   Alcohol use: Yes    Comment: quit drinking around 04/2019(May have drink 1x/mo)   Drug use: No   Sexual activity: Not Currently    Birth control/protection: None  Other Topics Concern   Not on file  Social History Narrative   Independent at baseline. Lives alone. Does not drive.    Social Determinants of Health   Financial Resource Strain: Low Risk  (03/21/2022)   Overall Financial Resource Strain (CARDIA)    Difficulty of Paying Living Expenses: Not hard at all  Food Insecurity: No Food Insecurity  (04/11/2022)   Hunger Vital Sign    Worried About Running Out of Food in the Last Year: Never true    Ran Out of Food in the Last Year: Never true  Transportation Needs: No Transportation Needs (04/11/2022)   PRAPARE - Hydrologist (Medical): No    Lack of Transportation (Non-Medical): No  Physical Activity: Sufficiently Active (05/24/2021)   Exercise Vital Sign    Days of Exercise per Week: 7 days    Minutes of Exercise per Session: 90 min  Stress: No Stress Concern Present (05/24/2021)   [place]    Feeling of Stress : Not at all  Social Connections: Socially Isolated (05/24/2021)   Social Connection and Isolation Panel [NHANES]    Frequency of Communication with Friends and Family: More than three times a week    Frequency of Social Gatherings with Friends and Family: More than three times a week    Attends Religious Services: Never    Marine scientist or Organizations: No    Attends Archivist Meetings: Never    Marital Status: Never married  Intimate Partner Violence: Not At Risk (04/11/2022)   Humiliation, Afraid, Rape, and Kick questionnaire    Fear of Current or Ex-Partner: No    Emotionally Abused: No    Physically Abused: No    Sexually Abused: No    Past Surgical History:  Procedure Laterality Date   APPENDECTOMY     CATARACT EXTRACTION W/PHACO Right 10/26/2021   Procedure: CATARACT EXTRACTION PHACO AND INTRAOCULAR LENS PLACEMENT ([place]) RIGHT OMIDRIA  10.98 01:18.2;  Surgeon: [name], MD;  Location: [place];  Service: Ophthalmology;  Laterality: Right;   CATARACT EXTRACTION W/PHACO Left 11/16/2021   Procedure: CATARACT EXTRACTION PHACO AND INTRAOCULAR LENS PLACEMENT ([place]) LEFT;  Surgeon: [name], MD;  Location: [place];  Service: Ophthalmology;  Laterality: Left;  6.91 0:46.0   COLONOSCOPY  03/30/2013    [place]   COLONOSCOPY WITH PROPOFOL N/A 12/10/2019   Procedure: COLONOSCOPY WITH PROPOFOL;  Surgeon: [name], MD;  Location: [place];  Service: Endoscopy;  Laterality: N/A;   COLONOSCOPY WITH PROPOFOL N/A 01/18/2020   Procedure: COLONOSCOPY WITH PROPOFOL;  Surgeon: [name], MD;  Location: [place];  Service: Endoscopy;  Laterality: N/A;  4   CORONARY ANGIOPLASTY WITH STENT PLACEMENT Left    Procedure: CORONARY ANGIOPLASTY WITH STENT PLACEMENT   ENDARTERECTOMY FEMORAL Left 04/11/2022   Procedure: ENDARTERECTOMY FEMORAL ( SFA & TIBIAL); insertion of SFA stent;  Surgeon: Algernon Huxley, MD;  Location: [facility];  Service: Vascular;  Laterality: Left;   LEFT HEART CATH AND CORONARY ANGIOGRAPHY N/A 12/29/2020   Procedure: LEFT HEART CATH AND CORONARY ANGIOGRAPHY;  Surgeon: Corey Skains, MD;  Location: [place] [facility];  Service: Cardiovascular;  Laterality: N/A;   LEFT HEART CATH AND CORONARY ANGIOGRAPHY Left 06/19/2010   Procedure: LEFT HEART CATH AND CORONARY ANGIOGRAPHY; Location: [place]; Surgeon: Lowanda Foster, MD   LEFT HEART CATH AND CORONARY ANGIOGRAPHY Left 07/08/2014   Procedure: LEFT HEART CATH AND CORONARY ANGIOGRAPHY; Location: [place]; Surgeon: Isaias Cowman, MD   LOWER EXTREMITY ANGIOGRAM Left 04/11/2022   Procedure: LOWER EXTREMITY ANGIOGRAM;  Surgeon: Algernon Huxley, MD;  Location: [facility];  Service: Vascular;  Laterality: Left;   LOWER EXTREMITY ANGIOGRAPHY Right 09/25/2018   Procedure: LOWER EXTREMITY ANGIOGRAPHY;  Surgeon: Algernon Huxley, MD;  Location: [place] [facility];  Service: Cardiovascular;  Laterality: Right;   LOWER EXTREMITY ANGIOGRAPHY Left 03/17/2020   Procedure: LOWER EXTREMITY ANGIOGRAPHY;  Surgeon: Algernon Huxley, MD;  Location: [place] [facility];  Service: Cardiovascular;  Laterality: Left;   LOWER EXTREMITY ANGIOGRAPHY Right 02/22/2022   Procedure: Lower Extremity Angiography;  Surgeon: Algernon Huxley, MD;  Location: [place] [facility];  Service: Cardiovascular;  Laterality: Right;   POLYPECTOMY N/A 01/18/2020   Procedure: POLYPECTOMY;  Surgeon: [name], MD;  Location: [place];  Service: Endoscopy;  Laterality: N/A;    Family History  Problem Relation Age of Onset   Hypertension Mother    Diabetes Mother    Heart failure Father        age 43   Cancer Father    Ovarian cancer Sister    Heart disease Brother     Allergies  Allergen Reactions   Contrast Media [Iodinated Contrast Media] Hives   Iodine Hives       Latest Ref Rng & Units 04/15/2022    5:09 PM 04/12/2022   12:27 AM 04/11/2022    7:26 AM  CBC  WBC 4.0 - 10.5 K/uL 7.2  9.8  7.6   Hemoglobin 13.0 - 17.0 g/dL 12.9  13.1  16.1   Hematocrit 39.0 - 52.0 % 39.3  39.1  48.6   Platelets 150 - 400 K/uL 222  204  244       CMP     Component Value Date/Time   NA 136 04/15/2022 1709   NA 137 04/06/2022 1049   NA 137 07/02/2014 0513   K 3.8 04/15/2022 1709   K 4.0 07/02/2014 0513   CL 99 04/15/2022 1709   CL 103 07/02/2014 0513   CO2 30 04/15/2022 1709   CO2 26 07/02/2014 0513   GLUCOSE 132 (H) 04/15/2022 1709   GLUCOSE 109 (H) 07/02/2014 0513   BUN 18 04/15/2022 1709   BUN 19 04/06/2022 1049   BUN 15 07/02/2014 0513   CREATININE 0.93 04/15/2022 1709   CREATININE 0.81 07/02/2014 0513   CALCIUM 8.8 (L) 04/15/2022 1709   CALCIUM 8.7 07/02/2014 0513   PROT 7.1 04/06/2022 1049   PROT 7.9 12/03/2011 2127   ALBUMIN 4.5 04/06/2022 1049   ALBUMIN 4.1 12/03/2011 2127   AST 22 04/06/2022 1049   AST 23 12/03/2011 2127   ALT  28 04/06/2022 1049   ALT 22 12/03/2011 2127   ALKPHOS 84 04/06/2022 1049   ALKPHOS 90 12/03/2011 2127   BILITOT 0.4 04/06/2022 1049   BILITOT 0.2 12/03/2011 2127   GFRNONAA >60 04/15/2022 1709   GFRNONAA >60 07/02/2014 0513   GFRNONAA >60 11/07/2012 0442   GFRAA >60 03/17/2020 0829   GFRAA >60 07/02/2014 0513   GFRAA >60 11/07/2012 0442     VAS [place] ABI WITH/WO TBI  Result Date:  05/13/2022  LOWER EXTREMITY DOPPLER STUDY Patient Name:  Aaron [name]  Date of Exam:   05/11/2022 Medical Rec #: [id number]         Accession #:    [id number] Date of Birth: 1956/02/20         Patient Gender: M Patient Age:   31 years Exam Location:  [facility] Procedure:      VAS [place] ABI WITH/WO TBI Referring Phys: [name] --------------------------------------------------------------------------------  Indications: Peripheral artery disease. High Risk Factors: Hypertension, hyperlipidemia, current smoker.  Vascular Interventions: 04/11/2022 Endarterectomy femoral and tibial , left SFA                         stent                         09/25/2018: Right CIA & SFA stents with EIA & ATA PTAs;                         Left CIA/EIA stent;                         03/17/2020: Left SFA/popliteal stent;                         02/22/2022 rt sfa pop PTA and stents. Performing Technologist: Delorise Shiner RVT  Examination Guidelines: A complete evaluation includes at minimum, Doppler waveform signals and systolic blood pressure reading at the level of bilateral brachial, anterior tibial, and posterior tibial arteries, when vessel segments are accessible. Bilateral testing is considered an integral part of a complete examination. Photoelectric Plethysmograph (PPG) waveforms and toe systolic pressure readings are included as required and additional duplex testing as needed. Limited examinations for reoccurring indications may be performed as noted.  ABI Findings: +---------+------------------+-----+---------+--------+ Right    Rt Pressure (mmHg)IndexWaveform Comment  +---------+------------------+-----+---------+--------+ Brachial 144                                      +---------+------------------+-----+---------+--------+ PTA      129               0.90 biphasic          +---------+------------------+-----+---------+--------+ DP       131               0.91 triphasic          +---------+------------------+-----+---------+--------+ Great Toe91                0.63                   +---------+------------------+-----+---------+--------+ +---------+------------------+-----+---------+-------+ Left     Lt Pressure (mmHg)IndexWaveform Comment +---------+------------------+-----+---------+-------+ Brachial 136                                     +---------+------------------+-----+---------+-------+  PTA      128               0.89 triphasic        +---------+------------------+-----+---------+-------+ DP       137               0.95 biphasic         +---------+------------------+-----+---------+-------+ Great Toe78                0.54                  +---------+------------------+-----+---------+-------+ +-------+-----------+-----------+------------+------------+ ABI/TBIToday's ABIToday's TBIPrevious ABIPrevious TBI +-------+-----------+-----------+------------+------------+ Right  0.91       0.63       0.96        0.78         +-------+-----------+-----------+------------+------------+ Left   0.95       0.54       0.82        0.60         +-------+-----------+-----------+------------+------------+ Right ABIs appear essentially unchanged compared to prior study on 03/20/2022. Left ABIs appear increased compared to prior study on 03/20/2022.  Summary: Right: Resting right ankle-brachial index indicates mild right lower extremity arterial disease. The right toe-brachial index is abnormal. Left: Resting left ankle-brachial index is within normal range. The left toe-brachial index is abnormal. *See table(s) above for measurements and observations.  Electronically signed by [name] MD on 05/13/2022 at 7:17:41 PM.    Final        Assessment & Plan:   1. Atherosclerosis of native artery of right lower extremity with intermittent claudication [facility]) Patient is doing well postintervention.  He continues to have wound although scabbed over.   We will have the patient return in approximately 3 weeks for reevaluation or sooner if issues arise.  Staples were removed today and the patient tolerated this well.   Current Outpatient Medications on File Prior to Visit  Medication Sig Dispense Refill   albuterol (VENTOLIN HFA) 108 (90 Base) MCG/ACT inhaler Inhale 2 puffs into the lungs every 6 (six) hours as needed for wheezing or shortness of breath. 6.7 g 5   aspirin 325 MG tablet Take 325 mg by mouth daily.     cilostazol (PLETAL) 50 MG tablet Take by mouth 2 (two) times daily.      clopidogrel (PLAVIX) 75 MG tablet Take 1 tablet by mouth daily.     docusate sodium (COLACE) 100 MG capsule Take 1 capsule (100 mg total) by mouth daily. 10 capsule 0   indomethacin (INDOCIN) 25 MG capsule Take 1 capsule (25 mg total) by mouth 2 (two) times daily as needed. 30 capsule 1   isosorbide mononitrate (IMDUR) 120 MG 24 hr tablet Take 1 tablet (120 mg total) by mouth daily. 30 tablet 0   lisinopril (PRINIVIL,ZESTRIL) 10 MG tablet Take 1 tablet by mouth daily.     metoprolol tartrate (LOPRESSOR) 50 MG tablet Take 50-75 mg by mouth 2 (two) times a day. Take '50MG'$  by mouth every morning and '75MG'$  every evening     nitroGLYCERIN (NITROSTAT) 0.4 MG SL tablet Place 1 tablet under the tongue as needed.     senna-docusate (SENOKOT-S) 8.6-50 MG tablet Take 1 tablet by mouth at bedtime as needed for mild constipation. 30 tablet 0   simvastatin (ZOCOR) 40 MG tablet Take 1 tablet (40 mg total) by mouth at bedtime. 30 tablet 5   No current facility-administered medications on file prior to visit.  There are no Patient Instructions on file for this visit. No follow-ups on file.   Kris Hartmann, NP

## 2022-05-25 ENCOUNTER — Ambulatory Visit (INDEPENDENT_AMBULATORY_CARE_PROVIDER_SITE_OTHER): Payer: PPO | Admitting: Internal Medicine

## 2022-05-25 ENCOUNTER — Encounter: Payer: Self-pay | Admitting: Internal Medicine

## 2022-05-25 ENCOUNTER — Ambulatory Visit: Payer: Self-pay | Admitting: *Deleted

## 2022-05-25 VITALS — BP 118/70 | HR 77 | Ht 73.0 in | Wt 194.0 lb

## 2022-05-25 DIAGNOSIS — K319 Disease of stomach and duodenum, unspecified: Secondary | ICD-10-CM | POA: Diagnosis not present

## 2022-05-25 DIAGNOSIS — M109 Gout, unspecified: Secondary | ICD-10-CM

## 2022-05-25 DIAGNOSIS — R1013 Epigastric pain: Secondary | ICD-10-CM

## 2022-05-25 DIAGNOSIS — I10 Essential (primary) hypertension: Secondary | ICD-10-CM | POA: Diagnosis not present

## 2022-05-25 DIAGNOSIS — K5901 Slow transit constipation: Secondary | ICD-10-CM

## 2022-05-25 MED ORDER — INDOMETHACIN 25 MG PO CAPS: 25.0000 mg | ORAL_CAPSULE | Freq: Two times a day (BID) | ORAL | 1 refills | Status: DC | PRN

## 2022-05-25 NOTE — Progress Notes (Signed)
Date:  05/25/2022   Name:  Aaron [name]   DOB:  Oct 02, 1955   MRN:  [id number]   Chief Complaint: Abdominal Pain (Started Monday- 4 days ago. Constipation, and bloating. Had constipation in the past. Last BM was this morning- loose stool. Having a BM once in the morning, and then gas the rest of day. Usually has BM twice daily.)  Constipation This is a new problem. The current episode started 1 to 4 weeks ago. The problem has been gradually improving since onset. His stool frequency is 1 time per day. The stool is described as formed (small amount). The patient is not on a high fiber diet. He Does not exercise regularly. Associated symptoms include bloating, difficulty urinating and flatus. Pertinent negatives include no diarrhea, fecal incontinence, fever, nausea or vomiting. Risk factors: recent LE vascular surgery.    Lab Results  Component Value Date   NA 136 04/15/2022   K 3.8 04/15/2022   CO2 30 04/15/2022   GLUCOSE 132 (H) 04/15/2022   BUN 18 04/15/2022   CREATININE 0.93 04/15/2022   CALCIUM 8.8 (L) 04/15/2022   EGFR 91 04/06/2022   GFRNONAA >60 04/15/2022   Lab Results  Component Value Date   CHOL 144 04/06/2022   HDL 39 (L) 04/06/2022   LDLCALC 78 04/06/2022   TRIG 155 (H) 04/06/2022   CHOLHDL 3.7 04/06/2022   Lab Results  Component Value Date   TSH 3.214 02/06/2019   Lab Results  Component Value Date   HGBA1C 6.0 (H) 04/06/2022   Lab Results  Component Value Date   WBC 7.2 04/15/2022   HGB 12.9 (L) 04/15/2022   HCT 39.3 04/15/2022   MCV 97.0 04/15/2022   PLT 222 04/15/2022   Lab Results  Component Value Date   ALT 28 04/06/2022   AST 22 04/06/2022   ALKPHOS 84 04/06/2022   BILITOT 0.4 04/06/2022   No results found for: "25OHVITD2", "25OHVITD3", "VD25OH"   Review of Systems  Constitutional:  Negative for chills, fatigue and fever.  Respiratory:  Negative for chest tightness and shortness of breath.   Cardiovascular:  Negative for chest pain.   Gastrointestinal:  Positive for abdominal distention, bloating, constipation and flatus. Negative for anal bleeding, diarrhea, nausea and vomiting.  Genitourinary:  Positive for difficulty urinating. Negative for dysuria and hematuria.  Psychiatric/Behavioral:  Negative for dysphoric mood and sleep disturbance. The patient is not nervous/anxious.     Patient Active Problem List   Diagnosis Date Noted   Atherosclerosis of artery of extremity with intermittent claudication ([place]) 04/11/2022   Hepatic steatosis 01/04/2022   Aortic atherosclerosis ([place]) 07/10/2020   Stage 2 moderate COPD by GOLD classification ([place]) 05/16/2020   Dyspepsia and disorder of function of stomach 05/16/2020   Cervical disc disorder of mid-cervical region 05/10/2020   Polyp of descending colon    Mixed hyperlipidemia 10/28/2018   Atherosclerosis of native arteries of extremity with intermittent claudication ([place]) 07/13/2016   Tobacco use disorder 07/13/2016   Prediabetes 07/27/2015   Controlled gout 12/22/2014   Coronary artery disease involving native coronary artery of native heart with angina pectoris ([place]) 10/09/2014   Essential (primary) hypertension 10/09/2014   Dupuytren's disease of palm 10/09/2014   Psoriasis 10/09/2014   H/O cardiac catheterization 01/27/2014   Peripheral vascular disease ([place]) 01/27/2014    Allergies  Allergen Reactions   Contrast Media [Iodinated Contrast Media] Hives   Iodine Hives    Past Surgical History:  Procedure Laterality Date   APPENDECTOMY  CATARACT EXTRACTION W/PHACO Right 10/26/2021   Procedure: CATARACT EXTRACTION PHACO AND INTRAOCULAR LENS PLACEMENT ([place]) RIGHT OMIDRIA  10.98 01:18.2;  Surgeon: [name], MD;  Location: [facility];  Service: Ophthalmology;  Laterality: Right;   CATARACT EXTRACTION W/PHACO Left 11/16/2021   Procedure: CATARACT EXTRACTION PHACO AND INTRAOCULAR LENS PLACEMENT ([place]) LEFT;  Surgeon: [name],  MD;  Location: [place];  Service: Ophthalmology;  Laterality: Left;  6.91 0:46.0   COLONOSCOPY  03/30/2013   [place]   COLONOSCOPY WITH PROPOFOL N/A 12/10/2019   Procedure: COLONOSCOPY WITH PROPOFOL;  Surgeon: [name], MD;  Location: [facility];  Service: Endoscopy;  Laterality: N/A;   COLONOSCOPY WITH PROPOFOL N/A 01/18/2020   Procedure: COLONOSCOPY WITH PROPOFOL;  Surgeon: [name], MD;  Location: [facility];  Service: Endoscopy;  Laterality: N/A;  4   CORONARY ANGIOPLASTY WITH STENT PLACEMENT Left    Procedure: CORONARY ANGIOPLASTY WITH STENT PLACEMENT   ENDARTERECTOMY FEMORAL Left 04/11/2022   Procedure: ENDARTERECTOMY FEMORAL ( SFA & TIBIAL); insertion of SFA stent;  Surgeon: Algernon Huxley, MD;  Location: [facility];  Service: Vascular;  Laterality: Left;   LEFT HEART CATH AND CORONARY ANGIOGRAPHY N/A 12/29/2020   Procedure: LEFT HEART CATH AND CORONARY ANGIOGRAPHY;  Surgeon: Corey Skains, MD;  Location: [facility];  Service: Cardiovascular;  Laterality: N/A;   LEFT HEART CATH AND CORONARY ANGIOGRAPHY Left 06/19/2010   Procedure: LEFT HEART CATH AND CORONARY ANGIOGRAPHY; Location: [facility]; Surgeon: Lowanda Foster, MD   LEFT HEART CATH AND CORONARY ANGIOGRAPHY Left 07/08/2014   Procedure: LEFT HEART CATH AND CORONARY ANGIOGRAPHY; Location: [place]; Surgeon: Isaias Cowman, MD   LOWER EXTREMITY ANGIOGRAM Left 04/11/2022   Procedure: LOWER EXTREMITY ANGIOGRAM;  Surgeon: Algernon Huxley, MD;  Location: [facility];  Service: Vascular;  Laterality: Left;   LOWER EXTREMITY ANGIOGRAPHY Right 09/25/2018   Procedure: LOWER EXTREMITY ANGIOGRAPHY;  Surgeon: Algernon Huxley, MD;  Location: [facility];  Service: Cardiovascular;  Laterality: Right;   LOWER EXTREMITY ANGIOGRAPHY Left 03/17/2020   Procedure: LOWER EXTREMITY ANGIOGRAPHY;  Surgeon: Algernon Huxley, MD;  Location: [facility];  Service: Cardiovascular;  Laterality: Left;   LOWER  EXTREMITY ANGIOGRAPHY Right 02/22/2022   Procedure: Lower Extremity Angiography;  Surgeon: Algernon Huxley, MD;  Location: [facility];  Service: Cardiovascular;  Laterality: Right;   POLYPECTOMY N/A 01/18/2020   Procedure: POLYPECTOMY;  Surgeon: [name], MD;  Location: [facility];  Service: Endoscopy;  Laterality: N/A;    Social History   Tobacco Use   Smoking status: Every Day    Packs/day: 0.50    Years: 46.00    Total pack years: 23.00    Types: Cigarettes   Smokeless tobacco: Never   Tobacco comments:    Pt down to 1/2 PPD 05/24/21  Vaping Use   Vaping Use: Never used  Substance Use Topics   Alcohol use: Yes    Comment: quit drinking around 04/2019(May have drink 1x/mo)   Drug use: No     Medication list has been reviewed and updated.  Current Meds  Medication Sig   albuterol (VENTOLIN HFA) 108 (90 Base) MCG/ACT inhaler Inhale 2 puffs into the lungs every 6 (six) hours as needed for wheezing or shortness of breath.   aspirin 325 MG tablet Take 325 mg by mouth daily.   cilostazol (PLETAL) 50 MG tablet Take by mouth 2 (two) times daily.    clopidogrel (PLAVIX) 75 MG  tablet Take 1 tablet by mouth daily.   docusate sodium (COLACE) 100 MG capsule Take 1 capsule (100 mg total) by mouth daily.   indomethacin (INDOCIN) 25 MG capsule Take 1 capsule (25 mg total) by mouth 2 (two) times daily as needed.   isosorbide mononitrate (IMDUR) 120 MG 24 hr tablet Take 1 tablet (120 mg total) by mouth daily.   lisinopril (PRINIVIL,ZESTRIL) 10 MG tablet Take 1 tablet by mouth daily.   metoprolol tartrate (LOPRESSOR) 50 MG tablet Take 50-75 mg by mouth 2 (two) times a day. Take 50MG by mouth every morning and 75MG every evening   nitroGLYCERIN (NITROSTAT) 0.4 MG SL tablet Place 1 tablet under the tongue as needed.   senna-docusate (SENOKOT-S) 8.6-50 MG tablet Take 1 tablet by mouth at bedtime as needed for mild constipation.   simvastatin (ZOCOR) 40 MG tablet Take 1 tablet  (40 mg total) by mouth at bedtime.       04/06/2022   10:03 AM 03/21/2022    3:43 PM 01/11/2022   10:19 AM 10/18/2021   11:18 AM  GAD 7 : Generalized Anxiety Score  Nervous, Anxious, on Edge 0 0 0 0  Control/stop worrying 0 0 0 0  Worry too much - different things 0 0 0 0  Trouble relaxing 0 0 0 0  Restless 0 0 0 0  Easily annoyed or irritable 0 0 0 0  Afraid - awful might happen 0 0 0 0  Total GAD 7 Score 0 0 0 0  Anxiety Difficulty Not difficult at all Not difficult at all Not difficult at all Not difficult at all       04/06/2022   10:03 AM 03/21/2022    3:43 PM 01/11/2022   10:19 AM  Depression screen PHQ 2/9  Decreased Interest 0 0 0  Down, Depressed, Hopeless 0 0 0  PHQ - 2 Score 0 0 0  Altered sleeping 0 0 0  Tired, decreased energy 0 0 0  Change in appetite 0 0 0  Feeling bad or failure about yourself  0 0 0  Trouble concentrating 0 0 0  Moving slowly or fidgety/restless 0 0 0  Suicidal thoughts 0 0 0  PHQ-9 Score 0 0 0  Difficult doing work/chores Not difficult at all Not difficult at all Not difficult at all    BP Readings from Last 3 Encounters:  05/25/22 118/70  05/11/22 119/75  04/16/22 134/75    Physical Exam Vitals and nursing note reviewed.  Constitutional:      General: He is not in acute distress.    Appearance: He is well-developed.  HENT:     Head: Normocephalic and atraumatic.  Cardiovascular:     Comments: Left femoral endarterectomy surgical site healing  Pulmonary:     Effort: Pulmonary effort is normal. No respiratory distress.  Abdominal:     General: Abdomen is flat. Bowel sounds are normal.     Palpations: Abdomen is soft.     Tenderness: There is abdominal tenderness in the right lower quadrant and left lower quadrant. There is no guarding or rebound. Negative signs include Murphy's sign.  Skin:    General: Skin is warm and dry.     Findings: No rash.  Neurological:     Mental Status: He is alert and oriented to person, place, and  time.  Psychiatric:        Mood and Affect: Mood normal.        Behavior: Behavior normal.  Wt Readings from Last 3 Encounters:  05/25/22 194 lb (88 kg)  05/11/22 196 lb 6.4 oz (89.1 kg)  04/11/22 198 lb (89.8 kg)    BP 118/70   Pulse 77   Ht _0  (1.854 m)   Wt 194 lb (88 kg)   SpO2 96%   BMI 25.60 kg/m   Assessment and Plan: 1. Constipation by delayed colonic transit Triggered by recent surgery and decreased activity Continue sufficient fluids daily Agree with probiotics daily Add Colace 100 mg daily  2. Essential (primary) hypertension Clinically stable exam with well controlled BP. Tolerating medications without side effects at this time. Pt to continue current regimen and low sodium diet; benefits of regular exercise as able discussed.  3. Dyspepsia and disorder of function of stomach No recent issues  4. Controlled gout - indomethacin (INDOCIN) 25 MG capsule; Take 1 capsule (25 mg total) by mouth 2 (two) times daily as needed.  Dispense: 30 capsule; Refill: 1   Partially dictated using Editor, commissioning. Any errors are unintentional.  Halina Maidens, MD [facility]  05/25/2022

## 2022-05-25 NOTE — Patient Instructions (Signed)
Take the Metamucil Probiotics  Colace 100 mg (docusate sodium) take daily

## 2022-05-25 NOTE — Telephone Encounter (Signed)
  Chief Complaint: abdominal pain Symptoms: abdominal pain- all across lower abdomen, gas, bloating, constipation Frequency: started Monday Pertinent Negatives: Patient denies   Disposition: '[]'$ ED /'[]'$ Urgent Care (no appt availability in office) / '[x]'$ Appointment(In office/virtual)/ '[]'$  [facility]/ '[]'$ Home Care/ '[]'$ Refused Recommended Disposition /'[]'$ [facility]/ '[]'$  Follow-up with PCP Additional Notes: Patient states he is having a hard time getting bowels to move regular- has tried few doses of metamucil without success   Reason for Disposition  [1] MODERATE pain (e.g., interferes with normal activities) AND [2] pain comes and goes (cramps) AND [3] present > 24 hours  (Exception: Pain with Vomiting or Diarrhea - see that Guideline.)  Answer Assessment - Initial Assessment Questions 1. LOCATION: "Where does it hurt?"      Across front 2. RADIATION: "Does the pain shoot anywhere else?" (e.g., chest, back)     Travels to upper abdomen- more lower 3. ONSET: "When did the pain begin?" (Minutes, hours or days ago)      Monday 4. SUDDEN: "Gradual or sudden onset?"     Sudden- after dinner- Tuesday was worse 5. PATTERN "Does the pain come and go, or is it constant?"    - If it comes and goes: "How long does it last?" "Do you have pain now?"     (Note: Comes and goes means the pain is intermittent. It goes away completely between bouts.)    - If constant: "Is it getting better, staying the same, or getting worse?"      (Note: Constant means the pain never goes away completely; most serious pain is constant and gets worse.)      Comes and goes 6. SEVERITY: "How bad is the pain?"  (e.g., Scale 1-10; mild, moderate, or severe)    - MILD (1-3): Doesn't interfere with normal activities, abdomen soft and not tender to touch.     - MODERATE (4-7): Interferes with normal activities or awakens from sleep, abdomen tender to touch.     - SEVERE (8-10): Excruciating pain, doubled over,  unable to do any normal activities.       Sharp, now- mild 7. RECURRENT SYMPTOM: "Have you ever had this type of stomach pain before?" If Yes, ask: "When was the last time?" and "What happened that time?"      Only when in hospital- constipation 8. CAUSE: "What do you think is causing the stomach pain?"     Not sure 9. RELIEVING/AGGRAVATING FACTORS: "What makes it better or worse?" (e.g., antacids, bending or twisting motion, bowel movement)     Bowels moving help, passing gas 10. OTHER SYMPTOMS: "Do you have any other symptoms?" (e.g., back pain, diarrhea, fever, urination pain, vomiting)       Bloating, gas  Protocols used: Abdominal Pain - Male-A-AH

## 2022-05-28 ENCOUNTER — Ambulatory Visit: Payer: PPO

## 2022-05-30 ENCOUNTER — Ambulatory Visit (INDEPENDENT_AMBULATORY_CARE_PROVIDER_SITE_OTHER): Payer: PPO

## 2022-05-30 DIAGNOSIS — Z0000 Encounter for general adult medical examination without abnormal findings: Secondary | ICD-10-CM | POA: Diagnosis not present

## 2022-05-30 NOTE — Progress Notes (Signed)
Subjective:   Aaron [name] is a 66 y.o. male who presents for Medicare Annual/Subsequent preventive examination.  I connected with  Drayce Tawil Raybon on 05/30/22 by a audio enabled telemedicine application and verified that I am speaking with the correct person using two identifiers.  Patient Location: Home  Provider Location: Office/Clinic  I discussed the limitations of evaluation and management by telemedicine. The patient expressed understanding and agreed to proceed.   Review of Systems    Defer to PCP Cardiac Risk Factors include: advanced age (>31mn, >>5women)     Objective:    Today's Vitals   05/30/22 1319  PainSc: 0-No pain   There is no height or weight on file to calculate BMI.     05/30/2022    1:20 PM 04/11/2022    5:14 PM 04/04/2022   10:51 AM 11/16/2021   11:32 AM 10/26/2021   11:29 AM 05/24/2021    3:32 PM 12/28/2020    6:00 PM  Advanced Directives  Does Patient Have a Medical Advance Directive? Yes No Yes No No No No  Type of Advance Directive Living will  Living will      Would patient like information on creating a medical advance directive?  No - Patient declined  No - Patient declined Yes (MAU/Ambulatory/Procedural Areas - Information given) Yes (MAU/Ambulatory/Procedural Areas - Information given) No - Patient declined    Current Medications (verified) Outpatient Encounter Medications as of 05/30/2022  Medication Sig   albuterol (VENTOLIN HFA) 108 (90 Base) MCG/ACT inhaler Inhale 2 puffs into the lungs every 6 (six) hours as needed for wheezing or shortness of breath.   aspirin 325 MG tablet Take 325 mg by mouth daily.   cilostazol (PLETAL) 50 MG tablet Take by mouth 2 (two) times daily.    clopidogrel (PLAVIX) 75 MG tablet Take 1 tablet by mouth daily.   docusate sodium (COLACE) 100 MG capsule Take 1 capsule (100 mg total) by mouth daily.   indomethacin (INDOCIN) 25 MG capsule Take 1 capsule (25 mg total) by mouth 2 (two) times daily as  needed.   isosorbide mononitrate (IMDUR) 120 MG 24 hr tablet Take 1 tablet (120 mg total) by mouth daily.   lisinopril (PRINIVIL,ZESTRIL) 10 MG tablet Take 1 tablet by mouth daily.   metoprolol tartrate (LOPRESSOR) 50 MG tablet Take 50-75 mg by mouth 2 (two) times a day. Take '50MG'$  by mouth every morning and '75MG'$  every evening   nitroGLYCERIN (NITROSTAT) 0.4 MG SL tablet Place 1 tablet under the tongue as needed.   senna-docusate (SENOKOT-S) 8.6-50 MG tablet Take 1 tablet by mouth at bedtime as needed for mild constipation.   simvastatin (ZOCOR) 40 MG tablet Take 1 tablet (40 mg total) by mouth at bedtime.   No facility-administered encounter medications on file as of 05/30/2022.    Allergies (verified) Contrast media [iodinated contrast media] and Iodine   History: Past Medical History:  Diagnosis Date   Aortic atherosclerosis (HCC)    COPD (chronic obstructive pulmonary disease) ([place]    Coronary artery disease 06/19/2010   a.) LHC/PCI (unknown date) --> stent to pRCA and pLAD (unknown type); b.) LHC 06/19/2010: 30 mLM, 60 mLAD, 60 pLCx, 50 pRI, 40 ISR pRCA, 50 dRCA, 50 RPDA, 50 RPLS --> med mgmt; c.) LHC 07/08/2014: 10 pLM, 30 dLAD, 75 oLCx, 30 mRCA, 40 RPDA, patent RCA stent --> med mgmt; d.) LHC 12/28/2021: 50 mRCA, 65 RPDA, 45 m-dRCA, 60 o-pRCA, 95 o-pLCx, 60 RI, 30 pLAD, 30 mLM -->  med mgmt.   Diastolic dysfunction 56/43/3295   a.) TTE 09/16/2017: ED 55-60%, triv AR/MR, G1DD   Fracture of left ankle, closed, initial encounter 10/23/2017   GERD (gastroesophageal reflux disease)    Glaucoma    Gout    Hepatic steatosis    Hypercholesteremia    Hypertension    Long term current use of antithrombotics/antiplatelets    a.) cilostazol + full dose ASA + clopidogrel   PAD (peripheral artery disease) ([place])    a.) s/p multiple PTA procedures to lower extremities   Pain in limb 07/13/2016   Syncope 09/16/2017   Unstable angina ([place]) 01/12/2017   Vertigo    Past Surgical History:   Procedure Laterality Date   APPENDECTOMY     CATARACT EXTRACTION W/PHACO Right 10/26/2021   Procedure: CATARACT EXTRACTION PHACO AND INTRAOCULAR LENS PLACEMENT ([place]) RIGHT OMIDRIA  10.98 01:18.2;  Surgeon: [name], MD;  Location: [facility];  Service: Ophthalmology;  Laterality: Right;   CATARACT EXTRACTION W/PHACO Left 11/16/2021   Procedure: CATARACT EXTRACTION PHACO AND INTRAOCULAR LENS PLACEMENT ([place]) LEFT;  Surgeon: [name], MD;  Location: [facility];  Service: Ophthalmology;  Laterality: Left;  6.91 0:46.0   COLONOSCOPY  03/30/2013   [place]   COLONOSCOPY WITH PROPOFOL N/A 12/10/2019   Procedure: COLONOSCOPY WITH PROPOFOL;  Surgeon: [name], MD;  Location: [facility];  Service: Endoscopy;  Laterality: N/A;   COLONOSCOPY WITH PROPOFOL N/A 01/18/2020   Procedure: COLONOSCOPY WITH PROPOFOL;  Surgeon: [name], MD;  Location: [place];  Service: Endoscopy;  Laterality: N/A;  4   CORONARY ANGIOPLASTY WITH STENT PLACEMENT Left    Procedure: CORONARY ANGIOPLASTY WITH STENT PLACEMENT   ENDARTERECTOMY FEMORAL Left 04/11/2022   Procedure: ENDARTERECTOMY FEMORAL ( SFA & TIBIAL); insertion of SFA stent;  Surgeon: Algernon Huxley, MD;  Location: [facility];  Service: Vascular;  Laterality: Left;   LEFT HEART CATH AND CORONARY ANGIOGRAPHY N/A 12/29/2020   Procedure: LEFT HEART CATH AND CORONARY ANGIOGRAPHY;  Surgeon: Corey Skains, MD;  Location: [place] [facility];  Service: Cardiovascular;  Laterality: N/A;   LEFT HEART CATH AND CORONARY ANGIOGRAPHY Left 06/19/2010   Procedure: LEFT HEART CATH AND CORONARY ANGIOGRAPHY; Location: [facility]; Surgeon: Lowanda Foster, MD   LEFT HEART CATH AND CORONARY ANGIOGRAPHY Left 07/08/2014   Procedure: LEFT HEART CATH AND CORONARY ANGIOGRAPHY; Location: [facility]; Surgeon: Isaias Cowman, MD   LOWER EXTREMITY ANGIOGRAM Left 04/11/2022   Procedure: LOWER EXTREMITY ANGIOGRAM;  Surgeon: Algernon Huxley, MD;  Location: [facility];  Service: Vascular;  Laterality: Left;   LOWER EXTREMITY ANGIOGRAPHY Right 09/25/2018   Procedure: LOWER EXTREMITY ANGIOGRAPHY;  Surgeon: Algernon Huxley, MD;  Location: [facility];  Service: Cardiovascular;  Laterality: Right;   LOWER EXTREMITY ANGIOGRAPHY Left 03/17/2020   Procedure: LOWER EXTREMITY ANGIOGRAPHY;  Surgeon: Algernon Huxley, MD;  Location: [place] [facility];  Service: Cardiovascular;  Laterality: Left;   LOWER EXTREMITY ANGIOGRAPHY Right 02/22/2022   Procedure: Lower Extremity Angiography;  Surgeon: Algernon Huxley, MD;  Location: [place] [facility];  Service: Cardiovascular;  Laterality: Right;   POLYPECTOMY N/A 01/18/2020   Procedure: POLYPECTOMY;  Surgeon: [name], MD;  Location: [facility];  Service: Endoscopy;  Laterality: N/A;   Family History  Problem Relation Age of Onset   Hypertension Mother    Diabetes Mother    Heart failure Father        age 82   Cancer Father  Ovarian cancer Sister    Heart disease Brother    Social History   Socioeconomic History   Marital status: Single    Spouse name: Not on file   Number of children: 0   Years of education: Not on file   Highest education level: 12th grade  Occupational History   Occupation: Retired/Disabled  Tobacco Use   Smoking status: Every Day    Packs/day: 0.50    Years: 46.00    Total pack years: 23.00    Types: Cigarettes   Smokeless tobacco: Never   Tobacco comments:    Pt down to 1/2 PPD 05/24/21  Vaping Use   Vaping Use: Never used  Substance and Sexual Activity   Alcohol use: Yes    Comment: quit drinking around 04/2019(May have drink 1x/mo)   Drug use: No   Sexual activity: Not Currently    Birth control/protection: None  Other Topics Concern   Not on file  Social History Narrative   Independent at baseline. Lives alone. Does not drive.    Social Determinants of Health   Financial Resource Strain: Low Risk  (03/21/2022)    Overall Financial Resource Strain (CARDIA)    Difficulty of Paying Living Expenses: Not hard at all  Food Insecurity: No Food Insecurity (04/11/2022)   Hunger Vital Sign    Worried About Running Out of Food in the Last Year: Never true    Ran Out of Food in the Last Year: Never true  Transportation Needs: No Transportation Needs (04/11/2022)   PRAPARE - Hydrologist (Medical): No    Lack of Transportation (Non-Medical): No  Physical Activity: Sufficiently Active (05/30/2022)   Exercise Vital Sign    Days of Exercise per Week: 7 days    Minutes of Exercise per Session: 60 min  Stress: No Stress Concern Present (05/30/2022)   [place]    Feeling of Stress : Not at all  Social Connections: Socially Isolated (05/30/2022)   Social Connection and Isolation Panel [NHANES]    Frequency of Communication with Friends and Family: More than three times a week    Frequency of Social Gatherings with Friends and Family: More than three times a week    Attends Religious Services: Never    Marine scientist or Organizations: No    Attends Archivist Meetings: Never    Marital Status: Never married    Tobacco Counseling Ready to quit: Not Answered Counseling given: Not Answered Tobacco comments: Pt down to 1/2 PPD 05/24/21   Clinical Intake:  Pre-visit preparation completed: Yes  Pain : No/denies pain Pain Score: 0-No pain     BMI - recorded: 25.6 Nutritional Status: BMI of 19-24  Normal Diabetes: No     Diabetic? No.     Information entered by :: Wyatt Haste, [place] of Daily Living    05/30/2022    1:21 PM 04/11/2022    5:19 PM  In your present state of health, do you have any difficulty performing the following activities:  Hearing? 0   Difficulty concentrating or making decisions? 0   Walking or climbing stairs? 0   Dressing or bathing? 0   Doing  errands, shopping? 0 0  Preparing Food and eating ? N   Using the Toilet? N   In the past six months, have you accidently leaked urine? N   Do you have problems with loss of bowel  control? N   Managing your Medications? N   Managing your Finances? N   Housekeeping or managing your Housekeeping? N     Patient Care Team: Glean Hess, MD as PCP - General (Internal Medicine) Isaias Cowman, MD as PCP - Cardiology (Cardiology) Hessie Knows, MD as Consulting Physician (Orthopedic Surgery) Lucky Cowboy Erskine Squibb, MD as Referring Physician (Vascular Surgery) Ottie Glazier, MD as Consulting Physician (Pulmonary Disease) Minor, Dalbert Garnet, RN (Inactive) as [place] any recent [place] you may have received from other than [facility] providers in the past year (date may be approximate).     Assessment:   This is a routine wellness examination for [place].  Hearing/Vision screen Hearing Screening - Comments:: No concerns. Vision Screening - Comments:: See's Dr Phylliss Bob - [facility]   Dietary issues and exercise activities discussed: Current Exercise Habits: Home exercise routine   Goals Addressed   None   Depression Screen    05/30/2022    1:21 PM 05/25/2022    1:44 PM 04/06/2022   10:03 AM 03/21/2022    3:43 PM 01/11/2022   10:19 AM 10/18/2021   11:18 AM 10/06/2021    1:55 PM  PHQ 2/9 Scores  PHQ - 2 Score 0 0 0 0 0 0 0  PHQ- 9 Score 0 0 0 0 0 0 0    Fall Risk    05/30/2022    1:21 PM 05/25/2022    1:44 PM 04/06/2022   10:03 AM 03/21/2022    3:43 PM 01/11/2022   10:19 AM  [place] in the past year? 1 1 0 1 0  Number falls in past yr: 1 0 0 0 0  Injury with Fall? 1 1 0 1 0  Risk for fall due to : History of fall(s) No Fall Risks No Fall Risks History of fall(s) No Fall Risks  Follow up Falls evaluation completed Falls evaluation completed Falls evaluation completed Falls evaluation completed Falls evaluation completed     [place]:  Any stairs in or around the home? No  Home free of loose throw rugs in walkways, pet beds, electrical cords, etc? Yes  Adequate lighting in your home to reduce risk of falls? Yes   ASSISTIVE DEVICES UTILIZED TO PREVENT FALLS:  Life alert? No  Use of a cane, walker or w/c? No  Grab bars in the bathroom? Yes  Shower chair or bench in shower? No  Elevated toilet seat or a handicapped toilet? No    Cognitive Function:        05/30/2022    1:22 PM 04/27/2020   11:35 AM 04/27/2019   11:41 AM 10/17/2017   10:54 AM  6CIT Screen  What Year? 0 points 0 points 0 points 0 points  What month? 0 points 0 points 0 points 0 points  What time? 0 points 0 points 0 points 0 points  Count back from 20 0 points 0 points 0 points 0 points  Months in reverse 0 points 0 points 0 points 0 points  Repeat phrase 0 points 0 points 0 points 2 points  Total Score 0 points 0 points 0 points 2 points    Immunizations Immunization History  Administered Date(s) Administered   Fluad Quad(high Dose 65+) 04/05/2021, 03/21/2022   Influenza,inj,Quad PF,6+ Mos 05/19/2015, 04/06/2016, 04/02/2017, 03/24/2018, 04/01/2019, 04/14/2020   PFIZER(Purple Top)SARS-COV-2 Vaccination 08/25/2019, 09/15/2019, 06/13/2020   PNEUMOCOCCAL CONJUGATE-20 04/05/2021   Pneumococcal Polysaccharide-23 11/14/2012,  07/03/2014   Tdap 12/18/2013    TDAP status: Up to date  Flu Vaccine status: Up to date  Pneumococcal vaccine status: Up to date  Covid-19 vaccine status: Completed vaccines  Qualifies for Shingles Vaccine? Yes   Zostavax completed No   Shingrix Completed?: No.    Education has been provided regarding the importance of this vaccine. Patient has been advised to call insurance company to determine out of pocket expense if they have not yet received this vaccine. Advised may also receive vaccine at local pharmacy or Health Dept. Verbalized acceptance and  understanding.  Screening Tests Health Maintenance  Topic Date Due   Zoster Vaccines- Shingrix (1 of 2) Never done   COVID-19 Vaccine (4 - 2023-24 season) 03/09/2022   Lung Cancer Screening  01/03/2023   Medicare Annual Wellness (AWV)  05/31/2023   COLONOSCOPY (Pts 45-19yr Insurance coverage will need to be confirmed)  01/17/2025   Pneumonia Vaccine 66 Years old  Completed   INFLUENZA VACCINE  Completed   Hepatitis C Screening  Completed   HPV VACCINES  Aged Out    Health Maintenance  Health Maintenance Due  Topic Date Due   Zoster Vaccines- Shingrix (1 of 2) Never done   COVID-19 Vaccine (4 - 2023-24 season) 03/09/2022    Colorectal cancer screening: Type of screening: Colonoscopy. Completed 01/08/20. Repeat every 5 years  Lung Cancer Screening: (Low Dose CT Chest recommended if Age 66-80years, 30 pack-year currently smoking OR have quit w/in 15years.) does qualify.   Lung Cancer Screening Referral: Patient see's LUNG doctor for these.  Additional Screening:  Hepatitis C Screening: does qualify; Completed 05/19/2015  Vision Screening: Recommended annual ophthalmology exams for early detection of glaucoma and other disorders of the eye. Is the patient up to date with their annual eye exam?  Yes  Who is the provider or what is the name of the office in which the patient attends annual eye exams? [facility]: Recommended annual dental exams for proper oral hygiene  Community Resource Referral / Chronic Care Management: CRR required this visit?  No   CCM required this visit?  No      Plan:     I have personally reviewed and noted the following in the patient's chart:   Medical and social history Use of alcohol, tobacco or illicit drugs  Current medications and supplements including opioid prescriptions. Patient is not currently taking opioid prescriptions. Functional ability and status Nutritional status Physical activity Advanced  directives List of other physicians Hospitalizations, surgeries, and ER visits in previous 12 months Vitals Screenings to include cognitive, depression, and falls Referrals and appointments  In addition, I have reviewed and discussed with patient certain preventive protocols, quality metrics, and best practice recommendations. A written personalized care plan for preventive services as well as general preventive health recommendations were provided to patient.     CClista Bernhardt CLyles  05/30/2022    Aaron [name] , Thank you for taking time to come for your Medicare Wellness Visit. I appreciate your ongoing commitment to your health goals. Please review the following plan we discussed and let me know if I can assist you in the future.   These are the goals we discussed:  Goals      DIET - INCREASE WATER INTAKE     Recommend to drink at least 6-8 8oz glasses of water per day.     Quit Smoking     Pt states he would like  to stop smoking this year        This is a list of the screening recommended for you and due dates:  Health Maintenance  Topic Date Due   Zoster (Shingles) Vaccine (1 of 2) Never done   COVID-19 Vaccine (4 - 2023-24 season) 03/09/2022   Screening for Lung Cancer  01/03/2023   Medicare Annual Wellness Visit  05/31/2023   Colon Cancer Screening  01/17/2025   Pneumonia Vaccine  Completed   Flu Shot  Completed   Hepatitis C Screening: USPSTF Recommendation to screen - Ages 18-79 yo.  Completed   HPV Vaccine  Aged Out     Nurse Notes: None.

## 2022-06-05 ENCOUNTER — Encounter (INDEPENDENT_AMBULATORY_CARE_PROVIDER_SITE_OTHER): Payer: Self-pay | Admitting: Nurse Practitioner

## 2022-06-05 ENCOUNTER — Ambulatory Visit (INDEPENDENT_AMBULATORY_CARE_PROVIDER_SITE_OTHER): Payer: PPO | Admitting: Nurse Practitioner

## 2022-06-05 VITALS — BP 131/63 | HR 68 | Resp 16 | Ht 73.0 in | Wt 199.0 lb

## 2022-06-05 DIAGNOSIS — I70211 Atherosclerosis of native arteries of extremities with intermittent claudication, right leg: Secondary | ICD-10-CM

## 2022-06-07 DIAGNOSIS — R55 Syncope and collapse: Secondary | ICD-10-CM | POA: Diagnosis not present

## 2022-06-11 ENCOUNTER — Encounter (INDEPENDENT_AMBULATORY_CARE_PROVIDER_SITE_OTHER): Payer: Self-pay | Admitting: Nurse Practitioner

## 2022-06-11 NOTE — Progress Notes (Signed)
Subjective:    Patient ID: JRUE JARRIEL, male    DOB: May 27, 1956, 66 y.o.   MRN: [id number] Chief Complaint  Patient presents with   Follow-up    3 week no studies     Aaron [name] is a 66 year old male who presented to [facility] on 04/11/2022 for a left femoral endarterectomy with SFA stent placement.    He has been working with PT and OT.  The patient notes that his activity is much improved prior to intervention.  The patient's groin site is continuing to heal well with some areas of scabbing but no evidence of infection.  There has been no dehiscence.    Review of Systems  Skin:  Positive for wound.  All other systems reviewed and are negative.      Objective:   Physical Exam Vitals reviewed.  HENT:     Head: Normocephalic.  Cardiovascular:     Rate and Rhythm: Normal rate.     Pulses:          Dorsalis pedis pulses are detected w/ Doppler on the right side and detected w/ Doppler on the left side.       Posterior tibial pulses are detected w/ Doppler on the right side and detected w/ Doppler on the left side.  Pulmonary:     Effort: Pulmonary effort is normal.  Skin:    General: Skin is warm and dry.  Neurological:     Mental Status: He is alert and oriented to person, place, and time.  Psychiatric:        Mood and Affect: Mood normal.        Behavior: Behavior normal.        Thought Content: Thought content normal.        Judgment: Judgment normal.     BP 131/63 (BP Location: Left Arm)   Pulse 68   Resp 16   Ht '6\' 1"'$  (1.854 m)   Wt 199 lb (90.3 kg)   BMI 26.25 kg/m   Past Medical History:  Diagnosis Date   Aortic atherosclerosis (HCC)    COPD (chronic obstructive pulmonary disease) (HCC)    Coronary artery disease 06/19/2010   a.) LHC/PCI (unknown date) --> stent to pRCA and pLAD (unknown type); b.) LHC 06/19/2010: 30 mLM, 60 mLAD, 60 pLCx, 50 pRI, 40 ISR pRCA, 50 dRCA, 50 RPDA, 50 RPLS --> med mgmt; c.) LHC 07/08/2014: 10  pLM, 30 dLAD, 75 oLCx, 30 mRCA, 40 RPDA, patent RCA stent --> med mgmt; d.) LHC 12/28/2021: 50 mRCA, 65 RPDA, 45 m-dRCA, 60 o-pRCA, 95 o-pLCx, 60 RI, 30 pLAD, 30 mLM --> med mgmt.   Diastolic dysfunction 59/56/3875   a.) TTE 09/16/2017: ED 55-60%, triv AR/MR, G1DD   Fracture of left ankle, closed, initial encounter 10/23/2017   GERD (gastroesophageal reflux disease)    Glaucoma    Gout    Hepatic steatosis    Hypercholesteremia    Hypertension    Long term current use of antithrombotics/antiplatelets    a.) cilostazol + full dose ASA + clopidogrel   PAD (peripheral artery disease) ([place])    a.) s/p multiple PTA procedures to lower extremities   Pain in limb 07/13/2016   Syncope 09/16/2017   Unstable angina ([place]) 01/12/2017   Vertigo     Social History   Socioeconomic History   Marital status: Single    Spouse name: Not on file   Number of children: 0   Years of education: Not  on file   Highest education level: 12th grade  Occupational History   Occupation: Retired/Disabled  Tobacco Use   Smoking status: Every Day    Packs/day: 0.50    Years: 46.00    Total pack years: 23.00    Types: Cigarettes   Smokeless tobacco: Never   Tobacco comments:    Pt down to 1/2 PPD 05/24/21  Vaping Use   Vaping Use: Never used  Substance and Sexual Activity   Alcohol use: Yes    Comment: quit drinking around 04/2019(May have drink 1x/mo)   Drug use: No   Sexual activity: Not Currently    Birth control/protection: None  Other Topics Concern   Not on file  Social History Narrative   Independent at baseline. Lives alone. Does not drive.    Social Determinants of Health   Financial Resource Strain: Low Risk  (03/21/2022)   Overall Financial Resource Strain (CARDIA)    Difficulty of Paying Living Expenses: Not hard at all  Food Insecurity: No Food Insecurity (04/11/2022)   Hunger Vital Sign    Worried About Running Out of Food in the Last Year: Never true    Ran Out of Food in the  Last Year: Never true  Transportation Needs: No Transportation Needs (04/11/2022)   PRAPARE - Hydrologist (Medical): No    Lack of Transportation (Non-Medical): No  Physical Activity: Sufficiently Active (05/30/2022)   Exercise Vital Sign    Days of Exercise per Week: 7 days    Minutes of Exercise per Session: 60 min  Stress: No Stress Concern Present (05/30/2022)   [place]    Feeling of Stress : Not at all  Social Connections: Socially Isolated (05/30/2022)   Social Connection and Isolation Panel [NHANES]    Frequency of Communication with Friends and Family: More than three times a week    Frequency of Social Gatherings with Friends and Family: More than three times a week    Attends Religious Services: Never    Marine scientist or Organizations: No    Attends Archivist Meetings: Never    Marital Status: Never married  Intimate Partner Violence: Not At Risk (04/11/2022)   Humiliation, Afraid, Rape, and Kick questionnaire    Fear of Current or Ex-Partner: No    Emotionally Abused: No    Physically Abused: No    Sexually Abused: No    Past Surgical History:  Procedure Laterality Date   APPENDECTOMY     CATARACT EXTRACTION W/PHACO Right 10/26/2021   Procedure: CATARACT EXTRACTION PHACO AND INTRAOCULAR LENS PLACEMENT ([place]) RIGHT OMIDRIA  10.98 01:18.2;  Surgeon: [name], MD;  Location: [place];  Service: Ophthalmology;  Laterality: Right;   CATARACT EXTRACTION W/PHACO Left 11/16/2021   Procedure: CATARACT EXTRACTION PHACO AND INTRAOCULAR LENS PLACEMENT ([place]) LEFT;  Surgeon: [name], MD;  Location: [place];  Service: Ophthalmology;  Laterality: Left;  6.91 0:46.0   COLONOSCOPY  03/30/2013   [place]   COLONOSCOPY WITH PROPOFOL N/A 12/10/2019   Procedure: COLONOSCOPY WITH PROPOFOL;  Surgeon: [name], MD;   Location: [place];  Service: Endoscopy;  Laterality: N/A;   COLONOSCOPY WITH PROPOFOL N/A 01/18/2020   Procedure: COLONOSCOPY WITH PROPOFOL;  Surgeon: [name], MD;  Location: [place];  Service: Endoscopy;  Laterality: N/A;  4   CORONARY ANGIOPLASTY WITH STENT PLACEMENT Left    Procedure: CORONARY ANGIOPLASTY WITH STENT  PLACEMENT   ENDARTERECTOMY FEMORAL Left 04/11/2022   Procedure: ENDARTERECTOMY FEMORAL ( SFA & TIBIAL); insertion of SFA stent;  Surgeon: Algernon Huxley, MD;  Location: [facility];  Service: Vascular;  Laterality: Left;   LEFT HEART CATH AND CORONARY ANGIOGRAPHY N/A 12/29/2020   Procedure: LEFT HEART CATH AND CORONARY ANGIOGRAPHY;  Surgeon: Corey Skains, MD;  Location: [place] [facility];  Service: Cardiovascular;  Laterality: N/A;   LEFT HEART CATH AND CORONARY ANGIOGRAPHY Left 06/19/2010   Procedure: LEFT HEART CATH AND CORONARY ANGIOGRAPHY; Location: [place]; Surgeon: Lowanda Foster, MD   LEFT HEART CATH AND CORONARY ANGIOGRAPHY Left 07/08/2014   Procedure: LEFT HEART CATH AND CORONARY ANGIOGRAPHY; Location: [place]; Surgeon: Isaias Cowman, MD   LOWER EXTREMITY ANGIOGRAM Left 04/11/2022   Procedure: LOWER EXTREMITY ANGIOGRAM;  Surgeon: Algernon Huxley, MD;  Location: [facility];  Service: Vascular;  Laterality: Left;   LOWER EXTREMITY ANGIOGRAPHY Right 09/25/2018   Procedure: LOWER EXTREMITY ANGIOGRAPHY;  Surgeon: Algernon Huxley, MD;  Location: [place] [facility];  Service: Cardiovascular;  Laterality: Right;   LOWER EXTREMITY ANGIOGRAPHY Left 03/17/2020   Procedure: LOWER EXTREMITY ANGIOGRAPHY;  Surgeon: Algernon Huxley, MD;  Location: [place] [facility];  Service: Cardiovascular;  Laterality: Left;   LOWER EXTREMITY ANGIOGRAPHY Right 02/22/2022   Procedure: Lower Extremity Angiography;  Surgeon: Algernon Huxley, MD;  Location: [place] [facility];  Service: Cardiovascular;  Laterality: Right;   POLYPECTOMY N/A 01/18/2020   Procedure: POLYPECTOMY;   Surgeon: [name], MD;  Location: [place];  Service: Endoscopy;  Laterality: N/A;    Family History  Problem Relation Age of Onset   Hypertension Mother    Diabetes Mother    Heart failure Father        age 17   Cancer Father    Ovarian cancer Sister    Heart disease Brother     Allergies  Allergen Reactions   Contrast Media [Iodinated Contrast Media] Hives   Iodine Hives       Latest Ref Rng & Units 04/15/2022    5:09 PM 04/12/2022   12:27 AM 04/11/2022    7:26 AM  CBC  WBC 4.0 - 10.5 K/uL 7.2  9.8  7.6   Hemoglobin 13.0 - 17.0 g/dL 12.9  13.1  16.1   Hematocrit 39.0 - 52.0 % 39.3  39.1  48.6   Platelets 150 - 400 K/uL 222  204  244       CMP     Component Value Date/Time   NA 136 04/15/2022 1709   NA 137 04/06/2022 1049   NA 137 07/02/2014 0513   K 3.8 04/15/2022 1709   K 4.0 07/02/2014 0513   CL 99 04/15/2022 1709   CL 103 07/02/2014 0513   CO2 30 04/15/2022 1709   CO2 26 07/02/2014 0513   GLUCOSE 132 (H) 04/15/2022 1709   GLUCOSE 109 (H) 07/02/2014 0513   BUN 18 04/15/2022 1709   BUN 19 04/06/2022 1049   BUN 15 07/02/2014 0513   CREATININE 0.93 04/15/2022 1709   CREATININE 0.81 07/02/2014 0513   CALCIUM 8.8 (L) 04/15/2022 1709   CALCIUM 8.7 07/02/2014 0513   PROT 7.1 04/06/2022 1049   PROT 7.9 12/03/2011 2127   ALBUMIN 4.5 04/06/2022 1049   ALBUMIN 4.1 12/03/2011 2127   AST 22 04/06/2022 1049   AST 23 12/03/2011 2127   ALT 28 04/06/2022 1049   ALT 22 12/03/2011 2127   ALKPHOS 84 04/06/2022 1049  ALKPHOS 90 12/03/2011 2127   BILITOT 0.4 04/06/2022 1049   BILITOT 0.2 12/03/2011 2127   GFRNONAA >60 04/15/2022 1709   GFRNONAA >60 07/02/2014 0513   GFRNONAA >60 11/07/2012 0442   GFRAA >60 03/17/2020 0829   GFRAA >60 07/02/2014 0513   GFRAA >60 11/07/2012 0442     VAS [place] ABI WITH/WO TBI  Result Date: 05/13/2022  LOWER EXTREMITY DOPPLER STUDY Patient Name:  Aaron [name]  Date of Exam:   05/11/2022 Medical Rec #: [id number]          Accession #:    [id number] Date of Birth: [birth year]         Patient Gender: M Patient Age:   71 years Exam Location:  [facility] Procedure:      VAS [place] ABI WITH/WO TBI Referring Phys: [name] --------------------------------------------------------------------------------  Indications: Peripheral artery disease. High Risk Factors: Hypertension, hyperlipidemia, current smoker.  Vascular Interventions: 04/11/2022 Endarterectomy femoral and tibial , left SFA                         stent                         09/25/2018: Right CIA & SFA stents with EIA & ATA PTAs;                         Left CIA/EIA stent;                         03/17/2020: Left SFA/popliteal stent;                         02/22/2022 rt sfa pop PTA and stents. Performing Technologist: Delorise Shiner RVT  Examination Guidelines: A complete evaluation includes at minimum, Doppler waveform signals and systolic blood pressure reading at the level of bilateral brachial, anterior tibial, and posterior tibial arteries, when vessel segments are accessible. Bilateral testing is considered an integral part of a complete examination. Photoelectric Plethysmograph (PPG) waveforms and toe systolic pressure readings are included as required and additional duplex testing as needed. Limited examinations for reoccurring indications may be performed as noted.  ABI Findings: +---------+------------------+-----+---------+--------+ Right    Rt Pressure (mmHg)IndexWaveform Comment  +---------+------------------+-----+---------+--------+ Brachial 144                                      +---------+------------------+-----+---------+--------+ PTA      129               0.90 biphasic          +---------+------------------+-----+---------+--------+ DP       131               0.91 triphasic         +---------+------------------+-----+---------+--------+ Great Toe91                0.63                    +---------+------------------+-----+---------+--------+ +---------+------------------+-----+---------+-------+ Left     Lt Pressure (mmHg)IndexWaveform Comment +---------+------------------+-----+---------+-------+ Brachial 136                                     +---------+------------------+-----+---------+-------+  PTA      128               0.89 triphasic        +---------+------------------+-----+---------+-------+ DP       137               0.95 biphasic         +---------+------------------+-----+---------+-------+ Great Toe78                0.54                  +---------+------------------+-----+---------+-------+ +-------+-----------+-----------+------------+------------+ ABI/TBIToday's ABIToday's TBIPrevious ABIPrevious TBI +-------+-----------+-----------+------------+------------+ Right  0.91       0.63       0.96        0.78         +-------+-----------+-----------+------------+------------+ Left   0.95       0.54       0.82        0.60         +-------+-----------+-----------+------------+------------+ Right ABIs appear essentially unchanged compared to prior study on 03/20/2022. Left ABIs appear increased compared to prior study on 03/20/2022.  Summary: Right: Resting right ankle-brachial index indicates mild right lower extremity arterial disease. The right toe-brachial index is abnormal. Left: Resting left ankle-brachial index is within normal range. The left toe-brachial index is abnormal. *See table(s) above for measurements and observations.  Electronically signed by [name] MD on 05/13/2022 at 7:17:41 PM.    Final        Assessment & Plan:   1. Atherosclerosis of native artery of right lower extremity with intermittent claudication [facility]) Patient is doing well postintervention.  He continues to have wound although scabbed over.  Patient will continue with his antiplatelets.  Currently no evidence of infection.  We will have patient return  in 3 months or sooner if issues arise   Current Outpatient Medications on File Prior to Visit  Medication Sig Dispense Refill   albuterol (VENTOLIN HFA) 108 (90 Base) MCG/ACT inhaler Inhale 2 puffs into the lungs every 6 (six) hours as needed for wheezing or shortness of breath. 6.7 g 5   aspirin 325 MG tablet Take 325 mg by mouth daily.     cilostazol (PLETAL) 50 MG tablet Take by mouth 2 (two) times daily.      clopidogrel (PLAVIX) 75 MG tablet Take 1 tablet by mouth daily.     docusate sodium (COLACE) 100 MG capsule Take 1 capsule (100 mg total) by mouth daily. 10 capsule 0   indomethacin (INDOCIN) 25 MG capsule Take 1 capsule (25 mg total) by mouth 2 (two) times daily as needed. 30 capsule 1   isosorbide mononitrate (IMDUR) 120 MG 24 hr tablet Take 1 tablet (120 mg total) by mouth daily. 30 tablet 0   lisinopril (PRINIVIL,ZESTRIL) 10 MG tablet Take 1 tablet by mouth daily.     metoprolol tartrate (LOPRESSOR) 50 MG tablet Take 50-75 mg by mouth 2 (two) times a day. Take '50MG'$  by mouth every morning and '75MG'$  every evening     nitroGLYCERIN (NITROSTAT) 0.4 MG SL tablet Place 1 tablet under the tongue as needed.     senna-docusate (SENOKOT-S) 8.6-50 MG tablet Take 1 tablet by mouth at bedtime as needed for mild constipation. 30 tablet 0   simvastatin (ZOCOR) 40 MG tablet Take 1 tablet (40 mg total) by mouth at bedtime. 30 tablet 5   No current facility-administered medications on file prior to visit.    There  are no Patient Instructions on file for this visit. No follow-ups on file.   Kris Hartmann, NP

## 2022-06-12 DIAGNOSIS — H59099 Other disorders of unspecified eye following cataract surgery: Secondary | ICD-10-CM | POA: Diagnosis not present

## 2022-06-19 DIAGNOSIS — F17200 Nicotine dependence, unspecified, uncomplicated: Secondary | ICD-10-CM | POA: Diagnosis not present

## 2022-06-19 DIAGNOSIS — E7849 Other hyperlipidemia: Secondary | ICD-10-CM | POA: Diagnosis not present

## 2022-06-19 DIAGNOSIS — I2510 Atherosclerotic heart disease of native coronary artery without angina pectoris: Secondary | ICD-10-CM | POA: Diagnosis not present

## 2022-06-19 DIAGNOSIS — I739 Peripheral vascular disease, unspecified: Secondary | ICD-10-CM | POA: Diagnosis not present

## 2022-06-19 DIAGNOSIS — I10 Essential (primary) hypertension: Secondary | ICD-10-CM | POA: Diagnosis not present

## 2022-06-27 ENCOUNTER — Ambulatory Visit: Payer: Self-pay | Admitting: *Deleted

## 2022-06-27 NOTE — Telephone Encounter (Signed)
  Chief Complaint: low abdominal pain  Symptoms: low abdominal pain with bloating. Loose BMs x 2 today, reports woke up with pain. Level 8 on 0-10 scale. Radiates to chest  Frequency: woke up with pain yesterday  Pertinent Negatives: Patient denies chest pain no difficulty breathing no fever. No constant pain  Disposition: '[]'$ ED /'[]'$ Urgent Care (no appt availability in office) / '[x]'$ Appointment(In office/virtual)/ '[]'$  [facility]/ '[]'$ Home Care/ '[]'$ Refused Recommended Disposition /'[]'$ [facility]/ '[]'$  Follow-up with PCP Additional Notes:   Appt scheduled for 06/28/22. Recommended if pain worse and constant go to ED .    Reason for Disposition  [1] MODERATE pain (e.g., interferes with normal activities) AND [2] pain comes and goes (cramps) AND [3] present > 24 hours  (Exception: Pain with Vomiting or Diarrhea - see that Guideline.)  Answer Assessment - Initial Assessment Questions 1. LOCATION: "Where does it hurt?"      Low abdominal  2. RADIATION: "Does the pain shoot anywhere else?" (e.g., chest, back)     Up toward chest  3. ONSET: "When did the pain begin?" (Minutes, hours or days ago)      Yesterday  4. SUDDEN: "Gradual or sudden onset?"     Woke up with pain  5. PATTERN "Does the pain come and go, or is it constant?"    - If it comes and goes: "How long does it last?" "Do you have pain now?"     (Note: Comes and goes means the pain is intermittent. It goes away completely between bouts.)    - If constant: "Is it getting better, staying the same, or getting worse?"      (Note: Constant means the pain never goes away completely; most serious pain is constant and gets worse.)      Comes and goes  6. SEVERITY: "How bad is the pain?"  (e.g., Scale 1-10; mild, moderate, or severe)    - MILD (1-3): Doesn't interfere with normal activities, abdomen soft and not tender to touch.     - MODERATE (4-7): Interferes with normal activities or awakens from sleep, abdomen tender to  touch.     - SEVERE (8-10): Excruciating pain, doubled over, unable to do any normal activities.       Abdomen bloated level 8 pain  7. RECURRENT SYMPTOM: "Have you ever had this type of stomach pain before?" If Yes, ask: "When was the last time?" and "What happened that time?"      Na  8. CAUSE: "What do you think is causing the stomach pain?"     Not sure  9. RELIEVING/AGGRAVATING FACTORS: "What makes it better or worse?" (e.g., antacids, bending or twisting motion, bowel movement)     Took medication  10. OTHER SYMPTOMS: "Do you have any other symptoms?" (e.g., back pain, diarrhea, fever, urination pain, vomiting)       Loose BMs x 2 today. Abdomen bloated. No N/V no fever  Protocols used: Abdominal Pain - Male-A-AH

## 2022-06-28 ENCOUNTER — Encounter: Payer: Self-pay | Admitting: Internal Medicine

## 2022-06-28 ENCOUNTER — Ambulatory Visit (INDEPENDENT_AMBULATORY_CARE_PROVIDER_SITE_OTHER): Payer: PPO | Admitting: Internal Medicine

## 2022-06-28 VITALS — BP 126/68 | HR 69 | Ht 73.0 in | Wt 193.2 lb

## 2022-06-28 DIAGNOSIS — S39011A Strain of muscle, fascia and tendon of abdomen, initial encounter: Secondary | ICD-10-CM

## 2022-06-28 DIAGNOSIS — R197 Diarrhea, unspecified: Secondary | ICD-10-CM

## 2022-06-28 DIAGNOSIS — L409 Psoriasis, unspecified: Secondary | ICD-10-CM | POA: Diagnosis not present

## 2022-06-28 MED ORDER — BETAMETHASONE VALERATE 0.1 % EX CREA: TOPICAL_CREAM | Freq: Two times a day (BID) | CUTANEOUS | 0 refills | Status: DC

## 2022-06-28 NOTE — Assessment & Plan Note (Signed)
seen by Dr. Milford Cage prescribed Betamethasone Valerate cream 0.1 %

## 2022-06-28 NOTE — Patient Instructions (Signed)
Cut back on Metamucil or stop it completely according to bowel habits.

## 2022-06-28 NOTE — Progress Notes (Signed)
Date:  06/28/2022   Name:  Aaron [name]   DOB:  1956/04/18   MRN:  [id number]   Chief Complaint: Abdominal Pain  Abdominal Pain This is a new problem. Episode onset: started 4 days ago after moving some furniture at home. The onset quality is undetermined. The problem occurs daily. The problem has been unchanged. The pain is located in the epigastric region and periumbilical region. The pain is mild. The quality of the pain is aching and cramping. The abdominal pain does not radiate. Associated symptoms include diarrhea. Pertinent negatives include no fever, hematochezia, melena, nausea, vomiting or weight loss.  Diarrhea  This is a new problem. The current episode started in the past 7 days. The problem occurs less than 2 times per day. The stool consistency is described as Watery. Associated symptoms include abdominal pain. Pertinent negatives include no chills, coughing, fever, vomiting or weight loss.  Rash This is a recurrent problem. The affected locations include the torso. The rash is characterized by redness, peeling and itchiness. Associated symptoms include diarrhea. Pertinent negatives include no cough, fatigue, fever, shortness of breath or vomiting. Past treatments include topical steroids (prescribed by Dermatology).  He recently underwent vascular procedure in the left groin.  He still has 2 scabs adherent which are uncomfortable.  Lab Results  Component Value Date   NA 136 04/15/2022   K 3.8 04/15/2022   CO2 30 04/15/2022   GLUCOSE 132 (H) 04/15/2022   BUN 18 04/15/2022   CREATININE 0.93 04/15/2022   CALCIUM 8.8 (L) 04/15/2022   EGFR 91 04/06/2022   GFRNONAA >60 04/15/2022   Lab Results  Component Value Date   CHOL 144 04/06/2022   HDL 39 (L) 04/06/2022   LDLCALC 78 04/06/2022   TRIG 155 (H) 04/06/2022   CHOLHDL 3.7 04/06/2022   Lab Results  Component Value Date   TSH 3.214 02/06/2019   Lab Results  Component Value Date   HGBA1C 6.0 (H) 04/06/2022    Lab Results  Component Value Date   WBC 7.2 04/15/2022   HGB 12.9 (L) 04/15/2022   HCT 39.3 04/15/2022   MCV 97.0 04/15/2022   PLT 222 04/15/2022   Lab Results  Component Value Date   ALT 28 04/06/2022   AST 22 04/06/2022   ALKPHOS 84 04/06/2022   BILITOT 0.4 04/06/2022   No results found for: "25OHVITD2", "25OHVITD3", "VD25OH"   Review of Systems  Constitutional:  Negative for chills, fatigue, fever and weight loss.  Respiratory:  Negative for cough, chest tightness and shortness of breath.   Cardiovascular:  Negative for chest pain.  Gastrointestinal:  Positive for abdominal pain and diarrhea. Negative for hematochezia, melena, nausea and vomiting.  Skin:  Positive for rash.  Psychiatric/Behavioral:  Negative for dysphoric mood and sleep disturbance. The patient is not nervous/anxious.     Patient Active Problem List   Diagnosis Date Noted   Atherosclerosis of artery of extremity with intermittent claudication ([place]) 04/11/2022   Hepatic steatosis 01/04/2022   Aortic atherosclerosis ([place]) 07/10/2020   Stage 2 moderate COPD by GOLD classification ([place]) 05/16/2020   Dyspepsia and disorder of function of stomach 05/16/2020   Cervical disc disorder of mid-cervical region 05/10/2020   Polyp of descending colon    Mixed hyperlipidemia 10/28/2018   Atherosclerosis of native arteries of extremity with intermittent claudication ([place]) 07/13/2016   Tobacco use disorder 07/13/2016   Prediabetes 07/27/2015   Controlled gout 12/22/2014   Coronary artery disease involving native coronary artery of  native heart with angina pectoris ([place]) 10/09/2014   Essential (primary) hypertension 10/09/2014   Dupuytren's disease of palm 10/09/2014   Psoriasis 10/09/2014   H/O cardiac catheterization 01/27/2014   Peripheral vascular disease ([place]) 01/27/2014    Allergies  Allergen Reactions   Contrast Media [Iodinated Contrast Media] Hives   Iodine Hives    Past Surgical History:   Procedure Laterality Date   APPENDECTOMY     CATARACT EXTRACTION W/PHACO Right 10/26/2021   Procedure: CATARACT EXTRACTION PHACO AND INTRAOCULAR LENS PLACEMENT ([place]) RIGHT OMIDRIA  10.98 01:18.2;  Surgeon: [name], MD;  Location: [place];  Service: Ophthalmology;  Laterality: Right;   CATARACT EXTRACTION W/PHACO Left 11/16/2021   Procedure: CATARACT EXTRACTION PHACO AND INTRAOCULAR LENS PLACEMENT ([place]) LEFT;  Surgeon: [name], MD;  Location: [place];  Service: Ophthalmology;  Laterality: Left;  6.91 0:46.0   COLONOSCOPY  03/30/2013   [place]   COLONOSCOPY WITH PROPOFOL N/A 12/10/2019   Procedure: COLONOSCOPY WITH PROPOFOL;  Surgeon: [name], MD;  Location: [place];  Service: Endoscopy;  Laterality: N/A;   COLONOSCOPY WITH PROPOFOL N/A 01/18/2020   Procedure: COLONOSCOPY WITH PROPOFOL;  Surgeon: [name], MD;  Location: [place];  Service: Endoscopy;  Laterality: N/A;  4   CORONARY ANGIOPLASTY WITH STENT PLACEMENT Left    Procedure: CORONARY ANGIOPLASTY WITH STENT PLACEMENT   ENDARTERECTOMY FEMORAL Left 04/11/2022   Procedure: ENDARTERECTOMY FEMORAL ( SFA & TIBIAL); insertion of SFA stent;  Surgeon: Algernon Huxley, MD;  Location: [facility];  Service: Vascular;  Laterality: Left;   LEFT HEART CATH AND CORONARY ANGIOGRAPHY N/A 12/29/2020   Procedure: LEFT HEART CATH AND CORONARY ANGIOGRAPHY;  Surgeon: Corey Skains, MD;  Location: [place] [facility];  Service: Cardiovascular;  Laterality: N/A;   LEFT HEART CATH AND CORONARY ANGIOGRAPHY Left 06/19/2010   Procedure: LEFT HEART CATH AND CORONARY ANGIOGRAPHY; Location: [place]; Surgeon: Lowanda Foster, MD   LEFT HEART CATH AND CORONARY ANGIOGRAPHY Left 07/08/2014   Procedure: LEFT HEART CATH AND CORONARY ANGIOGRAPHY; Location: [place]; Surgeon: Isaias Cowman, MD   LOWER EXTREMITY ANGIOGRAM Left 04/11/2022   Procedure: LOWER EXTREMITY ANGIOGRAM;  Surgeon: Algernon Huxley, MD;  Location: [facility];  Service: Vascular;  Laterality: Left;   LOWER EXTREMITY ANGIOGRAPHY Right 09/25/2018   Procedure: LOWER EXTREMITY ANGIOGRAPHY;  Surgeon: Algernon Huxley, MD;  Location: [place] [facility];  Service: Cardiovascular;  Laterality: Right;   LOWER EXTREMITY ANGIOGRAPHY Left 03/17/2020   Procedure: LOWER EXTREMITY ANGIOGRAPHY;  Surgeon: Algernon Huxley, MD;  Location: [place] [facility];  Service: Cardiovascular;  Laterality: Left;   LOWER EXTREMITY ANGIOGRAPHY Right 02/22/2022   Procedure: Lower Extremity Angiography;  Surgeon: Algernon Huxley, MD;  Location: [place] [facility];  Service: Cardiovascular;  Laterality: Right;   POLYPECTOMY N/A 01/18/2020   Procedure: POLYPECTOMY;  Surgeon: [name], MD;  Location: [place];  Service: Endoscopy;  Laterality: N/A;    Social History   Tobacco Use   Smoking status: Every Day    Packs/day: 0.50    Years: 46.00    Total pack years: 23.00    Types: Cigarettes   Smokeless tobacco: Never   Tobacco comments:    Pt down to 1/2 PPD 05/24/21  Vaping Use   Vaping Use: Never used  Substance Use Topics   Alcohol use: Yes    Comment: quit drinking around 04/2019(May have drink 1x/mo)   Drug use: No     Medication  list has been reviewed and updated.  Current Meds  Medication Sig   albuterol (VENTOLIN HFA) 108 (90 Base) MCG/ACT inhaler Inhale 2 puffs into the lungs every 6 (six) hours as needed for wheezing or shortness of breath.   aspirin 325 MG tablet Take 325 mg by mouth daily.   betamethasone valerate (VALISONE) 0.1 % cream Apply topically 2 (two) times daily. To rash on buttocks   cilostazol (PLETAL) 50 MG tablet Take by mouth 2 (two) times daily.    clopidogrel (PLAVIX) 75 MG tablet Take 1 tablet by mouth daily.   indomethacin (INDOCIN) 25 MG capsule Take 1 capsule (25 mg total) by mouth 2 (two) times daily as needed.   isosorbide mononitrate (IMDUR) 120 MG 24 hr tablet Take 1 tablet (120 mg total)  by mouth daily.   lisinopril (PRINIVIL,ZESTRIL) 10 MG tablet Take 1 tablet by mouth daily.   metoprolol tartrate (LOPRESSOR) 50 MG tablet Take 50-75 mg by mouth 2 (two) times a day. Take 50MG by mouth every morning and 75MG every evening   nitroGLYCERIN (NITROSTAT) 0.4 MG SL tablet Place 1 tablet under the tongue as needed.   psyllium (METAMUCIL) 58.6 % packet Take 1 packet by mouth daily.   simvastatin (ZOCOR) 40 MG tablet Take 1 tablet (40 mg total) by mouth at bedtime.   [DISCONTINUED] docusate sodium (COLACE) 100 MG capsule Take 1 capsule (100 mg total) by mouth daily.   [DISCONTINUED] senna-docusate (SENOKOT-S) 8.6-50 MG tablet Take 1 tablet by mouth at bedtime as needed for mild constipation.       06/28/2022   10:52 AM 05/25/2022    1:44 PM 04/06/2022   10:03 AM 03/21/2022    3:43 PM  GAD 7 : Generalized Anxiety Score  Nervous, Anxious, on Edge 0 0 0 0  Control/stop worrying 0 0 0 0  Worry too much - different things 0 0 0 0  Trouble relaxing 0 0 0 0  Restless 0 0 0 0  Easily annoyed or irritable 0 0 0 0  Afraid - awful might happen 0 0 0 0  Total GAD 7 Score 0 0 0 0  Anxiety Difficulty Not difficult at all Not difficult at all Not difficult at all Not difficult at all       06/28/2022   10:52 AM 05/30/2022    1:21 PM 05/25/2022    1:44 PM  Depression screen PHQ 2/9  Decreased Interest 0 0 0  Down, Depressed, Hopeless 0 0 0  PHQ - 2 Score 0 0 0  Altered sleeping 0 0 0  Tired, decreased energy 0 0 0  Change in appetite 0 0 0  Feeling bad or failure about yourself  0 0 0  Trouble concentrating 0 0 0  Moving slowly or fidgety/restless 0 0 0  Suicidal thoughts 0 0 0  PHQ-9 Score 0 0 0  Difficult doing work/chores Not difficult at all Not difficult at all Not difficult at all    BP Readings from Last 3 Encounters:  06/28/22 126/68  06/05/22 131/63  05/25/22 118/70    Physical Exam Constitutional:      Appearance: He is well-developed.  Cardiovascular:      Rate and Rhythm: Normal rate and regular rhythm.  Pulmonary:     Effort: Pulmonary effort is normal.     Breath sounds: No wheezing or rhonchi.  Abdominal:     General: Abdomen is protuberant. Bowel sounds are normal.     Palpations: Abdomen is soft.  Tenderness: There is abdominal tenderness (with pressure on abdominal muscles). There is rebound. There is no guarding.     Comments: Left groin surgical site healed Small eschar peeled off without pain Larger eschar proximally is very adherent to the scar and tender to manipulation - will allow this to slough off on its own  Skin:    General: Skin is warm and dry.     Findings: No rash (recurrent psoriatic rash on buttocks).  Neurological:     Mental Status: He is alert.     Wt Readings from Last 3 Encounters:  06/28/22 193 lb 3.2 oz (87.6 kg)  06/05/22 199 lb (90.3 kg)  05/25/22 194 lb (88 kg)    BP 126/68   Pulse 69   Ht _0  (1.854 m)   Wt 193 lb 3.2 oz (87.6 kg)   SpO2 98%   BMI 25.49 kg/m   Assessment and Plan: Problem List Items Addressed This Visit       Musculoskeletal and Integument   Psoriasis (Chronic)    seen by Dr. Milford Cage prescribed Betamethasone Valerate cream 0.1 %       Relevant Medications   betamethasone valerate (VALISONE) 0.1 % cream   Other Visit Diagnoses     Strain of abdominal muscle, initial encounter    -  Primary   from furniture moving reassured - monitor for worsening   Diarrhea, unspecified type       after overtreatment for constipation recommend stopping Metamucil        Partially dictated using Editor, commissioning. Any errors are unintentional.  Halina Maidens, MD [facility]  06/28/2022

## 2022-07-19 DIAGNOSIS — H26491 Other secondary cataract, right eye: Secondary | ICD-10-CM | POA: Diagnosis not present

## 2022-08-01 ENCOUNTER — Ambulatory Visit (INDEPENDENT_AMBULATORY_CARE_PROVIDER_SITE_OTHER): Payer: Medicare HMO | Admitting: Internal Medicine

## 2022-08-01 ENCOUNTER — Encounter: Payer: Self-pay | Admitting: Internal Medicine

## 2022-08-01 ENCOUNTER — Other Ambulatory Visit (INDEPENDENT_AMBULATORY_CARE_PROVIDER_SITE_OTHER): Payer: Self-pay | Admitting: Nurse Practitioner

## 2022-08-01 VITALS — BP 118/60 | HR 74 | Ht 73.0 in | Wt 192.4 lb

## 2022-08-01 DIAGNOSIS — M720 Palmar fascial fibromatosis [Dupuytren]: Secondary | ICD-10-CM | POA: Diagnosis not present

## 2022-08-01 DIAGNOSIS — Z9889 Other specified postprocedural states: Secondary | ICD-10-CM

## 2022-08-01 NOTE — Progress Notes (Signed)
Date:  08/01/2022   Name:  Aaron [name]   DOB:  13-Dec-1955   MRN:  [id number]   Chief Complaint: Hand Pain (Left)  Hand Pain  The incident occurred 3 to 5 days ago. The incident occurred at home. Injury mechanism: severe left hand pain after pushing up from bed. The pain is present in the left hand. The quality of the pain is described as burning and cramping. The pain does not radiate. The pain is moderate. The pain has been Fluctuating since the incident. Pertinent negatives include no chest pain.  He has contractures in the 4th and 5th fingers that have  been progressing for the past 5+ years.  He was told years ago that he will eventually need surgery.  Until recently he was not bothered too much.  However the pain has been worse and he is having more trouble gripping.  Lab Results  Component Value Date   NA 136 04/15/2022   K 3.8 04/15/2022   CO2 30 04/15/2022   GLUCOSE 132 (H) 04/15/2022   BUN 18 04/15/2022   CREATININE 0.93 04/15/2022   CALCIUM 8.8 (L) 04/15/2022   EGFR 91 04/06/2022   GFRNONAA >60 04/15/2022   Lab Results  Component Value Date   CHOL 144 04/06/2022   HDL 39 (L) 04/06/2022   LDLCALC 78 04/06/2022   TRIG 155 (H) 04/06/2022   CHOLHDL 3.7 04/06/2022   Lab Results  Component Value Date   TSH 3.214 02/06/2019   Lab Results  Component Value Date   HGBA1C 6.0 (H) 04/06/2022   Lab Results  Component Value Date   WBC 7.2 04/15/2022   HGB 12.9 (L) 04/15/2022   HCT 39.3 04/15/2022   MCV 97.0 04/15/2022   PLT 222 04/15/2022   Lab Results  Component Value Date   ALT 28 04/06/2022   AST 22 04/06/2022   ALKPHOS 84 04/06/2022   BILITOT 0.4 04/06/2022   No results found for: "25OHVITD2", "25OHVITD3", "VD25OH"   Review of Systems  Constitutional:  Negative for chills and fatigue.  Respiratory:  Negative for chest tightness.   Cardiovascular:  Negative for chest pain.  Musculoskeletal:  Positive for arthralgias (both hands, left > right).     Patient Active Problem List   Diagnosis Date Noted   Atherosclerosis of artery of extremity with intermittent claudication ([place]) 04/11/2022   Hepatic steatosis 01/04/2022   Aortic atherosclerosis ([place]) 07/10/2020   Stage 2 moderate COPD by GOLD classification ([place]) 05/16/2020   Dyspepsia and disorder of function of stomach 05/16/2020   Cervical disc disorder of mid-cervical region 05/10/2020   Polyp of descending colon    Mixed hyperlipidemia 10/28/2018   Atherosclerosis of native arteries of extremity with intermittent claudication ([place]) 07/13/2016   Tobacco use disorder 07/13/2016   Prediabetes 07/27/2015   Controlled gout 12/22/2014   Coronary artery disease involving native coronary artery of native heart with angina pectoris ([place]) 10/09/2014   Essential (primary) hypertension 10/09/2014   Palmar fascial fibromatosis (dupuytren) 10/09/2014   Psoriasis 10/09/2014   H/O cardiac catheterization 01/27/2014   Peripheral vascular disease ([place]) 01/27/2014    Allergies  Allergen Reactions   Contrast Media [Iodinated Contrast Media] Hives   Iodine Hives    Past Surgical History:  Procedure Laterality Date   APPENDECTOMY     CATARACT EXTRACTION W/PHACO Right 10/26/2021   Procedure: CATARACT EXTRACTION PHACO AND INTRAOCULAR LENS PLACEMENT ([place]) RIGHT OMIDRIA  10.98 01:18.2;  Surgeon: [name], MD;  Location: [facility];  Service: Ophthalmology;  Laterality: Right;   CATARACT EXTRACTION W/PHACO Left 11/16/2021   Procedure: CATARACT EXTRACTION PHACO AND INTRAOCULAR LENS PLACEMENT ([place]) LEFT;  Surgeon: [name], MD;  Location: [place];  Service: Ophthalmology;  Laterality: Left;  6.91 0:46.0   COLONOSCOPY  03/30/2013   [place]   COLONOSCOPY WITH PROPOFOL N/A 12/10/2019   Procedure: COLONOSCOPY WITH PROPOFOL;  Surgeon: [name], MD;  Location: [place];  Service: Endoscopy;  Laterality: N/A;   COLONOSCOPY WITH  PROPOFOL N/A 01/18/2020   Procedure: COLONOSCOPY WITH PROPOFOL;  Surgeon: [name], MD;  Location: [place];  Service: Endoscopy;  Laterality: N/A;  4   CORONARY ANGIOPLASTY WITH STENT PLACEMENT Left    Procedure: CORONARY ANGIOPLASTY WITH STENT PLACEMENT   ENDARTERECTOMY FEMORAL Left 04/11/2022   Procedure: ENDARTERECTOMY FEMORAL ( SFA & TIBIAL); insertion of SFA stent;  Surgeon: Algernon Huxley, MD;  Location: [facility];  Service: Vascular;  Laterality: Left;   LEFT HEART CATH AND CORONARY ANGIOGRAPHY N/A 12/29/2020   Procedure: LEFT HEART CATH AND CORONARY ANGIOGRAPHY;  Surgeon: Corey Skains, MD;  Location: [place] [facility];  Service: Cardiovascular;  Laterality: N/A;   LEFT HEART CATH AND CORONARY ANGIOGRAPHY Left 06/19/2010   Procedure: LEFT HEART CATH AND CORONARY ANGIOGRAPHY; Location: [place]; Surgeon: Lowanda Foster, MD   LEFT HEART CATH AND CORONARY ANGIOGRAPHY Left 07/08/2014   Procedure: LEFT HEART CATH AND CORONARY ANGIOGRAPHY; Location: [place]; Surgeon: Isaias Cowman, MD   LOWER EXTREMITY ANGIOGRAM Left 04/11/2022   Procedure: LOWER EXTREMITY ANGIOGRAM;  Surgeon: Algernon Huxley, MD;  Location: [facility];  Service: Vascular;  Laterality: Left;   LOWER EXTREMITY ANGIOGRAPHY Right 09/25/2018   Procedure: LOWER EXTREMITY ANGIOGRAPHY;  Surgeon: Algernon Huxley, MD;  Location: [place] [facility];  Service: Cardiovascular;  Laterality: Right;   LOWER EXTREMITY ANGIOGRAPHY Left 03/17/2020   Procedure: LOWER EXTREMITY ANGIOGRAPHY;  Surgeon: Algernon Huxley, MD;  Location: [place] [facility];  Service: Cardiovascular;  Laterality: Left;   LOWER EXTREMITY ANGIOGRAPHY Right 02/22/2022   Procedure: Lower Extremity Angiography;  Surgeon: Algernon Huxley, MD;  Location: [place] [facility];  Service: Cardiovascular;  Laterality: Right;   POLYPECTOMY N/A 01/18/2020   Procedure: POLYPECTOMY;  Surgeon: [name], MD;  Location: [place];  Service: Endoscopy;  Laterality: N/A;     Social History   Tobacco Use   Smoking status: Every Day    Packs/day: 0.50    Years: 46.00    Total pack years: 23.00    Types: Cigarettes   Smokeless tobacco: Never   Tobacco comments:    Pt down to 1/2 PPD 05/24/21  Vaping Use   Vaping Use: Never used  Substance Use Topics   Alcohol use: Yes    Comment: quit drinking around 04/2019(May have drink 1x/mo)   Drug use: No     Medication list has been reviewed and updated.  Current Meds  Medication Sig   albuterol (VENTOLIN HFA) 108 (90 Base) MCG/ACT inhaler Inhale 2 puffs into the lungs every 6 (six) hours as needed for wheezing or shortness of breath.   aspirin 325 MG tablet Take 325 mg by mouth daily.   betamethasone valerate (VALISONE) 0.1 % cream Apply topically 2 (two) times daily. To rash on buttocks   cilostazol (PLETAL) 50 MG tablet Take by mouth 2 (two) times daily.    clopidogrel (PLAVIX) 75 MG tablet Take 1 tablet by mouth daily.   indomethacin (INDOCIN) 25 MG  capsule Take 1 capsule (25 mg total) by mouth 2 (two) times daily as needed.   isosorbide mononitrate (IMDUR) 120 MG 24 hr tablet Take 1 tablet (120 mg total) by mouth daily.   lisinopril (PRINIVIL,ZESTRIL) 10 MG tablet Take 1 tablet by mouth daily.   metoprolol tartrate (LOPRESSOR) 50 MG tablet Take 50-75 mg by mouth 2 (two) times a day. Take '50MG'$  by mouth every morning and '75MG'$  every evening   nitroGLYCERIN (NITROSTAT) 0.4 MG SL tablet Place 1 tablet under the tongue as needed.   psyllium (METAMUCIL) 58.6 % packet Take 1 packet by mouth daily.   simvastatin (ZOCOR) 40 MG tablet Take 1 tablet (40 mg total) by mouth at bedtime.       08/01/2022    2:06 PM 06/28/2022   10:52 AM 05/25/2022    1:44 PM 04/06/2022   10:03 AM  GAD 7 : Generalized Anxiety Score  Nervous, Anxious, on Edge 0 0 0 0  Control/stop worrying 0 0 0 0  Worry too much - different things 0 0 0 0  Trouble relaxing 0 0 0 0  Restless 0 0 0 0  Easily annoyed or irritable 0 0 0 0   Afraid - awful might happen 0 0 0 0  Total GAD 7 Score 0 0 0 0  Anxiety Difficulty Not difficult at all Not difficult at all Not difficult at all Not difficult at all       08/01/2022    2:06 PM 06/28/2022   10:52 AM 05/30/2022    1:21 PM  Depression screen PHQ 2/9  Decreased Interest 0 0 0  Down, Depressed, Hopeless 0 0 0  PHQ - 2 Score 0 0 0  Altered sleeping 0 0 0  Tired, decreased energy 0 0 0  Change in appetite 0 0 0  Feeling bad or failure about yourself  0 0 0  Trouble concentrating 0 0 0  Moving slowly or fidgety/restless 0 0 0  Suicidal thoughts 0 0 0  PHQ-9 Score 0 0 0  Difficult doing work/chores  Not difficult at all Not difficult at all    BP Readings from Last 3 Encounters:  08/01/22 118/60  06/28/22 126/68  06/05/22 131/63    Physical Exam Vitals and nursing note reviewed.  Constitutional:      General: He is not in acute distress.    Appearance: He is well-developed.  HENT:     Head: Normocephalic and atraumatic.  Pulmonary:     Effort: Pulmonary effort is normal. No respiratory distress.  Musculoskeletal:     Right hand: Deformity present.     Left hand: Deformity present. Decreased range of motion.     Comments: Moderately severe contractures of both 5th fingers, with mild contractures of both 4 th fingers. Tenderness of left palmar fascia.  Skin:    General: Skin is warm and dry.     Findings: No rash.  Neurological:     Mental Status: He is alert and oriented to person, place, and time.  Psychiatric:        Mood and Affect: Mood normal.        Behavior: Behavior normal.     Wt Readings from Last 3 Encounters:  08/01/22 192 lb 6.4 oz (87.3 kg)  06/28/22 193 lb 3.2 oz (87.6 kg)  06/05/22 199 lb (90.3 kg)    BP 118/60   Pulse 74   Ht '6\' 1"'$  (1.854 m)   Wt 192 lb 6.4 oz (87.3 kg)  SpO2 97%   BMI 25.38 kg/m   Assessment and Plan: Problem List Items Addressed This Visit       Musculoskeletal and Integument   Palmar fascial  fibromatosis (dupuytren) - Primary    long standing problem recently worsened by misuse recommend Ortho evaluation for surgical release       Relevant Orders   Ambulatory referral to Orthopedic Surgery     Partially dictated using Dragon software. Any errors are unintentional.  Halina Maidens, MD [facility]  08/01/2022

## 2022-08-01 NOTE — Assessment & Plan Note (Signed)
long standing problem recently worsened by misuse recommend Ortho evaluation for surgical release

## 2022-08-03 DIAGNOSIS — R6889 Other general symptoms and signs: Secondary | ICD-10-CM | POA: Diagnosis not present

## 2022-08-03 DIAGNOSIS — Z0100 Encounter for examination of eyes and vision without abnormal findings: Secondary | ICD-10-CM | POA: Diagnosis not present

## 2022-08-03 DIAGNOSIS — H40003 Preglaucoma, unspecified, bilateral: Secondary | ICD-10-CM | POA: Diagnosis not present

## 2022-08-06 ENCOUNTER — Ambulatory Visit (INDEPENDENT_AMBULATORY_CARE_PROVIDER_SITE_OTHER): Payer: Medicare HMO

## 2022-08-06 ENCOUNTER — Ambulatory Visit (INDEPENDENT_AMBULATORY_CARE_PROVIDER_SITE_OTHER): Payer: Medicare HMO | Admitting: Nurse Practitioner

## 2022-08-06 ENCOUNTER — Encounter (INDEPENDENT_AMBULATORY_CARE_PROVIDER_SITE_OTHER): Payer: Self-pay | Admitting: Nurse Practitioner

## 2022-08-06 VITALS — BP 115/66 | HR 59 | Ht 73.0 in | Wt 192.0 lb

## 2022-08-06 DIAGNOSIS — E782 Mixed hyperlipidemia: Secondary | ICD-10-CM

## 2022-08-06 DIAGNOSIS — F17200 Nicotine dependence, unspecified, uncomplicated: Secondary | ICD-10-CM | POA: Diagnosis not present

## 2022-08-06 DIAGNOSIS — I10 Essential (primary) hypertension: Secondary | ICD-10-CM

## 2022-08-06 DIAGNOSIS — I739 Peripheral vascular disease, unspecified: Secondary | ICD-10-CM

## 2022-08-06 DIAGNOSIS — Z9889 Other specified postprocedural states: Secondary | ICD-10-CM

## 2022-08-06 DIAGNOSIS — R6889 Other general symptoms and signs: Secondary | ICD-10-CM | POA: Diagnosis not present

## 2022-08-06 LAB — VAS US ABI WITH/WO TBI
Left ABI: 1.08
Right ABI: 0.99

## 2022-08-06 NOTE — Progress Notes (Signed)
Subjective:    Patient ID: Aaron [name], male    DOB: 1956-03-06, 67 y.o.   MRN: [id number] Chief Complaint  Patient presents with   Follow-up     Aaron [name] is a 67 year old male who presented to [facility] on 04/11/2022 for a left femoral endarterectomy with SFA stent placement.    The patient has been doing well over the last several months.  He continues to walk extensively and denies any worsening or new claudication-like symptoms.  There are no open wounds or ulcerations.  He still has some numbness and tenderness alongside his inner thighs but overall is much improved.  He denies any rest pain.  There are no open wounds or ulcerations.  Today noninvasive studies show an ABI of 0.99 on the right and 1.08 on the left.  He has biphasic waveforms throughout the bilateral lower extremities as noted by lower extremity arterial duplex.    Review of Systems  Skin:  Positive for wound.  All other systems reviewed and are negative.      Objective:   Physical Exam Vitals reviewed.  HENT:     Head: Normocephalic.  Cardiovascular:     Rate and Rhythm: Normal rate.     Pulses:          Dorsalis pedis pulses are detected w/ Doppler on the right side and detected w/ Doppler on the left side.       Posterior tibial pulses are detected w/ Doppler on the right side and detected w/ Doppler on the left side.  Pulmonary:     Effort: Pulmonary effort is normal.  Musculoskeletal:        General: Tenderness present.  Skin:    General: Skin is warm and dry.  Neurological:     Mental Status: He is alert and oriented to person, place, and time.  Psychiatric:        Mood and Affect: Mood normal.        Behavior: Behavior normal.        Thought Content: Thought content normal.        Judgment: Judgment normal.     BP 115/66   Pulse (!) 59   Ht '6\' 1"'$  (1.854 m)   Wt 192 lb (87.1 kg)   BMI 25.33 kg/m   Past Medical History:  Diagnosis Date   Aortic  atherosclerosis (HCC)    COPD (chronic obstructive pulmonary disease) (HCC)    Coronary artery disease 06/19/2010   a.) LHC/PCI (unknown date) --> stent to pRCA and pLAD (unknown type); b.) LHC 06/19/2010: 30 mLM, 60 mLAD, 60 pLCx, 50 pRI, 40 ISR pRCA, 50 dRCA, 50 RPDA, 50 RPLS --> med mgmt; c.) LHC 07/08/2014: 10 pLM, 30 dLAD, 75 oLCx, 30 mRCA, 40 RPDA, patent RCA stent --> med mgmt; d.) LHC 12/28/2021: 50 mRCA, 65 RPDA, 45 m-dRCA, 60 o-pRCA, 95 o-pLCx, 60 RI, 30 pLAD, 30 mLM --> med mgmt.   Diastolic dysfunction 22/29/7989   a.) TTE 09/16/2017: ED 55-60%, triv AR/MR, G1DD   Fracture of left ankle, closed, initial encounter 10/23/2017   GERD (gastroesophageal reflux disease)    Glaucoma    Gout    Hepatic steatosis    Hypercholesteremia    Hypertension    Long term current use of antithrombotics/antiplatelets    a.) cilostazol + full dose ASA + clopidogrel   PAD (peripheral artery disease) ([place])    a.) s/p multiple PTA procedures to lower extremities   Pain in limb  07/13/2016   Syncope 09/16/2017   Unstable angina ([place]) 01/12/2017   Vertigo     Social History   Socioeconomic History   Marital status: Single    Spouse name: Not on file   Number of children: 0   Years of education: Not on file   Highest education level: 12th grade  Occupational History   Occupation: Retired/Disabled  Tobacco Use   Smoking status: Every Day    Packs/day: 0.50    Years: 46.00    Total pack years: 23.00    Types: Cigarettes   Smokeless tobacco: Never   Tobacco comments:    Pt down to 1/2 PPD 05/24/21  Vaping Use   Vaping Use: Never used  Substance and Sexual Activity   Alcohol use: Yes    Comment: quit drinking around 04/2019(May have drink 1x/mo)   Drug use: No   Sexual activity: Not Currently    Birth control/protection: None  Other Topics Concern   Not on file  Social History Narrative   Independent at baseline. Lives alone. Does not drive.    Social Determinants of Health    Financial Resource Strain: Low Risk  (03/21/2022)   Overall Financial Resource Strain (CARDIA)    Difficulty of Paying Living Expenses: Not hard at all  Food Insecurity: No Food Insecurity (04/11/2022)   Hunger Vital Sign    Worried About Running Out of Food in the Last Year: Never true    Ran Out of Food in the Last Year: Never true  Transportation Needs: No Transportation Needs (04/11/2022)   PRAPARE - Hydrologist (Medical): No    Lack of Transportation (Non-Medical): No  Physical Activity: Sufficiently Active (05/30/2022)   Exercise Vital Sign    Days of Exercise per Week: 7 days    Minutes of Exercise per Session: 60 min  Stress: No Stress Concern Present (05/30/2022)   Rich    Feeling of Stress : Not at all  Social Connections: Socially Isolated (05/30/2022)   Social Connection and Isolation Panel [NHANES]    Frequency of Communication with Friends and Family: More than three times a week    Frequency of Social Gatherings with Friends and Family: More than three times a week    Attends Religious Services: Never    Marine scientist or Organizations: No    Attends Archivist Meetings: Never    Marital Status: Never married  Intimate Partner Violence: Not At Risk (04/11/2022)   Humiliation, Afraid, Rape, and Kick questionnaire    Fear of Current or Ex-Partner: No    Emotionally Abused: No    Physically Abused: No    Sexually Abused: No    Past Surgical History:  Procedure Laterality Date   APPENDECTOMY     CATARACT EXTRACTION W/PHACO Right 10/26/2021   Procedure: CATARACT EXTRACTION PHACO AND INTRAOCULAR LENS PLACEMENT ([place]) RIGHT OMIDRIA  10.98 01:18.2;  Surgeon: [name], MD;  Location: [place];  Service: Ophthalmology;  Laterality: Right;   CATARACT EXTRACTION W/PHACO Left 11/16/2021   Procedure: CATARACT EXTRACTION PHACO AND  INTRAOCULAR LENS PLACEMENT ([place]) LEFT;  Surgeon: [name], MD;  Location: [place];  Service: Ophthalmology;  Laterality: Left;  6.91 0:46.0   COLONOSCOPY  03/30/2013   [place]   COLONOSCOPY WITH PROPOFOL N/A 12/10/2019   Procedure: COLONOSCOPY WITH PROPOFOL;  Surgeon: [name], MD;  Location: [place];  Service: Endoscopy;  Laterality: N/A;   COLONOSCOPY WITH PROPOFOL N/A 01/18/2020   Procedure: COLONOSCOPY WITH PROPOFOL;  Surgeon: [name], MD;  Location: [place];  Service: Endoscopy;  Laterality: N/A;  4   CORONARY ANGIOPLASTY WITH STENT PLACEMENT Left    Procedure: CORONARY ANGIOPLASTY WITH STENT PLACEMENT   ENDARTERECTOMY FEMORAL Left 04/11/2022   Procedure: ENDARTERECTOMY FEMORAL ( SFA & TIBIAL); insertion of SFA stent;  Surgeon: Algernon Huxley, MD;  Location: [facility];  Service: Vascular;  Laterality: Left;   LEFT HEART CATH AND CORONARY ANGIOGRAPHY N/A 12/29/2020   Procedure: LEFT HEART CATH AND CORONARY ANGIOGRAPHY;  Surgeon: Corey Skains, MD;  Location: [place] [facility];  Service: Cardiovascular;  Laterality: N/A;   LEFT HEART CATH AND CORONARY ANGIOGRAPHY Left 06/19/2010   Procedure: LEFT HEART CATH AND CORONARY ANGIOGRAPHY; Location: [place]; Surgeon: Lowanda Foster, MD   LEFT HEART CATH AND CORONARY ANGIOGRAPHY Left 07/08/2014   Procedure: LEFT HEART CATH AND CORONARY ANGIOGRAPHY; Location: [place]; Surgeon: Isaias Cowman, MD   LOWER EXTREMITY ANGIOGRAM Left 04/11/2022   Procedure: LOWER EXTREMITY ANGIOGRAM;  Surgeon: Algernon Huxley, MD;  Location: [facility];  Service: Vascular;  Laterality: Left;   LOWER EXTREMITY ANGIOGRAPHY Right 09/25/2018   Procedure: LOWER EXTREMITY ANGIOGRAPHY;  Surgeon: Algernon Huxley, MD;  Location: [place] [facility];  Service: Cardiovascular;  Laterality: Right;   LOWER EXTREMITY ANGIOGRAPHY Left 03/17/2020   Procedure: LOWER EXTREMITY ANGIOGRAPHY;  Surgeon: Algernon Huxley, MD;  Location: [place] [facility];  Service: Cardiovascular;  Laterality: Left;   LOWER EXTREMITY ANGIOGRAPHY Right 02/22/2022   Procedure: Lower Extremity Angiography;  Surgeon: Algernon Huxley, MD;  Location: [place] [facility];  Service: Cardiovascular;  Laterality: Right;   POLYPECTOMY N/A 01/18/2020   Procedure: POLYPECTOMY;  Surgeon: [name], MD;  Location: [place];  Service: Endoscopy;  Laterality: N/A;    Family History  Problem Relation Age of Onset   Hypertension Mother    Diabetes Mother    Heart failure Father        age 32   Cancer Father    Ovarian cancer Sister    Heart disease Brother     Allergies  Allergen Reactions   Contrast Media [Iodinated Contrast Media] Hives   Iodine Hives       Latest Ref Rng & Units 04/15/2022    5:09 PM 04/12/2022   12:27 AM 04/11/2022    7:26 AM  CBC  WBC 4.0 - 10.5 K/uL 7.2  9.8  7.6   Hemoglobin 13.0 - 17.0 g/dL 12.9  13.1  16.1   Hematocrit 39.0 - 52.0 % 39.3  39.1  48.6   Platelets 150 - 400 K/uL 222  204  244       CMP     Component Value Date/Time   NA 136 04/15/2022 1709   NA 137 04/06/2022 1049   NA 137 07/02/2014 0513   K 3.8 04/15/2022 1709   K 4.0 07/02/2014 0513   CL 99 04/15/2022 1709   CL 103 07/02/2014 0513   CO2 30 04/15/2022 1709   CO2 26 07/02/2014 0513   GLUCOSE 132 (H) 04/15/2022 1709   GLUCOSE 109 (H) 07/02/2014 0513   BUN 18 04/15/2022 1709   BUN 19 04/06/2022 1049   BUN 15 07/02/2014 0513   CREATININE 0.93 04/15/2022 1709   CREATININE 0.81 07/02/2014 0513   CALCIUM 8.8 (L) 04/15/2022 1709   CALCIUM 8.7 07/02/2014 0513   PROT 7.1 04/06/2022 1049  PROT 7.9 12/03/2011 2127   ALBUMIN 4.5 04/06/2022 1049   ALBUMIN 4.1 12/03/2011 2127   AST 22 04/06/2022 1049   AST 23 12/03/2011 2127   ALT 28 04/06/2022 1049   ALT 22 12/03/2011 2127   ALKPHOS 84 04/06/2022 1049   ALKPHOS 90 12/03/2011 2127   BILITOT 0.4 04/06/2022 1049   BILITOT 0.2 12/03/2011 2127   GFRNONAA >60 04/15/2022 1709    GFRNONAA >60 07/02/2014 0513   GFRNONAA >60 11/07/2012 0442   GFRAA >60 03/17/2020 0829   GFRAA >60 07/02/2014 0513   GFRAA >60 11/07/2012 0442     VAS [place] ABI WITH/WO TBI  Result Date: 05/13/2022  LOWER EXTREMITY DOPPLER STUDY Patient Name:  Aaron [name]  Date of Exam:   05/11/2022 Medical Rec #: [id number]         Accession #:    [id number] Date of Birth: Nov 21, 1955         Patient Gender: M Patient Age:   47 years Exam Location:  [facility] Procedure:      VAS [place] ABI WITH/WO TBI Referring Phys: [name] --------------------------------------------------------------------------------  Indications: Peripheral artery disease. High Risk Factors: Hypertension, hyperlipidemia, current smoker.  Vascular Interventions: 04/11/2022 Endarterectomy femoral and tibial , left SFA                         stent                         09/25/2018: Right CIA & SFA stents with EIA & ATA PTAs;                         Left CIA/EIA stent;                         03/17/2020: Left SFA/popliteal stent;                         02/22/2022 rt sfa pop PTA and stents. Performing Technologist: Delorise Shiner RVT  Examination Guidelines: A complete evaluation includes at minimum, Doppler waveform signals and systolic blood pressure reading at the level of bilateral brachial, anterior tibial, and posterior tibial arteries, when vessel segments are accessible. Bilateral testing is considered an integral part of a complete examination. Photoelectric Plethysmograph (PPG) waveforms and toe systolic pressure readings are included as required and additional duplex testing as needed. Limited examinations for reoccurring indications may be performed as noted.  ABI Findings: +---------+------------------+-----+---------+--------+ Right    Rt Pressure (mmHg)IndexWaveform Comment  +---------+------------------+-----+---------+--------+ Brachial 144                                       +---------+------------------+-----+---------+--------+ PTA      129               0.90 biphasic          +---------+------------------+-----+---------+--------+ DP       131               0.91 triphasic         +---------+------------------+-----+---------+--------+ Great Toe91                0.63                   +---------+------------------+-----+---------+--------+ +---------+------------------+-----+---------+-------+  Left     Lt Pressure (mmHg)IndexWaveform Comment +---------+------------------+-----+---------+-------+ Brachial 136                                     +---------+------------------+-----+---------+-------+ PTA      128               0.89 triphasic        +---------+------------------+-----+---------+-------+ DP       137               0.95 biphasic         +---------+------------------+-----+---------+-------+ Great Toe78                0.54                  +---------+------------------+-----+---------+-------+ +-------+-----------+-----------+------------+------------+ ABI/TBIToday's ABIToday's TBIPrevious ABIPrevious TBI +-------+-----------+-----------+------------+------------+ Right  0.91       0.63       0.96        0.78         +-------+-----------+-----------+------------+------------+ Left   0.95       0.54       0.82        0.60         +-------+-----------+-----------+------------+------------+ Right ABIs appear essentially unchanged compared to prior study on 03/20/2022. Left ABIs appear increased compared to prior study on 03/20/2022.  Summary: Right: Resting right ankle-brachial index indicates mild right lower extremity arterial disease. The right toe-brachial index is abnormal. Left: Resting left ankle-brachial index is within normal range. The left toe-brachial index is abnormal. *See table(s) above for measurements and observations.  Electronically signed by [name] MD on 05/13/2022 at 7:17:41 PM.    Final         Assessment & Plan:   1. Peripheral arterial disease with history of revascularization (HCC)  Recommend:  The patient has evidence of atherosclerosis of the lower extremities with minimal claudication.  The patient does not voice lifestyle limiting changes at this point in time.  Noninvasive studies do not suggest clinically significant change.  No invasive studies, angiography or surgery at this time The patient should continue walking and begin a more formal exercise program.  The patient should continue antiplatelet therapy and aggressive treatment of the lipid abnormalities  No changes in the patient's medications at this time  Continued surveillance is indicated as atherosclerosis is likely to progress with time.    The patient will continue follow up with noninvasive studies as ordered.   2. Tobacco use disorder Smoking cessation was discussed, 3-10 minutes spent on this topic specifically  3. Essential (primary) hypertension Continue antihypertensive medications as already ordered, these medications have been reviewed and there are no changes at this time.  4. Mixed hyperlipidemia Continue statin as ordered and reviewed, no changes at this time    Current Outpatient Medications on File Prior to Visit  Medication Sig Dispense Refill   albuterol (VENTOLIN HFA) 108 (90 Base) MCG/ACT inhaler Inhale 2 puffs into the lungs every 6 (six) hours as needed for wheezing or shortness of breath. 6.7 g 5   aspirin 325 MG tablet Take 325 mg by mouth daily.     betamethasone valerate (VALISONE) 0.1 % cream Apply topically 2 (two) times daily. To rash on buttocks 30 g 0   cilostazol (PLETAL) 50 MG tablet Take by mouth 2 (two) times daily.      clopidogrel (PLAVIX) 75 MG tablet  Take 1 tablet by mouth daily.     indomethacin (INDOCIN) 25 MG capsule Take 1 capsule (25 mg total) by mouth 2 (two) times daily as needed. 30 capsule 1   isosorbide mononitrate (IMDUR) 120 MG 24 hr tablet  Take 1 tablet (120 mg total) by mouth daily. 30 tablet 0   lisinopril (PRINIVIL,ZESTRIL) 10 MG tablet Take 1 tablet by mouth daily.     metoprolol tartrate (LOPRESSOR) 50 MG tablet Take 50-75 mg by mouth 2 (two) times a day. Take '50MG'$  by mouth every morning and '75MG'$  every evening     nitroGLYCERIN (NITROSTAT) 0.4 MG SL tablet Place 1 tablet under the tongue as needed.     psyllium (METAMUCIL) 58.6 % packet Take 1 packet by mouth daily.     simvastatin (ZOCOR) 40 MG tablet Take 1 tablet (40 mg total) by mouth at bedtime. 30 tablet 5   No current facility-administered medications on file prior to visit.    There are no Patient Instructions on file for this visit. No follow-ups on file.   Kris Hartmann, NP

## 2022-08-13 ENCOUNTER — Encounter: Payer: Self-pay | Admitting: Internal Medicine

## 2022-08-13 ENCOUNTER — Ambulatory Visit (INDEPENDENT_AMBULATORY_CARE_PROVIDER_SITE_OTHER): Payer: Medicare HMO | Admitting: Internal Medicine

## 2022-08-13 VITALS — BP 116/72 | HR 78 | Temp 98.2°F | Ht 73.0 in | Wt 191.0 lb

## 2022-08-13 DIAGNOSIS — J441 Chronic obstructive pulmonary disease with (acute) exacerbation: Secondary | ICD-10-CM | POA: Diagnosis not present

## 2022-08-13 MED ORDER — DOXYCYCLINE HYCLATE 100 MG PO TABS: 100.0000 mg | ORAL_TABLET | Freq: Two times a day (BID) | ORAL | 0 refills | Status: AC

## 2022-08-13 MED ORDER — BENZONATATE 100 MG PO CAPS: 100.0000 mg | ORAL_CAPSULE | Freq: Three times a day (TID) | ORAL | 0 refills | Status: AC

## 2022-08-13 MED ORDER — PREDNISONE 10 MG PO TABS: ORAL_TABLET | ORAL | 0 refills | Status: AC

## 2022-08-13 NOTE — Progress Notes (Signed)
Date:  08/13/2022   Name:  Aaron [name]   DOB:  [birth year]   MRN:  [id number]   Chief Complaint: Cough (Cough-dry. Taking robitussin and mucinex with no relief. )  Cough This is a new problem. Episode onset: 4 days ago. The problem occurs every few minutes. The cough is Non-productive. Associated symptoms include nasal congestion and shortness of breath. Pertinent negatives include no chest pain, chills, ear congestion, fever, headaches, postnasal drip, rash, sore throat or wheezing. The symptoms are aggravated by lying down, exercise and cold air. He has tried a beta-agonist inhaler for the symptoms. The treatment provided moderate relief. His past medical history is significant for COPD.    Lab Results  Component Value Date   NA 136 04/15/2022   K 3.8 04/15/2022   CO2 30 04/15/2022   GLUCOSE 132 (H) 04/15/2022   BUN 18 04/15/2022   CREATININE 0.93 04/15/2022   CALCIUM 8.8 (L) 04/15/2022   EGFR 91 04/06/2022   GFRNONAA >60 04/15/2022   Lab Results  Component Value Date   CHOL 144 04/06/2022   HDL 39 (L) 04/06/2022   LDLCALC 78 04/06/2022   TRIG 155 (H) 04/06/2022   CHOLHDL 3.7 04/06/2022   Lab Results  Component Value Date   TSH 3.214 02/06/2019   Lab Results  Component Value Date   HGBA1C 6.0 (H) 04/06/2022   Lab Results  Component Value Date   WBC 7.2 04/15/2022   HGB 12.9 (L) 04/15/2022   HCT 39.3 04/15/2022   MCV 97.0 04/15/2022   PLT 222 04/15/2022   Lab Results  Component Value Date   ALT 28 04/06/2022   AST 22 04/06/2022   ALKPHOS 84 04/06/2022   BILITOT 0.4 04/06/2022   No results found for: "25OHVITD2", "25OHVITD3", "VD25OH"   Review of Systems  Constitutional:  Negative for chills and fever.  HENT:  Negative for postnasal drip, sinus pressure, sore throat and trouble swallowing.   Respiratory:  Positive for cough and shortness of breath. Negative for wheezing.   Cardiovascular:  Negative for chest pain, palpitations and leg swelling.   Gastrointestinal:  Negative for nausea and vomiting.  Skin:  Negative for rash.  Neurological:  Negative for dizziness and headaches.  Psychiatric/Behavioral:  Negative for dysphoric mood and sleep disturbance. The patient is not nervous/anxious.     Patient Active Problem List   Diagnosis Date Noted   Atherosclerosis of artery of extremity with intermittent claudication ([place]) 04/11/2022   Hepatic steatosis 01/04/2022   Aortic atherosclerosis ([place]) 07/10/2020   Stage 2 moderate COPD by GOLD classification ([place]) 05/16/2020   Dyspepsia and disorder of function of stomach 05/16/2020   Cervical disc disorder of mid-cervical region 05/10/2020   Polyp of descending colon    Mixed hyperlipidemia 10/28/2018   Atherosclerosis of native arteries of extremity with intermittent claudication ([place]) 07/13/2016   Tobacco use disorder 07/13/2016   Prediabetes 07/27/2015   Controlled gout 12/22/2014   Coronary artery disease involving native coronary artery of native heart with angina pectoris ([place]) 10/09/2014   Essential (primary) hypertension 10/09/2014   Palmar fascial fibromatosis ([place]) 10/09/2014   Psoriasis 10/09/2014   H/O cardiac catheterization 01/27/2014   Peripheral vascular disease ([place]) 01/27/2014    Allergies  Allergen Reactions   Contrast Media [Iodinated Contrast Media] Hives   Iodine Hives    Past Surgical History:  Procedure Laterality Date   APPENDECTOMY     CATARACT EXTRACTION W/PHACO Right 10/26/2021   Procedure: CATARACT EXTRACTION PHACO AND INTRAOCULAR  LENS PLACEMENT ([place]) RIGHT OMIDRIA  10.98 01:18.2;  Surgeon: [name], MD;  Location: [place];  Service: Ophthalmology;  Laterality: Right;   CATARACT EXTRACTION W/PHACO Left 11/16/2021   Procedure: CATARACT EXTRACTION PHACO AND INTRAOCULAR LENS PLACEMENT ([place]) LEFT;  Surgeon: [name], MD;  Location: [place];  Service: Ophthalmology;  Laterality: Left;   6.91 0:46.0   COLONOSCOPY  03/30/2013   [place]   COLONOSCOPY WITH PROPOFOL N/A 12/10/2019   Procedure: COLONOSCOPY WITH PROPOFOL;  Surgeon: [name], MD;  Location: [place];  Service: Endoscopy;  Laterality: N/A;   COLONOSCOPY WITH PROPOFOL N/A 01/18/2020   Procedure: COLONOSCOPY WITH PROPOFOL;  Surgeon: [name], MD;  Location: [place];  Service: Endoscopy;  Laterality: N/A;  4   CORONARY ANGIOPLASTY WITH STENT PLACEMENT Left    Procedure: CORONARY ANGIOPLASTY WITH STENT PLACEMENT   ENDARTERECTOMY FEMORAL Left 04/11/2022   Procedure: ENDARTERECTOMY FEMORAL ( SFA & TIBIAL); insertion of SFA stent;  Surgeon: Algernon Huxley, MD;  Location: [facility];  Service: Vascular;  Laterality: Left;   LEFT HEART CATH AND CORONARY ANGIOGRAPHY N/A 12/29/2020   Procedure: LEFT HEART CATH AND CORONARY ANGIOGRAPHY;  Surgeon: Corey Skains, MD;  Location: [place] [facility];  Service: Cardiovascular;  Laterality: N/A;   LEFT HEART CATH AND CORONARY ANGIOGRAPHY Left 06/19/2010   Procedure: LEFT HEART CATH AND CORONARY ANGIOGRAPHY; Location: [place]; Surgeon: Lowanda Foster, MD   LEFT HEART CATH AND CORONARY ANGIOGRAPHY Left 07/08/2014   Procedure: LEFT HEART CATH AND CORONARY ANGIOGRAPHY; Location: [place]; Surgeon: Isaias Cowman, MD   LOWER EXTREMITY ANGIOGRAM Left 04/11/2022   Procedure: LOWER EXTREMITY ANGIOGRAM;  Surgeon: Algernon Huxley, MD;  Location: [facility];  Service: Vascular;  Laterality: Left;   LOWER EXTREMITY ANGIOGRAPHY Right 09/25/2018   Procedure: LOWER EXTREMITY ANGIOGRAPHY;  Surgeon: Algernon Huxley, MD;  Location: [place] [facility];  Service: Cardiovascular;  Laterality: Right;   LOWER EXTREMITY ANGIOGRAPHY Left 03/17/2020   Procedure: LOWER EXTREMITY ANGIOGRAPHY;  Surgeon: Algernon Huxley, MD;  Location: [place] [facility];  Service: Cardiovascular;  Laterality: Left;   LOWER EXTREMITY ANGIOGRAPHY Right 02/22/2022   Procedure: Lower Extremity Angiography;   Surgeon: Algernon Huxley, MD;  Location: [place] [facility];  Service: Cardiovascular;  Laterality: Right;   POLYPECTOMY N/A 01/18/2020   Procedure: POLYPECTOMY;  Surgeon: [name], MD;  Location: [place];  Service: Endoscopy;  Laterality: N/A;    Social History   Tobacco Use   Smoking status: Every Day    Packs/day: 0.50    Years: 46.00    Total pack years: 23.00    Types: Cigarettes   Smokeless tobacco: Never   Tobacco comments:    Pt down to 1/2 PPD 05/24/21  Vaping Use   Vaping Use: Never used  Substance Use Topics   Alcohol use: Yes    Comment: quit drinking around 04/2019(May have drink 1x/mo)   Drug use: No     Medication list has been reviewed and updated.  Current Meds  Medication Sig   albuterol (VENTOLIN HFA) 108 (90 Base) MCG/ACT inhaler Inhale 2 puffs into the lungs every 6 (six) hours as needed for wheezing or shortness of breath.   aspirin 325 MG tablet Take 325 mg by mouth daily.   benzonatate (TESSALON) 100 MG capsule Take 1 capsule (100 mg total) by mouth 3 (three) times daily for 10 days.   betamethasone valerate (VALISONE) 0.1 % cream Apply topically 2 (two)  times daily. To rash on buttocks   cilostazol (PLETAL) 50 MG tablet Take by mouth 2 (two) times daily.    clopidogrel (PLAVIX) 75 MG tablet Take 1 tablet by mouth daily.   doxycycline (VIBRA-TABS) 100 MG tablet Take 1 tablet (100 mg total) by mouth 2 (two) times daily for 10 days.   indomethacin (INDOCIN) 25 MG capsule Take 1 capsule (25 mg total) by mouth 2 (two) times daily as needed.   isosorbide mononitrate (IMDUR) 120 MG 24 hr tablet Take 1 tablet (120 mg total) by mouth daily.   lisinopril (PRINIVIL,ZESTRIL) 10 MG tablet Take 1 tablet by mouth daily.   metoprolol tartrate (LOPRESSOR) 50 MG tablet Take 50-75 mg by mouth 2 (two) times a day. Take '50MG'$  by mouth every morning and '75MG'$  every evening   nitroGLYCERIN (NITROSTAT) 0.4 MG SL tablet Place 1 tablet under the tongue as needed.    predniSONE (DELTASONE) 10 MG tablet Take 6 tablets (60 mg total) by mouth daily with breakfast for 1 day, THEN 5 tablets (50 mg total) daily with breakfast for 1 day, THEN 4 tablets (40 mg total) daily with breakfast for 1 day, THEN 3 tablets (30 mg total) daily with breakfast for 1 day, THEN 2 tablets (20 mg total) daily with breakfast for 1 day, THEN 1 tablet (10 mg total) daily with breakfast for 1 day.   psyllium (METAMUCIL) 58.6 % packet Take 1 packet by mouth daily.   simvastatin (ZOCOR) 40 MG tablet Take 1 tablet (40 mg total) by mouth at bedtime.       08/13/2022    2:47 PM 08/01/2022    2:06 PM 06/28/2022   10:52 AM 05/25/2022    1:44 PM  GAD 7 : Generalized Anxiety Score  Nervous, Anxious, on Edge 0 0 0 0  Control/stop worrying 0 0 0 0  Worry too much - different things 0 0 0 0  Trouble relaxing 0 0 0 0  Restless 0 0 0 0  Easily annoyed or irritable 0 0 0 0  Afraid - awful might happen 0 0 0 0  Total GAD 7 Score 0 0 0 0  Anxiety Difficulty Not difficult at all Not difficult at all Not difficult at all Not difficult at all       08/13/2022    2:47 PM 08/01/2022    2:06 PM 06/28/2022   10:52 AM  Depression screen PHQ 2/9  Decreased Interest 0 0 0  Down, Depressed, Hopeless 0 0 0  PHQ - 2 Score 0 0 0  Altered sleeping 0 0 0  Tired, decreased energy 0 0 0  Change in appetite 0 0 0  Feeling bad or failure about yourself  0 0 0  Trouble concentrating 0 0 0  Moving slowly or fidgety/restless 0 0 0  Suicidal thoughts 0 0 0  PHQ-9 Score 0 0 0  Difficult doing work/chores Not difficult at all  Not difficult at all    BP Readings from Last 3 Encounters:  08/13/22 116/72  08/06/22 115/66  08/01/22 118/60    Physical Exam Vitals and nursing note reviewed.  Constitutional:      General: He is not in acute distress.    Appearance: Normal appearance. He is well-developed.  HENT:     Head: Normocephalic and atraumatic.  Neck:     Vascular: No carotid bruit.   Cardiovascular:     Rate and Rhythm: Normal rate and regular rhythm.     Pulses: Normal pulses.  Heart sounds: No murmur heard. Pulmonary:     Effort: Pulmonary effort is normal. No accessory muscle usage or respiratory distress.     Breath sounds: Examination of the right-upper field reveals decreased breath sounds. Examination of the left-upper field reveals decreased breath sounds. Examination of the right-middle field reveals rhonchi. Examination of the left-middle field reveals rhonchi. Examination of the right-lower field reveals rhonchi. Examination of the left-lower field reveals rhonchi. Decreased breath sounds and rhonchi present.  Musculoskeletal:     Cervical back: Normal range of motion.  Lymphadenopathy:     Cervical: No cervical adenopathy.  Skin:    General: Skin is warm and dry.     Findings: No rash.  Neurological:     Mental Status: He is alert and oriented to person, place, and time.  Psychiatric:        Mood and Affect: Mood normal.        Behavior: Behavior normal.     Wt Readings from Last 3 Encounters:  08/13/22 191 lb (86.6 kg)  08/06/22 192 lb (87.1 kg)  08/01/22 192 lb 6.4 oz (87.3 kg)    BP 116/72   Pulse 78   Temp 98.2 F (36.8 C) (Oral)   Ht '6\' 1"'$  (1.854 m)   Wt 191 lb (86.6 kg)   SpO2 99%   BMI 25.20 kg/m   Assessment and Plan: Problem List Items Addressed This Visit   None Visit Diagnoses     Acute exacerbation of chronic obstructive pulmonary disease (COPD) ([place])    -  Primary   continue Albuterol and Mucinex pust fluids seeing Pulmonary tomorrow   Relevant Medications   doxycycline (VIBRA-TABS) 100 MG tablet   predniSONE (DELTASONE) 10 MG tablet   benzonatate (TESSALON) 100 MG capsule        Partially dictated using Editor, commissioning. Any errors are unintentional.  Halina Maidens, MD [facility]  08/13/2022

## 2022-08-22 ENCOUNTER — Ambulatory Visit: Payer: PPO | Admitting: Internal Medicine

## 2022-08-23 ENCOUNTER — Ambulatory Visit: Payer: PPO | Admitting: Internal Medicine

## 2022-08-27 DIAGNOSIS — R6889 Other general symptoms and signs: Secondary | ICD-10-CM | POA: Diagnosis not present

## 2022-08-27 DIAGNOSIS — J441 Chronic obstructive pulmonary disease with (acute) exacerbation: Secondary | ICD-10-CM | POA: Diagnosis not present

## 2022-08-27 DIAGNOSIS — F17211 Nicotine dependence, cigarettes, in remission: Secondary | ICD-10-CM | POA: Diagnosis not present

## 2022-08-29 ENCOUNTER — Encounter: Payer: Self-pay | Admitting: Vascular Surgery

## 2022-09-17 ENCOUNTER — Emergency Department: Payer: Medicare HMO

## 2022-09-17 ENCOUNTER — Other Ambulatory Visit: Payer: Self-pay

## 2022-09-17 ENCOUNTER — Emergency Department
Admission: EM | Admit: 2022-09-17 | Discharge: 2022-09-17 | Disposition: A | Discharge status: Home / Self Care | Payer: Medicare HMO | Attending: Emergency Medicine | Admitting: Emergency Medicine

## 2022-09-17 DIAGNOSIS — R202 Paresthesia of skin: Secondary | ICD-10-CM | POA: Diagnosis not present

## 2022-09-17 DIAGNOSIS — R079 Chest pain, unspecified: Secondary | ICD-10-CM | POA: Diagnosis not present

## 2022-09-17 DIAGNOSIS — M4802 Spinal stenosis, cervical region: Secondary | ICD-10-CM

## 2022-09-17 DIAGNOSIS — M5000 Cervical disc disorder with myelopathy, unspecified cervical region: Secondary | ICD-10-CM | POA: Diagnosis not present

## 2022-09-17 DIAGNOSIS — R059 Cough, unspecified: Secondary | ICD-10-CM | POA: Diagnosis not present

## 2022-09-17 DIAGNOSIS — I10 Essential (primary) hypertension: Secondary | ICD-10-CM | POA: Diagnosis not present

## 2022-09-17 DIAGNOSIS — M542 Cervicalgia: Secondary | ICD-10-CM | POA: Diagnosis not present

## 2022-09-17 DIAGNOSIS — R519 Headache, unspecified: Secondary | ICD-10-CM | POA: Diagnosis not present

## 2022-09-17 DIAGNOSIS — J449 Chronic obstructive pulmonary disease, unspecified: Secondary | ICD-10-CM | POA: Diagnosis not present

## 2022-09-17 DIAGNOSIS — H538 Other visual disturbances: Secondary | ICD-10-CM | POA: Diagnosis not present

## 2022-09-17 LAB — BASIC METABOLIC PANEL
Anion gap: 8 (ref 5–15)
BUN: 19 mg/dL (ref 8–23)
CO2: 23 mmol/L (ref 22–32)
Calcium: 9.2 mg/dL (ref 8.9–10.3)
Chloride: 99 mmol/L (ref 98–111)
Creatinine, Ser: 0.87 mg/dL (ref 0.61–1.24)
GFR, Estimated: >60 mL/min (ref >60)
Glucose, Bld: 142 mg/dL — ABNORMAL HIGH (ref 70–99)
Potassium: 4 mmol/L (ref 3.5–5.1)
Sodium: 130 mmol/L — ABNORMAL LOW (ref 135–145)

## 2022-09-17 LAB — CBG MONITORING, ED: Glucose-Capillary: 120 mg/dL — ABNORMAL HIGH (ref 70–99)

## 2022-09-17 LAB — URINALYSIS, ROUTINE W REFLEX MICROSCOPIC
Bacteria, UA: NONE SEEN
Bilirubin Urine: NEGATIVE
Glucose, UA: NEGATIVE mg/dL
Ketones, ur: NEGATIVE mg/dL
Leukocytes,Ua: NEGATIVE
Nitrite: NEGATIVE
Protein, ur: NEGATIVE mg/dL
Specific Gravity, Urine: 1.006 (ref 1.005–1.030)
Squamous Epithelial / HPF: NONE SEEN /HPF (ref 0–5)
pH: 5 (ref 5.0–8.0)

## 2022-09-17 LAB — CBC
HCT: 46.9 % (ref 39.0–52.0)
Hemoglobin: 15.7 g/dL (ref 13.0–17.0)
MCH: 31 pg (ref 26.0–34.0)
MCHC: 33.5 g/dL (ref 30.0–36.0)
MCV: 92.7 fL (ref 80.0–100.0)
Platelets: 225 10*3/uL (ref 150–400)
RBC: 5.06 MIL/uL (ref 4.22–5.81)
RDW: 14.7 % (ref 11.5–15.5)
WBC: 7.4 10*3/uL (ref 4.0–10.5)
nRBC: 0 % (ref 0.0–0.2)

## 2022-09-17 NOTE — ED Provider Notes (Signed)
[facility] Provider Note    Event Date/Time   First MD Initiated Contact with Patient 09/17/22 1101     (approximate)   History   Chief Complaint: Weakness   HPI  BROEDY MOGG is a 67 y.o. male with a history of hypertension gout GERD PAD COPD who comes ED complaining of numbness and tingling in both arms for the past week.  Gradual onset, no aggravating or alleviating factors, constant.  No recent falls or trauma, no head injuries.  He does report having some intermittent pain in the back of the neck which radiates up to the occipital scalp starting yesterday, lasting a few seconds at a time, occurring 4 times total over the last 24 hours.  No fever or chills.  No vision changes or motor weakness or changes in balance or coordination.  Never had symptoms like this before     Physical Exam   Triage Vital Signs: ED Triage Vitals  Enc Vitals Group     BP 09/17/22 1117 130/72     Pulse Rate 09/17/22 1117 63     Resp 09/17/22 1117 15     Temp 09/17/22 1117 (!) 97.5 F (36.4 C)     Temp src --      SpO2 09/17/22 1053 97 %     Weight 09/17/22 1058 187 lb (84.8 kg)     Height 09/17/22 1058 '6\' 1"'$  (1.854 m)     Head Circumference --      Peak Flow --      Pain Score 09/17/22 1057 5     Pain Loc --      Pain Edu? --      Excl. in [place]? --     Most recent vital signs: Vitals:   09/17/22 1230 09/17/22 1300  BP: 134/67 122/72  Pulse: 66 65  Resp: (!) 24 19  Temp:    SpO2: 94% 94%    General: Awake, no distress.  CV:  Good peripheral perfusion.  Regular rate and rhythm Resp:  Normal effort.  Clear to auscultation bilaterally Abd:  No distention.  Other:  Neuro intact.  No motor weakness.  Intact sensation bilaterally   ED Results / Procedures / Treatments   Labs (all labs ordered are listed, but only abnormal results are displayed) Labs Reviewed  BASIC METABOLIC PANEL - Abnormal; Notable for the following components:      Result Value    Sodium 130 (*)    Glucose, Bld 142 (*)    All other components within normal limits  URINALYSIS, ROUTINE W REFLEX MICROSCOPIC - Abnormal; Notable for the following components:   Color, Urine STRAW (*)    APPearance CLEAR (*)    Hgb urine dipstick SMALL (*)    All other components within normal limits  CBG MONITORING, ED - Abnormal; Notable for the following components:   Glucose-Capillary 120 (*)    All other components within normal limits  CBC     EKG Interpreted by me Sinus rhythm rate of 63.  Normal axis, intervals, QRS.  J-point elevation in the inferior leads without frank ST elevation.  Normal T waves.  No ischemic changes.  Compared to previous EKG on April 11, 2022, no significant change.  J-point elevation was present at that time.   RADIOLOGY Chest x-ray interpreted by me, appears normal.  Radiology report reviewed.  MRI brain and cervical spine shows moderate spinal stenosis and some left cervical foraminal stenosis   PROCEDURES:  Procedures   MEDICATIONS ORDERED IN ED: Medications - No data to display   IMPRESSION / MDM / [place] / ED COURSE  I reviewed the triage vital signs and the nursing notes.  DDx: Spinal stenosis, foraminal stenosis, peripheral neuropathy, syringomyelia, myelitis, thalamic stroke, anemia, electrolyte abnormality, dehydration  Patient's presentation is most consistent with acute presentation with potential threat to life or bodily function.  Patient presents with paresthesias in both arms for the past week.  Overall reassuring exam, but with new onset of the symptoms that are persistent, MRI obtained which reveals cervical spinal stenosis is the likely cause of his symptoms.  No motor weakness or evidence of critical spinal injury, stable for discharge and outpatient follow-up with neurosurgery.  Chest x-ray obtained due to persistent left chest discomfort with breathing and nonproductive cough.  No evidence of pneumonia or  pneumothorax.  I doubt PE or ACS.       FINAL CLINICAL IMPRESSION(S) / ED DIAGNOSES   Final diagnoses:  Cervical spinal stenosis     Rx / DC Orders   ED Discharge Orders     None        Note:  This document was prepared using Dragon voice recognition software and may include unintentional dictation errors.   Carrie Mew, MD 09/17/22 1452

## 2022-09-17 NOTE — ED Triage Notes (Signed)
Pt to ED via [facility] from home. C/O numbness in arms and tingling in hands x1 week. Intermittent pain in neck described as sharp and stabbing that radiates up the back of the head began yesterday. Hx HTN

## 2022-09-17 NOTE — Discharge Instructions (Signed)
Your MRI shows narrowing of parts of the spine in your neck which is pressing on nerves and causing your symptoms.  Please follow up with Neurosurgery for further evaluation of these findings.  MRI summary: IMPRESSION:  1. No evidence of acute intracranial abnormality.  2. Moderate foraminal stenosis on the left at C4-C5 and the right at  C6-C7. Mild canal stenosis at C4-C5 with disc contacting and  flattening the cord.  3. Milder multilevel degenerative changes detailed above.

## 2022-09-26 DIAGNOSIS — J449 Chronic obstructive pulmonary disease, unspecified: Secondary | ICD-10-CM | POA: Diagnosis not present

## 2022-09-26 DIAGNOSIS — R6889 Other general symptoms and signs: Secondary | ICD-10-CM | POA: Diagnosis not present

## 2022-09-26 DIAGNOSIS — F17218 Nicotine dependence, cigarettes, with other nicotine-induced disorders: Secondary | ICD-10-CM | POA: Diagnosis not present

## 2022-10-01 NOTE — Progress Notes (Deleted)
Referring Physician:  No referring provider defined for this encounter.  Primary Physician:  Glean Hess, MD  History of Present Illness: 10/01/2022 [name] is here today with a chief complaint of ***   Any neck pain? Numbness and tingling in the bilateral arms? Dropping things or balance issues?  Duration: ***2 weeks? Location: *** Quality: ***numbness, tingling Severity: ***10/10?  Precipitating: aggravated by *** Modifying factors: made better by ***nothing? Weakness: none Timing: ***intermittent? Bowel/Bladder Dysfunction: none  Conservative measures:  Physical therapy: *** has not participated in? Multimodal medical therapy including regular antiinflammatories: *** none? Injections: *** has not received any epidural steroid injections  Past Surgery: ***denies  Aaron [name] has ***no symptoms of cervical myelopathy.  The symptoms are causing a significant impact on the patient's life.   I have utilized the care everywhere function in epic to review the outside records available from external health systems.  Review of Systems:  A 10 point review of systems is negative, except for the pertinent positives and negatives detailed in the HPI.  Past Medical History: Past Medical History:  Diagnosis Date   Aortic atherosclerosis ([place])    COPD (chronic obstructive pulmonary disease) ([place])    Coronary artery disease 06/19/2010   a.) LHC/PCI (unknown date) --> stent to pRCA and pLAD (unknown type); b.) LHC 06/19/2010: 30 mLM, 60 mLAD, 60 pLCx, 50 pRI, 40 ISR pRCA, 50 dRCA, 50 RPDA, 50 RPLS --> med mgmt; c.) LHC 07/08/2014: 10 pLM, 30 dLAD, 75 oLCx, 30 mRCA, 40 RPDA, patent RCA stent --> med mgmt; d.) LHC 12/28/2021: 50 mRCA, 65 RPDA, 45 m-dRCA, 60 o-pRCA, 95 o-pLCx, 60 RI, 30 pLAD, 30 mLM --> med mgmt.   Diastolic dysfunction [id number]   a.) TTE 09/16/2017: ED 55-60%, triv AR/MR, G1DD   Fracture of left ankle, closed, initial encounter 10/23/2017    GERD (gastroesophageal reflux disease)    Glaucoma    Gout    Hepatic steatosis    Hypercholesteremia    Hypertension    Long term current use of antithrombotics/antiplatelets    a.) cilostazol + full dose ASA + clopidogrel   PAD (peripheral artery disease) ([place])    a.) s/p multiple PTA procedures to lower extremities   Pain in limb 07/13/2016   Syncope 09/16/2017   Unstable angina ([place]) 01/12/2017   Vertigo     Past Surgical History: Past Surgical History:  Procedure Laterality Date   APPENDECTOMY     CATARACT EXTRACTION W/PHACO Right 10/26/2021   Procedure: CATARACT EXTRACTION PHACO AND INTRAOCULAR LENS PLACEMENT ([place]) RIGHT OMIDRIA  10.98 01:18.2;  Surgeon: [name], MD;  Location: [place];  Service: Ophthalmology;  Laterality: Right;   CATARACT EXTRACTION W/PHACO Left 11/16/2021   Procedure: CATARACT EXTRACTION PHACO AND INTRAOCULAR LENS PLACEMENT ([place]) LEFT;  Surgeon: [name], MD;  Location: [place];  Service: Ophthalmology;  Laterality: Left;  6.91 0:46.0   COLONOSCOPY  03/30/2013   [place]   COLONOSCOPY WITH PROPOFOL N/A 12/10/2019   Procedure: COLONOSCOPY WITH PROPOFOL;  Surgeon: [name], MD;  Location: [place];  Service: Endoscopy;  Laterality: N/A;   COLONOSCOPY WITH PROPOFOL N/A 01/18/2020   Procedure: COLONOSCOPY WITH PROPOFOL;  Surgeon: [name], MD;  Location: [place];  Service: Endoscopy;  Laterality: N/A;  4   CORONARY ANGIOPLASTY WITH STENT PLACEMENT Left    Procedure: CORONARY ANGIOPLASTY WITH STENT PLACEMENT   ENDARTERECTOMY FEMORAL Left 04/11/2022   Procedure: ENDARTERECTOMY FEMORAL ( SFA & TIBIAL);  insertion of SFA stent;  Surgeon: Algernon Huxley, MD;  Location: [facility];  Service: Vascular;  Laterality: Left;   LEFT HEART CATH AND CORONARY ANGIOGRAPHY N/A 12/29/2020   Procedure: LEFT HEART CATH AND CORONARY ANGIOGRAPHY;  Surgeon: Corey Skains, MD;  Location: [place] [facility];  Service: Cardiovascular;  Laterality: N/A;   LEFT HEART CATH AND CORONARY ANGIOGRAPHY Left 06/19/2010   Procedure: LEFT HEART CATH AND CORONARY ANGIOGRAPHY; Location: [place]; Surgeon: Lowanda Foster, MD   LEFT HEART CATH AND CORONARY ANGIOGRAPHY Left 07/08/2014   Procedure: LEFT HEART CATH AND CORONARY ANGIOGRAPHY; Location: [place]; Surgeon: Isaias Cowman, MD   LOWER EXTREMITY ANGIOGRAM Left 04/11/2022   Procedure: LOWER EXTREMITY ANGIOGRAM;  Surgeon: Algernon Huxley, MD;  Location: [facility];  Service: Vascular;  Laterality: Left;   LOWER EXTREMITY ANGIOGRAPHY Right 09/25/2018   Procedure: LOWER EXTREMITY ANGIOGRAPHY;  Surgeon: Algernon Huxley, MD;  Location: [place] [facility];  Service: Cardiovascular;  Laterality: Right;   LOWER EXTREMITY ANGIOGRAPHY Left 03/17/2020   Procedure: LOWER EXTREMITY ANGIOGRAPHY;  Surgeon: Algernon Huxley, MD;  Location: [place] [facility];  Service: Cardiovascular;  Laterality: Left;   LOWER EXTREMITY ANGIOGRAPHY Right 02/22/2022   Procedure: Lower Extremity Angiography;  Surgeon: Algernon Huxley, MD;  Location: [place] [facility];  Service: Cardiovascular;  Laterality: Right;   POLYPECTOMY N/A 01/18/2020   Procedure: POLYPECTOMY;  Surgeon: [name], MD;  Location: [place];  Service: Endoscopy;  Laterality: N/A;    Allergies: Allergies as of 10/02/2022 - Review Complete 09/17/2022  Allergen Reaction Noted   Contrast media [iodinated contrast media] Hives 11/09/2019   Iodine Hives 11/09/2019    Medications: No outpatient medications have been marked as taking for the 10/02/22 encounter (Appointment) with Meade Maw, MD.    Social History: Social History   Tobacco Use   Smoking status: Every Day    Packs/day: 0.50    Years: 46.00    Additional pack years: 0.00    Total pack years: 23.00    Types: Cigarettes   Smokeless tobacco: Never   Tobacco comments:    Pt down to 1/2 PPD 05/24/21  Vaping Use   Vaping Use:  Never used  Substance Use Topics   Alcohol use: Yes    Comment: quit drinking around 04/2019(May have drink 1x/mo)   Drug use: No    Family Medical History: Family History  Problem Relation Age of Onset   Hypertension Mother    Diabetes Mother    Heart failure Father        age 22   Cancer Father    Ovarian cancer Sister    Heart disease Brother     Physical Examination: There were no vitals filed for this visit.  General: Patient is well developed, well nourished, calm, collected, and in no apparent distress. Attention to examination is appropriate.  Neck:   Supple.  Full range of motion.  Respiratory: Patient is breathing without any difficulty.   NEUROLOGICAL:     Awake, alert, oriented to person, place, and time.  Speech is clear and fluent.   Cranial Nerves: Pupils equal round and reactive to light.  Facial tone is symmetric.  Facial sensation is symmetric. Shoulder shrug is symmetric. Tongue protrusion is midline.  There is no pronator drift.  ROM of spine: full.    Strength: Side Biceps Triceps Deltoid Interossei Grip Wrist Ext. Wrist Flex.  R 5 5 5 5 5 5 5   L 5 5 5  5 5 5 5    Side Iliopsoas Quads Hamstring PF DF EHL  R 5 5 5 5 5 5   L 5 5 5 5 5 5    Reflexes are ***2+ and symmetric at the biceps, triceps, brachioradialis, patella and achilles.   Hoffman's is absent.   Bilateral upper and lower extremity sensation is intact to light touch.    No evidence of dysmetria noted.  Gait is normal.     Medical Decision Making  Imaging: ***  I have personally reviewed the images and agree with the above interpretation.  Assessment and Plan: Mr. Chaudoin is a pleasant 67 y.o. male with ***    Thank you for involving me in the care of this patient.      Chester K. Izora Ribas MD, [facility]

## 2022-10-02 ENCOUNTER — Ambulatory Visit: Payer: Medicare HMO | Admitting: Neurosurgery

## 2022-10-05 ENCOUNTER — Ambulatory Visit: Payer: PPO | Admitting: Internal Medicine

## 2022-10-08 ENCOUNTER — Encounter: Payer: Self-pay | Admitting: Internal Medicine

## 2022-10-08 ENCOUNTER — Ambulatory Visit (INDEPENDENT_AMBULATORY_CARE_PROVIDER_SITE_OTHER): Payer: Medicare HMO | Admitting: Internal Medicine

## 2022-10-08 VITALS — BP 118/68 | HR 77 | Ht 73.0 in | Wt 185.0 lb

## 2022-10-08 DIAGNOSIS — I25119 Atherosclerotic heart disease of native coronary artery with unspecified angina pectoris: Secondary | ICD-10-CM

## 2022-10-08 DIAGNOSIS — F17200 Nicotine dependence, unspecified, uncomplicated: Secondary | ICD-10-CM

## 2022-10-08 DIAGNOSIS — I10 Essential (primary) hypertension: Secondary | ICD-10-CM

## 2022-10-08 DIAGNOSIS — I70222 Atherosclerosis of native arteries of extremities with rest pain, left leg: Secondary | ICD-10-CM

## 2022-10-08 DIAGNOSIS — J449 Chronic obstructive pulmonary disease, unspecified: Secondary | ICD-10-CM | POA: Diagnosis not present

## 2022-10-08 DIAGNOSIS — I739 Peripheral vascular disease, unspecified: Secondary | ICD-10-CM

## 2022-10-08 DIAGNOSIS — R7303 Prediabetes: Secondary | ICD-10-CM | POA: Diagnosis not present

## 2022-10-08 NOTE — Assessment & Plan Note (Addendum)
Recently completed Doxycycline and prednisone Still has a cough but sputum is now clear. Lung exam is clear - no indication for additional antibiotics

## 2022-10-08 NOTE — Progress Notes (Signed)
Date:  10/08/2022   Name:  Aaron [name]   DOB:  June 16, 1956   MRN:  [id number]   Chief Complaint: Hypertension  Hypertension This is a chronic problem. The problem is controlled. Pertinent negatives include no chest pain, headaches, palpitations or shortness of breath. Past treatments include beta blockers, ACE inhibitors and direct vasodilators. Hypertensive end-organ damage includes CAD/MI. There is no history of kidney disease or CVA.  Diabetes He presents for his follow-up diabetic visit. Diabetes type: prediabetes. Pertinent negatives for hypoglycemia include no dizziness, headaches or nervousness/anxiousness. Pertinent negatives for diabetes include no chest pain, no fatigue and no weakness. Pertinent negatives for diabetic complications include no CVA. His weight is decreasing steadily.  COPD flare - recently completely doxycycline and steroid taper.  Wondering if he needs more antibiotics since he is still coughing.  The sputum is now clear and he is not short of breath.  Lab Results  Component Value Date   NA 130 (L) 09/17/2022   K 4.0 09/17/2022   CO2 23 09/17/2022   GLUCOSE 142 (H) 09/17/2022   BUN 19 09/17/2022   CREATININE 0.87 09/17/2022   CALCIUM 9.2 09/17/2022   EGFR 91 04/06/2022   GFRNONAA >60 09/17/2022   Lab Results  Component Value Date   CHOL 144 04/06/2022   HDL 39 (L) 04/06/2022   LDLCALC 78 04/06/2022   TRIG 155 (H) 04/06/2022   CHOLHDL 3.7 04/06/2022   Lab Results  Component Value Date   TSH 3.214 02/06/2019   Lab Results  Component Value Date   HGBA1C 6.0 (H) 04/06/2022   Lab Results  Component Value Date   WBC 7.4 09/17/2022   HGB 15.7 09/17/2022   HCT 46.9 09/17/2022   MCV 92.7 09/17/2022   PLT 225 09/17/2022   Lab Results  Component Value Date   ALT 28 04/06/2022   AST 22 04/06/2022   ALKPHOS 84 04/06/2022   BILITOT 0.4 04/06/2022   No results found for: "25OHVITD2", "25OHVITD3", "VD25OH"   Review of Systems   Constitutional:  Negative for fatigue and unexpected weight change.  HENT:  Negative for nosebleeds.   Eyes:  Negative for visual disturbance.  Respiratory:  Positive for cough. Negative for chest tightness, shortness of breath and wheezing.   Cardiovascular:  Negative for chest pain, palpitations and leg swelling.  Gastrointestinal:  Negative for abdominal pain, constipation and diarrhea.  Neurological:  Negative for dizziness, weakness, light-headedness and headaches.  Psychiatric/Behavioral:  Negative for agitation and dysphoric mood. The patient is not nervous/anxious.     Patient Active Problem List   Diagnosis Date Noted   Atherosclerosis of native arteries of extremities with rest pain, left leg 10/08/2022   Atherosclerosis of artery of extremity with intermittent claudication 04/11/2022   Hepatic steatosis 01/04/2022   Aortic atherosclerosis 07/10/2020   Stage 2 moderate COPD by GOLD classification 05/16/2020   Dyspepsia and disorder of function of stomach 05/16/2020   Cervical disc disorder of mid-cervical region 05/10/2020   Polyp of descending colon    Mixed hyperlipidemia 10/28/2018   Atherosclerosis of native arteries of extremity with intermittent claudication 07/13/2016   Tobacco use disorder 07/13/2016   Prediabetes 07/27/2015   Controlled gout 12/22/2014   Coronary artery disease involving native coronary artery of native heart with angina pectoris 10/09/2014   Essential (primary) hypertension 10/09/2014   Palmar fascial fibromatosis (dupuytren) 10/09/2014   Psoriasis 10/09/2014   H/O cardiac catheterization 01/27/2014   Peripheral vascular disease ([place]) 01/27/2014    Allergies  Allergen Reactions   Contrast Media [Iodinated Contrast Media] Hives   Iodine Hives    Past Surgical History:  Procedure Laterality Date   APPENDECTOMY     CATARACT EXTRACTION W/PHACO Right 10/26/2021   Procedure: CATARACT EXTRACTION PHACO AND INTRAOCULAR LENS PLACEMENT ([place])  RIGHT OMIDRIA  10.98 01:18.2;  Surgeon: [name], MD;  Location: [place] [facility];  Service: Ophthalmology;  Laterality: Right;   CATARACT EXTRACTION W/PHACO Left 11/16/2021   Procedure: CATARACT EXTRACTION PHACO AND INTRAOCULAR LENS PLACEMENT ([place]) LEFT;  Surgeon: [name], MD;  Location: [place];  Service: Ophthalmology;  Laterality: Left;  6.91 0:46.0   COLONOSCOPY  03/30/2013   [place]   COLONOSCOPY WITH PROPOFOL N/A 12/10/2019   Procedure: COLONOSCOPY WITH PROPOFOL;  Surgeon: [name], MD;  Location: [facility];  Service: Endoscopy;  Laterality: N/A;   COLONOSCOPY WITH PROPOFOL N/A 01/18/2020   Procedure: COLONOSCOPY WITH PROPOFOL;  Surgeon: [name], MD;  Location: [place];  Service: Endoscopy;  Laterality: N/A;  4   CORONARY ANGIOPLASTY WITH STENT PLACEMENT Left    Procedure: CORONARY ANGIOPLASTY WITH STENT PLACEMENT   ENDARTERECTOMY FEMORAL Left 04/11/2022   Procedure: ENDARTERECTOMY FEMORAL ( SFA & TIBIAL); insertion of SFA stent;  Surgeon: Algernon Huxley, MD;  Location: [facility];  Service: Vascular;  Laterality: Left;   LEFT HEART CATH AND CORONARY ANGIOGRAPHY N/A 12/29/2020   Procedure: LEFT HEART CATH AND CORONARY ANGIOGRAPHY;  Surgeon: Corey Skains, MD;  Location: [facility];  Service: Cardiovascular;  Laterality: N/A;   LEFT HEART CATH AND CORONARY ANGIOGRAPHY Left 06/19/2010   Procedure: LEFT HEART CATH AND CORONARY ANGIOGRAPHY; Location: [place]; Surgeon: Lowanda Foster, MD   LEFT HEART CATH AND CORONARY ANGIOGRAPHY Left 07/08/2014   Procedure: LEFT HEART CATH AND CORONARY ANGIOGRAPHY; Location: [place]; Surgeon: Isaias Cowman, MD   LOWER EXTREMITY ANGIOGRAM Left 04/11/2022   Procedure: LOWER EXTREMITY ANGIOGRAM;  Surgeon: Algernon Huxley, MD;  Location: [facility];  Service: Vascular;  Laterality: Left;   LOWER EXTREMITY ANGIOGRAPHY Right 09/25/2018   Procedure: LOWER EXTREMITY ANGIOGRAPHY;  Surgeon:  Algernon Huxley, MD;  Location: [facility];  Service: Cardiovascular;  Laterality: Right;   LOWER EXTREMITY ANGIOGRAPHY Left 03/17/2020   Procedure: LOWER EXTREMITY ANGIOGRAPHY;  Surgeon: Algernon Huxley, MD;  Location: [facility];  Service: Cardiovascular;  Laterality: Left;   LOWER EXTREMITY ANGIOGRAPHY Right 02/22/2022   Procedure: Lower Extremity Angiography;  Surgeon: Algernon Huxley, MD;  Location: [facility];  Service: Cardiovascular;  Laterality: Right;   POLYPECTOMY N/A 01/18/2020   Procedure: POLYPECTOMY;  Surgeon: [name], MD;  Location: [place];  Service: Endoscopy;  Laterality: N/A;    Social History   Tobacco Use   Smoking status: Every Day    Packs/day: 0.50    Years: 46.00    Additional pack years: 0.00    Total pack years: 23.00    Types: Cigarettes   Smokeless tobacco: Never   Tobacco comments:    Pt down to 1/2 PPD 05/24/21  Vaping Use   Vaping Use: Never used  Substance Use Topics   Alcohol use: Yes    Comment: quit drinking around 04/2019(May have drink 1x/mo)   Drug use: No     Medication list has been reviewed and updated.  Current Meds  Medication Sig   albuterol (VENTOLIN HFA) 108 (90 Base) MCG/ACT inhaler Inhale 2 puffs into the lungs every 6 (six) hours as needed for  wheezing or shortness of breath.   aspirin 325 MG tablet Take 325 mg by mouth daily.   betamethasone valerate (VALISONE) 0.1 % cream Apply topically 2 (two) times daily. To rash on buttocks   cilostazol (PLETAL) 50 MG tablet Take by mouth 2 (two) times daily.    clopidogrel (PLAVIX) 75 MG tablet Take 1 tablet by mouth daily.   indomethacin (INDOCIN) 25 MG capsule Take 1 capsule (25 mg total) by mouth 2 (two) times daily as needed.   isosorbide mononitrate (IMDUR) 120 MG 24 hr tablet Take 1 tablet (120 mg total) by mouth daily.   lisinopril (PRINIVIL,ZESTRIL) 10 MG tablet Take 1 tablet by mouth daily.   metoprolol tartrate (LOPRESSOR) 50 MG tablet  Take 50-75 mg by mouth 2 (two) times a day. Take 50MG  by mouth every morning and 75MG  every evening   nitroGLYCERIN (NITROSTAT) 0.4 MG SL tablet Place 1 tablet under the tongue as needed.   psyllium (METAMUCIL) 58.6 % packet Take 1 packet by mouth daily.   simvastatin (ZOCOR) 40 MG tablet Take 1 tablet (40 mg total) by mouth at bedtime.       10/08/2022    1:26 PM 08/13/2022    2:47 PM 08/01/2022    2:06 PM 06/28/2022   10:52 AM  GAD 7 : Generalized Anxiety Score  Nervous, Anxious, on Edge 0 0 0 0  Control/stop worrying 0 0 0 0  Worry too much - different things 0 0 0 0  Trouble relaxing 0 0 0 0  Restless 0 0 0 0  Easily annoyed or irritable 0 0 0 0  Afraid - awful might happen 0 0 0 0  Total GAD 7 Score 0 0 0 0  Anxiety Difficulty Not difficult at all Not difficult at all Not difficult at all Not difficult at all       10/08/2022    1:25 PM 08/13/2022    2:47 PM 08/01/2022    2:06 PM  Depression screen PHQ 2/9  Decreased Interest 0 0 0  Down, Depressed, Hopeless 0 0 0  PHQ - 2 Score 0 0 0  Altered sleeping 0 0 0  Tired, decreased energy 0 0 0  Change in appetite 0 0 0  Feeling bad or failure about yourself  0 0 0  Trouble concentrating 0 0 0  Moving slowly or fidgety/restless 0 0 0  Suicidal thoughts 0 0 0  PHQ-9 Score 0 0 0  Difficult doing work/chores Not difficult at all Not difficult at all     BP Readings from Last 3 Encounters:  10/08/22 118/68  09/17/22 134/72  08/13/22 116/72    Physical Exam Vitals and nursing note reviewed.  Constitutional:      General: He is not in acute distress.    Appearance: He is well-developed.  HENT:     Head: Normocephalic and atraumatic.  Cardiovascular:     Rate and Rhythm: Normal rate and regular rhythm.  Pulmonary:     Effort: Pulmonary effort is normal. No respiratory distress.     Breath sounds: Decreased breath sounds present. No wheezing or rhonchi.  Musculoskeletal:     Cervical back: Normal range of motion.      Right lower leg: No edema.     Left lower leg: No edema.  Lymphadenopathy:     Cervical: No cervical adenopathy.  Skin:    General: Skin is warm and dry.     Capillary Refill: Capillary refill takes less than 2 seconds.  Findings: No rash.  Neurological:     General: No focal deficit present.     Mental Status: He is alert and oriented to person, place, and time.  Psychiatric:        Mood and Affect: Mood normal.        Behavior: Behavior normal.     Wt Readings from Last 3 Encounters:  10/08/22 185 lb (83.9 kg)  09/17/22 187 lb (84.8 kg)  08/13/22 191 lb (86.6 kg)    BP 118/68   Pulse 77   Ht 6\' 1"  (1.854 m)   Wt 185 lb (83.9 kg)   SpO2 97%   BMI 24.41 kg/m   Assessment and Plan:  Problem List Items Addressed This Visit       Cardiovascular and Mediastinum   Atherosclerosis of native arteries of extremities with rest pain, left leg   Coronary artery disease involving native coronary artery of native heart with angina pectoris (Chronic)    Doing well s/p PTCA and stents On Plavix and ImDur      Essential (primary) hypertension - Primary (Chronic)    Clinically stable exam with well controlled BP on metoprolol and lisinopril. Tolerating medications without side effects. Pt to continue current regimen and low sodium diet.       Peripheral vascular disease (H[facility]) (Chronic)    Doing well s/p revascularization Walking the dog 70 minutes three times a day!        Respiratory   Stage 2 moderate COPD by GOLD classification (Chronic)    Recently completed Doxycycline and prednisone Still has a cough but sputum is now clear. Lung exam is clear - no indication for additional antibiotics        Other   Prediabetes (Chronic)    Last A1C 6.0 03/2022 Recommend continued low carb diet. Check A1C next visit       Tobacco use disorder (Chronic)    He has chantix on hand and plans to start this next week.        Return in about 6 months (around 04/09/2023)  for CPX.  No labs needed today.  Partially dictated using [place], any errors are not intentional.  Glean Hess, MD [place], [place]

## 2022-10-08 NOTE — Assessment & Plan Note (Signed)
He has chantix on hand and plans to start this next week.

## 2022-10-08 NOTE — Assessment & Plan Note (Signed)
Clinically stable exam with well controlled BP on metoprolol and lisinopril. Tolerating medications without side effects. Pt to continue current regimen and low sodium diet.  

## 2022-10-08 NOTE — Assessment & Plan Note (Addendum)
Last A1C 6.0 03/2022 Recommend continued low carb diet. Check A1C next visit

## 2022-10-08 NOTE — Assessment & Plan Note (Addendum)
Doing well s/p revascularization Walking the dog 70 minutes three times a day!

## 2022-10-08 NOTE — Assessment & Plan Note (Signed)
Doing well s/p PTCA and stents On Plavix and ImDur

## 2022-10-31 NOTE — Progress Notes (Unsigned)
Referring Physician:  No referring provider defined for this encounter.  Primary Physician:  Reubin Milan, MD  History of Present Illness: 11/01/2022 Aaron [name] is here today with a chief complaint of intermittent numbness and tingling.  It happens 1-2 times a day.  It impacts his right arm more than his left and hits all 5 of his fingers.  He does not have any pain in his neck or in his shoulder blades or arms.  He denies any problems with balance or dexterity.  He did have a trauma when he was 18 and suffered a cervical fracture.  bowel/Bladder Dysfunction: none  Conservative measures:  Physical therapy:  has not participated Multimodal medical therapy including regular antiinflammatories:  none Injections:  has not received epidural steroid injections  Past Surgery: denies  Aaron [name] has no symptoms of cervical myelopathy.  The symptoms are causing a significant impact on the patient's life.   I have utilized the care everywhere function in epic to review the outside records available from external health systems.  Review of Systems:  A 10 point review of systems is negative, except for the pertinent positives and negatives detailed in the HPI.  Past Medical History: Past Medical History:  Diagnosis Date   Aortic atherosclerosis    COPD (chronic obstructive pulmonary disease)    Coronary artery disease 06/19/2010   a.) LHC/PCI (unknown date) --> stent to pRCA and pLAD (unknown type); b.) LHC 06/19/2010: 30 mLM, 60 mLAD, 60 pLCx, 50 pRI, 40 ISR pRCA, 50 dRCA, 50 RPDA, 50 RPLS --> med mgmt; c.) LHC 07/08/2014: 10 pLM, 30 dLAD, 75 oLCx, 30 mRCA, 40 RPDA, patent RCA stent --> med mgmt; d.) LHC 12/28/2021: 50 mRCA, 65 RPDA, 45 m-dRCA, 60 o-pRCA, 95 o-pLCx, 60 RI, 30 pLAD, 30 mLM --> med mgmt.   Diastolic dysfunction 09/16/2017   a.) TTE 09/16/2017: ED 55-60%, triv AR/MR, G1DD   DVT (deep venous thrombosis) 04/10/2022   left groin   Fracture of left  ankle, closed, initial encounter 10/23/2017   GERD (gastroesophageal reflux disease)    Glaucoma    Gout    Hepatic steatosis    Hypercholesteremia    Hypertension    Long term current use of antithrombotics/antiplatelets    a.) cilostazol + full dose ASA + clopidogrel   PAD (peripheral artery disease)    a.) s/p multiple PTA procedures to lower extremities   Pain in limb 07/13/2016   Syncope 09/16/2017   Unstable angina 01/12/2017   Vertigo     Past Surgical History: Past Surgical History:  Procedure Laterality Date   APPENDECTOMY     CATARACT EXTRACTION W/PHACO Right 10/26/2021   Procedure: CATARACT EXTRACTION PHACO AND INTRAOCULAR LENS PLACEMENT (IOC) RIGHT OMIDRIA  10.98 01:18.2;  Surgeon: Estanislado Pandy, MD;  Location: [facility];  Service: Ophthalmology;  Laterality: Right;   CATARACT EXTRACTION W/PHACO Left 11/16/2021   Procedure: CATARACT EXTRACTION PHACO AND INTRAOCULAR LENS PLACEMENT (IOC) LEFT;  Surgeon: Estanislado Pandy, MD;  Location: [facility];  Service: Ophthalmology;  Laterality: Left;  6.91 0:46.0   COLONOSCOPY  03/30/2013   [place]   COLONOSCOPY WITH PROPOFOL N/A 12/10/2019   Procedure: COLONOSCOPY WITH PROPOFOL;  Surgeon: Midge Minium, MD;  Location: [facility];  Service: Endoscopy;  Laterality: N/A;   COLONOSCOPY WITH PROPOFOL N/A 01/18/2020   Procedure: COLONOSCOPY WITH PROPOFOL;  Surgeon: Midge Minium, MD;  Location: [facility];  Service: Endoscopy;  Laterality: N/A;  4  CORONARY ANGIOPLASTY WITH STENT PLACEMENT Left    Procedure: CORONARY ANGIOPLASTY WITH STENT PLACEMENT   ENDARTERECTOMY FEMORAL Left 04/11/2022   Procedure: ENDARTERECTOMY FEMORAL ( SFA & TIBIAL); insertion of SFA stent;  Surgeon: Annice Needy, MD;  Location: [facility] OR[facility];  Service: Vascular;  Laterality: Left;   LEFT HEART CATH AND CORONARY ANGIOGRAPHY N/A 12/29/2020   Procedure: LEFT HEART CATH AND CORONARY ANGIOGRAPHY;  Surgeon: Lamar Blinks, MD;  Location: [facility] IN[facility];  Service: Cardiovascular;  Laterality: N/A;   LEFT HEART CATH AND CORONARY ANGIOGRAPHY Left 06/19/2010   Procedure: LEFT HEART CATH AND CORONARY ANGIOGRAPHY; Location: [facility]; Surgeon: Despina Hick, MD   LEFT HEART CATH AND CORONARY ANGIOGRAPHY Left 07/08/2014   Procedure: LEFT HEART CATH AND CORONARY ANGIOGRAPHY; Location: [facility]; Surgeon: Marcina Millard, MD   LOWER EXTREMITY ANGIOGRAM Left 04/11/2022   Procedure: LOWER EXTREMITY ANGIOGRAM;  Surgeon: Annice Needy, MD;  Location: [facility];  Service: Vascular;  Laterality: Left;   LOWER EXTREMITY ANGIOGRAPHY Right 09/25/2018   Procedure: LOWER EXTREMITY ANGIOGRAPHY;  Surgeon: Annice Needy, MD;  Location: [facility] CV [facility];  Service: Cardiovascular;  Laterality: Right;   LOWER EXTREMITY ANGIOGRAPHY Left 03/17/2020   Procedure: LOWER EXTREMITY ANGIOGRAPHY;  Surgeon: Annice Needy, MD;  Location: [facility] IN[facility];  Service: Cardiovascular;  Laterality: Left;   LOWER EXTREMITY ANGIOGRAPHY Right 02/22/2022   Procedure: Lower Extremity Angiography;  Surgeon: Annice Needy, MD;  Location: [facility] IN[facility] CV LAB;  Service: Cardiovascular;  Laterality: Right;   POLYPECTOMY N/A 01/18/2020   Procedure: POLYPECTOMY;  Surgeon: Midge Minium, MD;  Location: [facility];  Service: Endoscopy;  Laterality: N/A;    Allergies: Allergies as of 11/01/2022 - Review Complete 11/01/2022  Allergen Reaction Noted   Contrast media [iodinated contrast media] Hives 11/09/2019   Iodine Hives 11/09/2019    Medications:  Current Outpatient Medications:    albuterol (VENTOLIN HFA) 108 (90 Base) MCG/ACT inhaler, Inhale 2 puffs into the lungs every 6 (six) hours as needed for wheezing or shortness of breath., Disp: 6.7 g, Rfl: 5   aspirin 325 MG tablet, Take 325 mg by mouth daily., Disp: , Rfl:    betamethasone valerate (VALISONE) 0.1 % cream, Apply topically 2 (two) times daily. To rash on buttocks, Disp: 30 g, Rfl:  0   cilostazol (PLETAL) 50 MG tablet, Take by mouth 2 (two) times daily. , Disp: , Rfl:    clopidogrel (PLAVIX) 75 MG tablet, Take 1 tablet by mouth daily., Disp: , Rfl:    indomethacin (INDOCIN) 25 MG capsule, Take 1 capsule (25 mg total) by mouth 2 (two) times daily as needed., Disp: 30 capsule, Rfl: 1   isosorbide mononitrate (IMDUR) 120 MG 24 hr tablet, Take 1 tablet (120 mg total) by mouth daily., Disp: 30 tablet, Rfl: 0   lisinopril (PRINIVIL,ZESTRIL) 10 MG tablet, Take 1 tablet by mouth daily., Disp: , Rfl:    metoprolol tartrate (LOPRESSOR) 50 MG tablet, Take 50-75 mg by mouth 2 (two) times a day. Take 50MG  by mouth every morning and 75MG  every evening, Disp: , Rfl:    nitroGLYCERIN (NITROSTAT) 0.4 MG SL tablet, Place 1 tablet under the tongue as needed., Disp: , Rfl:    psyllium (METAMUCIL) 58.6 % packet, Take 1 packet by mouth daily., Disp: , Rfl:    simvastatin (ZOCOR) 40 MG tablet, Take 1 tablet (40 mg total) by mouth at bedtime., Disp: 30 tablet, Rfl: 5  Social History: Social History   Tobacco Use  Smoking status: Every Day    Types: Cigars   Smokeless tobacco: Never  Vaping Use   Vaping Use: Never used  Substance Use Topics   Alcohol use: Yes    Comment: quit drinking around 04/2019(May have drink 1x/mo)   Drug use: No    Family Medical History: Family History  Problem Relation Age of Onset   Hypertension Mother    Diabetes Mother    Heart failure Father        age 54   Cancer Father    Ovarian cancer Sister    Heart disease Brother     Physical Examination: Vitals:   11/01/22 1041  BP: 120/70    General: Patient is well developed, well nourished, calm, collected, and in no apparent distress. Attention to examination is appropriate.  Neck:   Supple.  Full range of motion.  Respiratory: Patient is breathing without any difficulty.   NEUROLOGICAL:     Awake, alert, oriented to person, place, and time.  Speech is clear and fluent.   Cranial Nerves:  Pupils equal round and reactive to light.  Facial tone is symmetric.  Facial sensation is symmetric. Shoulder shrug is symmetric. Tongue protrusion is midline.  There is no pronator drift.  He has bilateral Dupuytren's contractures impacting his pinkies.  ROM of spine: full.    Strength: Side Biceps Triceps Deltoid Interossei Grip Wrist Ext. Wrist Flex.  R L Side Iliopsoas Quads Hamstring PF DF EHL  R L Reflexes are 1+ and symmetric at the biceps, triceps, brachioradialis, patella and achilles.   Hoffman's is absent.   Bilateral upper and lower extremity sensation is intact to light touch.    No evidence of dysmetria noted.  Gait is normal.     Medical Decision Making  Imaging: MRI C spine 09/17/2022 Narrative & Impression  CLINICAL DATA:  Myelopathy, acute, cervical spine; Headache, neuro deficit   EXAM: MRI HEAD WITHOUT CONTRAST   MRI CERVICAL SPINE WITHOUT CONTRAST   TECHNIQUE: Multiplanar, multiecho pulse sequences of the brain and surrounding structures, and cervical spine, to include the craniocervical junction and cervicothoracic junction, were obtained without intravenous contrast.   COMPARISON:  None Available.   FINDINGS: MRI HEAD FINDINGS   Brain: No acute infarction, hemorrhage, hydrocephalus, extra-axial collection or mass lesion. Small remote infarct in the left middle cerebellar peduncle.   Vascular: Major arterial flow voids are maintained at the skull base.   Skull and upper cervical spine: Normal marrow signal.   Sinuses/Orbits: Right maxillary sinus air-fluid level. No acute orbital findings.   Other: Mild bilateral mastoid effusions.   MRI CERVICAL SPINE FINDINGS   Alignment: Normal.  No substantial sagittal subluxation.   Vertebrae: Degenerative/discogenic endplate signal changes. No specific evidence of acute fracture or discitis/osteomyelitis.   Cord: Normal cord  signal.   Posterior Fossa, vertebral arteries, paraspinal tissues: Visualized vertebral artery flow voids are maintained. Small cyst in the right vallecula.   Disc levels:   C2-C3: No significant disc protrusion, foraminal stenosis, or canal stenosis.   C3-C4: Bilateral facet arthropathy and uncovertebral hypertrophy. Resulting mild-to-moderate right and mild left foraminal stenosis. Patent canal.   C4-C5: Posterior disc osteophyte complex, eccentric to the left. Left greater than right facet and uncovertebral hypertrophy resulting in moderate left foraminal stenosis. Disc contacts and flattens the ventral cord  with mild canal stenosis.   C5-C6: Right greater than left facet and uncovertebral hypertrophy with mild right foraminal stenosis. Patent canal and left foramen.   C6-C7: Small posterior disc osteophyte complex with right greater than left facet and uncovertebral hypertrophy. Moderate right and mild left foraminal stenosis. Patent canal.   C7-T1: No significant disc protrusion, foraminal stenosis, or canal stenosis.   IMPRESSION: 1. No evidence of acute intracranial abnormality. 2. Moderate foraminal stenosis on the left at C4-C5 and the right at C6-C7. Mild canal stenosis at C4-C5 with disc contacting and flattening the cord. 3. Milder multilevel degenerative changes detailed above.     Electronically Signed   By: Feliberto Harts M.D.   On: 09/17/2022 14:11    I have personally reviewed the images and agree with the above interpretation.  Assessment and Plan: Mr. Frangos is a pleasant 67 y.o. male with Dupuytren's contractures of both fingers.  He has intermittent numbness that is likely secondary to radicular compression.  He has no pain currently.  At this point, I do not think he has any symptoms that would require surgical intervention.  I encouraged him to watch his symptoms and present for reevaluation should he have onset of nerve root related pain,  worsening dexterity, or increasing neck pain.  At that point, we can reevaluate whether surgical intervention is reasonable.  I have not sent him for physical therapy as currently his symptoms are not life limiting.  I will refer him to Ortho for evaluation of his contractures.  I spent a total of 30 minutes in this patient's care today. This time was spent reviewing pertinent records including imaging studies, obtaining and confirming history, performing a directed evaluation, formulating and discussing my recommendations, and documenting the visit within the medical record.      Thank you for involving me in the care of this patient.      Margel Joens K. Myer Haff MD, [facility] Neurosurgery

## 2022-11-01 ENCOUNTER — Encounter: Payer: Self-pay | Admitting: Neurosurgery

## 2022-11-01 ENCOUNTER — Ambulatory Visit: Payer: Medicare HMO | Admitting: Neurosurgery

## 2022-11-01 VITALS — BP 120/70 | Ht 73.0 in | Wt 189.4 lb

## 2022-11-01 DIAGNOSIS — M5412 Radiculopathy, cervical region: Secondary | ICD-10-CM | POA: Diagnosis not present

## 2022-11-01 DIAGNOSIS — M720 Palmar fascial fibromatosis [Dupuytren]: Secondary | ICD-10-CM | POA: Diagnosis not present

## 2022-11-11 ENCOUNTER — Other Ambulatory Visit: Payer: Self-pay | Admitting: Acute Care

## 2022-11-11 DIAGNOSIS — F17210 Nicotine dependence, cigarettes, uncomplicated: Secondary | ICD-10-CM

## 2022-11-11 DIAGNOSIS — Z122 Encounter for screening for malignant neoplasm of respiratory organs: Secondary | ICD-10-CM

## 2022-11-11 DIAGNOSIS — Z87891 Personal history of nicotine dependence: Secondary | ICD-10-CM

## 2022-11-16 IMAGING — CR DG LUMBAR SPINE COMPLETE 4+V
5 series · 5 of 5 positions shown · non-contrast
Comparison: CT chest 01/26/2021.

CLINICAL DATA: Low back pain status post fall.

EXAM:
LUMBAR SPINE - COMPLETE 4+ VIEW

[l-spine ap]
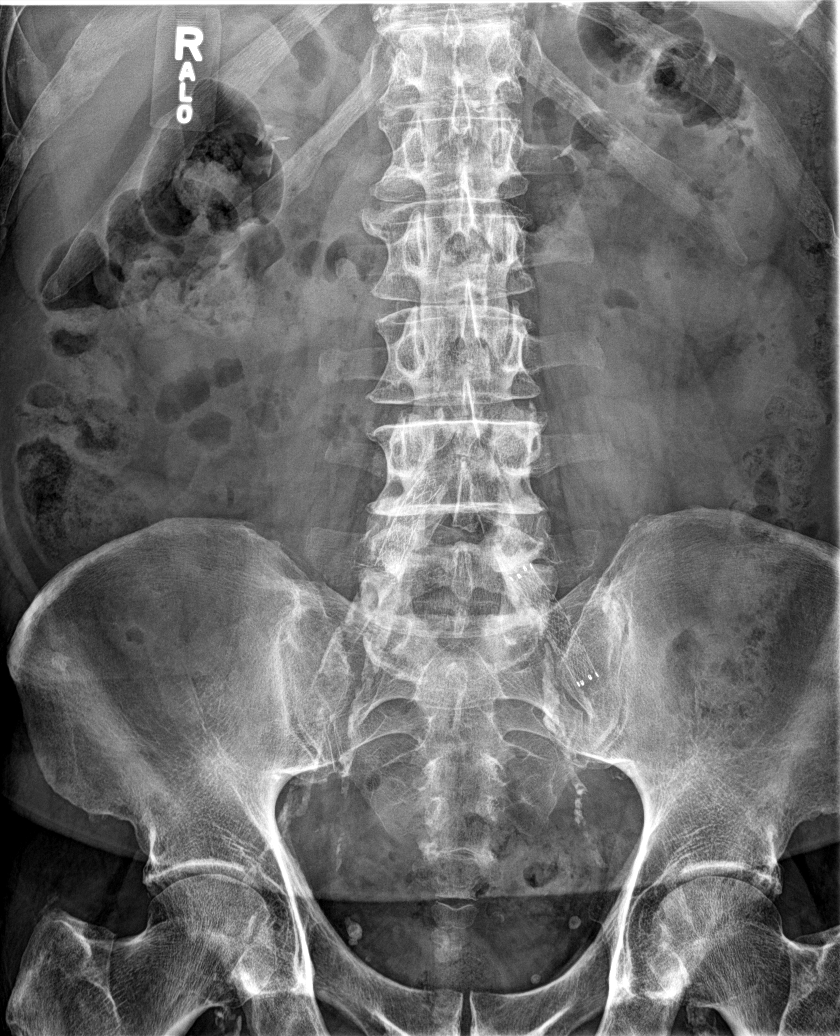

[l-spine obl (1 of 2)]
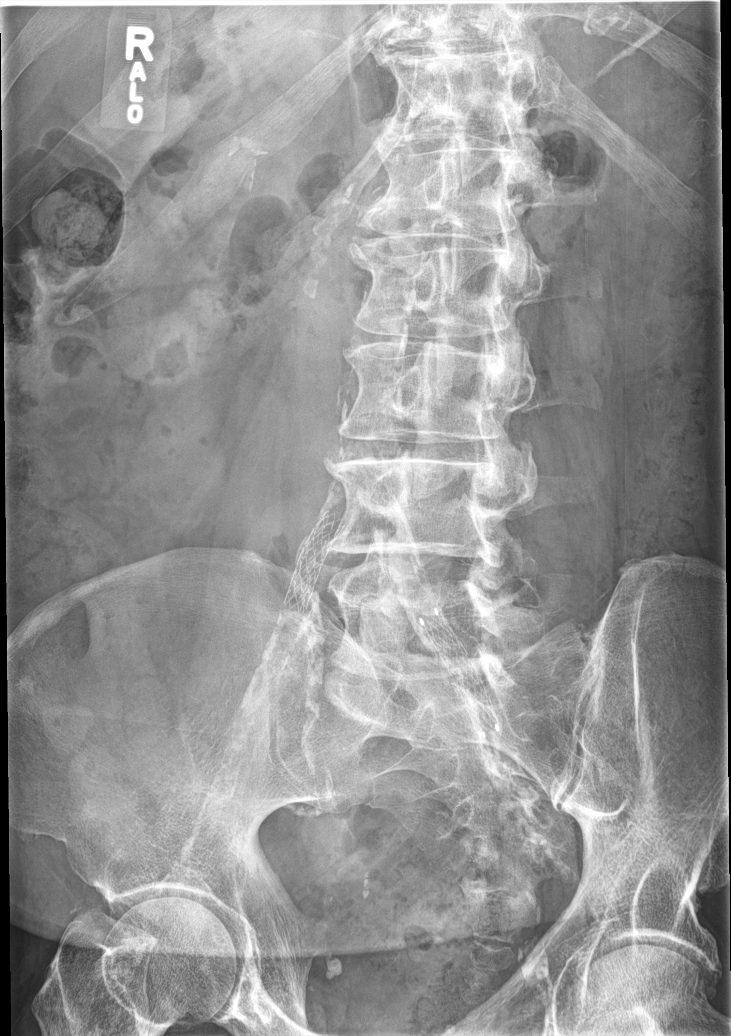

[l-spine obl (2 of 2)]
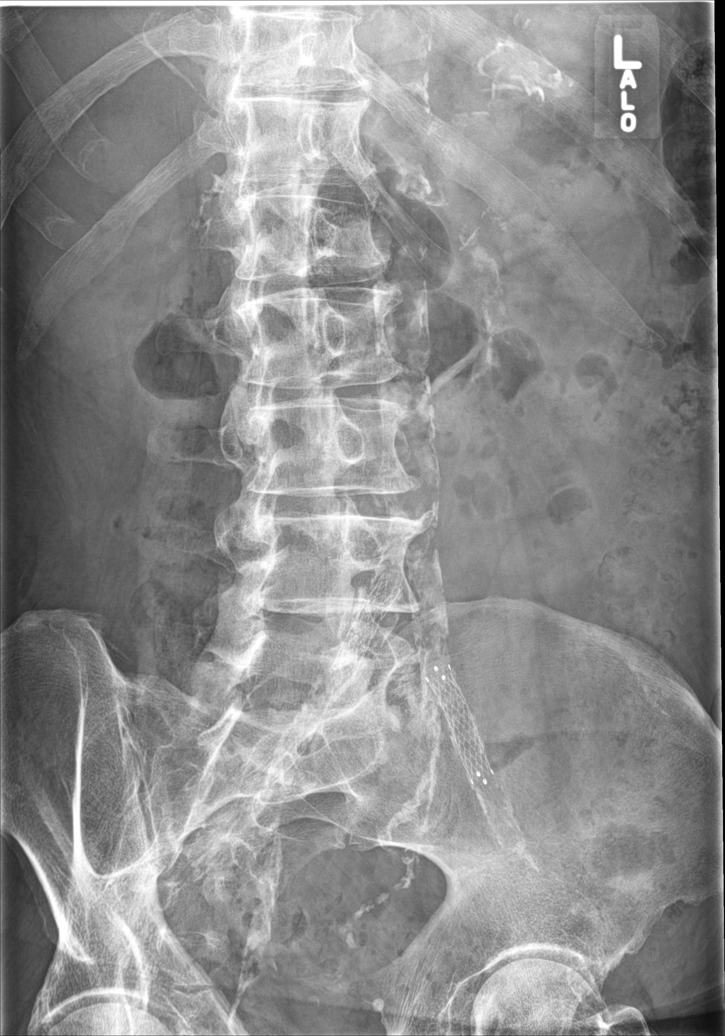

[l-spine lat]
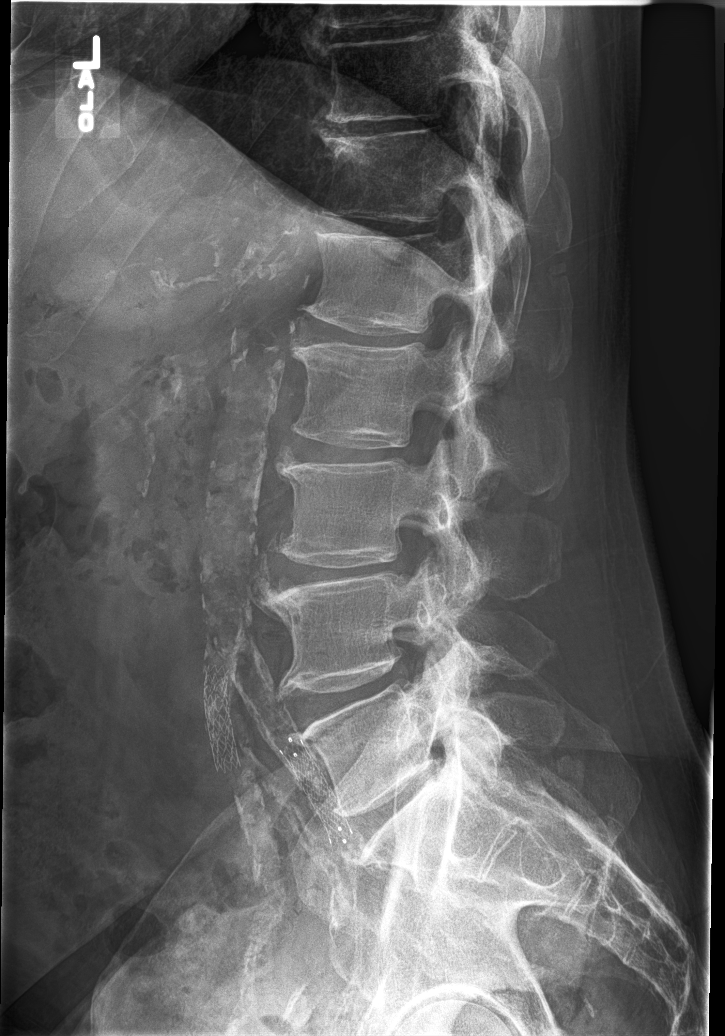

[l-spine spot]
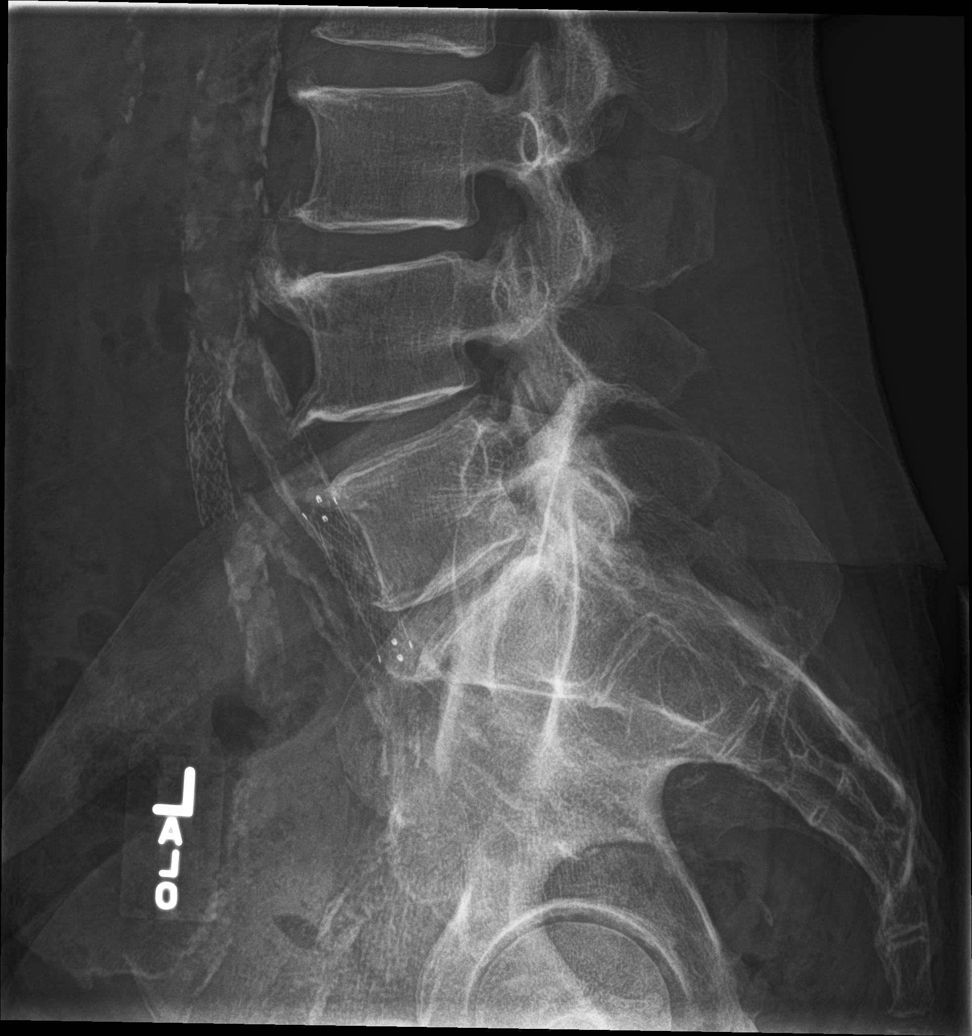

[5 of 5 positions shown; findings below may reference images not displayed]

FINDINGS: Diffuse multilevel degeneration endplate osteophyte formation. No
acute bony abnormality identified. No evidence of fracture.
Degenerative changes both hips. Small rounded bony density noted the
right ilium most likely benign bone island. Aortoiliac and visceral
atherosclerotic vascular calcification. Bilateral iliac stents
noted. Pelvic calcifications consistent phleboliths. Large amount of
stool noted throughout the colon.
IMPRESSION: Diffuse degenerative change. No acute abnormality. No evidence of
fracture.

2. Aortoiliac and visceral atherosclerotic vascular calcification.
Bilateral iliac stents noted.

3.  Large amount of stool noted throughout the colon.

## 2022-12-31 DIAGNOSIS — I739 Peripheral vascular disease, unspecified: Secondary | ICD-10-CM | POA: Diagnosis not present

## 2023-01-08 ENCOUNTER — Ambulatory Visit
Admission: RE | Admit: 2023-01-08 | Discharge: 2023-01-08 | Disposition: A | Discharge status: Home / Self Care | Payer: Medicare HMO | Source: Ambulatory Visit | Attending: Acute Care | Admitting: Acute Care

## 2023-01-08 DIAGNOSIS — Z87891 Personal history of nicotine dependence: Secondary | ICD-10-CM

## 2023-01-08 DIAGNOSIS — F17210 Nicotine dependence, cigarettes, uncomplicated: Secondary | ICD-10-CM | POA: Diagnosis not present

## 2023-01-08 DIAGNOSIS — Z122 Encounter for screening for malignant neoplasm of respiratory organs: Secondary | ICD-10-CM | POA: Diagnosis not present

## 2023-01-08 DIAGNOSIS — J439 Emphysema, unspecified: Secondary | ICD-10-CM | POA: Insufficient documentation

## 2023-01-08 DIAGNOSIS — I700 Atherosclerosis of aorta: Secondary | ICD-10-CM | POA: Diagnosis not present

## 2023-01-09 DIAGNOSIS — J449 Chronic obstructive pulmonary disease, unspecified: Secondary | ICD-10-CM | POA: Diagnosis not present

## 2023-01-14 ENCOUNTER — Other Ambulatory Visit: Payer: Self-pay

## 2023-01-14 DIAGNOSIS — Z122 Encounter for screening for malignant neoplasm of respiratory organs: Secondary | ICD-10-CM

## 2023-01-14 DIAGNOSIS — Z87891 Personal history of nicotine dependence: Secondary | ICD-10-CM

## 2023-01-14 DIAGNOSIS — F17210 Nicotine dependence, cigarettes, uncomplicated: Secondary | ICD-10-CM

## 2023-01-18 ENCOUNTER — Ambulatory Visit: Payer: PPO | Admitting: Internal Medicine

## 2023-01-30 ENCOUNTER — Encounter: Payer: Self-pay | Admitting: Internal Medicine

## 2023-01-30 ENCOUNTER — Ambulatory Visit (INDEPENDENT_AMBULATORY_CARE_PROVIDER_SITE_OTHER): Payer: Medicare HMO | Admitting: Internal Medicine

## 2023-01-30 VITALS — BP 118/70 | HR 73 | Ht 73.0 in | Wt 184.0 lb

## 2023-01-30 DIAGNOSIS — R3914 Feeling of incomplete bladder emptying: Secondary | ICD-10-CM

## 2023-01-30 DIAGNOSIS — N401 Enlarged prostate with lower urinary tract symptoms: Secondary | ICD-10-CM | POA: Diagnosis not present

## 2023-01-30 DIAGNOSIS — L409 Psoriasis, unspecified: Secondary | ICD-10-CM | POA: Diagnosis not present

## 2023-01-30 LAB — POCT URINALYSIS DIPSTICK
Bilirubin, UA: NEGATIVE
Glucose, UA: NEGATIVE
Ketones, UA: NEGATIVE
Leukocytes, UA: NEGATIVE
Nitrite, UA: NEGATIVE
Protein, UA: NEGATIVE
Spec Grav, UA: 1.015 (ref 1.010–1.025)
Urobilinogen, UA: 0.2 E.U./dL
pH, UA: 5 (ref 5.0–8.0)

## 2023-01-30 MED ORDER — SULFAMETHOXAZOLE-TRIMETHOPRIM 800-160 MG PO TABS: 1.0000 | ORAL_TABLET | Freq: Two times a day (BID) | ORAL | 0 refills | Status: AC

## 2023-01-30 MED ORDER — BETAMETHASONE VALERATE 0.1 % EX CREA: TOPICAL_CREAM | Freq: Two times a day (BID) | CUTANEOUS | 1 refills | Status: DC

## 2023-01-30 MED ORDER — TAMSULOSIN HCL 0.4 MG PO CAPS: 0.4000 mg | ORAL_CAPSULE | Freq: Every day | ORAL | 0 refills | Status: DC

## 2023-01-30 NOTE — Progress Notes (Unsigned)
Date:  01/30/2023   Name:  Aaron [name]   DOB:  10-27-1955   MRN:  [id number]   Chief Complaint: Urinary Retention (Started 2 weeks ago. No pain.)  Urinary Frequency  This is a new problem. The current episode started 1 to 4 weeks ago. The problem occurs every urination. The problem has been unchanged. The patient is experiencing no pain. Associated symptoms include frequency and hesitancy. Pertinent negatives include no chills or hematuria. Associated symptoms comments: And sensation of incomplete emptying . He has tried nothing for the symptoms.  Rash This is a recurrent problem. Location: buttocks. The rash is characterized by itchiness, scaling and redness. Pertinent negatives include no fatigue or fever. Past treatments include topical steroids. The treatment provided significant relief.    Lab Results  Component Value Date   NA 130 (L) 09/17/2022   K 4.0 09/17/2022   CO2 23 09/17/2022   GLUCOSE 142 (H) 09/17/2022   BUN 19 09/17/2022   CREATININE 0.87 09/17/2022   CALCIUM 9.2 09/17/2022   EGFR 91 04/06/2022   GFRNONAA >60 09/17/2022   Lab Results  Component Value Date   CHOL 144 04/06/2022   HDL 39 (L) 04/06/2022   LDLCALC 78 04/06/2022   TRIG 155 (H) 04/06/2022   CHOLHDL 3.7 04/06/2022   Lab Results  Component Value Date   TSH 3.214 02/06/2019   Lab Results  Component Value Date   HGBA1C 6.0 (H) 04/06/2022   Lab Results  Component Value Date   WBC 7.4 09/17/2022   HGB 15.7 09/17/2022   HCT 46.9 09/17/2022   MCV 92.7 09/17/2022   PLT 225 09/17/2022   Lab Results  Component Value Date   ALT 28 04/06/2022   AST 22 04/06/2022   ALKPHOS 84 04/06/2022   BILITOT 0.4 04/06/2022   No results found for: "25OHVITD2", "25OHVITD3", "VD25OH"   Review of Systems  Constitutional:  Negative for chills, fatigue and fever.  Cardiovascular:  Negative for chest pain.  Gastrointestinal:  Negative for abdominal pain.  Genitourinary:  Positive for difficulty  urinating, frequency and hesitancy. Negative for hematuria and testicular pain.  Skin:  Positive for rash.  Psychiatric/Behavioral:  Negative for dysphoric mood and sleep disturbance. The patient is not nervous/anxious.     Patient Active Problem List   Diagnosis Date Noted   Atherosclerosis of native arteries of extremities with rest pain, left leg (HCC) 10/08/2022   Atherosclerosis of artery of extremity with intermittent claudication (HCC) 04/11/2022   Hepatic steatosis 01/04/2022   Aortic atherosclerosis (HCC) 07/10/2020   Stage 2 moderate COPD by GOLD classification (HCC) 05/16/2020   Dyspepsia and disorder of function of stomach 05/16/2020   Cervical disc disorder of mid-cervical region 05/10/2020   Polyp of descending colon    Mixed hyperlipidemia 10/28/2018   Atherosclerosis of native arteries of extremity with intermittent claudication (HCC) 07/13/2016   Tobacco use disorder 07/13/2016   Prediabetes 07/27/2015   Controlled gout 12/22/2014   Coronary artery disease involving native coronary artery of native heart with angina pectoris (HCC) 10/09/2014   Essential (primary) hypertension 10/09/2014   Palmar fascial fibromatosis (dupuytren) 10/09/2014   Psoriasis 10/09/2014   H/O cardiac catheterization 01/27/2014   Peripheral vascular disease (HCC) 01/27/2014    Allergies  Allergen Reactions   Contrast Media [Iodinated Contrast Media] Hives   Iodine Hives    Past Surgical History:  Procedure Laterality Date   APPENDECTOMY     CATARACT EXTRACTION W/PHACO Right 10/26/2021   Procedure: CATARACT EXTRACTION  PHACO AND INTRAOCULAR LENS PLACEMENT (IOC) RIGHT OMIDRIA  10.98 01:18.2;  Surgeon: Estanislado Pandy, MD;  Location: [facility];  Service: Ophthalmology;  Laterality: Right;   CATARACT EXTRACTION W/PHACO Left 11/16/2021   Procedure: CATARACT EXTRACTION PHACO AND INTRAOCULAR LENS PLACEMENT (IOC) LEFT;  Surgeon: Estanislado Pandy, MD;  Location: [facility];  Service: Ophthalmology;  Laterality: Left;  6.91 0:46.0   COLONOSCOPY  03/30/2013   [place]   COLONOSCOPY WITH PROPOFOL N/A 12/10/2019   Procedure: COLONOSCOPY WITH PROPOFOL;  Surgeon: Midge Minium, MD;  Location: [facility];  Service: Endoscopy;  Laterality: N/A;   COLONOSCOPY WITH PROPOFOL N/A 01/18/2020   Procedure: COLONOSCOPY WITH PROPOFOL;  Surgeon: Midge Minium, MD;  Location: [facility];  Service: Endoscopy;  Laterality: N/A;  4   CORONARY ANGIOPLASTY WITH STENT PLACEMENT Left    Procedure: CORONARY ANGIOPLASTY WITH STENT PLACEMENT   ENDARTERECTOMY FEMORAL Left 04/11/2022   Procedure: ENDARTERECTOMY FEMORAL ( SFA & TIBIAL); insertion of SFA stent;  Surgeon: Annice Needy, MD;  Location: [facility] OR[facility];  Service: Vascular;  Laterality: Left;   LEFT HEART CATH AND CORONARY ANGIOGRAPHY N/A 12/29/2020   Procedure: LEFT HEART CATH AND CORONARY ANGIOGRAPHY;  Surgeon: Lamar Blinks, MD;  Location: [facility] IN[facility];  Service: Cardiovascular;  Laterality: N/A;   LEFT HEART CATH AND CORONARY ANGIOGRAPHY Left 06/19/2010   Procedure: LEFT HEART CATH AND CORONARY ANGIOGRAPHY; Location: [facility]; Surgeon: Despina Hick, MD   LEFT HEART CATH AND CORONARY ANGIOGRAPHY Left 07/08/2014   Procedure: LEFT HEART CATH AND CORONARY ANGIOGRAPHY; Location: [facility]; Surgeon: Marcina Millard, MD   LOWER EXTREMITY ANGIOGRAM Left 04/11/2022   Procedure: LOWER EXTREMITY ANGIOGRAM;  Surgeon: Annice Needy, MD;  Location: [facility] OR[facility];  Service: Vascular;  Laterality: Left;   LOWER EXTREMITY ANGIOGRAPHY Right 09/25/2018   Procedure: LOWER EXTREMITY ANGIOGRAPHY;  Surgeon: Annice Needy, MD;  Location: [facility] LAB;  Service: Cardiovascular;  Laterality: Right;   LOWER EXTREMITY ANGIOGRAPHY Left 03/17/2020   Procedure: LOWER EXTREMITY ANGIOGRAPHY;  Surgeon: Annice Needy, MD;  Location: [facility];  Service: Cardiovascular;  Laterality: Left;   LOWER EXTREMITY ANGIOGRAPHY Right  02/22/2022   Procedure: Lower Extremity Angiography;  Surgeon: Annice Needy, MD;  Location: [facility];  Service: Cardiovascular;  Laterality: Right;   POLYPECTOMY N/A 01/18/2020   Procedure: POLYPECTOMY;  Surgeon: Midge Minium, MD;  Location: [facility];  Service: Endoscopy;  Laterality: N/A;    Social History   Tobacco Use   Smoking status: Every Day    Types: Cigars   Smokeless tobacco: Never  Vaping Use   Vaping status: Never Used  Substance Use Topics   Alcohol use: Yes    Comment: quit drinking around 04/2019(May have drink 1x/mo)   Drug use: No     Medication list has been reviewed and updated.  Current Meds  Medication Sig   albuterol (VENTOLIN HFA) 108 (90 Base) MCG/ACT inhaler Inhale 2 puffs into the lungs every 6 (six) hours as needed for wheezing or shortness of breath.   aspirin 325 MG tablet Take 325 mg by mouth daily.   cilostazol (PLETAL) 50 MG tablet Take by mouth 2 (two) times daily.    clopidogrel (PLAVIX) 75 MG tablet Take 1 tablet by mouth daily.   indomethacin (INDOCIN) 25 MG capsule Take 1 capsule (25 mg total) by mouth 2 (two) times daily as needed.   isosorbide mononitrate (IMDUR) 120 MG 24 hr tablet Take 1  tablet (120 mg total) by mouth daily.   lisinopril (PRINIVIL,ZESTRIL) 10 MG tablet Take 1 tablet by mouth daily.   metoprolol tartrate (LOPRESSOR) 50 MG tablet Take 50-75 mg by mouth 2 (two) times a day. Take 50MG  by mouth every morning and 75MG  every evening   nitroGLYCERIN (NITROSTAT) 0.4 MG SL tablet Place 1 tablet under the tongue as needed.   psyllium (METAMUCIL) 58.6 % packet Take 1 packet by mouth daily.   simvastatin (ZOCOR) 40 MG tablet Take 1 tablet (40 mg total) by mouth at bedtime.   sulfamethoxazole-trimethoprim (BACTRIM DS) 800-160 MG tablet Take 1 tablet by mouth 2 (two) times daily for 10 days.   tamsulosin (FLOMAX) 0.4 MG CAPS capsule Take 1 capsule (0.4 mg total) by mouth at bedtime.   [DISCONTINUED] betamethasone  valerate (VALISONE) 0.1 % cream Apply topically 2 (two) times daily. To rash on buttocks       01/30/2023    3:40 PM 10/08/2022    1:26 PM 08/13/2022    2:47 PM 08/01/2022    2:06 PM  GAD 7 : Generalized Anxiety Score  Nervous, Anxious, on Edge 0 0 0 0  Control/stop worrying 0 0 0 0  Worry too much - different things 0 0 0 0  Trouble relaxing 0 0 0 0  Restless 0 0 0 0  Easily annoyed or irritable 0 0 0 0  Afraid - awful might happen 0 0 0 0  Total GAD 7 Score 0 0 0 0  Anxiety Difficulty Not difficult at all Not difficult at all Not difficult at all Not difficult at all       01/30/2023    3:40 PM 10/08/2022    1:25 PM 08/13/2022    2:47 PM  Depression screen PHQ 2/9  Decreased Interest 0 0 0  Down, Depressed, Hopeless 0 0 0  PHQ - 2 Score 0 0 0  Altered sleeping 0 0 0  Tired, decreased energy 0 0 0  Change in appetite 0 0 0  Feeling bad or failure about yourself  0 0 0  Trouble concentrating 0 0 0  Moving slowly or fidgety/restless 0 0 0  Suicidal thoughts 0 0 0  PHQ-9 Score 0 0 0  Difficult doing work/chores Not difficult at all Not difficult at all Not difficult at all    BP Readings from Last 3 Encounters:  01/30/23 118/70  11/01/22 120/70  10/08/22 118/68    Physical Exam Vitals and nursing note reviewed.  Constitutional:      General: He is not in acute distress.    Appearance: Normal appearance. He is well-developed.  HENT:     Head: Normocephalic and atraumatic.  Pulmonary:     Effort: Pulmonary effort is normal. No respiratory distress.  Genitourinary:    Prostate: Enlarged and tender.     Rectum: Normal.  Skin:    General: Skin is warm and dry.     Findings: Rash present.     Comments: Psoriatic lesions on buttock.  Neurological:     Mental Status: He is alert and oriented to person, place, and time.  Psychiatric:        Mood and Affect: Mood normal.        Behavior: Behavior normal.     Wt Readings from Last 3 Encounters:  01/30/23 184 lb (83.5  kg)  11/01/22 189 lb 6.4 oz (85.9 kg)  10/08/22 185 lb (83.9 kg)    BP 118/70   Pulse 73  Ht 6\' 1"  (1.854 m)   Wt 184 lb (83.5 kg)   SpO2 97%   BMI 24.28 kg/m   Assessment and Plan:  Problem List Items Addressed This Visit     Psoriasis - Primary (Chronic)   Relevant Medications   betamethasone valerate (VALISONE) 0.1 % cream   Other Visit Diagnoses     Benign prostatic hyperplasia with incomplete bladder emptying       suspect some component of prostatitis with blood in UA will treat with Bactrim and begin Flomax   Relevant Medications   tamsulosin (FLOMAX) 0.4 MG CAPS capsule   sulfamethoxazole-trimethoprim (BACTRIM DS) 800-160 MG tablet   Other Relevant Orders   POCT urinalysis dipstick       No follow-ups on file.    Reubin Milan, MD [facility] Primary [facility] and Sports Medicine [place]

## 2023-02-04 ENCOUNTER — Other Ambulatory Visit (INDEPENDENT_AMBULATORY_CARE_PROVIDER_SITE_OTHER): Payer: Self-pay | Admitting: Nurse Practitioner

## 2023-02-04 DIAGNOSIS — I739 Peripheral vascular disease, unspecified: Secondary | ICD-10-CM

## 2023-02-05 ENCOUNTER — Ambulatory Visit (INDEPENDENT_AMBULATORY_CARE_PROVIDER_SITE_OTHER): Payer: Medicare HMO | Admitting: Vascular Surgery

## 2023-02-05 ENCOUNTER — Ambulatory Visit (INDEPENDENT_AMBULATORY_CARE_PROVIDER_SITE_OTHER): Payer: Medicare HMO

## 2023-02-05 ENCOUNTER — Encounter (INDEPENDENT_AMBULATORY_CARE_PROVIDER_SITE_OTHER): Payer: Self-pay | Admitting: Vascular Surgery

## 2023-02-05 VITALS — BP 87/57 | HR 60 | Resp 16 | Wt 185.0 lb

## 2023-02-05 DIAGNOSIS — Z9889 Other specified postprocedural states: Secondary | ICD-10-CM

## 2023-02-05 DIAGNOSIS — I739 Peripheral vascular disease, unspecified: Secondary | ICD-10-CM | POA: Diagnosis not present

## 2023-02-05 DIAGNOSIS — E782 Mixed hyperlipidemia: Secondary | ICD-10-CM | POA: Diagnosis not present

## 2023-02-05 DIAGNOSIS — I70219 Atherosclerosis of native arteries of extremities with intermittent claudication, unspecified extremity: Secondary | ICD-10-CM

## 2023-02-05 DIAGNOSIS — I10 Essential (primary) hypertension: Secondary | ICD-10-CM

## 2023-02-05 NOTE — Assessment & Plan Note (Signed)
At current, his ABIs are 1.15 on the right and 1.07 on the left without any obvious focal stenosis by duplex.  Continue current medical regimen.  This includes dual antiplatelet therapy.  Follow-up in 6 months with noninvasive studies.

## 2023-02-05 NOTE — Progress Notes (Signed)
MRN : [id number]  Aaron [name] is a 67 y.o. (03/08/1956) male who presents with chief complaint of  Chief Complaint  Patient presents with   Follow-up    Ultrasound follow up  .  History of Present Illness: Patient returns today in follow up of his PAD.  He is doing well.  He denies any ischemic rest pain, lifestyle limiting claudication, or ulceration.  He has undergone extensive lower extremity revascularization bilaterally.  His most recent procedure was almost a year ago with a left femoral endarterectomy and left SFA/popliteal stents.  At current, his ABIs are 1.15 on the right and 1.07 on the left without any obvious focal stenosis by duplex.  Current Outpatient Medications  Medication Sig Dispense Refill   albuterol (VENTOLIN HFA) 108 (90 Base) MCG/ACT inhaler Inhale 2 puffs into the lungs every 6 (six) hours as needed for wheezing or shortness of breath. 6.7 g 5   aspirin 325 MG tablet Take 325 mg by mouth daily.     betamethasone valerate (VALISONE) 0.1 % cream Apply topically 2 (two) times daily. To rash on buttocks 45 g 1   cilostazol (PLETAL) 50 MG tablet Take by mouth 2 (two) times daily.      clopidogrel (PLAVIX) 75 MG tablet Take 1 tablet by mouth daily.     indomethacin (INDOCIN) 25 MG capsule Take 1 capsule (25 mg total) by mouth 2 (two) times daily as needed. 30 capsule 1   isosorbide mononitrate (IMDUR) 120 MG 24 hr tablet Take 1 tablet (120 mg total) by mouth daily. 30 tablet 0   lisinopril (PRINIVIL,ZESTRIL) 10 MG tablet Take 1 tablet by mouth daily.     metoprolol tartrate (LOPRESSOR) 50 MG tablet Take 50-75 mg by mouth 2 (two) times a day. Take 50MG  by mouth every morning and 75MG  every evening     nitroGLYCERIN (NITROSTAT) 0.4 MG SL tablet Place 1 tablet under the tongue as needed.     psyllium (METAMUCIL) 58.6 % packet Take 1 packet by mouth daily.     simvastatin (ZOCOR) 40 MG tablet Take 1 tablet (40 mg total) by mouth at bedtime. 30 tablet 5    sulfamethoxazole-trimethoprim (BACTRIM DS) 800-160 MG tablet Take 1 tablet by mouth 2 (two) times daily for 10 days. 20 tablet 0   tamsulosin (FLOMAX) 0.4 MG CAPS capsule Take 1 capsule (0.4 mg total) by mouth at bedtime. 90 capsule 0   No current facility-administered medications for this visit.    Past Medical History:  Diagnosis Date   Aortic atherosclerosis (HCC)    COPD (chronic obstructive pulmonary disease) (HCC)    Coronary artery disease 06/19/2010   a.) LHC/PCI (unknown date) --> stent to pRCA and pLAD (unknown type); b.) LHC 06/19/2010: 30 mLM, 60 mLAD, 60 pLCx, 50 pRI, 40 ISR pRCA, 50 dRCA, 50 RPDA, 50 RPLS --> med mgmt; c.) LHC 07/08/2014: 10 pLM, 30 dLAD, 75 oLCx, 30 mRCA, 40 RPDA, patent RCA stent --> med mgmt; d.) LHC 12/28/2021: 50 mRCA, 65 RPDA, 45 m-dRCA, 60 o-pRCA, 95 o-pLCx, 60 RI, 30 pLAD, 30 mLM --> med mgmt.   Diastolic dysfunction 09/16/2017   a.) TTE 09/16/2017: ED 55-60%, triv AR/MR, G1DD   DVT (deep venous thrombosis) (HCC) 04/10/2022   left groin   Fracture of left ankle, closed, initial encounter 10/23/2017   GERD (gastroesophageal reflux disease)    Glaucoma    Gout    Hepatic steatosis    Hypercholesteremia    Hypertension  Long term current use of antithrombotics/antiplatelets    a.) cilostazol + full dose ASA + clopidogrel   PAD (peripheral artery disease) (HCC)    a.) s/p multiple PTA procedures to lower extremities   Pain in limb 07/13/2016   Syncope 09/16/2017   Unstable angina (HCC) 01/12/2017   Vertigo     Past Surgical History:  Procedure Laterality Date   APPENDECTOMY     CATARACT EXTRACTION W/PHACO Right 10/26/2021   Procedure: CATARACT EXTRACTION PHACO AND INTRAOCULAR LENS PLACEMENT (IOC) RIGHT OMIDRIA  10.98 01:18.2;  Surgeon: Estanislado Pandy, MD;  Location: [facility];  Service: Ophthalmology;  Laterality: Right;   CATARACT EXTRACTION W/PHACO Left 11/16/2021   Procedure: CATARACT EXTRACTION PHACO AND INTRAOCULAR  LENS PLACEMENT (IOC) LEFT;  Surgeon: Estanislado Pandy, MD;  Location: [facility];  Service: Ophthalmology;  Laterality: Left;  6.91 0:46.0   COLONOSCOPY  03/30/2013   [place]   COLONOSCOPY WITH PROPOFOL N/A 12/10/2019   Procedure: COLONOSCOPY WITH PROPOFOL;  Surgeon: Midge Minium, MD;  Location: [facility];  Service: Endoscopy;  Laterality: N/A;   COLONOSCOPY WITH PROPOFOL N/A 01/18/2020   Procedure: COLONOSCOPY WITH PROPOFOL;  Surgeon: Midge Minium, MD;  Location: [facility];  Service: Endoscopy;  Laterality: N/A;  4   CORONARY ANGIOPLASTY WITH STENT PLACEMENT Left    Procedure: CORONARY ANGIOPLASTY WITH STENT PLACEMENT   ENDARTERECTOMY FEMORAL Left 04/11/2022   Procedure: ENDARTERECTOMY FEMORAL ( SFA & TIBIAL); insertion of SFA stent;  Surgeon: Annice Needy, MD;  Location: [facility];  Service: Vascular;  Laterality: Left;   LEFT HEART CATH AND CORONARY ANGIOGRAPHY N/A 12/29/2020   Procedure: LEFT HEART CATH AND CORONARY ANGIOGRAPHY;  Surgeon: Lamar Blinks, MD;  Location: [facility];  Service: Cardiovascular;  Laterality: N/A;   LEFT HEART CATH AND CORONARY ANGIOGRAPHY Left 06/19/2010   Procedure: LEFT HEART CATH AND CORONARY ANGIOGRAPHY; Location: [facility]; Surgeon: Despina Hick, MD   LEFT HEART CATH AND CORONARY ANGIOGRAPHY Left 07/08/2014   Procedure: LEFT HEART CATH AND CORONARY ANGIOGRAPHY; Location: [facility]; Surgeon: Marcina Millard, MD   LOWER EXTREMITY ANGIOGRAM Left 04/11/2022   Procedure: LOWER EXTREMITY ANGIOGRAM;  Surgeon: Annice Needy, MD;  Location: [facility];  Service: Vascular;  Laterality: Left;   LOWER EXTREMITY ANGIOGRAPHY Right 09/25/2018   Procedure: LOWER EXTREMITY ANGIOGRAPHY;  Surgeon: Annice Needy, MD;  Location: [facility];  Service: Cardiovascular;  Laterality: Right;   LOWER EXTREMITY ANGIOGRAPHY Left 03/17/2020   Procedure: LOWER EXTREMITY ANGIOGRAPHY;  Surgeon: Annice Needy, MD;  Location: [facility];   Service: Cardiovascular;  Laterality: Left;   LOWER EXTREMITY ANGIOGRAPHY Right 02/22/2022   Procedure: Lower Extremity Angiography;  Surgeon: Annice Needy, MD;  Location: [facility];  Service: Cardiovascular;  Laterality: Right;   POLYPECTOMY N/A 01/18/2020   Procedure: POLYPECTOMY;  Surgeon: Midge Minium, MD;  Location: [facility];  Service: Endoscopy;  Laterality: N/A;     Social History   Tobacco Use   Smoking status: Every Day    Types: Cigars   Smokeless tobacco: Never  Vaping Use   Vaping status: Never Used  Substance Use Topics   Alcohol use: Yes    Comment: quit drinking around 04/2019(May have drink 1x/mo)   Drug use: No      Family History  Problem Relation Age of Onset   Hypertension Mother    Diabetes Mother    Heart failure Father  age 11   Cancer Father    Ovarian cancer Sister    Heart disease Brother      Allergies  Allergen Reactions   Contrast Media [Iodinated Contrast Media] Hives   Iodine Hives     REVIEW OF SYSTEMS (Negative unless checked)   Constitutional: [] Weight loss  [] Fever  [] Chills Cardiac: [] Chest pain   [] Chest pressure   [] Palpitations   [] Shortness of breath when laying flat   [] Shortness of breath at rest   [] Shortness of breath with exertion. Vascular:  [x] Pain in legs with walking   [] Pain in legs at rest   [] Pain in legs when laying flat   [x] Claudication   [] Pain in feet when walking  [] Pain in feet at rest  [] Pain in feet when laying flat   [] History of DVT   [] Phlebitis   [x] Swelling in legs   [] Varicose veins   [] Non-healing ulcers Pulmonary:   [] Uses home oxygen   [] Productive cough   [] Hemoptysis   [] Wheeze  [] COPD   [] Asthma Neurologic:  [] Dizziness  [] Blackouts   [] Seizures   [] History of stroke   [] History of TIA  [] Aphasia   [] Temporary blindness   [] Dysphagia   [] Weakness or numbness in arms   [] Weakness or numbness in legs Musculoskeletal:  [x] Arthritis   [] Joint swelling   [x] Joint pain    [] Low back pain Hematologic:  [] Easy bruising  [] Easy bleeding   [] Hypercoagulable state   [] Anemic   Gastrointestinal:  [] Blood in stool   [] Vomiting blood  [] Gastroesophageal reflux/heartburn   [] Abdominal pain Genitourinary:  [] Chronic kidney disease   [] Difficult urination  [] Frequent urination  [] Burning with urination   [] Hematuria Skin:  [] Rashes   [] Ulcers   [] Wounds Psychological:  [] History of anxiety   []  History of major depression.  Physical Examination  BP (!) 87/57 (BP Location: Left Arm)   Pulse 60   Resp 16   Wt 185 lb (83.9 kg)   BMI 24.41 kg/m  Gen:  WD/WN, NAD Head: [place]/AT, No temporalis wasting. Ear/Nose/Throat: Hearing grossly intact, nares w/o erythema or drainage Eyes: Conjunctiva clear. Sclera non-icteric Neck: Supple.  Trachea midline Pulmonary:  Good air movement, no use of accessory muscles.  Cardiac: RRR, no JVD Vascular:  Vessel Right Left  Radial Palpable Palpable                          PT Palpable Palpable  DP Palpable Palpable   Gastrointestinal: soft, non-tender/non-distended. No guarding/reflex.  Musculoskeletal: M/S 5/5 throughout.  No deformity or atrophy. No significant LE edema. Neurologic: Sensation grossly intact in extremities.  Symmetrical.  Speech is fluent.  Psychiatric: Judgment intact, Mood & affect appropriate for pt's clinical situation. Dermatologic: No rashes or ulcers noted.  No cellulitis or open wounds.      Labs Recent Results (from the past 2160 hour(s))  POCT urinalysis dipstick     Status: Normal   Collection Time: 01/30/23  3:59 PM  Result Value Ref Range   Color, UA yellow    Clarity, UA clear    Glucose, UA Negative Negative   Bilirubin, UA neg    Ketones, UA neg    Spec Grav, UA 1.015 1.010 - 1.025   Blood, UA large 3+    pH, UA 5.0 5.0 - 8.0   Protein, UA Negative Negative   Urobilinogen, UA 0.2 0.2 or 1.0 E.U./dL   Nitrite, UA neg    Leukocytes, UA Negative Negative   Appearance  clear     Odor none     Radiology CT CHEST LUNG CA SCREEN LOW DOSE W/O CM  Result Date: 01/11/2023 CLINICAL DATA:  67 year old male with 48 pack-year history of smoking. Lung cancer screening. EXAM: CT CHEST WITHOUT CONTRAST LOW-DOSE FOR LUNG CANCER SCREENING TECHNIQUE: Multidetector CT imaging of the chest was performed following the standard protocol without IV contrast. RADIATION DOSE REDUCTION: This exam was performed according to the departmental dose-optimization program which includes automated exposure control, adjustment of the mA and/or kV according to patient size and/or use of iterative reconstruction technique. COMPARISON:  01/02/2022 FINDINGS: Cardiovascular: The heart size is normal. No substantial pericardial effusion. Coronary artery calcification is evident. Mild atherosclerotic calcification is noted in the wall of the thoracic aorta. Mediastinum/Nodes: No mediastinal lymphadenopathy. No evidence for gross hilar lymphadenopathy although assessment is limited by the lack of intravenous contrast on the current study. The esophagus has normal imaging features. There is no axillary lymphadenopathy. Lungs/Pleura: Centrilobular and paraseptal emphysema evident. Scattered tiny bilateral pulmonary nodules identified previously are stable in the interval. No new suspicious pulmonary nodule or mass. No focal airspace consolidation. No pleural effusion. Upper Abdomen: Unremarkable. Musculoskeletal: No worrisome lytic or sclerotic osseous abnormality. Stable superior endplate compression deformity at T12. IMPRESSION: Lung-RADS 2, benign appearance or behavior. Continue annual screening with low-dose chest CT without contrast in 12 months. Aortic Atherosclerosis (ICD10-I70.0) and Emphysema (ICD10-J43.9). Electronically Signed   By: Kennith Center M.D.   On: 01/11/2023 16:22    Assessment/Plan Essential (primary) hypertension blood pressure control important in reducing the progression of atherosclerotic disease.  On appropriate oral medications.     Mixed hyperlipidemia lipid control important in reducing the progression of atherosclerotic disease. Continue statin therapy  Atherosclerosis of artery of extremity with intermittent claudication (HCC) At current, his ABIs are 1.15 on the right and 1.07 on the left without any obvious focal stenosis by duplex.  Continue current medical regimen.  This includes dual antiplatelet therapy.  Follow-up in 6 months with noninvasive studies.    Festus Barren, MD  02/05/2023 11:31 AM    This note was created with Dragon medical transcription system.  Any errors from dictation are purely unintentional

## 2023-02-22 ENCOUNTER — Telehealth: Payer: Self-pay | Admitting: Internal Medicine

## 2023-02-22 ENCOUNTER — Other Ambulatory Visit: Payer: Self-pay

## 2023-02-22 DIAGNOSIS — N401 Enlarged prostate with lower urinary tract symptoms: Secondary | ICD-10-CM

## 2023-02-22 NOTE — Telephone Encounter (Signed)
Referral placed.  KP 

## 2023-02-22 NOTE — Telephone Encounter (Signed)
Referral Request - Has patient seen PCP for this complaint? Yes.    *If NO, is insurance requiring patient see PCP for this issue before PCP can refer them?   Referral for which specialty: UROLOGY  Preferred provider/office: if there is one in [place] is fine patient stated  Reason for referral: PCP gave medication for prostate issue and medication is not helping and PCP said if it doesn't help to contact back for a referral   Patients callback # [phone]

## 2023-02-22 NOTE — Telephone Encounter (Signed)
Can referral be placed?  KP

## 2023-03-06 ENCOUNTER — Other Ambulatory Visit: Payer: Self-pay

## 2023-03-06 ENCOUNTER — Other Ambulatory Visit
Admission: RE | Admit: 2023-03-06 | Discharge: 2023-03-06 | Disposition: A | Discharge status: Home / Self Care | Payer: Medicare HMO | Attending: Urology | Admitting: Urology

## 2023-03-06 ENCOUNTER — Encounter: Payer: Self-pay | Admitting: Urology

## 2023-03-06 ENCOUNTER — Ambulatory Visit: Payer: Medicare HMO | Admitting: Urology

## 2023-03-06 VITALS — BP 131/72 | HR 76 | Ht 73.0 in | Wt 185.4 lb

## 2023-03-06 DIAGNOSIS — R3914 Feeling of incomplete bladder emptying: Secondary | ICD-10-CM

## 2023-03-06 DIAGNOSIS — N401 Enlarged prostate with lower urinary tract symptoms: Secondary | ICD-10-CM | POA: Insufficient documentation

## 2023-03-06 DIAGNOSIS — Z125 Encounter for screening for malignant neoplasm of prostate: Secondary | ICD-10-CM

## 2023-03-06 LAB — URINALYSIS, COMPLETE (UACMP) WITH MICROSCOPIC
Glucose, UA: NEGATIVE mg/dL
Ketones, ur: NEGATIVE mg/dL
Leukocytes,Ua: NEGATIVE
Nitrite: NEGATIVE
Protein, ur: NEGATIVE mg/dL
Specific Gravity, Urine: 1.02 (ref 1.005–1.030)
Squamous Epithelial / HPF: NONE SEEN /HPF (ref 0–5)
WBC, UA: NONE SEEN WBC/hpf (ref 0–5)
pH: 5.5 (ref 5.0–8.0)

## 2023-03-06 LAB — BLADDER SCAN AMB NON-IMAGING

## 2023-03-06 MED ORDER — TAMSULOSIN HCL 0.4 MG PO CAPS: 0.4000 mg | ORAL_CAPSULE | Freq: Every day | ORAL | 3 refills | Status: DC

## 2023-03-06 NOTE — Progress Notes (Signed)
03/06/23 2:06 PM   Aaron [name] 25-Nov-1955 [id number]  CC: BPH and urinary symptoms, PSA screening  HPI: 67 year old male with COPD, CAD on anticoagulation with Plavix who presents with a few months of worsening urinary symptoms.  His primary urinary complaint was urinary frequency and some pelvic pressure.  He was started on Flomax by PCP which has significantly improved his urinary symptoms.  He really denies any significant urinary complaints today, urinalysis today is completely benign, and PVR is normal at 0ml.  He drinks primarily water during the day.  He had a CT abdomen and pelvis in June 2022 showing a normal 34 g prostate, normal-appearing bladder, no hydronephrosis, renal function is normal. PSA from September 2023 normal at 2.6.  PMH: Past Medical History:  Diagnosis Date   Aortic atherosclerosis (HCC)    COPD (chronic obstructive pulmonary disease) (HCC)    Coronary artery disease 06/19/2010   a.) LHC/PCI (unknown date) --> stent to pRCA and pLAD (unknown type); b.) LHC 06/19/2010: 30 mLM, 60 mLAD, 60 pLCx, 50 pRI, 40 ISR pRCA, 50 dRCA, 50 RPDA, 50 RPLS --> med mgmt; c.) LHC 07/08/2014: 10 pLM, 30 dLAD, 75 oLCx, 30 mRCA, 40 RPDA, patent RCA stent --> med mgmt; d.) LHC 12/28/2021: 50 mRCA, 65 RPDA, 45 m-dRCA, 60 o-pRCA, 95 o-pLCx, 60 RI, 30 pLAD, 30 mLM --> med mgmt.   Diastolic dysfunction 09/16/2017   a.) TTE 09/16/2017: ED 55-60%, triv AR/MR, G1DD   DVT (deep venous thrombosis) (HCC) 04/10/2022   left groin   Fracture of left ankle, closed, initial encounter 10/23/2017   GERD (gastroesophageal reflux disease)    Glaucoma    Gout    Hepatic steatosis    Hypercholesteremia    Hypertension    Long term current use of antithrombotics/antiplatelets    a.) cilostazol + full dose ASA + clopidogrel   PAD (peripheral artery disease) (HCC)    a.) s/p multiple PTA procedures to lower extremities   Pain in limb 07/13/2016   Syncope 09/16/2017   Unstable angina (HCC)  01/12/2017   Vertigo     Surgical History: Past Surgical History:  Procedure Laterality Date   APPENDECTOMY     CATARACT EXTRACTION W/PHACO Right 10/26/2021   Procedure: CATARACT EXTRACTION PHACO AND INTRAOCULAR LENS PLACEMENT (IOC) RIGHT OMIDRIA  10.98 01:18.2;  Surgeon: Estanislado Pandy, MD;  Location: [facility];  Service: Ophthalmology;  Laterality: Right;   CATARACT EXTRACTION W/PHACO Left 11/16/2021   Procedure: CATARACT EXTRACTION PHACO AND INTRAOCULAR LENS PLACEMENT (IOC) LEFT;  Surgeon: Estanislado Pandy, MD;  Location: [facility];  Service: Ophthalmology;  Laterality: Left;  6.91 0:46.0   COLONOSCOPY  03/30/2013   [place]   COLONOSCOPY WITH PROPOFOL N/A 12/10/2019   Procedure: COLONOSCOPY WITH PROPOFOL;  Surgeon: Midge Minium, MD;  Location: [facility];  Service: Endoscopy;  Laterality: N/A;   COLONOSCOPY WITH PROPOFOL N/A 01/18/2020   Procedure: COLONOSCOPY WITH PROPOFOL;  Surgeon: Midge Minium, MD;  Location: [facility];  Service: Endoscopy;  Laterality: N/A;  4   CORONARY ANGIOPLASTY WITH STENT PLACEMENT Left    Procedure: CORONARY ANGIOPLASTY WITH STENT PLACEMENT   ENDARTERECTOMY FEMORAL Left 04/11/2022   Procedure: ENDARTERECTOMY FEMORAL ( SFA & TIBIAL); insertion of SFA stent;  Surgeon: Annice Needy, MD;  Location: [facility];  Service: Vascular;  Laterality: Left;   LEFT HEART CATH AND CORONARY ANGIOGRAPHY N/A 12/29/2020   Procedure: LEFT HEART CATH AND CORONARY ANGIOGRAPHY;  Surgeon: Lamar Blinks, MD;  Location: [facility];  Service: Cardiovascular;  Laterality: N/A;   LEFT HEART CATH AND CORONARY ANGIOGRAPHY Left 06/19/2010   Procedure: LEFT HEART CATH AND CORONARY ANGIOGRAPHY; Location: [facility]; Surgeon: Despina Hick, MD   LEFT HEART CATH AND CORONARY ANGIOGRAPHY Left 07/08/2014   Procedure: LEFT HEART CATH AND CORONARY ANGIOGRAPHY; Location: [facility]; Surgeon: Marcina Millard, MD   LOWER EXTREMITY ANGIOGRAM  Left 04/11/2022   Procedure: LOWER EXTREMITY ANGIOGRAM;  Surgeon: Annice Needy, MD;  Location: [facility];  Service: Vascular;  Laterality: Left;   LOWER EXTREMITY ANGIOGRAPHY Right 09/25/2018   Procedure: LOWER EXTREMITY ANGIOGRAPHY;  Surgeon: Annice Needy, MD;  Location: [facility];  Service: Cardiovascular;  Laterality: Right;   LOWER EXTREMITY ANGIOGRAPHY Left 03/17/2020   Procedure: LOWER EXTREMITY ANGIOGRAPHY;  Surgeon: Annice Needy, MD;  Location: [facility];  Service: Cardiovascular;  Laterality: Left;   LOWER EXTREMITY ANGIOGRAPHY Right 02/22/2022   Procedure: Lower Extremity Angiography;  Surgeon: Annice Needy, MD;  Location: [facility];  Service: Cardiovascular;  Laterality: Right;   POLYPECTOMY N/A 01/18/2020   Procedure: POLYPECTOMY;  Surgeon: Midge Minium, MD;  Location: [facility];  Service: Endoscopy;  Laterality: N/A;    Family History: Family History  Problem Relation Age of Onset   Hypertension Mother    Diabetes Mother    Heart failure Father        age 74   Cancer Father    Ovarian cancer Sister    Heart disease Brother     Social History:  reports that he has been smoking cigars. He has never used smokeless tobacco. He reports current alcohol use. He reports that he does not use drugs.  Physical Exam: BP 131/72 (BP Location: Left Arm, Patient Position: Sitting, Cuff Size: Normal)   Pulse 76   Ht 6\' 1"  (1.854 m)   Wt 185 lb 6.4 oz (84.1 kg)   BMI 24.46 kg/m    Constitutional:  Alert and oriented, No acute distress. Cardiovascular: No clubbing, cyanosis, or edema. Respiratory: Normal respiratory effort, no increased work of breathing. GI: Abdomen is soft, nontender, nondistended, no abdominal masses   Laboratory Data: PSA September 2023 normal at 2.6  Pertinent Imaging: I have personally viewed and interpreted the CT abdomen and pelvis from June 2022 showing a 34 g prostate, no hydronephrosis, normal-appearing  bladder.  Assessment & Plan:   67 year old male with mild BPH symptoms of urinary frequency and pelvic pressure that have resolved with starting Flomax from PCP.  Urinalysis benign, no evidence of microscopic hematuria or infection, PSA normal at 2.6, CT abdomen and pelvis from 2022 benign.  We discussed other options like cystoscopy for further evaluation of prostate anatomy and consideration of outlet procedures, but he satisfied with his symptoms on the Flomax at this time.  Reassurance provided regarding normal CT, urinalysis, PVR, and PSA.  Continue Flomax, refilled RTC 1 year PVR, if doing well at that time likely can be seen as needed and Flomax can be filled by PCP   Legrand Rams, MD 03/06/2023  [facility] [address], [address] [place], [place] [address] [phone]

## 2023-03-29 ENCOUNTER — Ambulatory Visit: Payer: PPO | Admitting: Internal Medicine

## 2023-04-10 ENCOUNTER — Encounter: Payer: Self-pay | Admitting: Internal Medicine

## 2023-04-10 ENCOUNTER — Ambulatory Visit (INDEPENDENT_AMBULATORY_CARE_PROVIDER_SITE_OTHER): Payer: Medicare HMO | Admitting: Internal Medicine

## 2023-04-10 VITALS — BP 116/70 | HR 65 | Ht 73.0 in | Wt 187.0 lb

## 2023-04-10 DIAGNOSIS — Z23 Encounter for immunization: Secondary | ICD-10-CM | POA: Diagnosis not present

## 2023-04-10 DIAGNOSIS — R7303 Prediabetes: Secondary | ICD-10-CM | POA: Diagnosis not present

## 2023-04-10 DIAGNOSIS — N401 Enlarged prostate with lower urinary tract symptoms: Secondary | ICD-10-CM

## 2023-04-10 DIAGNOSIS — E782 Mixed hyperlipidemia: Secondary | ICD-10-CM

## 2023-04-10 DIAGNOSIS — I10 Essential (primary) hypertension: Secondary | ICD-10-CM | POA: Diagnosis not present

## 2023-04-10 DIAGNOSIS — F17200 Nicotine dependence, unspecified, uncomplicated: Secondary | ICD-10-CM | POA: Diagnosis not present

## 2023-04-10 DIAGNOSIS — Z0000 Encounter for general adult medical examination without abnormal findings: Secondary | ICD-10-CM

## 2023-04-10 MED ORDER — RSVPREF3 VAC RECOMB ADJUVANTED 120 MCG/0.5ML IM SUSR: 0.5000 mL | Freq: Once | INTRAMUSCULAR | 0 refills | Status: AC

## 2023-04-10 NOTE — Assessment & Plan Note (Signed)
Symptoms improved with Flomax Seen by urology with reassuring exam

## 2023-04-10 NOTE — Progress Notes (Signed)
Date:  04/10/2023   Name:  Aaron [name]   DOB:  03-09-1956   MRN:  [id number]   Chief Complaint: Annual Exam Aaron [name] is a 67 y.o. male who presents today for his Complete Annual Exam. He feels well. He reports exercising - walking. He reports he is sleeping well.   Colonoscopy: 01/2020 repeat 5 yrs  Immunization History  Administered Date(s) Administered   Fluad Quad(high Dose 65+) 04/05/2021, 03/21/2022   Fluad Trivalent(High Dose 65+) 04/10/2023   Influenza,inj,Quad PF,6+ Mos 05/19/2015, 04/06/2016, 04/02/2017, 03/24/2018, 04/01/2019, 04/14/2020   PFIZER(Purple Top)SARS-COV-2 Vaccination 08/25/2019, 09/15/2019, 06/13/2020   PNEUMOCOCCAL CONJUGATE-20 04/05/2021   Pneumococcal Polysaccharide-23 11/14/2012, 07/03/2014   Tdap 12/18/2013   Health Maintenance Due  Topic Date Due   Zoster Vaccines- Shingrix (1 of 2) Never done   COVID-19 Vaccine (4 - 2023-24 season) 03/10/2023   Medicare Annual Wellness (AWV)  05/31/2023    Lab Results  Component Value Date   PSA1 2.6 04/06/2022   PSA1 1.7 04/05/2021   PSA1 2.5 11/05/2019   PSA 0.98 05/19/2015    Hypertension This is a chronic problem. The problem is controlled. Pertinent negatives include no chest pain, headaches, palpitations or shortness of breath. Past treatments include ACE inhibitors and beta blockers. The current treatment provides significant improvement. Hypertensive end-organ damage includes PVD.  Hyperlipidemia This is a chronic problem. The problem is controlled. Pertinent negatives include no chest pain, myalgias or shortness of breath. Current antihyperlipidemic treatment includes statins. The current treatment provides significant improvement of lipids.  Diabetes He presents for his follow-up diabetic visit. Diabetes type: prediabetes. His disease course has been stable. Pertinent negatives for hypoglycemia include no dizziness, headaches or nervousness/anxiousness. Pertinent negatives for diabetes  include no chest pain and no fatigue. Diabetic complications include PVD. Current diabetic treatment includes diet.  Benign Prostatic Hypertrophy This is a chronic problem. The problem has been gradually improving since onset. Irritative symptoms include frequency. Obstructive symptoms include incomplete emptying. Pertinent negatives include no chills or dysuria.    Lab Results  Component Value Date   NA 130 (L) 09/17/2022   K 4.0 09/17/2022   CO2 23 09/17/2022   GLUCOSE 142 (H) 09/17/2022   BUN 19 09/17/2022   CREATININE 0.87 09/17/2022   CALCIUM 9.2 09/17/2022   EGFR 91 04/06/2022   GFRNONAA >60 09/17/2022   Lab Results  Component Value Date   CHOL 144 04/06/2022   HDL 39 (L) 04/06/2022   LDLCALC 78 04/06/2022   TRIG 155 (H) 04/06/2022   CHOLHDL 3.7 04/06/2022   Lab Results  Component Value Date   TSH 3.214 02/06/2019   Lab Results  Component Value Date   HGBA1C 6.0 (H) 04/06/2022   Lab Results  Component Value Date   WBC 7.4 09/17/2022   HGB 15.7 09/17/2022   HCT 46.9 09/17/2022   MCV 92.7 09/17/2022   PLT 225 09/17/2022   Lab Results  Component Value Date   ALT 28 04/06/2022   AST 22 04/06/2022   ALKPHOS 84 04/06/2022   BILITOT 0.4 04/06/2022   No results found for: "25OHVITD2", "25OHVITD3", "VD25OH"   Review of Systems  Constitutional:  Negative for appetite change, chills, diaphoresis, fatigue and unexpected weight change.  HENT:  Negative for hearing loss, tinnitus, trouble swallowing and voice change.   Eyes:  Negative for visual disturbance.  Respiratory:  Negative for choking, shortness of breath and wheezing.   Cardiovascular:  Negative for chest pain, palpitations and leg swelling.  Gastrointestinal:  Negative for abdominal pain, blood in stool, constipation and diarrhea.  Genitourinary:  Positive for frequency and incomplete emptying. Negative for difficulty urinating and dysuria.  Musculoskeletal:  Negative for arthralgias, back pain and  myalgias.  Skin:  Negative for color change and rash.  Neurological:  Negative for dizziness, syncope and headaches.  Hematological:  Negative for adenopathy.  Psychiatric/Behavioral:  Negative for dysphoric mood and sleep disturbance. The patient is not nervous/anxious.     Patient Active Problem List   Diagnosis Date Noted   BPH with lower urinary tract symptoms without urinary obstruction 04/10/2023   Atherosclerosis of native arteries of extremities with rest pain, left leg (HCC) 10/08/2022   Atherosclerosis of artery of extremity with intermittent claudication (HCC) 04/11/2022   Hepatic steatosis 01/04/2022   Aortic atherosclerosis (HCC) 07/10/2020   Stage 2 moderate COPD by GOLD classification (HCC) 05/16/2020   Dyspepsia and disorder of function of stomach 05/16/2020   Cervical disc disorder of mid-cervical region 05/10/2020   Polyp of descending colon    Mixed hyperlipidemia 10/28/2018   Atherosclerosis of native arteries of extremity with intermittent claudication (HCC) 07/13/2016   Tobacco use disorder 07/13/2016   Prediabetes 07/27/2015   Controlled gout 12/22/2014   Coronary artery disease involving native coronary artery of native heart with angina pectoris (HCC) 10/09/2014   Essential (primary) hypertension 10/09/2014   Palmar fascial fibromatosis (dupuytren) 10/09/2014   Psoriasis 10/09/2014   H/O cardiac catheterization 01/27/2014   Peripheral vascular disease (HCC) 01/27/2014    Allergies  Allergen Reactions   Contrast Media [Iodinated Contrast Media] Hives   Iodine Hives    Past Surgical History:  Procedure Laterality Date   APPENDECTOMY     CATARACT EXTRACTION W/PHACO Right 10/26/2021   Procedure: CATARACT EXTRACTION PHACO AND INTRAOCULAR LENS PLACEMENT (IOC) RIGHT OMIDRIA  10.98 01:18.2;  Surgeon: Estanislado Pandy, MD;  Location: [facility];  Service: Ophthalmology;  Laterality: Right;   CATARACT EXTRACTION W/PHACO Left 11/16/2021    Procedure: CATARACT EXTRACTION PHACO AND INTRAOCULAR LENS PLACEMENT (IOC) LEFT;  Surgeon: Estanislado Pandy, MD;  Location: [facility];  Service: Ophthalmology;  Laterality: Left;  6.91 0:46.0   COLONOSCOPY  03/30/2013   Aaron [name]   COLONOSCOPY WITH PROPOFOL N/A 12/10/2019   Procedure: COLONOSCOPY WITH PROPOFOL;  Surgeon: Midge Minium, MD;  Location: [facility];  Service: Endoscopy;  Laterality: N/A;   COLONOSCOPY WITH PROPOFOL N/A 01/18/2020   Procedure: COLONOSCOPY WITH PROPOFOL;  Surgeon: Midge Minium, MD;  Location: [facility];  Service: Endoscopy;  Laterality: N/A;  4   CORONARY ANGIOPLASTY WITH STENT PLACEMENT Left    Procedure: CORONARY ANGIOPLASTY WITH STENT PLACEMENT   ENDARTERECTOMY FEMORAL Left 04/11/2022   Procedure: ENDARTERECTOMY FEMORAL ( SFA & TIBIAL); insertion of SFA stent;  Surgeon: Annice Needy, MD;  Location: [facility];  Service: Vascular;  Laterality: Left;   LEFT HEART CATH AND CORONARY ANGIOGRAPHY N/A 12/29/2020   Procedure: LEFT HEART CATH AND CORONARY ANGIOGRAPHY;  Surgeon: Lamar Blinks, MD;  Location: [facility];  Service: Cardiovascular;  Laterality: N/A;   LEFT HEART CATH AND CORONARY ANGIOGRAPHY Left 06/19/2010   Procedure: LEFT HEART CATH AND CORONARY ANGIOGRAPHY; Location: [facility]; Surgeon: Despina Hick, MD   LEFT HEART CATH AND CORONARY ANGIOGRAPHY Left 07/08/2014   Procedure: LEFT HEART CATH AND CORONARY ANGIOGRAPHY; Location: [facility]; Surgeon: Marcina Millard, MD   LOWER EXTREMITY ANGIOGRAM Left 04/11/2022   Procedure: LOWER EXTREMITY ANGIOGRAM;  Surgeon: Annice Needy, MD;  Location: [facility];  Service: Vascular;  Laterality: Left;   LOWER EXTREMITY ANGIOGRAPHY Right 09/25/2018   Procedure: LOWER EXTREMITY ANGIOGRAPHY;  Surgeon: Annice Needy, MD;  Location: [facility];  Service: Cardiovascular;  Laterality: Right;   LOWER EXTREMITY ANGIOGRAPHY Left 03/17/2020   Procedure: LOWER EXTREMITY ANGIOGRAPHY;   Surgeon: Annice Needy, MD;  Location: [facility];  Service: Cardiovascular;  Laterality: Left;   LOWER EXTREMITY ANGIOGRAPHY Right 02/22/2022   Procedure: Lower Extremity Angiography;  Surgeon: Annice Needy, MD;  Location: [facility];  Service: Cardiovascular;  Laterality: Right;   POLYPECTOMY N/A 01/18/2020   Procedure: POLYPECTOMY;  Surgeon: Midge Minium, MD;  Location: [facility];  Service: Endoscopy;  Laterality: N/A;    Social History   Tobacco Use   Smoking status: Every Day    Types: Cigars   Smokeless tobacco: Never  Vaping Use   Vaping status: Never Used  Substance Use Topics   Alcohol use: Yes    Comment: quit drinking around 04/2019(May have drink 1x/mo)   Drug use: No     Medication list has been reviewed and updated.  Current Meds  Medication Sig   albuterol (VENTOLIN HFA) 108 (90 Base) MCG/ACT inhaler Inhale 2 puffs into the lungs every 6 (six) hours as needed for wheezing or shortness of breath.   aspirin 325 MG tablet Take 325 mg by mouth daily.   betamethasone valerate (VALISONE) 0.1 % cream Apply topically 2 (two) times daily. To rash on buttocks   BREZTRI AEROSPHERE 160-9-4.8 MCG/ACT AERO    cilostazol (PLETAL) 50 MG tablet Take by mouth 2 (two) times daily.    clopidogrel (PLAVIX) 75 MG tablet Take 1 tablet by mouth daily.   indomethacin (INDOCIN) 25 MG capsule Take 1 capsule (25 mg total) by mouth 2 (two) times daily as needed.   isosorbide mononitrate (IMDUR) 120 MG 24 hr tablet Take 1 tablet (120 mg total) by mouth daily.   lisinopril (PRINIVIL,ZESTRIL) 10 MG tablet Take 1 tablet by mouth daily.   metoprolol tartrate (LOPRESSOR) 50 MG tablet Take 50-75 mg by mouth 2 (two) times a day. Take 50MG  by mouth every morning and 75MG  every evening   nitroGLYCERIN (NITROSTAT) 0.4 MG SL tablet Place 1 tablet under the tongue as needed.   RSV vaccine recomb adjuvanted (AREXVY) 120 MCG/0.5ML injection Inject 0.5 mLs into the muscle once for  1 dose.   simvastatin (ZOCOR) 40 MG tablet Take 1 tablet (40 mg total) by mouth at bedtime.   tamsulosin (FLOMAX) 0.4 MG CAPS capsule Take 1 capsule (0.4 mg total) by mouth at bedtime.   [DISCONTINUED] ipratropium (ATROVENT) 0.06 % nasal spray Place into both nostrils.       04/10/2023    9:13 AM 01/30/2023    3:40 PM 10/08/2022    1:26 PM 08/13/2022    2:47 PM  GAD 7 : Generalized Anxiety Score  Nervous, Anxious, on Edge 0 0 0 0  Control/stop worrying 0 0 0 0  Worry too much - different things 0 0 0 0  Trouble relaxing 0 0 0 0  Restless 0 0 0 0  Easily annoyed or irritable 0 0 0 0  Afraid - awful might happen 0 0 0 0  Total GAD 7 Score 0 0 0 0  Anxiety Difficulty Not difficult at all Not difficult at all Not difficult at all Not difficult at all       04/10/2023    9:13 AM 01/30/2023  3:40 PM 10/08/2022    1:25 PM  Depression screen PHQ 2/9  Decreased Interest 0 0 0  Down, Depressed, Hopeless 0 0 0  PHQ - 2 Score 0 0 0  Altered sleeping 0 0 0  Tired, decreased energy 0 0 0  Change in appetite 0 0 0  Feeling bad or failure about yourself  0 0 0  Trouble concentrating 0 0 0  Moving slowly or fidgety/restless 0 0 0  Suicidal thoughts 0 0 0  PHQ-9 Score 0 0 0  Difficult doing work/chores Not difficult at all Not difficult at all Not difficult at all    BP Readings from Last 3 Encounters:  04/10/23 116/70  03/06/23 131/72  02/05/23 (!) 87/57    Physical Exam Vitals and nursing note reviewed.  Constitutional:      Appearance: Normal appearance. He is well-developed.  HENT:     Head: Normocephalic.     Right Ear: Tympanic membrane, ear canal and external ear normal.     Left Ear: Tympanic membrane, ear canal and external ear normal.     Nose: Nose normal.  Eyes:     Conjunctiva/sclera: Conjunctivae normal.     Pupils: Pupils are equal, round, and reactive to light.  Neck:     Thyroid: No thyromegaly.     Vascular: No carotid bruit.  Cardiovascular:     Rate and  Rhythm: Normal rate and regular rhythm.     Heart sounds: Normal heart sounds.  Pulmonary:     Effort: Pulmonary effort is normal.     Breath sounds: Normal breath sounds. No wheezing.  Chest:  Breasts:    Right: No mass.     Left: No mass.  Abdominal:     General: Bowel sounds are normal.     Palpations: Abdomen is soft.     Tenderness: There is no abdominal tenderness.  Musculoskeletal:        General: Normal range of motion.     Cervical back: Normal range of motion and neck supple.     Right lower leg: No edema.     Left lower leg: No edema.  Lymphadenopathy:     Cervical: No cervical adenopathy.  Skin:    General: Skin is warm and dry.  Neurological:     Mental Status: He is alert and oriented to person, place, and time.     Deep Tendon Reflexes: Reflexes are normal and symmetric.  Psychiatric:        Attention and Perception: Attention normal.        Mood and Affect: Mood normal.        Thought Content: Thought content normal.     Wt Readings from Last 3 Encounters:  04/10/23 187 lb (84.8 kg)  03/06/23 185 lb 6.4 oz (84.1 kg)  02/05/23 185 lb (83.9 kg)    BP 116/70   Pulse 65   Ht 6\' 1"  (1.854 m)   Wt 187 lb (84.8 kg)   SpO2 97%   BMI 24.67 kg/m   Assessment and Plan:  Problem List Items Addressed This Visit       Unprioritized   BPH with lower urinary tract symptoms without urinary obstruction    Symptoms improved with Flomax Seen by urology with reassuring exam      Relevant Orders   PSA   Essential (primary) hypertension (Chronic)    Normal exam with stable BP on lisinopril and metoprolol. No concerns or side effects to current medication. No change  in regimen; continue low sodium diet.       Relevant Orders   CBC with Differential/Platelet   Comprehensive metabolic panel   Mixed hyperlipidemia (Chronic)    LDL is  Lab Results  Component Value Date   LDLCALC 78 04/06/2022   Currently being treated with simvastatin with good  compliance and no concerns.       Relevant Orders   Lipid panel   Prediabetes (Chronic)    Managed with diet changes. Lab Results  Component Value Date   HGBA1C 6.0 (H) 04/06/2022         Relevant Orders   Hemoglobin A1c   Tobacco use disorder (Chronic)    Participating in LDCT screening      Other Visit Diagnoses     Annual physical exam    -  Primary   up to date on screenings continue regular exercise   Need for influenza vaccination       Relevant Orders   Flu Vaccine Trivalent High Dose (Fluad) (Completed)       Return in about 6 months (around 10/09/2023) for HTN.    Reubin Milan, MD [facility] [place]

## 2023-04-10 NOTE — Assessment & Plan Note (Signed)
Participating in LDCT screening

## 2023-04-10 NOTE — Assessment & Plan Note (Signed)
Managed with diet changes. Lab Results  Component Value Date   HGBA1C 6.0 (H) 04/06/2022

## 2023-04-10 NOTE — Assessment & Plan Note (Signed)
LDL is  Lab Results  Component Value Date   LDLCALC 78 04/06/2022   Currently being treated with simvastatin with good compliance and no concerns.

## 2023-04-10 NOTE — Assessment & Plan Note (Signed)
 Normal exam with stable BP on lisinopril and metoprolol. No concerns or side effects to current medication. No change in regimen; continue low sodium diet.

## 2023-04-11 LAB — HEMOGLOBIN A1C
Est. average glucose Bld gHb Est-mCnc: 111 mg/dL
Hgb A1c MFr Bld: 5.5 % (ref 4.8–5.6)

## 2023-04-11 LAB — COMPREHENSIVE METABOLIC PANEL
ALT: 33 [IU]/L (ref 0–44)
AST: 26 [IU]/L (ref 0–40)
Albumin: 4.3 g/dL (ref 3.9–4.9)
Alkaline Phosphatase: 68 [IU]/L (ref 44–121)
BUN/Creatinine Ratio: 20 (ref 10–24)
BUN: 19 mg/dL (ref 8–27)
Bilirubin Total: 0.3 mg/dL (ref 0.0–1.2)
CO2: 21 mmol/L (ref 20–29)
Calcium: 9.8 mg/dL (ref 8.6–10.2)
Chloride: 98 mmol/L (ref 96–106)
Creatinine, Ser: 0.94 mg/dL (ref 0.76–1.27)
Globulin, Total: 2.2 g/dL (ref 1.5–4.5)
Glucose: 95 mg/dL (ref 70–99)
Potassium: 4.4 mmol/L (ref 3.5–5.2)
Sodium: 136 mmol/L (ref 134–144)
Total Protein: 6.5 g/dL (ref 6.0–8.5)
eGFR: 89 mL/min/{1.73_m2} (ref >59)

## 2023-04-11 LAB — CBC WITH DIFFERENTIAL/PLATELET
Basophils Absolute: 0.1 10*3/uL (ref 0.0–0.2)
Basos: 1 %
EOS (ABSOLUTE): 0.1 10*3/uL (ref 0.0–0.4)
Eos: 1 %
Hematocrit: 47.7 % (ref 37.5–51.0)
Hemoglobin: 16.7 g/dL (ref 13.0–17.7)
Immature Grans (Abs): 0 10*3/uL (ref 0.0–0.1)
Immature Granulocytes: 0 %
Lymphocytes Absolute: 2.4 10*3/uL (ref 0.7–3.1)
Lymphs: 28 %
MCH: 34.6 pg — ABNORMAL HIGH (ref 26.6–33.0)
MCHC: 35 g/dL (ref 31.5–35.7)
MCV: 99 fL — ABNORMAL HIGH (ref 79–97)
Monocytes Absolute: 0.7 10*3/uL (ref 0.1–0.9)
Monocytes: 8 %
Neutrophils Absolute: 5.4 10*3/uL (ref 1.4–7.0)
Neutrophils: 62 %
Platelets: 203 10*3/uL (ref 150–450)
RBC: 4.83 x10E6/uL (ref 4.14–5.80)
RDW: 12.9 % (ref 11.6–15.4)
WBC: 8.6 10*3/uL (ref 3.4–10.8)

## 2023-04-11 LAB — PSA: Prostate Specific Ag, Serum: 1.9 ng/mL (ref 0.0–4.0)

## 2023-04-11 LAB — LIPID PANEL
Chol/HDL Ratio: 3.8 {ratio} (ref 0.0–5.0)
Cholesterol, Total: 118 mg/dL (ref 100–199)
HDL: 31 mg/dL — ABNORMAL LOW (ref >39)
LDL Chol Calc (NIH): 65 mg/dL (ref 0–99)
Triglycerides: 124 mg/dL (ref 0–149)
VLDL Cholesterol Cal: 22 mg/dL (ref 5–40)

## 2023-04-15 DIAGNOSIS — H40003 Preglaucoma, unspecified, bilateral: Secondary | ICD-10-CM | POA: Diagnosis not present

## 2023-04-15 DIAGNOSIS — H353131 Nonexudative age-related macular degeneration, bilateral, early dry stage: Secondary | ICD-10-CM | POA: Diagnosis not present

## 2023-04-15 DIAGNOSIS — Z961 Presence of intraocular lens: Secondary | ICD-10-CM | POA: Diagnosis not present

## 2023-04-15 DIAGNOSIS — H353121 Nonexudative age-related macular degeneration, left eye, early dry stage: Secondary | ICD-10-CM | POA: Diagnosis not present

## 2023-04-15 DIAGNOSIS — H353111 Nonexudative age-related macular degeneration, right eye, early dry stage: Secondary | ICD-10-CM | POA: Diagnosis not present

## 2023-05-15 ENCOUNTER — Other Ambulatory Visit
Admission: RE | Admit: 2023-05-15 | Discharge: 2023-05-15 | Disposition: A | Discharge status: Home / Self Care | Payer: Medicare HMO | Source: Ambulatory Visit | Attending: Physician Assistant | Admitting: Physician Assistant

## 2023-05-15 DIAGNOSIS — I10 Essential (primary) hypertension: Secondary | ICD-10-CM | POA: Diagnosis not present

## 2023-05-15 DIAGNOSIS — F17200 Nicotine dependence, unspecified, uncomplicated: Secondary | ICD-10-CM | POA: Diagnosis not present

## 2023-05-15 DIAGNOSIS — R0789 Other chest pain: Secondary | ICD-10-CM | POA: Insufficient documentation

## 2023-05-15 DIAGNOSIS — I25118 Atherosclerotic heart disease of native coronary artery with other forms of angina pectoris: Secondary | ICD-10-CM | POA: Diagnosis not present

## 2023-05-15 DIAGNOSIS — E785 Hyperlipidemia, unspecified: Secondary | ICD-10-CM | POA: Diagnosis not present

## 2023-05-15 LAB — TROPONIN I (HIGH SENSITIVITY): Troponin I (High Sensitivity): 3 ng/L (ref <18)

## 2023-05-22 DIAGNOSIS — I25118 Atherosclerotic heart disease of native coronary artery with other forms of angina pectoris: Secondary | ICD-10-CM | POA: Diagnosis not present

## 2023-07-17 ENCOUNTER — Other Ambulatory Visit (INDEPENDENT_AMBULATORY_CARE_PROVIDER_SITE_OTHER): Payer: Self-pay

## 2023-07-17 ENCOUNTER — Telehealth (INDEPENDENT_AMBULATORY_CARE_PROVIDER_SITE_OTHER): Payer: Self-pay

## 2023-07-17 MED ORDER — CILOSTAZOL 50 MG PO TABS: 50.0000 mg | ORAL_TABLET | Freq: Two times a day (BID) | ORAL | 3 refills | Status: DC

## 2023-07-17 NOTE — Telephone Encounter (Signed)
 Based on notes from [facility], Dr. Darrold Junker requested that we fill pletal as we were the original prescribers.  Since it has been filled, no further action necessary

## 2023-07-17 NOTE — Telephone Encounter (Signed)
 Patient called for a refill on Pletal  50 mg. I sent 60 qty with 3 refills to [facility].  According to Dr. Marea notes he is on Dual therapy, but patient is taking Pletal  50 mg, Plavix  and Asprin 325 mg. The Pletal  had to be approved by Dr. Paraschos at [facility] by the pharmacy before they would fill it.

## 2023-07-18 ENCOUNTER — Other Ambulatory Visit: Payer: Self-pay

## 2023-07-18 ENCOUNTER — Telehealth: Payer: Self-pay | Admitting: Urology

## 2023-07-18 DIAGNOSIS — N401 Enlarged prostate with lower urinary tract symptoms: Secondary | ICD-10-CM

## 2023-07-18 MED ORDER — TAMSULOSIN HCL 0.4 MG PO CAPS: 0.4000 mg | ORAL_CAPSULE | Freq: Every day | ORAL | 2 refills | Status: DC

## 2023-07-18 NOTE — Telephone Encounter (Signed)
 Med erxed to [facility].   Pt aware.

## 2023-07-18 NOTE — Telephone Encounter (Signed)
 Patient called and stated that he has changed insurance (he now has [facility] Medicare) and has a Geologist, engineering. He is requesting refill for Tamsulosin be sent to [facility], [phone].  Insurance updated in system.

## 2023-07-30 DIAGNOSIS — F17200 Nicotine dependence, unspecified, uncomplicated: Secondary | ICD-10-CM | POA: Diagnosis not present

## 2023-07-30 DIAGNOSIS — I10 Essential (primary) hypertension: Secondary | ICD-10-CM | POA: Diagnosis not present

## 2023-07-30 DIAGNOSIS — I70219 Atherosclerosis of native arteries of extremities with intermittent claudication, unspecified extremity: Secondary | ICD-10-CM | POA: Diagnosis not present

## 2023-07-30 DIAGNOSIS — E785 Hyperlipidemia, unspecified: Secondary | ICD-10-CM | POA: Diagnosis not present

## 2023-08-02 DIAGNOSIS — R0989 Other specified symptoms and signs involving the circulatory and respiratory systems: Secondary | ICD-10-CM | POA: Diagnosis not present

## 2023-08-13 ENCOUNTER — Encounter (INDEPENDENT_AMBULATORY_CARE_PROVIDER_SITE_OTHER): Payer: Self-pay | Admitting: Vascular Surgery

## 2023-08-13 ENCOUNTER — Ambulatory Visit (INDEPENDENT_AMBULATORY_CARE_PROVIDER_SITE_OTHER): Payer: Medicare Other | Admitting: Vascular Surgery

## 2023-08-13 ENCOUNTER — Ambulatory Visit (INDEPENDENT_AMBULATORY_CARE_PROVIDER_SITE_OTHER): Payer: Medicare Other

## 2023-08-13 VITALS — BP 117/63 | HR 60 | Resp 16 | Wt 193.4 lb

## 2023-08-13 DIAGNOSIS — I70219 Atherosclerosis of native arteries of extremities with intermittent claudication, unspecified extremity: Secondary | ICD-10-CM | POA: Diagnosis not present

## 2023-08-13 DIAGNOSIS — I10 Essential (primary) hypertension: Secondary | ICD-10-CM | POA: Diagnosis not present

## 2023-08-13 DIAGNOSIS — E782 Mixed hyperlipidemia: Secondary | ICD-10-CM | POA: Diagnosis not present

## 2023-08-13 NOTE — Progress Notes (Signed)
 MRN : [id number]  Aaron [name] is a 68 y.o. ([birth year]) male who presents with chief complaint of  Chief Complaint  Patient presents with   Follow-up    Ultrasound follow up  .  History of Present Illness: Patient returns today in follow up of his PAD. He is doing well. He denies any ischemic rest pain, lifestyle limiting claudication, or ulceration. He has undergone extensive lower extremity revascularization bilaterally. His most recent procedure was a little over a year ago with a left femoral endarterectomy and left SFA/popliteal stents.  His ABIs today are 1.01 on the right and 1.02 on the left with multiphasic waveforms and good digital pressures.  Current Outpatient Medications  Medication Sig Dispense Refill   albuterol  (VENTOLIN  HFA) 108 (90 Base) MCG/ACT inhaler Inhale 2 puffs into the lungs every 6 (six) hours as needed for wheezing or shortness of breath. 6.7 g 5   aspirin  325 MG tablet Take 325 mg by mouth daily.     betamethasone  valerate (VALISONE ) 0.1 % cream Apply topically 2 (two) times daily. To rash on buttocks 45 g 1   BREZTRI AEROSPHERE 160-9-4.8 MCG/ACT AERO      cilostazol  (PLETAL ) 50 MG tablet Take 1 tablet (50 mg total) by mouth 2 (two) times daily. 60 tablet 3   clopidogrel  (PLAVIX ) 75 MG tablet Take 1 tablet by mouth daily.     indomethacin  (INDOCIN ) 25 MG capsule Take 1 capsule (25 mg total) by mouth 2 (two) times daily as needed. 30 capsule 1   isosorbide  mononitrate (IMDUR ) 120 MG 24 hr tablet Take 1 tablet (120 mg total) by mouth daily. 30 tablet 0   lisinopril  (PRINIVIL ,ZESTRIL ) 10 MG tablet Take 1 tablet by mouth daily.     metoprolol  tartrate (LOPRESSOR ) 50 MG tablet Take 50-75 mg by mouth 2 (two) times a day. Take 50MG  by mouth every morning and 75MG  every evening     nitroGLYCERIN  (NITROSTAT ) 0.4 MG SL tablet Place 1 tablet under the tongue as needed.     simvastatin  (ZOCOR ) 40 MG tablet Take 1 tablet (40 mg total) by mouth at bedtime. 30 tablet 5    tamsulosin  (FLOMAX ) 0.4 MG CAPS capsule Take 1 capsule (0.4 mg total) by mouth at bedtime. 90 capsule 2   No current facility-administered medications for this visit.    Past Medical History:  Diagnosis Date   Aortic atherosclerosis (HCC)    COPD (chronic obstructive pulmonary disease) (HCC)    Coronary artery disease 06/19/2010   a.) LHC/PCI (unknown date) --> stent to pRCA and pLAD (unknown type); b.) LHC 06/19/2010: 30 mLM, 60 mLAD, 60 pLCx, 50 pRI, 40 ISR pRCA, 50 dRCA, 50 RPDA, 50 RPLS --> med mgmt; c.) LHC 07/08/2014: 10 pLM, 30 dLAD, 75 oLCx, 30 mRCA, 40 RPDA, patent RCA stent --> med mgmt; d.) LHC 12/28/2021: 50 mRCA, 65 RPDA, 45 m-dRCA, 60 o-pRCA, 95 o-pLCx, 60 RI, 30 pLAD, 30 mLM --> med mgmt.   Diastolic dysfunction 09/16/2017   a.) TTE 09/16/2017: ED 55-60%, triv AR/MR, G1DD   DVT (deep venous thrombosis) (HCC) 04/10/2022   left groin   Fracture of left ankle, closed, initial encounter 10/23/2017   GERD (gastroesophageal reflux disease)    Glaucoma    Gout    Hepatic steatosis    Hypercholesteremia    Hypertension    Long term current use of antithrombotics/antiplatelets    a.) cilostazol  + full dose ASA + clopidogrel    PAD (peripheral artery disease) (HCC)  a.) s/p multiple PTA procedures to lower extremities   Pain in limb 07/13/2016   Syncope 09/16/2017   Unstable angina (HCC) 01/12/2017   Vertigo     Past Surgical History:  Procedure Laterality Date   APPENDECTOMY     CATARACT EXTRACTION W/PHACO Right 10/26/2021   Procedure: CATARACT EXTRACTION PHACO AND INTRAOCULAR LENS PLACEMENT (IOC) RIGHT OMIDRIA   10.98 01:18.2;  Surgeon: Enola Feliciano Hugger, MD;  Location: [facility];  Service: Ophthalmology;  Laterality: Right;   CATARACT EXTRACTION W/PHACO Left 11/16/2021   Procedure: CATARACT EXTRACTION PHACO AND INTRAOCULAR LENS PLACEMENT (IOC) LEFT;  Surgeon: Enola Feliciano Hugger, MD;  Location: [facility];  Service: Ophthalmology;   Laterality: Left;  6.91 0:46.0   COLONOSCOPY  03/30/2013   [place]   COLONOSCOPY WITH PROPOFOL  N/A 12/10/2019   Procedure: COLONOSCOPY WITH PROPOFOL ;  Surgeon: Jinny Carmine, MD;  Location: [facility];  Service: Endoscopy;  Laterality: N/A;   COLONOSCOPY WITH PROPOFOL  N/A 01/18/2020   Procedure: COLONOSCOPY WITH PROPOFOL ;  Surgeon: Jinny Carmine, MD;  Location: [facility];  Service: Endoscopy;  Laterality: N/A;  4   CORONARY ANGIOPLASTY WITH STENT PLACEMENT Left    Procedure: CORONARY ANGIOPLASTY WITH STENT PLACEMENT   ENDARTERECTOMY FEMORAL Left 04/11/2022   Procedure: ENDARTERECTOMY FEMORAL ( SFA & TIBIAL); insertion of SFA stent;  Surgeon: Marea Selinda RAMAN, MD;  Location: [facility] ORS;  Service: Vascular;  Laterality: Left;   LEFT HEART CATH AND CORONARY ANGIOGRAPHY N/A 12/29/2020   Procedure: LEFT HEART CATH AND CORONARY ANGIOGRAPHY;  Surgeon: Hester Wolm PARAS, MD;  Location: [facility] INVASIVE CV LAB;  Service: Cardiovascular;  Laterality: N/A;   LEFT HEART CATH AND CORONARY ANGIOGRAPHY Left 06/19/2010   Procedure: LEFT HEART CATH AND CORONARY ANGIOGRAPHY; Location: [facility]; Surgeon: Vita Bathe, MD   LEFT HEART CATH AND CORONARY ANGIOGRAPHY Left 07/08/2014   Procedure: LEFT HEART CATH AND CORONARY ANGIOGRAPHY; Location: [facility]; Surgeon: Marsa Dooms, MD   LOWER EXTREMITY ANGIOGRAM Left 04/11/2022   Procedure: LOWER EXTREMITY ANGIOGRAM;  Surgeon: Marea Selinda RAMAN, MD;  Location: [facility] OR[facility];  Service: Vascular;  Laterality: Left;   LOWER EXTREMITY ANGIOGRAPHY Right 09/25/2018   Procedure: LOWER EXTREMITY ANGIOGRAPHY;  Surgeon: Marea Selinda RAMAN, MD;  Location: [facility] INV[facility] CV LAB;  Service: Cardiovascular;  Laterality: Right;   LOWER EXTREMITY ANGIOGRAPHY Left 03/17/2020   Procedure: LOWER EXTREMITY ANGIOGRAPHY;  Surgeon: Marea Selinda RAMAN, MD;  Location: [facility] INV[facility] CV [facility];  Service: Cardiovascular;  Laterality: Left;   LOWER EXTREMITY ANGIOGRAPHY Right 02/22/2022   Procedure: Lower Extremity  Angiography;  Surgeon: Marea Selinda RAMAN, MD;  Location: [facility] INV[facility] CV LAB;  Service: Cardiovascular;  Laterality: Right;   POLYPECTOMY N/A 01/18/2020   Procedure: POLYPECTOMY;  Surgeon: Jinny Carmine, MD;  Location: [facility];  Service: Endoscopy;  Laterality: N/A;     Social History   Tobacco Use   Smoking status: Every Day    Types: Cigars   Smokeless tobacco: Never  Vaping Use   Vaping status: Never Used  Substance Use Topics   Alcohol use: Yes    Comment: quit drinking around 04/2019(May have drink 1x/mo)   Drug use: No      Family History  Problem Relation Age of Onset   Hypertension Mother    Diabetes Mother    Heart failure Father        age 53   Cancer Father    Ovarian cancer Sister    Heart disease Brother      Allergies  Allergen Reactions  Contrast Media [Iodinated Contrast Media] Hives   Iodine  Hives     REVIEW OF SYSTEMS (Negative unless checked)   Constitutional: [] Weight loss  [] Fever  [] Chills Cardiac: [] Chest pain   [] Chest pressure   [] Palpitations   [] Shortness of breath when laying flat   [] Shortness of breath at rest   [] Shortness of breath with exertion. Vascular:  [x] Pain in legs with walking   [] Pain in legs at rest   [] Pain in legs when laying flat   [x] Claudication   [] Pain in feet when walking  [] Pain in feet at rest  [] Pain in feet when laying flat   [] History of DVT   [] Phlebitis   [x] Swelling in legs   [] Varicose veins   [] Non-healing ulcers Pulmonary:   [] Uses home oxygen   [] Productive cough   [] Hemoptysis   [] Wheeze  [] COPD   [] Asthma Neurologic:  [] Dizziness  [] Blackouts   [] Seizures   [] History of stroke   [] History of TIA  [] Aphasia   [] Temporary blindness   [] Dysphagia   [] Weakness or numbness in arms   [] Weakness or numbness in legs Musculoskeletal:  [x] Arthritis   [] Joint swelling   [x] Joint pain   [] Low back pain Hematologic:  [] Easy bruising  [] Easy bleeding   [] Hypercoagulable state   [] Anemic   Gastrointestinal:   [] Blood in stool   [] Vomiting blood  [] Gastroesophageal reflux/heartburn   [] Abdominal pain Genitourinary:  [] Chronic kidney disease   [] Difficult urination  [] Frequent urination  [] Burning with urination   [] Hematuria Skin:  [] Rashes   [] Ulcers   [] Wounds Psychological:  [] History of anxiety   []  History of major depression.  Physical Examination  BP 117/63   Pulse 60   Resp 16   Wt 193 lb 6.4 oz (87.7 kg)   BMI 25.52 kg/m  Gen:  WD/WN, NAD Head: [place]/AT, No temporalis wasting. Ear/Nose/Throat: Hearing grossly intact, nares w/o erythema or drainage Eyes: Conjunctiva clear. Sclera non-icteric Neck: Supple.  Trachea midline Pulmonary:  Good air movement, no use of accessory muscles.  Cardiac: RRR, no JVD Vascular:  Vessel Right Left  Radial Palpable Palpable                          PT Palpable Palpable  DP Palpable Palpable   Gastrointestinal: soft, non-tender/non-distended. No guarding/reflex.  Musculoskeletal: M/S 5/5 throughout.  No deformity or atrophy. No edema. Neurologic: Sensation grossly intact in extremities.  Symmetrical.  Speech is fluent.  Psychiatric: Judgment intact, Mood & affect appropriate for pt's clinical situation. Dermatologic: No rashes or ulcers noted.  No cellulitis or open wounds.      Labs Recent Results (from the past 2160 hours)  Troponin I (High Sensitivity)     Status: None   Collection Time: 05/15/23 12:05 PM  Result Value Ref Range   Troponin I (High Sensitivity) 3 <18 ng/L    Comment: (NOTE) Elevated high sensitivity troponin I (hsTnI) values and significant  changes across serial measurements may suggest ACS but many other  chronic and acute conditions are known to elevate hsTnI results.  Refer to the Links section for chest pain algorithms and additional  guidance. Performed at [facility], [address] [place], [place] [address]     Radiology No results found.  Assessment/Plan  Essential (primary)  hypertension blood pressure control important in reducing the progression of atherosclerotic disease. On appropriate oral medications.     Mixed hyperlipidemia lipid control important in reducing the progression of atherosclerotic disease. Continue  statin therapy   Atherosclerosis of artery of extremity with intermittent claudication (HCC) His ABIs today are 1.01 on the right and 1.02 on the left with multiphasic waveforms and good digital pressures.  Continue current medical regimen. This includes dual antiplatelet therapy. Follow-up in 6 months with noninvasive studies.     Selinda Gu, MD  08/13/2023 10:25 AM    This note was created with Dragon medical transcription system.  Any errors from dictation are purely unintentional

## 2023-08-16 LAB — VAS US ABI WITH/WO TBI
Left ABI: 1.02
Right ABI: 1.01

## 2023-09-18 ENCOUNTER — Encounter: Payer: Self-pay | Admitting: Internal Medicine

## 2023-09-18 ENCOUNTER — Ambulatory Visit (INDEPENDENT_AMBULATORY_CARE_PROVIDER_SITE_OTHER): Payer: PPO | Admitting: Internal Medicine

## 2023-09-18 VITALS — BP 114/68 | HR 66 | Ht 73.0 in | Wt 197.1 lb

## 2023-09-18 DIAGNOSIS — L409 Psoriasis, unspecified: Secondary | ICD-10-CM

## 2023-09-18 DIAGNOSIS — I70203 Unspecified atherosclerosis of native arteries of extremities, bilateral legs: Secondary | ICD-10-CM | POA: Diagnosis not present

## 2023-09-18 DIAGNOSIS — M109 Gout, unspecified: Secondary | ICD-10-CM

## 2023-09-18 DIAGNOSIS — I25119 Atherosclerotic heart disease of native coronary artery with unspecified angina pectoris: Secondary | ICD-10-CM

## 2023-09-18 DIAGNOSIS — J449 Chronic obstructive pulmonary disease, unspecified: Secondary | ICD-10-CM | POA: Diagnosis not present

## 2023-09-18 DIAGNOSIS — I10 Essential (primary) hypertension: Secondary | ICD-10-CM | POA: Diagnosis not present

## 2023-09-18 MED ORDER — INDOMETHACIN 25 MG PO CAPS: 25.0000 mg | ORAL_CAPSULE | Freq: Two times a day (BID) | ORAL | 1 refills | Status: DC | PRN

## 2023-09-18 MED ORDER — BETAMETHASONE VALERATE 0.1 % EX CREA: TOPICAL_CREAM | Freq: Two times a day (BID) | CUTANEOUS | 1 refills | Status: AC

## 2023-09-18 NOTE — Assessment & Plan Note (Signed)
 Continue Betamethasone for control. Recommend Dermatology follow up if worsening despite treatment

## 2023-09-18 NOTE — Assessment & Plan Note (Signed)
 Controlled BP on metoprolol and lisinopril.

## 2023-09-18 NOTE — Assessment & Plan Note (Signed)
 Stopped Breztri due to [employer] of discomfort in chest Now using albuterol PRN - about once a week

## 2023-09-18 NOTE — Assessment & Plan Note (Signed)
 Stable without chest pains. Followed by Cardiology; BP controlled On statin and anti platelet therapy

## 2023-09-18 NOTE — Assessment & Plan Note (Signed)
 Claudication resolved after interventions bilaterally

## 2023-09-18 NOTE — Assessment & Plan Note (Signed)
 Continue PRN indomethacin No recent flares

## 2023-09-18 NOTE — Progress Notes (Signed)
 Date:  09/18/2023   Name:  Aaron [name]   DOB:  [birth year]   MRN:  [id number]   Chief Complaint: Hypertension  Hypertension This is a chronic problem. The problem is controlled. Associated symptoms include shortness of breath (mild, intermittent). Pertinent negatives include no chest pain, headaches or palpitations. Past treatments include ACE inhibitors and beta blockers. Hypertensive end-organ damage includes CAD/MI. There is no history of kidney disease or CVA.  Psoriasis Gout PVD CAD COPD  Review of Systems  Constitutional:  Negative for fatigue and unexpected weight change.  HENT:  Negative for trouble swallowing.   Eyes:  Negative for visual disturbance.  Respiratory:  Positive for shortness of breath (mild, intermittent). Negative for cough, chest tightness and wheezing.   Cardiovascular:  Negative for chest pain, palpitations and leg swelling.  Gastrointestinal:  Negative for abdominal pain, constipation and diarrhea.  Genitourinary:  Negative for frequency and urgency.  Neurological:  Negative for dizziness, weakness, light-headedness and headaches.  Psychiatric/Behavioral:  Negative for dysphoric mood and sleep disturbance. The patient is not nervous/anxious.      Lab Results  Component Value Date   NA 136 04/10/2023   K 4.4 04/10/2023   CO2 21 04/10/2023   GLUCOSE 95 04/10/2023   BUN 19 04/10/2023   CREATININE 0.94 04/10/2023   CALCIUM 9.8 04/10/2023   EGFR 89 04/10/2023   GFRNONAA >60 09/17/2022   Lab Results  Component Value Date   CHOL 118 04/10/2023   HDL 31 (L) 04/10/2023   LDLCALC 65 04/10/2023   TRIG 124 04/10/2023   CHOLHDL 3.8 04/10/2023   Lab Results  Component Value Date   TSH 3.214 02/06/2019   Lab Results  Component Value Date   HGBA1C 5.5 04/10/2023   Lab Results  Component Value Date   WBC 8.6 04/10/2023   HGB 16.7 04/10/2023   HCT 47.7 04/10/2023   MCV 99 (H) 04/10/2023   PLT 203 04/10/2023   Lab Results  Component  Value Date   ALT 33 04/10/2023   AST 26 04/10/2023   ALKPHOS 68 04/10/2023   BILITOT 0.3 04/10/2023   No results found for: "25OHVITD2", "25OHVITD3", "VD25OH"   Patient Active Problem List   Diagnosis Date Noted   BPH with lower urinary tract symptoms without urinary obstruction 04/10/2023   Atherosclerosis of artery of both lower extremities (HCC) 04/11/2022   Hepatic steatosis 01/04/2022   Aortic atherosclerosis (HCC) 07/10/2020   Stage 2 moderate COPD by GOLD classification (HCC) 05/16/2020   Dyspepsia and disorder of function of stomach 05/16/2020   Cervical disc disorder of mid-cervical region 05/10/2020   Polyp of descending colon    Mixed hyperlipidemia 10/28/2018   Atherosclerosis of native arteries of extremity with intermittent claudication (HCC) 07/13/2016   Tobacco use disorder 07/13/2016   Prediabetes 07/27/2015   Controlled gout 12/22/2014   Coronary artery disease involving native coronary artery of native heart with angina pectoris (HCC) 10/09/2014   Essential (primary) hypertension 10/09/2014   Palmar fascial fibromatosis (dupuytren) 10/09/2014   Psoriasis 10/09/2014   H/O cardiac catheterization 01/27/2014   Peripheral vascular disease (HCC) 01/27/2014    Allergies  Allergen Reactions   Contrast Media [Iodinated Contrast Media] Hives   Iodine Hives    Past Surgical History:  Procedure Laterality Date   APPENDECTOMY     CATARACT EXTRACTION W/PHACO Right 10/26/2021   Procedure: CATARACT EXTRACTION PHACO AND INTRAOCULAR LENS PLACEMENT (IOC) RIGHT OMIDRIA  10.98 01:18.2;  Surgeon: Estanislado Pandy, MD;  Location: [facility]  SURGERY [facility];  Service: Ophthalmology;  Laterality: Right;   CATARACT EXTRACTION W/PHACO Left 11/16/2021   Procedure: CATARACT EXTRACTION PHACO AND INTRAOCULAR LENS PLACEMENT (IOC) LEFT;  Surgeon: Estanislado Pandy, MD;  Location: [facility];  Service: Ophthalmology;  Laterality: Left;  6.91 0:46.0   COLONOSCOPY   03/30/2013   [place]   COLONOSCOPY WITH PROPOFOL N/A 12/10/2019   Procedure: COLONOSCOPY WITH PROPOFOL;  Surgeon: Midge Minium, MD;  Location: [facility];  Service: Endoscopy;  Laterality: N/A;   COLONOSCOPY WITH PROPOFOL N/A 01/18/2020   Procedure: COLONOSCOPY WITH PROPOFOL;  Surgeon: Midge Minium, MD;  Location: [facility];  Service: Endoscopy;  Laterality: N/A;  4   CORONARY ANGIOPLASTY WITH STENT PLACEMENT Left    Procedure: CORONARY ANGIOPLASTY WITH STENT PLACEMENT   ENDARTERECTOMY FEMORAL Left 04/11/2022   Procedure: ENDARTERECTOMY FEMORAL ( SFA & TIBIAL); insertion of SFA stent;  Surgeon: Annice Needy, MD;  Location: [facility] ORS;  Service: Vascular;  Laterality: Left;   LEFT HEART CATH AND CORONARY ANGIOGRAPHY N/A 12/29/2020   Procedure: LEFT HEART CATH AND CORONARY ANGIOGRAPHY;  Surgeon: Lamar Blinks, MD;  Location: [facility] INVAS[facility] CV LAB;  Service: Cardiovascular;  Laterality: N/A;   LEFT HEART CATH AND CORONARY ANGIOGRAPHY Left 06/19/2010   Procedure: LEFT HEART CATH AND CORONARY ANGIOGRAPHY; Location: [facility]; Surgeon: Despina Hick, MD   LEFT HEART CATH AND CORONARY ANGIOGRAPHY Left 07/08/2014   Procedure: LEFT HEART CATH AND CORONARY ANGIOGRAPHY; Location: [facility]; Surgeon: Marcina Millard, MD   LOWER EXTREMITY ANGIOGRAM Left 04/11/2022   Procedure: LOWER EXTREMITY ANGIOGRAM;  Surgeon: Annice Needy, MD;  Location: [facility] OR[facility];  Service: Vascular;  Laterality: Left;   LOWER EXTREMITY ANGIOGRAPHY Right 09/25/2018   Procedure: LOWER EXTREMITY ANGIOGRAPHY;  Surgeon: Annice Needy, MD;  Location: [facility] INV[facility] CV LAB;  Service: Cardiovascular;  Laterality: Right;   LOWER EXTREMITY ANGIOGRAPHY Left 03/17/2020   Procedure: LOWER EXTREMITY ANGIOGRAPHY;  Surgeon: Annice Needy, MD;  Location: [facility] INV[facility] CV LAB;  Service: Cardiovascular;  Laterality: Left;   LOWER EXTREMITY ANGIOGRAPHY Right 02/22/2022   Procedure: Lower Extremity Angiography;  Surgeon: Annice Needy, MD;   Location: [facility] INVASIVE CV LAB;  Service: Cardiovascular;  Laterality: Right;   POLYPECTOMY N/A 01/18/2020   Procedure: POLYPECTOMY;  Surgeon: Midge Minium, MD;  Location: [facility] SUR[facility];  Service: Endoscopy;  Laterality: N/A;    Social History   Tobacco Use   Smoking status: Every Day    Types: Cigars   Smokeless tobacco: Never  Vaping Use   Vaping status: Never Used  Substance Use Topics   Alcohol use: Yes    Comment: quit drinking around 04/2019(May have drink 1x/mo)   Drug use: No     Medication list has been reviewed and updated.  Current Meds  Medication Sig   albuterol (VENTOLIN HFA) 108 (90 Base) MCG/ACT inhaler Inhale 2 puffs into the lungs every 6 (six) hours as needed for wheezing or shortness of breath.   aspirin 325 MG tablet Take 81 mg by mouth daily.   cilostazol (PLETAL) 50 MG tablet Take 1 tablet (50 mg total) by mouth 2 (two) times daily.   clopidogrel (PLAVIX) 75 MG tablet Take 1 tablet by mouth daily.   isosorbide mononitrate (IMDUR) 120 MG 24 hr tablet Take 1 tablet (120 mg total) by mouth daily.   lisinopril (PRINIVIL,ZESTRIL) 10 MG tablet Take 1 tablet by mouth daily.   metoprolol tartrate (LOPRESSOR) 50 MG tablet Take 50-75 mg by mouth 2 (two) times a  day. Take 50MG  by mouth every morning and 75MG  every evening   nitroGLYCERIN (NITROSTAT) 0.4 MG SL tablet Place 1 tablet under the tongue as needed.   simvastatin (ZOCOR) 40 MG tablet Take 1 tablet (40 mg total) by mouth at bedtime.   [DISCONTINUED] betamethasone valerate (VALISONE) 0.1 % cream Apply topically 2 (two) times daily. To rash on buttocks   [DISCONTINUED] indomethacin (INDOCIN) 25 MG capsule Take 1 capsule (25 mg total) by mouth 2 (two) times daily as needed.       09/18/2023   10:04 AM 04/10/2023    9:13 AM 01/30/2023    3:40 PM 10/08/2022    1:26 PM  GAD 7 : Generalized Anxiety Score  Nervous, Anxious, on Edge 0 0 0 0  Control/stop worrying 0 0 0 0  Worry too much - different things 0 0  0 0  Trouble relaxing 0 0 0 0  Restless 0 0 0 0  Easily annoyed or irritable 0 0 0 0  Afraid - awful might happen 0 0 0 0  Total GAD 7 Score 0 0 0 0  Anxiety Difficulty Not difficult at all Not difficult at all Not difficult at all Not difficult at all       09/18/2023   10:04 AM 04/10/2023    9:13 AM 01/30/2023    3:40 PM  Depression screen PHQ 2/9  Decreased Interest 0 0 0  Down, Depressed, Hopeless 0 0 0  PHQ - 2 Score 0 0 0  Altered sleeping 0 0 0  Tired, decreased energy 0 0 0  Change in appetite 0 0 0  Feeling bad or failure about yourself  0 0 0  Trouble concentrating 0 0 0  Moving slowly or fidgety/restless 0 0 0  Suicidal thoughts 0 0 0  PHQ-9 Score 0 0 0  Difficult doing work/chores Not difficult at all Not difficult at all Not difficult at all    BP Readings from Last 3 Encounters:  09/18/23 114/68  08/13/23 117/63  04/10/23 116/70    Physical Exam Vitals and nursing note reviewed.  Constitutional:      General: He is not in acute distress.    Appearance: He is well-developed.  HENT:     Head: Normocephalic and atraumatic.  Neck:     Vascular: No carotid bruit.  Cardiovascular:     Rate and Rhythm: Normal rate and regular rhythm.     Pulses: Normal pulses.     Heart sounds: No murmur heard. Pulmonary:     Effort: Pulmonary effort is normal. No respiratory distress.     Breath sounds: Decreased breath sounds present. No wheezing or rhonchi.  Musculoskeletal:     Cervical back: Normal range of motion.  Lymphadenopathy:     Cervical: No cervical adenopathy.  Skin:    General: Skin is warm and dry.     Findings: No rash.  Neurological:     Mental Status: He is alert and oriented to person, place, and time.  Psychiatric:        Attention and Perception: Attention and perception normal.        Mood and Affect: Mood normal.        Behavior: Behavior normal.     Wt Readings from Last 3 Encounters:  09/18/23 197 lb 2 oz (89.4 kg)  08/13/23 193 lb  6.4 oz (87.7 kg)  04/10/23 187 lb (84.8 kg)    BP 114/68   Pulse 66   Ht 6\' 1"  (  1.854 m)   Wt 197 lb 2 oz (89.4 kg)   SpO2 96%   BMI 26.01 kg/m   Assessment and Plan:  Problem List Items Addressed This Visit       Unprioritized   Coronary artery disease involving native coronary artery of native heart with angina pectoris (HCC) - Primary (Chronic)   Stable without chest pains. Followed by Cardiology; BP controlled On statin and anti platelet therapy      Essential (primary) hypertension (Chronic)   Controlled BP on metoprolol and lisinopril.       Psoriasis (Chronic)   Continue Betamethasone for control. Recommend Dermatology follow up if worsening despite treatment      Relevant Medications   betamethasone valerate (VALISONE) 0.1 % cream   Controlled gout (Chronic)   Continue PRN indomethacin No recent flares      Relevant Medications   indomethacin (INDOCIN) 25 MG capsule   Stage 2 moderate COPD by GOLD classification (HCC) (Chronic)   Stopped Breztri due to SE of discomfort in chest Now using albuterol PRN - about once a week      Atherosclerosis of artery of both lower extremities (HCC)   Claudication resolved after interventions bilaterally       Return in about 6 months (around 03/20/2024) for CPX.    Reubin Milan, MD [facility] [place]

## 2023-09-23 DIAGNOSIS — H02052 Trichiasis without entropian right lower eyelid: Secondary | ICD-10-CM | POA: Diagnosis not present

## 2023-09-23 DIAGNOSIS — H04203 Unspecified epiphora, bilateral lacrimal glands: Secondary | ICD-10-CM | POA: Diagnosis not present

## 2023-09-25 ENCOUNTER — Telehealth: Payer: Self-pay | Admitting: Internal Medicine

## 2023-09-25 NOTE — Telephone Encounter (Signed)
 Copied from CRM [id number]. Topic: Medicare AWV >> Sep 25, 2023 10:48 AM Payton Doughty wrote: Reason for CRM: Called 09/25/2023 to sched AWV - NO VOICEMAIL  Verlee Rossetti; Care Guide Ambulatory Clinical Support [place] [facility] Direct Dial: [phone]

## 2023-10-02 ENCOUNTER — Ambulatory Visit

## 2023-10-02 ENCOUNTER — Telehealth (INDEPENDENT_AMBULATORY_CARE_PROVIDER_SITE_OTHER): Payer: Self-pay

## 2023-10-02 ENCOUNTER — Other Ambulatory Visit (INDEPENDENT_AMBULATORY_CARE_PROVIDER_SITE_OTHER): Payer: Self-pay

## 2023-10-02 DIAGNOSIS — Z0000 Encounter for general adult medical examination without abnormal findings: Secondary | ICD-10-CM

## 2023-10-02 MED ORDER — CILOSTAZOL 50 MG PO TABS: 50.0000 mg | ORAL_TABLET | Freq: Two times a day (BID) | ORAL | 3 refills | Status: DC

## 2023-10-02 NOTE — Patient Instructions (Addendum)
 [name] , Thank you for taking time to come for your Medicare Wellness Visit. I appreciate your ongoing commitment to your health goals. Please review the following plan we discussed and let me know if I can assist you in the future.   Referrals/Orders/Follow-Ups/Clinician Recommendations: none  This is a list of the screening recommended for you and due dates:  Health Maintenance  Topic Date Due   Zoster (Shingles) Vaccine (2 of 2) 06/21/2023   COVID-19 Vaccine (4 - 2024-25 season) 10/04/2023*   DTaP/Tdap/Td vaccine (2 - Td or Tdap) 12/19/2023   Medicare Annual Wellness Visit  10/01/2024   Colon Cancer Screening  01/17/2025   Pneumonia Vaccine  Completed   Flu Shot  Completed   Hepatitis C Screening  Completed   HPV Vaccine  Aged Out   Screening for Lung Cancer  Discontinued  *Topic was postponed. The date shown is not the original due date.    Advanced directives: (In Chart) A copy of your advanced directives are scanned into your chart should your provider ever need it.  Next Medicare Annual Wellness Visit scheduled for next year: Yes   10/07/24 @ 2:00 pm by phone

## 2023-10-02 NOTE — Telephone Encounter (Signed)
 Aaron [name] called for a refill of Pletal sent to [facility]. 60 qty with 3 refills were sent electronically.

## 2023-10-02 NOTE — Progress Notes (Signed)
 Subjective:   Aaron [name] is a 68 y.o. who presents for a Medicare Wellness preventive visit.  Visit Complete: Virtual I connected with  Aaron [name] on 10/02/23 by a audio enabled telemedicine application and verified that I am speaking with the correct person using two identifiers.  Patient Location: Home  Provider Location: Office/Clinic  I discussed the limitations of evaluation and management by telemedicine. The patient expressed understanding and agreed to proceed.  Vital Signs: Because this visit was a virtual/telehealth visit, some criteria may be missing or patient reported. Any vitals not documented were not able to be obtained and vitals that have been documented are patient reported.  VideoDeclined- This patient declined Librarian, academic. Therefore the visit was completed with audio only.  Persons Participating in Visit: Patient.  AWV Questionnaire: [name]: Patient Medicare AWV questionnaire was not completed prior to this visit.  Cardiac Risk Factors include: advanced age (>8men, >19 women);dyslipidemia;male gender;hypertension     Objective:    There were [name] vitals filed for this visit. There is [name] height or weight on file to calculate BMI.     10/02/2023    4:04 PM 09/17/2022   10:59 AM 05/30/2022    1:20 PM 04/11/2022    5:14 PM 04/04/2022   10:51 AM 11/16/2021   11:32 AM 10/26/2021   11:29 AM  Advanced Directives  Does Patient Have a Medical Advance Directive? Yes Yes Yes [name] Yes [name] [name]  Type of Estate agent of [place];Living will Living will Living will  Living will    Does patient want to make changes to medical advance directive? [name] - Patient declined        Copy of Healthcare Power of Attorney in Chart? Yes - validated most recent copy scanned in chart (See row information)        Would patient like information on creating a medical advance directive?    [name] - Patient declined  [name] - Patient declined  Yes (MAU/Ambulatory/Procedural Areas - Information given)    Current Medications (verified) Outpatient Encounter Medications as of 10/02/2023  Medication Sig   albuterol (VENTOLIN HFA) 108 (90 Base) MCG/ACT inhaler Inhale 2 puffs into the lungs every 6 (six) hours as needed for wheezing or shortness of breath.   aspirin 325 MG tablet Take 81 mg by mouth daily.   betamethasone valerate (VALISONE) 0.1 % cream Apply topically 2 (two) times daily. To rash on buttocks   cilostazol (PLETAL) 50 MG tablet Take 1 tablet (50 mg total) by mouth 2 (two) times daily.   clopidogrel (PLAVIX) 75 MG tablet Take 1 tablet by mouth daily.   indomethacin (INDOCIN) 25 MG capsule Take 1 capsule (25 mg total) by mouth 2 (two) times daily as needed.   isosorbide mononitrate (IMDUR) 120 MG 24 hr tablet Take 1 tablet (120 mg total) by mouth daily.   lisinopril (PRINIVIL,ZESTRIL) 10 MG tablet Take 1 tablet by mouth daily.   metoprolol tartrate (LOPRESSOR) 50 MG tablet Take 50-75 mg by mouth 2 (two) times a day. Take 50MG  by mouth every morning and 75MG  every evening   nitroGLYCERIN (NITROSTAT) 0.4 MG SL tablet Place 1 tablet under the tongue as needed.   simvastatin (ZOCOR) 40 MG tablet Take 1 tablet (40 mg total) by mouth at bedtime.   [name] facility-administered encounter medications on file as of 10/02/2023.    Allergies (verified) Contrast media [iodinated contrast media] and Iodine   History: Past Medical History:  Diagnosis Date  Aortic atherosclerosis (HCC)    COPD (chronic obstructive pulmonary disease) (HCC)    Coronary artery disease 06/19/2010   a.) LHC/PCI (unknown date) --> stent to pRCA and pLAD (unknown type); b.) LHC 06/19/2010: 30 mLM, 60 mLAD, 60 pLCx, 50 pRI, 40 ISR pRCA, 50 dRCA, 50 RPDA, 50 RPLS --> med mgmt; c.) LHC 07/08/2014: 10 pLM, 30 dLAD, 75 oLCx, 30 mRCA, 40 RPDA, patent RCA stent --> med mgmt; d.) LHC 12/28/2021: 50 mRCA, 65 RPDA, 45 m-dRCA, 60 o-pRCA, 95 o-pLCx, 60 RI, 30 pLAD, 30 mLM  --> med mgmt.   Diastolic dysfunction 09/16/2017   a.) TTE 09/16/2017: ED 55-60%, triv AR/MR, G1DD   DVT (deep venous thrombosis) (HCC) 04/10/2022   left groin   Fracture of left ankle, closed, initial encounter 10/23/2017   GERD (gastroesophageal reflux disease)    Glaucoma    Gout    Hepatic steatosis    Hypercholesteremia    Hypertension    Long term current use of antithrombotics/antiplatelets    a.) cilostazol + full dose ASA + clopidogrel   PAD (peripheral artery disease) (HCC)    a.) s/p multiple PTA procedures to lower extremities   Pain in limb 07/13/2016   Syncope 09/16/2017   Unstable angina (HCC) 01/12/2017   Vertigo    Past Surgical History:  Procedure Laterality Date   APPENDECTOMY     CATARACT EXTRACTION W/PHACO Right 10/26/2021   Procedure: CATARACT EXTRACTION PHACO AND INTRAOCULAR LENS PLACEMENT (IOC) RIGHT OMIDRIA  10.98 01:18.2;  Surgeon: Estanislado Pandy, MD;  Location: [facility];  Service: Ophthalmology;  Laterality: Right;   CATARACT EXTRACTION W/PHACO Left 11/16/2021   Procedure: CATARACT EXTRACTION PHACO AND INTRAOCULAR LENS PLACEMENT (IOC) LEFT;  Surgeon: Estanislado Pandy, MD;  Location: [facility];  Service: Ophthalmology;  Laterality: Left;  6.91 0:46.0   COLONOSCOPY  03/30/2013   [place]   COLONOSCOPY WITH PROPOFOL N/A 12/10/2019   Procedure: COLONOSCOPY WITH PROPOFOL;  Surgeon: Midge Minium, MD;  Location: [facility];  Service: Endoscopy;  Laterality: N/A;   COLONOSCOPY WITH PROPOFOL N/A 01/18/2020   Procedure: COLONOSCOPY WITH PROPOFOL;  Surgeon: Midge Minium, MD;  Location: [facility];  Service: Endoscopy;  Laterality: N/A;  4   CORONARY ANGIOPLASTY WITH STENT PLACEMENT Left    Procedure: CORONARY ANGIOPLASTY WITH STENT PLACEMENT   ENDARTERECTOMY FEMORAL Left 04/11/2022   Procedure: ENDARTERECTOMY FEMORAL ( SFA & TIBIAL); insertion of SFA stent;  Surgeon: Annice Needy, MD;  Location: [facility];   Service: Vascular;  Laterality: Left;   LEFT HEART CATH AND CORONARY ANGIOGRAPHY N/A 12/29/2020   Procedure: LEFT HEART CATH AND CORONARY ANGIOGRAPHY;  Surgeon: Lamar Blinks, MD;  Location: [facility];  Service: Cardiovascular;  Laterality: N/A;   LEFT HEART CATH AND CORONARY ANGIOGRAPHY Left 06/19/2010   Procedure: LEFT HEART CATH AND CORONARY ANGIOGRAPHY; Location: [facility]; Surgeon: Despina Hick, MD   LEFT HEART CATH AND CORONARY ANGIOGRAPHY Left 07/08/2014   Procedure: LEFT HEART CATH AND CORONARY ANGIOGRAPHY; Location: [facility]; Surgeon: Marcina Millard, MD   LOWER EXTREMITY ANGIOGRAM Left 04/11/2022   Procedure: LOWER EXTREMITY ANGIOGRAM;  Surgeon: Annice Needy, MD;  Location: [facility];  Service: Vascular;  Laterality: Left;   LOWER EXTREMITY ANGIOGRAPHY Right 09/25/2018   Procedure: LOWER EXTREMITY ANGIOGRAPHY;  Surgeon: Annice Needy, MD;  Location: [facility];  Service: Cardiovascular;  Laterality: Right;   LOWER EXTREMITY ANGIOGRAPHY Left 03/17/2020   Procedure: LOWER EXTREMITY ANGIOGRAPHY;  Surgeon: Annice Needy, MD;  Location: [facility];  Service: Cardiovascular;  Laterality: Left;   LOWER EXTREMITY ANGIOGRAPHY Right 02/22/2022   Procedure: Lower Extremity Angiography;  Surgeon: Annice Needy, MD;  Location: [facility];  Service: Cardiovascular;  Laterality: Right;   POLYPECTOMY N/A 01/18/2020   Procedure: POLYPECTOMY;  Surgeon: Midge Minium, MD;  Location: [facility];  Service: Endoscopy;  Laterality: N/A;   Family History  Problem Relation Age of Onset   Hypertension Mother    Diabetes Mother    Heart failure Father        age 31   Cancer Father    Ovarian cancer Sister    Heart disease Brother    Social History   Socioeconomic History   Marital status: Single    Spouse name: Not on file   Number of children: 0   Years of education: Not on file   Highest education level: 12th grade  Occupational History   Occupation:  Retired/Disabled  Tobacco Use   Smoking status: Every Day    Types: Cigars   Smokeless tobacco: Never  Vaping Use   Vaping status: Never Used  Substance and Sexual Activity   Alcohol use: Yes    Comment: quit drinking around 04/2019(May have drink 1x/mo)   Drug use: [name]   Sexual activity: Not Currently    Birth control/protection: None  Other Topics Concern   Not on file  Social History Narrative   Independent at baseline. Lives alone. Does not drive.    Social Drivers of Corporate investment banker Strain: Low Risk  (10/02/2023)   Overall Financial Resource Strain (CARDIA)    Difficulty of Paying Living Expenses: Not hard at all  Food Insecurity: [name] Food Insecurity (10/02/2023)   Hunger Vital Sign    Worried About Running Out of Food in the Last Year: Never true    Ran Out of Food in the Last Year: Never true  Transportation Needs: [name] Transportation Needs (10/02/2023)   PRAPARE - Administrator, Civil Service (Medical): [name]    Lack of Transportation (Non-Medical): [name]  Physical Activity: Sufficiently Active (10/02/2023)   Exercise Vital Sign    Days of Exercise per Week: 7 days    Minutes of Exercise per Session: 30 min  Stress: [name] Stress Concern Present (10/02/2023)   [employer] of Occupational Health - Occupational Stress Questionnaire    Feeling of Stress : Not at all  Social Connections: Socially Isolated (10/02/2023)   Social Connection and Isolation Panel [NHANES]    Frequency of Communication with Friends and Family: More than three times a week    Frequency of Social Gatherings with Friends and Family: More than three times a week    Attends Religious Services: Never    Database administrator or Organizations: [name]    Attends Engineer, structural: Never    Marital Status: Never married    Tobacco Counseling Ready to quit: Not Answered Counseling given: Not Answered    Clinical Intake:  Pre-visit preparation completed: Yes  Pain :  [name]/denies pain     BMI - recorded: 26 Nutritional Status: BMI 25 -29 Overweight Nutritional Risks: None Diabetes: [name]  Lab Results  Component Value Date   HGBA1C 5.5 04/10/2023   HGBA1C 6.0 (H) 04/06/2022   HGBA1C 5.5 08/17/2021     How often do you need to have someone help you when you read instructions, pamphlets, or other written materials from your doctor or pharmacy?: 1 -  Never  Interpreter Needed?: [name]  Information entered by :: Kennedy Bucker, LPN   Activities of Daily Living     10/02/2023    4:05 PM  In your present state of health, do you have any difficulty performing the following activities:  Hearing? 0  Vision? 0  Difficulty concentrating or making decisions? 0  Walking or climbing stairs? 0  Dressing or bathing? 0  Doing errands, shopping? 0  Preparing Food and eating ? N  Using the Toilet? N  In the past six months, have you accidently leaked urine? N  Do you have problems with loss of bowel control? N  Managing your Medications? N  Managing your Finances? N  Housekeeping or managing your Housekeeping? N    Patient Care Team: Reubin Milan, MD as PCP - General (Internal Medicine) Marcina Millard, MD as PCP - Cardiology (Cardiology) Kennedy Bucker, MD as Consulting Physician (Orthopedic Surgery) Wyn Quaker Marlow Baars, MD as Referring Physician (Vascular Surgery) Vida Rigger, MD as Consulting Physician (Pulmonary Disease) Minor, Theadora Rama, RN (Inactive) as [facility] Care Management Rolley Sims, Rolly Pancake, MD as Consulting Physician (Ophthalmology)  Indicate any recent Medical Services you may have received from other than [facility] providers in the past year (date may be approximate).     Assessment:   This is a routine wellness examination for Aaron [name].  Hearing/Vision screen Hearing Screening - Comments:: [name] AIDS Vision Screening - Comments:: WEARS GLASSES ALL THE TIME- [email or web address]   Goals Addressed             This Visit's  Progress    DIET - EAT MORE FRUITS AND VEGETABLES         Depression Screen     10/02/2023    4:03 PM 09/18/2023   10:04 AM 04/10/2023    9:13 AM 01/30/2023    3:40 PM 10/08/2022    1:25 PM 08/13/2022    2:47 PM 08/01/2022    2:06 PM  PHQ 2/9 Scores  PHQ - 2 Score 0 0 0 0 0 0 0  PHQ- 9 Score 0 0 0 0 0 0 0    Fall Risk     10/02/2023    4:05 PM 09/18/2023   10:03 AM 04/10/2023    9:13 AM 01/30/2023    3:39 PM 10/08/2022    1:26 PM  Fall Risk   Falls in the past year? 0 0 0 0 0  Number falls in past yr: 0 0 0 0 0  Injury with Fall? 0 0 0 0 0  Risk for fall due to : [name] Fall Risks [name] Fall Risks [name] Fall Risks [name] Fall Risks [name] Fall Risks  Follow up Falls prevention discussed;Falls evaluation completed Falls evaluation completed Falls evaluation completed Falls evaluation completed Falls evaluation completed    MEDICARE RISK AT HOME:  Medicare Risk at Home Any stairs in or around the home?: Yes If so, are there any without handrails?: [name] Home free of loose throw rugs in walkways, pet beds, electrical cords, etc?: Yes Adequate lighting in your home to reduce risk of falls?: Yes Life alert?: [name] Use of a cane, walker or w/c?: [name] Grab bars in the bathroom?: [name] Shower chair or bench in shower?: [name] Elevated toilet seat or a handicapped toilet?: Yes  TIMED UP AND GO:  Was the test performed?  [name]  Cognitive Function: 6CIT completed        10/02/2023    4:07 PM 05/30/2022    1:22 PM 04/27/2020  11:35 AM 04/27/2019   11:41 AM 10/17/2017   10:54 AM  6CIT Screen  What Year? 0 points 0 points 0 points 0 points 0 points  What month? 0 points 0 points 0 points 0 points 0 points  What time? 0 points 0 points 0 points 0 points 0 points  Count back from 20 0 points 0 points 0 points 0 points 0 points  Months in reverse 0 points 0 points 0 points 0 points 0 points  Repeat phrase 0 points 0 points 0 points 0 points 2 points  Total Score 0 points 0 points 0 points 0 points 2 points     Immunizations Immunization History  Administered Date(s) Administered   Fluad Quad(high Dose 65+) 04/05/2021, 03/21/2022   Fluad Trivalent(High Dose 65+) 04/10/2023   Influenza,inj,Quad PF,6+ Mos 05/19/2015, 04/06/2016, 04/02/2017, 03/24/2018, 04/01/2019, 04/14/2020   PFIZER(Purple Top)SARS-COV-2 Vaccination 08/25/2019, 09/15/2019, 06/13/2020   PNEUMOCOCCAL CONJUGATE-20 04/05/2021   Pneumococcal Polysaccharide-23 11/14/2012, 07/03/2014   Rsv, Bivalent, Protein Subunit Rsvpref,pf Verdis Frederickson) 04/26/2023   Tdap 12/18/2013   Zoster Recombinant(Shingrix) 04/26/2023    Screening Tests Health Maintenance  Topic Date Due   Zoster Vaccines- Shingrix (2 of 2) 06/21/2023   COVID-19 Vaccine (4 - 2024-25 season) 10/04/2023 (Originally 03/10/2023)   DTaP/Tdap/Td (2 - Td or Tdap) 12/19/2023   Medicare Annual Wellness (AWV)  10/01/2024   Colonoscopy  01/17/2025   Pneumonia Vaccine 73+ Years old  Completed   INFLUENZA VACCINE  Completed   Hepatitis C Screening  Completed   HPV VACCINES  Aged Out   Lung Cancer Screening  Discontinued    Health Maintenance  Health Maintenance Due  Topic Date Due   Zoster Vaccines- Shingrix (2 of 2) 06/21/2023   Health Maintenance Items Addressed: UP TO DATE ON COLONOSCOPY, SHOTS  Additional Screening:  Vision Screening: Recommended annual ophthalmology exams for early detection of glaucoma and other disorders of the eye.  Dental Screening: Recommended annual dental exams for proper oral hygiene  Community Resource Referral / Chronic Care Management: CRR required this visit?  [name]   CCM required this visit?  [name]     Plan:     I have personally reviewed and noted the following in the patient's chart:   Medical and social history Use of alcohol, tobacco or illicit drugs  Current medications and supplements including opioid prescriptions. Patient is not currently taking opioid prescriptions. Functional ability and status Nutritional  status Physical activity Advanced directives List of other physicians Hospitalizations, surgeries, and ER visits in previous 12 months Vitals Screenings to include cognitive, depression, and falls Referrals and appointments  In addition, I have reviewed and discussed with patient certain preventive protocols, quality metrics, and best practice recommendations. A written personalized care plan for preventive services as well as general preventive health recommendations were provided to patient.     Hal Hope, LPN   1/61/0960   After Visit Summary: (MyChart) Due to this being a telephonic visit, the after visit summary with patients personalized plan was offered to patient via MyChart   Notes: Nothing significant to report at this time.

## 2023-11-18 ENCOUNTER — Other Ambulatory Visit: Payer: Self-pay | Admitting: Acute Care

## 2023-11-18 DIAGNOSIS — F17210 Nicotine dependence, cigarettes, uncomplicated: Secondary | ICD-10-CM

## 2023-11-18 DIAGNOSIS — Z122 Encounter for screening for malignant neoplasm of respiratory organs: Secondary | ICD-10-CM

## 2023-11-18 DIAGNOSIS — Z87891 Personal history of nicotine dependence: Secondary | ICD-10-CM

## 2023-12-04 ENCOUNTER — Ambulatory Visit: Payer: Self-pay | Admitting: *Deleted

## 2023-12-04 ENCOUNTER — Encounter: Payer: Self-pay | Admitting: Internal Medicine

## 2023-12-04 ENCOUNTER — Ambulatory Visit (INDEPENDENT_AMBULATORY_CARE_PROVIDER_SITE_OTHER): Admitting: Internal Medicine

## 2023-12-04 VITALS — BP 126/72 | HR 75 | Ht 73.0 in | Wt 194.1 lb

## 2023-12-04 DIAGNOSIS — R1909 Other intra-abdominal and pelvic swelling, mass and lump: Secondary | ICD-10-CM

## 2023-12-04 DIAGNOSIS — R140 Abdominal distension (gaseous): Secondary | ICD-10-CM | POA: Diagnosis not present

## 2023-12-04 NOTE — Telephone Encounter (Signed)
 Noted  Pt has a appt.  KP

## 2023-12-04 NOTE — Progress Notes (Signed)
 Date:  12/04/2023   Name:  Aaron [name]   DOB:  Jan 14, 1956   MRN:  [id number]   Chief Complaint: Diarrhea, Bloated, and Mass (Patient said he has a 3in hard mass in his left lower abdominal area, noticed it  3 weeks ago, has not noticed it growing, sometimes abdominal pain but mostly diarrhea )  Abdominal Pain This is a new problem. The current episode started 1 to 4 weeks ago. The problem occurs daily. The problem has been unchanged. Pain location: generalized bloating. The patient is experiencing no pain. The quality of the pain is a sensation of fullness. The abdominal pain does not radiate. Pertinent negatives include no belching, constipation, diarrhea or flatus.  Groin Pain Primary symptoms comment: left groin mass. This is a new problem. The current episode started 1 to 4 weeks ago. The problem occurs daily. The problem has been unchanged. The pain is mild. Associated symptoms include abdominal pain. Pertinent negatives include no chest pain, chills, constipation, diarrhea or shortness of breath.    Review of Systems  Constitutional:  Negative for chills and fatigue.  Respiratory:  Negative for chest tightness and shortness of breath.   Cardiovascular:  Negative for chest pain.  Gastrointestinal:  Positive for abdominal pain. Negative for constipation, diarrhea and flatus.  Genitourinary:  Negative for difficulty urinating.     Lab Results  Component Value Date   NA 136 04/10/2023   K 4.4 04/10/2023   CO2 21 04/10/2023   GLUCOSE 95 04/10/2023   BUN 19 04/10/2023   CREATININE 0.94 04/10/2023   CALCIUM 9.8 04/10/2023   EGFR 89 04/10/2023   GFRNONAA >60 09/17/2022   Lab Results  Component Value Date   CHOL 118 04/10/2023   HDL 31 (L) 04/10/2023   LDLCALC 65 04/10/2023   TRIG 124 04/10/2023   CHOLHDL 3.8 04/10/2023   Lab Results  Component Value Date   TSH 3.214 02/06/2019   Lab Results  Component Value Date   HGBA1C 5.5 04/10/2023   Lab Results  Component  Value Date   WBC 8.6 04/10/2023   HGB 16.7 04/10/2023   HCT 47.7 04/10/2023   MCV 99 (H) 04/10/2023   PLT 203 04/10/2023   Lab Results  Component Value Date   ALT 33 04/10/2023   AST 26 04/10/2023   ALKPHOS 68 04/10/2023   BILITOT 0.3 04/10/2023   No results found for: "25OHVITD2", "25OHVITD3", "VD25OH"   Patient Active Problem List   Diagnosis Date Noted   BPH with lower urinary tract symptoms without urinary obstruction 04/10/2023   Atherosclerosis of artery of both lower extremities (HCC) 04/11/2022   Hepatic steatosis 01/04/2022   Aortic atherosclerosis (HCC) 07/10/2020   Stage 2 moderate COPD by GOLD classification (HCC) 05/16/2020   Dyspepsia and disorder of function of stomach 05/16/2020   Cervical disc disorder of mid-cervical region 05/10/2020   Polyp of descending colon    Mixed hyperlipidemia 10/28/2018   Atherosclerosis of native arteries of extremity with intermittent claudication (HCC) 07/13/2016   Tobacco use disorder 07/13/2016   Prediabetes 07/27/2015   Controlled gout 12/22/2014   Coronary artery disease involving native coronary artery of native heart with angina pectoris (HCC) 10/09/2014   Essential (primary) hypertension 10/09/2014   Palmar fascial fibromatosis (dupuytren) 10/09/2014   Psoriasis 10/09/2014   H/O cardiac catheterization 01/27/2014   Peripheral vascular disease (HCC) 01/27/2014    Allergies  Allergen Reactions   Contrast Media [Iodinated Contrast Media] Hives   Iodine  Hives  Past Surgical History:  Procedure Laterality Date   APPENDECTOMY     CATARACT EXTRACTION W/PHACO Right 10/26/2021   Procedure: CATARACT EXTRACTION PHACO AND INTRAOCULAR LENS PLACEMENT (IOC) RIGHT OMIDRIA   10.98 01:18.2;  Surgeon: Trudi Fus, MD;  Location: [facility];  Service: Ophthalmology;  Laterality: Right;   CATARACT EXTRACTION W/PHACO Left 11/16/2021   Procedure: CATARACT EXTRACTION PHACO AND INTRAOCULAR LENS PLACEMENT (IOC) LEFT;   Surgeon: Trudi Fus, MD;  Location: [facility];  Service: Ophthalmology;  Laterality: Left;  6.91 0:46.0   COLONOSCOPY  03/30/2013   [place]   COLONOSCOPY WITH PROPOFOL  N/A 12/10/2019   Procedure: COLONOSCOPY WITH PROPOFOL ;  Surgeon: Marnee Sink, MD;  Location: [facility];  Service: Endoscopy;  Laterality: N/A;   COLONOSCOPY WITH PROPOFOL  N/A 01/18/2020   Procedure: COLONOSCOPY WITH PROPOFOL ;  Surgeon: Marnee Sink, MD;  Location: [facility];  Service: Endoscopy;  Laterality: N/A;  4   CORONARY ANGIOPLASTY WITH STENT PLACEMENT Left    Procedure: CORONARY ANGIOPLASTY WITH STENT PLACEMENT   ENDARTERECTOMY FEMORAL Left 04/11/2022   Procedure: ENDARTERECTOMY FEMORAL ( SFA & TIBIAL); insertion of SFA stent;  Surgeon: Celso College, MD;  Location: [facility] ORS;  Service: Vascular;  Laterality: Left;   LEFT HEART CATH AND CORONARY ANGIOGRAPHY N/A 12/29/2020   Procedure: LEFT HEART CATH AND CORONARY ANGIOGRAPHY;  Surgeon: Michelle Aid, MD;  Location: [facility] INVASIVE CV LAB;  Service: Cardiovascular;  Laterality: N/A;   LEFT HEART CATH AND CORONARY ANGIOGRAPHY Left 06/19/2010   Procedure: LEFT HEART CATH AND CORONARY ANGIOGRAPHY; Location: [facility]; Surgeon: Rinda Cheers, MD   LEFT HEART CATH AND CORONARY ANGIOGRAPHY Left 07/08/2014   Procedure: LEFT HEART CATH AND CORONARY ANGIOGRAPHY; Location: [facility]; Surgeon: Percival Brace, MD   LOWER EXTREMITY ANGIOGRAM Left 04/11/2022   Procedure: LOWER EXTREMITY ANGIOGRAM;  Surgeon: Celso College, MD;  Location: [facility] ORS;  Service: Vascular;  Laterality: Left;   LOWER EXTREMITY ANGIOGRAPHY Right 09/25/2018   Procedure: LOWER EXTREMITY ANGIOGRAPHY;  Surgeon: Celso College, MD;  Location: [facility] INVAS[facility] CV LAB;  Service: Cardiovascular;  Laterality: Right;   LOWER EXTREMITY ANGIOGRAPHY Left 03/17/2020   Procedure: LOWER EXTREMITY ANGIOGRAPHY;  Surgeon: Celso College, MD;  Location: [facility] INV[facility] CV LAB;  Service: Cardiovascular;   Laterality: Left;   LOWER EXTREMITY ANGIOGRAPHY Right 02/22/2022   Procedure: Lower Extremity Angiography;  Surgeon: Celso College, MD;  Location: [facility] INVAS[facility] CV LAB;  Service: Cardiovascular;  Laterality: Right;   POLYPECTOMY N/A 01/18/2020   Procedure: POLYPECTOMY;  Surgeon: Marnee Sink, MD;  Location: [facility];  Service: Endoscopy;  Laterality: N/A;    Social History   Tobacco Use   Smoking status: Every Day    Types: Cigars   Smokeless tobacco: Never  Vaping Use   Vaping status: Never Used  Substance Use Topics   Alcohol use: Yes    Comment: quit drinking around 04/2019(May have drink 1x/mo)   Drug use: No     Medication list has been reviewed and updated.  Current Meds  Medication Sig   albuterol  (VENTOLIN  HFA) 108 (90 Base) MCG/ACT inhaler Inhale 2 puffs into the lungs every 6 (six) hours as needed for wheezing or shortness of breath.   aspirin  325 MG tablet Take 81 mg by mouth daily.   betamethasone  valerate (VALISONE ) 0.1 % cream Apply topically 2 (two) times daily. To rash on buttocks   cilostazol  (PLETAL ) 50 MG tablet Take 1 tablet (50 mg total) by mouth 2 (two) times daily.  clopidogrel  (PLAVIX ) 75 MG tablet Take 1 tablet by mouth daily.   indomethacin  (INDOCIN ) 25 MG capsule Take 1 capsule (25 mg total) by mouth 2 (two) times daily as needed.   isosorbide  mononitrate (IMDUR ) 120 MG 24 hr tablet Take 1 tablet (120 mg total) by mouth daily.   lisinopril  (PRINIVIL ,ZESTRIL ) 10 MG tablet Take 1 tablet by mouth daily.   metoprolol  tartrate (LOPRESSOR ) 50 MG tablet Take 50-75 mg by mouth 2 (two) times a day. Take 50MG  by mouth every morning and 75MG  every evening   nitroGLYCERIN  (NITROSTAT ) 0.4 MG SL tablet Place 1 tablet under the tongue as needed.   simvastatin  (ZOCOR ) 40 MG tablet Take 1 tablet (40 mg total) by mouth at bedtime.       12/04/2023    1:42 PM 09/18/2023   10:04 AM 04/10/2023    9:13 AM 01/30/2023    3:40 PM  GAD 7 : Generalized Anxiety  Score  Nervous, Anxious, on Edge 0 0 0 0  Control/stop worrying 0 0 0 0  Worry too much - different things 0 0 0 0  Trouble relaxing 0 0 0 0  Restless 0 0 0 0  Easily annoyed or irritable 0 0 0 0  Afraid - awful might happen 0 0 0 0  Total GAD 7 Score 0 0 0 0  Anxiety Difficulty Not difficult at all Not difficult at all Not difficult at all Not difficult at all       12/04/2023    1:42 PM 10/02/2023    4:03 PM 09/18/2023   10:04 AM  Depression screen PHQ 2/9  Decreased Interest 0 0 0  Down, Depressed, Hopeless 0 0 0  PHQ - 2 Score 0 0 0  Altered sleeping 0 0 0  Tired, decreased energy 0 0 0  Change in appetite 0 0 0  Feeling bad or failure about yourself  0 0 0  Trouble concentrating 0 0 0  Moving slowly or fidgety/restless 0 0 0  Suicidal thoughts 0 0 0  PHQ-9 Score 0 0 0  Difficult doing work/chores Not difficult at all Not difficult at all Not difficult at all    BP Readings from Last 3 Encounters:  12/04/23 126/72  09/18/23 114/68  08/13/23 117/63    Physical Exam Vitals and nursing note reviewed.  Constitutional:      General: He is not in acute distress.    Appearance: Normal appearance. He is well-developed.  HENT:     Head: Normocephalic and atraumatic.  Cardiovascular:     Rate and Rhythm: Normal rate and regular rhythm.  Pulmonary:     Effort: Pulmonary effort is normal. No respiratory distress.     Breath sounds: No wheezing or rhonchi.  Abdominal:     General: There is no distension.     Palpations: Abdomen is soft. There is no mass.     Tenderness: There is no abdominal tenderness. There is no guarding or rebound.     Hernia: A hernia is present. Hernia is present in the left inguinal area.  Genitourinary:   Skin:    General: Skin is warm and dry.     Findings: No rash.  Neurological:     Mental Status: He is alert and oriented to person, place, and time.  Psychiatric:        Mood and Affect: Mood normal.        Behavior: Behavior normal.      Wt Readings from Last 3 Encounters:  12/04/23 194 lb 2 oz (88.1 kg)  09/18/23 197 lb 2 oz (89.4 kg)  08/13/23 193 lb 6.4 oz (87.7 kg)    BP 126/72   Pulse 75   Ht 6\' 1"  (1.854 m)   Wt 194 lb 2 oz (88.1 kg)   SpO2 96%   BMI 25.61 kg/m   Assessment and Plan:  Problem List Items Addressed This Visit   None Visit Diagnoses       Abdominal bloating    -  Primary   recommend a Probiotic daily and avoid fiber supplements if no improvement, return for recheck     Mass of left inguinal region       refer to Gen Surgery   Relevant Orders   Ambulatory referral to General Surgery       No follow-ups on file.    Sheron Dixons, MD [facility] [place]

## 2023-12-04 NOTE — Telephone Encounter (Signed)
 Copied from CRM [id number]. Topic: Clinical - Red Word Triage >> Dec 04, 2023  8:11 AM Aaron [name] wrote: Red Word that prompted transfer to Nurse Triage: Bloated in stomach and diarrhea. Reason for Disposition  Abdominal pain  (Exception: Pain clears with each passage of diarrhea stool.)  Answer Assessment - Initial Assessment Questions 1. DIARRHEA SEVERITY: "How bad is the diarrhea?" "How many more stools have you had in the past 24 hours than normal?"    - NO DIARRHEA (SCALE 0)   - MILD (SCALE 1-3): Few loose or mushy BMs; increase of 1-3 stools over normal daily number of stools; mild increase in ostomy output.   -  MODERATE (SCALE 4-7): Increase of 4-6 stools daily over normal; moderate increase in ostomy output.   -  SEVERE (SCALE 8-10; OR "WORST POSSIBLE"): Increase of 7 or more stools daily over normal; moderate increase in ostomy output; incontinence.     I'm having diarrhea for 3 weeks now.   On left side there is a knot that is hard kinda like my whole stomach is.   It's around and left to the groin in area in crease.     2. ONSET: "When did the diarrhea begin?"      Diarrhea started 3 weeks ago intermittently 3. BM CONSISTENCY: "How loose or watery is the diarrhea?"      Watery No antibiotics. 4. VOMITING: "Are you also vomiting?" If Yes, ask: "How many times in the past 24 hours?"      No vomiting or nausea 5. ABDOMEN PAIN: "Are you having any abdomen pain?" If Yes, ask: "What does it feel like?" (e.g., crampy, dull, intermittent, constant)      Once in a while it hurts before I go to the bathroom. 6. ABDOMEN PAIN SEVERITY: If present, ask: "How bad is the pain?"  (e.g., Scale 1-10; mild, moderate, or severe)   - MILD (1-3): doesn't interfere with normal activities, abdomen soft and not tender to touch    - MODERATE (4-7): interferes with normal activities or awakens from sleep, abdomen tender to touch    - SEVERE (8-10): excruciating pain, doubled over, unable to do any normal  activities       Intermittently before having diarrhea 7. ORAL INTAKE: If vomiting, "Have you been able to drink liquids?" "How much liquids have you had in the past 24 hours?"     Yes 8. HYDRATION: "Any signs of dehydration?" (e.g., dry mouth [not just dry lips], too weak to stand, dizziness, new weight loss) "When did you last urinate?"     No I drink a lot of water .   9. EXPOSURE: "Have you traveled to a foreign country recently?" "Have you been exposed to anyone with diarrhea?" "Could you have eaten any food that was spoiled?"     Not asked 10. ANTIBIOTIC USE: "Are you taking antibiotics now or have you taken antibiotics in the past 2 months?"       No 11. OTHER SYMPTOMS: "Do you have any other symptoms?" (e.g., fever, blood in stool)       See above  12. PREGNANCY: "Is there any chance you are pregnant?" "When was your last menstrual period?"       N/A  Protocols used: [facility]  Chief Complaint: In left lower groin area in the crease there is a knot.  My whole stomach feels bloated.   This has been going on for 3 wks Symptoms: stomach bloated, pain before having diarrhea that resolves after  going to the bathroom.   Having watery diarrhea intermittently for the last 3 wks.   Frequency: Last 3 wks Pertinent Negatives: Patient denies being on antibiotics, no vomiting or nausea Disposition: [] ED /[] Urgent Care (no appt availability in office) / [x] Appointment(In office/virtual)/ []  [place] [facility]/ [] Home Care/ [] Refused Recommended Disposition /[] [place] [facility]/ []  Follow-up with PCP Additional Notes: Appt made for today with Dr. Gala Jubilee at 1:40.

## 2023-12-06 ENCOUNTER — Other Ambulatory Visit (INDEPENDENT_AMBULATORY_CARE_PROVIDER_SITE_OTHER): Payer: Self-pay | Admitting: Vascular Surgery

## 2023-12-10 ENCOUNTER — Ambulatory Visit: Admitting: General Surgery

## 2023-12-17 ENCOUNTER — Ambulatory Visit: Admitting: General Surgery

## 2023-12-17 ENCOUNTER — Ambulatory Visit: Payer: Self-pay | Admitting: General Surgery

## 2023-12-17 ENCOUNTER — Encounter: Payer: Self-pay | Admitting: General Surgery

## 2023-12-17 VITALS — BP 114/69 | HR 68 | Ht 73.0 in | Wt 189.0 lb

## 2023-12-17 DIAGNOSIS — K4090 Unilateral inguinal hernia, without obstruction or gangrene, not specified as recurrent: Secondary | ICD-10-CM | POA: Diagnosis not present

## 2023-12-17 NOTE — Progress Notes (Signed)
 Patient ID: Aaron [name], male   DOB: 12-10-1955, 68 y.o.   MRN: [id number] CC: Left Inguinal Hernia History of Present Illness Aaron [name] is a 68 y.o. male with past medical history significant for coronary artery disease, peripheral vascular disease and smoker who present in consultation for left inguinal hernia.  The patient reports that he has noticed a bulge in his left groin.  He noticed this over the last several weeks.  He says that there are no overlying skin changes or obstipation.  The blood is able to get he denies any previous inguinal surgical history on the left side.  He denies any problems with the right side.  He is on aspirin , Plavix  and Pletal ..  Past Medical History Past Medical History:  Diagnosis Date   Aortic atherosclerosis (HCC)    COPD (chronic obstructive pulmonary disease) (HCC)    Coronary artery disease 06/19/2010   a.) LHC/PCI (unknown date):--> stent to pRCA and pLAD (unknown type); b.) LHC 06/19/2010: 30 mLM, 60 mLAD, 60 pLCx, 50 pRI, 40 ISR pRCA, 50 dRCA, 50 RPDA, 50 RPLS --> med mgmt; c.) LHC 07/08/2014: 10 pLM, 30 dLAD, 75 oLCx, 30 mRCA, 40 RPDA, patent RCA stent --> med mgmt; d.) LHC 12/28/2021: 50 mRCA, 65 RPDA, 45 m-dRCA, 60 o-pRCA, 95 o-pLCx, 60 RI, 30 pLAD, 30 mLM --> med mgmt.   CVA (cerebral vascular accident) [facility])    a.) noted on brain MRI 09/17/2022 - > small remote infarct in the left middle cerebellar peduncle --> date of initial event unknown   DDD (degenerative disc disease), cervical    Diastolic dysfunction 09/16/2017   Fracture of left ankle, closed, initial encounter 10/23/2017   GERD (gastroesophageal reflux disease)    Glaucoma    Gout    Hepatic steatosis    Hypercholesteremia    Hypertension    Left inguinal hernia    Long term current use of antithrombotics/antiplatelets    a.) cilostazol  + low dose ASA + clopidogrel    PAD (peripheral artery disease) (HCC)    a.) s/p multiple PTA procedures to lower extremities   Status  post bilateral cataract extraction    Syncope 09/16/2017   Unstable angina (HCC) 01/12/2017   Vertigo        Past Surgical History:  Procedure Laterality Date   APPENDECTOMY     CATARACT EXTRACTION W/PHACO Right 10/26/2021   Procedure: CATARACT EXTRACTION PHACO AND INTRAOCULAR LENS PLACEMENT (IOC) RIGHT OMIDRIA   10.98 01:18.2;  Surgeon: Enola Feliciano Hugger, MD;  Location: [facility];  Service: Ophthalmology;  Laterality: Right;   CATARACT EXTRACTION W/PHACO Left 11/16/2021   Procedure: CATARACT EXTRACTION PHACO AND INTRAOCULAR LENS PLACEMENT (IOC) LEFT;  Surgeon: Enola Feliciano Hugger, MD;  Location: [facility];  Service: Ophthalmology;  Laterality: Left;  6.91 0:46.0   COLONOSCOPY  03/30/2013   [place]   COLONOSCOPY WITH PROPOFOL  N/A 12/10/2019   Procedure: COLONOSCOPY WITH PROPOFOL ;  Surgeon: Jinny Carmine, MD;  Location: [facility];  Service: Endoscopy;  Laterality: N/A;   COLONOSCOPY WITH PROPOFOL  N/A 01/18/2020   Procedure: COLONOSCOPY WITH PROPOFOL ;  Surgeon: Jinny Carmine, MD;  Location: [facility];  Service: Endoscopy;  Laterality: N/A;  4   CORONARY ANGIOPLASTY WITH STENT PLACEMENT Left    Procedure: CORONARY ANGIOPLASTY WITH STENT PLACEMENT   ENDARTERECTOMY FEMORAL Left 04/11/2022   Procedure: ENDARTERECTOMY FEMORAL ( SFA & TIBIAL); insertion of SFA stent;  Surgeon: Marea Selinda RAMAN, MD;  Location: [facility] ORS;  Service: Vascular;  Laterality: Left;  LEFT HEART CATH AND CORONARY ANGIOGRAPHY N/A 12/29/2020   Procedure: LEFT HEART CATH AND CORONARY ANGIOGRAPHY;  Surgeon: Hester Wolm PARAS, MD;  Location: [facility] LAB;  Service: Cardiovascular;  Laterality: N/A;   LEFT HEART CATH AND CORONARY ANGIOGRAPHY Left 06/19/2010   Procedure: LEFT HEART CATH AND CORONARY ANGIOGRAPHY; Location: [facility]; Surgeon: Vita Bathe, MD   LEFT HEART CATH AND CORONARY ANGIOGRAPHY Left 07/08/2014   Procedure: LEFT HEART CATH AND CORONARY ANGIOGRAPHY; Location:  [facility]; Surgeon: Marsa Dooms, MD   LOWER EXTREMITY ANGIOGRAM Left 04/11/2022   Procedure: LOWER EXTREMITY ANGIOGRAM;  Surgeon: Marea Selinda RAMAN, MD;  Location: [facility];  Service: Vascular;  Laterality: Left;   LOWER EXTREMITY ANGIOGRAPHY Right 09/25/2018   Procedure: LOWER EXTREMITY ANGIOGRAPHY;  Surgeon: Marea Selinda RAMAN, MD;  Location: [facility];  Service: Cardiovascular;  Laterality: Right;   LOWER EXTREMITY ANGIOGRAPHY Left 03/17/2020   Procedure: LOWER EXTREMITY ANGIOGRAPHY;  Surgeon: Marea Selinda RAMAN, MD;  Location: [facility];  Service: Cardiovascular;  Laterality: Left;   LOWER EXTREMITY ANGIOGRAPHY Right 02/22/2022   Procedure: Lower Extremity Angiography;  Surgeon: Marea Selinda RAMAN, MD;  Location: [facility];  Service: Cardiovascular;  Laterality: Right;   POLYPECTOMY N/A 01/18/2020   Procedure: POLYPECTOMY;  Surgeon: Jinny Carmine, MD;  Location: [facility];  Service: Endoscopy;  Laterality: N/A;    Allergies  Allergen Reactions   Contrast Media [Iodinated Contrast Media] Hives   Iodine  Hives    Current Outpatient Medications  Medication Sig Dispense Refill   albuterol  (VENTOLIN  HFA) 108 (90 Base) MCG/ACT inhaler Inhale 2 puffs into the lungs every 6 (six) hours as needed for wheezing or shortness of breath. 6.7 g 5   aspirin  EC 81 MG tablet Take 81 mg by mouth daily. Swallow whole.     betamethasone  valerate (VALISONE ) 0.1 % cream Apply topically 2 (two) times daily. To rash on buttocks (Patient taking differently: Apply 1 Application topically 2 (two) times daily as needed (rash).) 45 g 1   cilostazol  (PLETAL ) 50 MG tablet TAKE 1 TABLET BY MOUTH TWICE  DAILY 200 tablet 2   clopidogrel  (PLAVIX ) 75 MG tablet Take 1 tablet by mouth daily.     indomethacin  (INDOCIN ) 25 MG capsule Take 1 capsule (25 mg total) by mouth 2 (two) times daily as needed. 30 capsule 1   isosorbide  mononitrate (IMDUR ) 120 MG 24 hr tablet Take 1 tablet (120 mg total) by mouth  daily. 30 tablet 0   lisinopril  (PRINIVIL ,ZESTRIL ) 10 MG tablet Take 10 mg by mouth daily.     metoprolol  tartrate (LOPRESSOR ) 50 MG tablet Take 50-75 mg by mouth See admin instructions. Take 50 mg by mouth every morning and 75 mg every evening     nitroGLYCERIN  (NITROSTAT ) 0.4 MG SL tablet Place 0.4 mg under the tongue every 5 (five) minutes as needed for chest pain.     simvastatin  (ZOCOR ) 40 MG tablet Take 1 tablet (40 mg total) by mouth at bedtime. 30 tablet 5   No current facility-administered medications for this visit.    Family History Family History  Problem Relation Age of Onset   Hypertension Mother    Diabetes Mother    Heart failure Father        age 52   Cancer Father    Ovarian cancer Sister    Heart disease Brother        Social History Social History   Tobacco Use   Smoking status: Every Day  Types: Cigars    Passive exposure: Past   Smokeless tobacco: Never  Vaping Use   Vaping status: Never Used  Substance Use Topics   Alcohol use: Yes    Comment: quit drinking around 04/2019(May have drink 1x/mo)   Drug use: No        ROS Full ROS of systems performed and is otherwise negative there than what is stated in the HPI  Physical Exam Blood pressure 114/69, pulse 68, height 6' 1 (1.854 m), weight 189 lb (85.7 kg), SpO2 95%.  Alert and oriented x 3, normal work of breathing on room air, regular rate and rhythm, abdomen is soft, nontender and nondistended.  Left groin there is a obvious bulge that I am able to reduce easily.  There is a left groin incision that is well-healed.  Data Reviewed Labs and imaging reviewed.  I also reviewed his notes from the pulmonologist, as well as his cardiologist and vascular surgeon  I have personally reviewed the patient's imaging and medical records.    Assessment    Patient with reducible left inguinal hernia.  He would like this repaired.  Given that he has a scar over his left groin I would like to try to  perform this minimally invasive.  We will have him undergo cardiac restriction as you are that he can hold his Plavix  and Pletal  7 days prior to the procedure.  I discussed the risk, benefits alternatives to the procedure including risk of infection, bleeding which she is at increased risk for, recurrence, chronic pain, damage to the vas deferens and testicular ischemia. He wishes to proceed with surgery.  A total of 45 minutes was spent reviewing the patient's chart, performing history and physical and discussing treatment options with the patient    Aaron [name]

## 2023-12-17 NOTE — Patient Instructions (Addendum)
 You have chose to have your hernia repaired. This will be done by Dr. Cornel Diesel at [facility].  You will need to stop your Plavix  and Cilostazol  one week prior to surgery. We will get clearance from your Cardiologist and Vascular providers about this.   Please see your (blue) Pre-care information that you have been given today. Our surgery scheduler will call you to verify surgery date and to go over information.   You will need to arrange to be out of work for approximately 1-2 weeks and then you may return with a lifting restriction for 4 more weeks. If you have FMLA or Disability paperwork that needs to be filled out, please have your company fax your paperwork to [phone] or you may drop this by either office. This paperwork will be filled out within 3 days after your surgery has been completed.  You may have a bruise in your groin and also swelling and brusing in your testicle area. You may use ice 4-5 times daily for 15-20 minutes each time. Make sure that you place a barrier between you and the ice pack. To decrease the swelling, you may roll up a bath towel and place it vertically in between your thighs with your testicles resting on the towel. You will want to keep this area elevated as much as possible for several days following surgery.    Inguinal Hernia, Adult Muscles help keep everything in the body in its proper place. But if a weak spot in the muscles develops, something can poke through. That is called a hernia. When this happens in the lower part of the belly (abdomen), it is called an inguinal hernia. (It takes its name from a part of the body in this region called the inguinal canal.) A weak spot in the wall of muscles lets some fat or part of the small intestine bulge through. An inguinal hernia can develop at any age. Men get them more often than women. CAUSES  In adults, an inguinal hernia develops over time. It can be triggered by: Suddenly straining the muscles of the  lower abdomen. Lifting heavy objects. Straining to have a bowel movement. Difficult bowel movements (constipation) can lead to this. Constant coughing. This may be caused by smoking or lung disease. Being overweight. Being pregnant. Working at a job that requires long periods of standing or heavy lifting. Having had an inguinal hernia before. One type can be an emergency situation. It is called a strangulated inguinal hernia. It develops if part of the small intestine slips through the weak spot and cannot get back into the abdomen. The blood supply can be cut off. If that happens, part of the intestine may die. This situation requires emergency surgery. SYMPTOMS  Often, a small inguinal hernia has no symptoms. It is found when a healthcare provider does a physical exam. Larger hernias usually have symptoms.  In adults, symptoms may include: A lump in the groin. This is easier to see when the person is standing. It might disappear when lying down. In men, a lump in the scrotum. Pain or burning in the groin. This occurs especially when lifting, straining or coughing. A dull ache or feeling of pressure in the groin. Signs of a strangulated hernia can include: A bulge in the groin that becomes very painful and tender to the touch. A bulge that turns red or purple. Fever, nausea and vomiting. Inability to have a bowel movement or to pass gas. DIAGNOSIS  To decide if you  have an inguinal hernia, a healthcare provider will probably do a physical examination. This will include asking questions about any symptoms you have noticed. The healthcare provider might feel the groin area and ask you to cough. If an inguinal hernia is felt, the healthcare provider may try to slide it back into the abdomen. Usually no other tests are needed. TREATMENT  Treatments can vary. The size of the hernia makes a difference. Options include: Watchful waiting. This is often suggested if the hernia is small and you  have had no symptoms. No medical procedure will be done unless symptoms develop. You will need to watch closely for symptoms. If any occur, contact your healthcare provider right away. Surgery. This is used if the hernia is larger or you have symptoms. Open surgery. This is usually an outpatient procedure (you will not stay overnight in a hospital). An cut (incision) is made through the skin in the groin. The hernia is put back inside the abdomen. The weak area in the muscles is then repaired by herniorrhaphy or hernioplasty. Herniorrhaphy: in this type of surgery, the weak muscles are sewn back together. Hernioplasty: a patch or mesh is used to close the weak area in the abdominal wall. Laparoscopy. In this procedure, a surgeon makes small incisions. A thin tube with a tiny video camera (called a laparoscope) is put into the abdomen. The surgeon repairs the hernia with mesh by looking with the video camera and using two long instruments. HOME CARE INSTRUCTIONS  After surgery to repair an inguinal hernia: You will need to take pain medicine prescribed by your healthcare provider. Follow all directions carefully. You will need to take care of the wound from the incision. Your activity will be restricted for awhile. This will probably include no heavy lifting for several weeks. You also should not do anything too active for a few weeks. When you can return to work will depend on the type of job that you have. During "watchful waiting" periods, you should: Maintain a healthy weight. Eat a diet high in fiber (fruits, vegetables and whole grains). Drink plenty of fluids to avoid constipation. This means drinking enough water  and other liquids to keep your urine clear or pale yellow. Do not lift heavy objects. Do not stand for long periods of time. Quit smoking. This should keep you from developing a frequent cough. SEEK MEDICAL CARE IF:  A bulge develops in your groin area. You feel pain, a burning  sensation or pressure in the groin. This might be worse if you are lifting or straining. You develop a fever of more than 100.5 F (38.1 C). SEEK IMMEDIATE MEDICAL CARE IF:  Pain in the groin increases suddenly. A bulge in the groin gets bigger suddenly and does not go down. For men, there is sudden pain in the scrotum. Or, the size of the scrotum increases. A bulge in the groin area becomes red or purple and is painful to touch. You have nausea or vomiting that does not go away. You feel your heart beating much faster than normal. You cannot have a bowel movement or pass gas. You develop a fever of more than 102.0 F (38.9 C).   This information is not intended to replace advice given to you by your health care provider. Make sure you discuss any questions you have with your health care provider.   Document Released: 11/11/2008 Document Revised: 09/17/2011 Document Reviewed: 12/27/2014 Elsevier Interactive Patient Education [employer]

## 2023-12-17 NOTE — Progress Notes (Signed)
 Request for Cardiology Clearance and to stop Plavix  one week prior to surgery has been faxed to Dr Parks Bollman at [facility].

## 2023-12-17 NOTE — Progress Notes (Signed)
 Request for Vascular clearance and to stop Cilostazol  one week prior to surgery.

## 2023-12-18 ENCOUNTER — Telehealth: Payer: Self-pay | Admitting: General Surgery

## 2023-12-18 NOTE — Telephone Encounter (Signed)
 Patient has been advised of Pre-Admission date/time, and Surgery date at [facility].  Surgery Date: 01/08/24 Preadmission Testing Date: 12/31/23 (phone 8a-1p)  Patient informed of the scheduling process and surgery information given at time of office visit.  Patient has been made aware to call [phone], between 1-3:00pm the day before surgery, to find out what time to arrive for surgery.

## 2023-12-24 NOTE — Progress Notes (Unsigned)
 Cardiology clearance has been received. The patient is cleared at Low risk for surgery. He may hold his Plavix  for 7 days prior to surgery.

## 2023-12-25 NOTE — Progress Notes (Unsigned)
 Vascular clearance has been received from Dr Vonna Guardian. The patient is cleared at Low risk for surgery. He is ok to stop his Cilostazol  one week prior to surgery.

## 2023-12-31 ENCOUNTER — Other Ambulatory Visit: Payer: Self-pay

## 2023-12-31 ENCOUNTER — Encounter
Admission: RE | Admit: 2023-12-31 | Discharge: 2023-12-31 | Disposition: A | Discharge status: Home / Self Care | Source: Ambulatory Visit | Attending: General Surgery | Admitting: General Surgery

## 2023-12-31 VITALS — Ht 73.0 in | Wt 195.0 lb

## 2023-12-31 DIAGNOSIS — R7303 Prediabetes: Secondary | ICD-10-CM

## 2023-12-31 DIAGNOSIS — I70203 Unspecified atherosclerosis of native arteries of extremities, bilateral legs: Secondary | ICD-10-CM

## 2023-12-31 DIAGNOSIS — I700 Atherosclerosis of aorta: Secondary | ICD-10-CM

## 2023-12-31 DIAGNOSIS — I10 Essential (primary) hypertension: Secondary | ICD-10-CM

## 2023-12-31 DIAGNOSIS — I25119 Atherosclerotic heart disease of native coronary artery with unspecified angina pectoris: Secondary | ICD-10-CM

## 2023-12-31 DIAGNOSIS — F17200 Nicotine dependence, unspecified, uncomplicated: Secondary | ICD-10-CM

## 2023-12-31 NOTE — Patient Instructions (Addendum)
 Your procedure is scheduled on: Wednesday 01/08/24 Report to the Registration Desk on the 1st floor of the [facility]. To find out your arrival time, please call [phone] between 1PM - 3PM on: Tuesday 01/09/24 If your arrival time is 6:00 am, do not arrive before that time as the [facility] entrance doors do not open until 6:00 am.  REMEMBER: Instructions that are not followed completely may result in serious medical risk, up to and including death; or upon the discretion of your surgeon and anesthesiologist your surgery may need to be rescheduled.  Do not eat food or drink any liquids after midnight the night before surgery.  No gum chewing or hard candies.  One week prior to surgery: Stop Anti-inflammatories (NSAIDS) such as Advil, Aleve, Ibuprofen, Motrin, Naproxen, Naprosyn and Aspirin  based products such as Excedrin, Goody's Powder, BC Powder. Stop ANY OVER THE COUNTER supplements and vitamins until after surgery.  You may however, continue to take Tylenol  if needed for pain up until the day of surgery.  **Follow recommendations regarding stopping blood thinners.** Last dose of Plavix  and Pletal  will be today 12/31/23  Continue taking all of your other prescription medications up until the day of surgery.  ON THE DAY OF SURGERY ONLY TAKE THESE MEDICATIONS WITH SIPS OF WATER :  isosorbide  mononitrate (IMDUR ) 120 MG 24 hr tablet  metoprolol  tartrate (LOPRESSOR ) 50 MG tablet   Use inhalers on the day of surgery and bring to the hospital.  No Alcohol for 24 hours before or after surgery.  No Smoking including e-cigarettes for 24 hours before surgery.  No chewable tobacco products for at least 6 hours before surgery.  No nicotine patches on the day of surgery.  Do not use any recreational drugs for at least a week (preferably 2 weeks) before your surgery.  Please be advised that the combination of cocaine and anesthesia may have negative outcomes, up to and including  death. If you test positive for cocaine, your surgery will be cancelled.  On the morning of surgery brush your teeth with toothpaste and water , you may rinse your mouth with mouthwash if you wish. Do not swallow any toothpaste or mouthwash.  Use CHG Soap or wipes as directed on instruction sheet.   Do not wear jewelry, make-up, hairpins, clips or nail polish.  For welded (permanent) jewelry: bracelets, anklets, waist bands, etc.  Please have this removed prior to surgery.  If it is not removed, there is a chance that hospital personnel will need to cut it off on the day of surgery.  Do not wear lotions, powders, or perfumes.   Do not shave body hair from the neck down 48 hours before surgery.  Contact lenses, hearing aids and dentures may not be worn into surgery.  Do not bring valuables to the hospital. [facility] is not responsible for any missing/lost belongings or valuables.   Notify your doctor if there is any change in your medical condition (cold, fever, infection).  Wear comfortable clothing (specific to your surgery type) to the hospital.  After surgery, you can help prevent lung complications by doing breathing exercises.  Take deep breaths and cough every 1-2 hours. Your doctor may order a device called an Incentive Spirometer to help you take deep breaths. When coughing or sneezing, hold a pillow firmly against your incision with both hands. This is called "splinting." Doing this helps protect your incision. It also decreases belly discomfort.  If you are being discharged the day of surgery, you  will not be allowed to drive home. You will need a responsible individual to drive you home and stay with you for 24 hours after surgery.   If you are taking public transportation, you will need to have a responsible individual with you.  Please call the Pre-admissions Testing Dept. at [phone] if you have any questions about these instructions.  Surgery Visitation  Policy:  Patients having surgery or a procedure may have two visitors.  Children under the age of 66 must have an adult with them who is not the patient.   Merchandiser, retail to address health-related social needs:  [email or web address]     Preparing for Surgery with CHLORHEXIDINE  GLUCONATE (CHG) Soap  Chlorhexidine  Gluconate (CHG) Soap  o An antiseptic cleaner that kills germs and bonds with the skin to continue killing germs even after washing  o Used for showering the night before surgery and morning of surgery  Before surgery, you can play an important role by reducing the number of germs on your skin.  CHG (Chlorhexidine  gluconate) soap is an antiseptic cleanser which kills germs and bonds with the skin to continue killing germs even after washing.  Please do not use if you have an allergy to CHG or antibacterial soaps. If your skin becomes reddened/irritated stop using the CHG.  1. Shower the NIGHT BEFORE SURGERY and the MORNING OF SURGERY with CHG soap.  2. If you choose to wash your hair, wash your hair first as usual with your normal shampoo.  3. After shampooing, rinse your hair and body thoroughly to remove the shampoo.  4. Use CHG as you would any other liquid soap. You can apply CHG directly to the skin and wash gently with a scrungie or a clean washcloth.  5. Apply the CHG soap to your body only from the neck down. Do not use on open wounds or open sores. Avoid contact with your eyes, ears, mouth, and genitals (private parts). Wash face and genitals (private parts) with your normal soap.  6. Wash thoroughly, paying special attention to the area where your surgery will be performed.  7. Thoroughly rinse your body with warm water .  8. Do not shower/wash with your normal soap after using and rinsing off the CHG soap.  9. Pat yourself dry with a clean towel.  10. Wear clean pajamas to bed the night before surgery.  12. Place clean sheets on  your bed the night of your first shower and do not sleep with pets.  13. Shower again with the CHG soap on the day of surgery prior to arriving at the hospital.  14. Do not apply any deodorants/lotions/powders.  15. Please wear clean clothes to the hospital.

## 2024-01-01 ENCOUNTER — Encounter: Payer: Self-pay | Admitting: Urgent Care

## 2024-01-01 ENCOUNTER — Encounter
Admission: RE | Admit: 2024-01-01 | Discharge: 2024-01-01 | Disposition: A | Discharge status: Home / Self Care | Source: Ambulatory Visit | Attending: General Surgery | Admitting: General Surgery

## 2024-01-01 DIAGNOSIS — F17200 Nicotine dependence, unspecified, uncomplicated: Secondary | ICD-10-CM | POA: Insufficient documentation

## 2024-01-01 DIAGNOSIS — R7303 Prediabetes: Secondary | ICD-10-CM | POA: Insufficient documentation

## 2024-01-01 DIAGNOSIS — I70203 Unspecified atherosclerosis of native arteries of extremities, bilateral legs: Secondary | ICD-10-CM | POA: Insufficient documentation

## 2024-01-01 DIAGNOSIS — I700 Atherosclerosis of aorta: Secondary | ICD-10-CM | POA: Insufficient documentation

## 2024-01-01 DIAGNOSIS — I10 Essential (primary) hypertension: Secondary | ICD-10-CM | POA: Diagnosis not present

## 2024-01-01 DIAGNOSIS — Z01818 Encounter for other preprocedural examination: Secondary | ICD-10-CM | POA: Insufficient documentation

## 2024-01-01 DIAGNOSIS — I25119 Atherosclerotic heart disease of native coronary artery with unspecified angina pectoris: Secondary | ICD-10-CM | POA: Insufficient documentation

## 2024-01-01 LAB — BASIC METABOLIC PANEL WITH GFR
Anion gap: 12 (ref 5–15)
BUN: 19 mg/dL (ref 8–23)
CO2: 21 mmol/L — ABNORMAL LOW (ref 22–32)
Calcium: 9.3 mg/dL (ref 8.9–10.3)
Chloride: 99 mmol/L (ref 98–111)
Creatinine, Ser: 0.75 mg/dL (ref 0.61–1.24)
GFR, Estimated: >60 mL/min (ref >60)
Glucose, Bld: 97 mg/dL (ref 70–99)
Potassium: 4 mmol/L (ref 3.5–5.1)
Sodium: 132 mmol/L — ABNORMAL LOW (ref 135–145)

## 2024-01-01 LAB — CBC
HCT: 49.1 % (ref 39.0–52.0)
Hemoglobin: 17.4 g/dL — ABNORMAL HIGH (ref 13.0–17.0)
MCH: 33.3 pg (ref 26.0–34.0)
MCHC: 35.4 g/dL (ref 30.0–36.0)
MCV: 94.1 fL (ref 80.0–100.0)
Platelets: 212 10*3/uL (ref 150–400)
RBC: 5.22 MIL/uL (ref 4.22–5.81)
RDW: 13 % (ref 11.5–15.5)
WBC: 7.1 10*3/uL (ref 4.0–10.5)
nRBC: 0 % (ref 0.0–0.2)

## 2024-01-02 ENCOUNTER — Encounter: Payer: Self-pay | Admitting: General Surgery

## 2024-01-02 NOTE — Progress Notes (Incomplete)
 Perioperative / Anesthesia Services  Pre-Admission Testing Clinical Review / Pre-Operative Anesthesia Consult  Date: 01/07/24  PATIENT DEMOGRAPHICS: Name: Aaron [name] DOB: 1956/03/20 MRN:   [id number]  Note: Available PAT nursing documentation and vital signs have been reviewed. Clinical nursing staff has updated patient's PMH/PSHx, current medication list, and drug allergies/intolerances to ensure complete and comprehensive history available to assist care teams in MDM as it pertains to the aforementioned surgical procedure and anticipated anesthetic course. Extensive review of available clinical information personally performed. Aaron [name] PMH and PSHx updated with any diagnoses/procedures that  may have been inadvertently omitted during his intake with the pre-admission testing department's nursing staff.  PLANNED SURGICAL PROCEDURE(S):   Case: [id number] Date/Time: 01/08/24 1045   Procedure: HERNIORRHAPHY, INGUINAL, ROBOT-ASSISTED, LAPAROSCOPIC (Left: Inguinal) - with mesh   Anesthesia type: General   Diagnosis: Left inguinal hernia [K40.90]   Pre-op diagnosis: Left inguinal hernia   Location: [facility] OR ROOM 07 / [facility]   Surgeons: Marinda Jayson KIDD, MD        CLINICAL DISCUSSION: Aaron [name] is a 68 y.o. male who is submitted for pre-surgical anesthesia review and clearance prior to him undergoing the above procedure.  Patient is a Current Smoker (23 pack years). Pertinent PMH includes: CAD, diastolic dysfunction, CVA, unstable angina, PAD, aortic atherosclerosis, syncope, HTN, HLD, COPD, GERD (no daily Tx), LEFT inguinal hernia, glaucoma, cervical DDD,   Patient is followed by cardiology Andrea, MD). He was last seen in the cardiology clinic on 07/30/2023; notes reviewed. At the time of his clinic visit, patient reported episodes of occasional sharp stabbing chest pain that were generally self-limiting and unrelated to exertion.  Patient with chronic  exertional dyspnea which was reportedly stable and at baseline. Patient denied PND, orthopnea, palpitations, significant peripheral edema, weakness, fatigue, vertiginous symptoms, or presyncope/syncope. Patient with a past medical history significant for cardiovascular diagnoses. Documented physical exam was grossly benign, providing no evidence of acute exacerbation and/or decompensation of the patient's known cardiovascular conditions.  Patient has undergone multiple diagnostic LEFT heart catheterizations in the past.   Heart catheterization performed on an unknown date resulted in placement of stents (unknown type) to the proximal LAD and proximal RCA.   Heart catheterization performed on 06/19/2010 revealed multivessel CAD; 30% mid LM, 60% mid LAD, 60% proximal LCx, 50% proximal ramus intermedius, 50% distal RCA, 50% RPDA, and 50% RPLS.  There was 40% in-stent restenosis of a previously placed stent to the proximal RCA.  Further intervention was deferred opting for medical management.   Left heart catheterization was performed on 07/08/2014 revealing multivessel CAD; 10% proximal LM, 30% distal LAD, 75% ostial LCx, 30% mid RCA, and 40% RPDA.  Again. further intervention was deferred opting for medical management.   Most recent cardiac catheterization was performed on 12/28/2021 revealing multivessel CAD; 50% mid RCA, 65% RPDA, 45% mid to distal RCA, 60% ostial to proximal RCA, 95% ostial proximal LCx, 60% ramus intermedius, 30% proximal LAD, and 30% mid LM.  No intervention was performed.  Patient to pursue aggressive risk factor modification and aggressive management of his known coronary artery disease.    Patient with a known history of significant PVD/PAD.  He has undergone multiple PTA procedures to his BILATERAL lower extremities in the past.  Patient with stents in place.  Most recent TTE performed on 09/16/2017 revealed a normal left ventricular systolic function with an EF of 55-60%. There  was mild LVH.  There were no regional wall motion  abnormalities. Left ventricular diastolic Doppler parameters consistent with abnormal relaxation (G1DD). Right ventricular size and function normal with a TAPSE measuring 4.0 cm  (normal range >/= 1.6 cm).  There was trivial panvalvular  regurgitation.  All transvalvular gradients were noted to be normal providing no evidence of hemodynamically significant valvular stenosis. Aorta normal in size with no evidence of ectasia or aneurysmal dilatation.  Most recent myocardial perfusion imaging study was performed on 05/22/2023 revealing a  normal left ventricular systolic function with an EF of 61%.  There were no regional wall motion abnormalities.  No artifact or left ventricular cavity size enlargement appreciated on review of imaging. SPECT images demonstrated no evidence of stress-induced myocardial ischemia or arrhythmia; no scintigraphic evidence of scar.  TID ratio = 1.06. Study determined to be normal and low risk.  Given his extensive PVD/PAD, patient is on multiagent antiplatelet therapy using full dose ASA, cilostazol , and clopidogrel . Patient is reported to be compliant with therapy with no evidence or reports of GI bleeding.  Blood pressure well controlled at 130/78 mmHg on currently prescribed nitrate (isosorbide  mononitrate), ACEi (lisinopril ) and beta-blocker (metoprolol  tartrate) therapies. Patient is on simvastatin  for his HLD diagnosis and further ASCVD prevention.  In addition to the aforementioned nitrate therapy, patient has a supply of short acting nitrates (NTG) to use on a as needed basis; denied recent use. Patient is not diabetic. Patient does not have an OSAH diagnosis. Functional capacity limited by lower extremity claudication pain, however he is able to complete all of his  ADL/IADLs without cardiovascular limitation.  Per the DASI, patient is able to achieve at least 4 METS of physical activity without experiencing any significant  degree of angina/anginal equivalent symptoms. No changes were made to his medication regimen during his visit with cardiology.  Patient scheduled to follow-up with outpatient cardiology in 6 months or sooner if needed.  Aaron [name] is scheduled for an elective HERNIORRHAPHY, INGUINAL, ROBOT-ASSISTED, LAPAROSCOPIC (Left: Inguinal) on 01/08/2024 with Dr. Jayson Endow, MD.  Given patient's past medical history significant for cardiovascular diagnoses, presurgical cardiac clearance was sought by the PAT team. Per cardiology, this patient is optimized for surgery and may proceed with the planned procedural course with a LOW risk of significant perioperative cardiovascular complications. Patient also cleared by vascular surgery to proceed at a low risk and to hold his daily antithrombotic therapies.   Again, this patient is on daily oral antithrombotic therapy using multiple agents; clopidogrel  + ASA + cilostazol . He has been instructed on recommendations for holding his clopidogrel  and cilostazol  for 7 days prior to his procedure with plans to restart as soon as postoperative bleeding risk felt to be minimized by his primary attending surgeon. The patient has been instructed that his last dose of should be on 12/31/2023. Given that patient's past medical history is significant for cardiovascular diagnoses, including but not limited to CAD, general surgery has cleared patient to continue his daily low dose ASA throughout his perioperative course. He will be asked to hold his normal dose on the day of his procedure only. Patient has been updated on these directives from his specialty care providers by the PAT team.  Patient denies previous perioperative complications with anesthesia in the past. In review his EMR, it is noted that patient underwent a general anesthetic course here at [facility] (ASA III) in 04/2022 without documented complications.   MOST RECENT VITAL  SIGNS:    12/31/2023   11:01 AM 12/17/2023  10:52 AM 12/04/2023    1:35 PM  Vitals with BMI  Height 6' 1 6' 1 6' 1  Weight 195 lbs 189 lbs 194 lbs 2 oz  BMI 25.73 24.94 25.62  Systolic  114 126  Diastolic  69 72  Pulse  68 75   PROVIDERS/SPECIALISTS: NOTE: Primary physician provider listed below. Patient may have been seen by APP or partner within same practice.   PROVIDER ROLE / SPECIALTY LAST OV  Marinda Jayson KIDD, MD  General Surgery (Surgeon) 12/17/2023  Justus Leita DEL, MD Primary Care Provider 12/04/2023  Ammon Blunt, MD Cardiology 07/30/2023  Parris Manna, MD Pulmonary Medicine 08/02/2023   ALLERGIES: Allergies  Allergen Reactions   Contrast Media [Iodinated Contrast Media] Hives   Iodine  Hives    CURRENT HOME MEDICATIONS: No current facility-administered medications for this encounter.    albuterol  (VENTOLIN  HFA) 108 (90 Base) MCG/ACT inhaler   aspirin  EC 81 MG tablet   betamethasone  valerate (VALISONE ) 0.1 % cream   cilostazol  (PLETAL ) 50 MG tablet   clopidogrel  (PLAVIX ) 75 MG tablet   indomethacin  (INDOCIN ) 25 MG capsule   isosorbide  mononitrate (IMDUR ) 120 MG 24 hr tablet   lisinopril  (PRINIVIL ,ZESTRIL ) 10 MG tablet   metoprolol  tartrate (LOPRESSOR ) 50 MG tablet   nitroGLYCERIN  (NITROSTAT ) 0.4 MG SL tablet   simvastatin  (ZOCOR ) 40 MG tablet   HISTORY: Past Medical History:  Diagnosis Date   Aortic atherosclerosis (HCC)    COPD (chronic obstructive pulmonary disease) (HCC)    Coronary artery disease 06/19/2010   a.) LHC/PCI (unknown date):--> stent to pRCA and pLAD (unknown type); b.) LHC 06/19/2010: 30 mLM, 60 mLAD, 60 pLCx, 50 pRI, 40 ISR pRCA, 50 dRCA, 50 RPDA, 50 RPLS --> med mgmt; c.) LHC 07/08/2014: 10 pLM, 30 dLAD, 75 oLCx, 30 mRCA, 40 RPDA, patent RCA stent --> med mgmt; d.) LHC 12/28/2021: 50 mRCA, 65 RPDA, 45 m-dRCA, 60 o-pRCA, 95 o-pLCx, 60 RI, 30 pLAD, 30 mLM --> med mgmt.   CVA (cerebral vascular accident) [facility]    a.) noted on  brain MRI 09/17/2022 - > small remote infarct in the left middle cerebellar peduncle --> date of initial event unknown   DDD (degenerative disc disease), cervical    Diastolic dysfunction 09/16/2017   Fracture of left ankle, closed, initial encounter 10/23/2017   GERD (gastroesophageal reflux disease)    Glaucoma    Gout    Hepatic steatosis    Hypercholesteremia    Hypertension    Left inguinal hernia    Long term current use of antithrombotics/antiplatelets    a.) cilostazol  + low dose ASA + clopidogrel    PAD (peripheral artery disease) (HCC)    a.) s/p multiple PTA procedures to lower extremities   Status post bilateral cataract extraction    Syncope 09/16/2017   Unstable angina (HCC) 01/12/2017   Vertigo    Past Surgical History:  Procedure Laterality Date   APPENDECTOMY     CATARACT EXTRACTION W/PHACO Right 10/26/2021   Procedure: CATARACT EXTRACTION PHACO AND INTRAOCULAR LENS PLACEMENT (IOC) RIGHT OMIDRIA   10.98 01:18.2;  Surgeon: Enola Feliciano Hugger, MD;  Location: [facility];  Service: Ophthalmology;  Laterality: Right;   CATARACT EXTRACTION W/PHACO Left 11/16/2021   Procedure: CATARACT EXTRACTION PHACO AND INTRAOCULAR LENS PLACEMENT (IOC) LEFT;  Surgeon: Enola Feliciano Hugger, MD;  Location: [facility];  Service: Ophthalmology;  Laterality: Left;  6.91 0:46.0   COLONOSCOPY  03/30/2013   [place]   COLONOSCOPY WITH PROPOFOL  N/A 12/10/2019   Procedure: COLONOSCOPY WITH  PROPOFOL ;  Surgeon: Jinny Carmine, MD;  Location: [facility];  Service: Endoscopy;  Laterality: N/A;   COLONOSCOPY WITH PROPOFOL  N/A 01/18/2020   Procedure: COLONOSCOPY WITH PROPOFOL ;  Surgeon: Jinny Carmine, MD;  Location: [facility];  Service: Endoscopy;  Laterality: N/A;  4   CORONARY ANGIOPLASTY WITH STENT PLACEMENT Left    Procedure: CORONARY ANGIOPLASTY WITH STENT PLACEMENT   ENDARTERECTOMY FEMORAL Left 04/11/2022   Procedure: ENDARTERECTOMY FEMORAL ( SFA & TIBIAL);  insertion of SFA stent;  Surgeon: Marea Selinda RAMAN, MD;  Location: [facility] ORS;  Service: Vascular;  Laterality: Left;   LEFT HEART CATH AND CORONARY ANGIOGRAPHY N/A 12/29/2020   Procedure: LEFT HEART CATH AND CORONARY ANGIOGRAPHY;  Surgeon: Hester Wolm PARAS, MD;  Location: [facility] INVASIVE CV LAB;  Service: Cardiovascular;  Laterality: N/A;   LEFT HEART CATH AND CORONARY ANGIOGRAPHY Left 06/19/2010   Procedure: LEFT HEART CATH AND CORONARY ANGIOGRAPHY; Location: [facility]; Surgeon: Vita Bathe, MD   LEFT HEART CATH AND CORONARY ANGIOGRAPHY Left 07/08/2014   Procedure: LEFT HEART CATH AND CORONARY ANGIOGRAPHY; Location: [facility]; Surgeon: Marsa Dooms, MD   LOWER EXTREMITY ANGIOGRAM Left 04/11/2022   Procedure: LOWER EXTREMITY ANGIOGRAM;  Surgeon: Marea Selinda RAMAN, MD;  Location: [facility] ORS;  Service: Vascular;  Laterality: Left;   LOWER EXTREMITY ANGIOGRAPHY Right 09/25/2018   Procedure: LOWER EXTREMITY ANGIOGRAPHY;  Surgeon: Marea Selinda RAMAN, MD;  Location: [facility] CV [facility];  Service: Cardiovascular;  Laterality: Right;   LOWER EXTREMITY ANGIOGRAPHY Left 03/17/2020   Procedure: LOWER EXTREMITY ANGIOGRAPHY;  Surgeon: Marea Selinda RAMAN, MD;  Location: [facility] CV [facility];  Service: Cardiovascular;  Laterality: Left;   LOWER EXTREMITY ANGIOGRAPHY Right 02/22/2022   Procedure: Lower Extremity Angiography;  Surgeon: Marea Selinda RAMAN, MD;  Location: [facility] INVAS[facility] CV LAB;  Service: Cardiovascular;  Laterality: Right;   POLYPECTOMY N/A 01/18/2020   Procedure: POLYPECTOMY;  Surgeon: Jinny Carmine, MD;  Location: [facility];  Service: Endoscopy;  Laterality: N/A;   Family History  Problem Relation Age of Onset   Hypertension Mother    Diabetes Mother    Heart failure Father        age 17   Cancer Father    Ovarian cancer Sister    Heart disease Brother    Social History   Tobacco Use   Smoking status: Every Day    Types: Cigars    Passive exposure: Past   Smokeless tobacco: Never  Substance Use Topics    Alcohol use: Yes    Comment: quit drinking around 04/2019(May have drink 1x/mo)   LABS:  Hospital Outpatient Visit on 01/01/2024  Component Date Value Ref Range Status   WBC 01/01/2024 7.1  4.0 - 10.5 K/uL Final   RBC 01/01/2024 5.22  4.22 - 5.81 MIL/uL Final   Hemoglobin 01/01/2024 17.4 (H)  13.0 - 17.0 g/dL Final   HCT 93/74/7974 49.1  39.0 - 52.0 % Final   MCV 01/01/2024 94.1  80.0 - 100.0 fL Final   MCH 01/01/2024 33.3  26.0 - 34.0 pg Final   MCHC 01/01/2024 35.4  30.0 - 36.0 g/dL Final   RDW 93/74/7974 13.0  11.5 - 15.5 % Final   Platelets 01/01/2024 212  150 - 400 K/uL Final   nRBC 01/01/2024 0.0  0.0 - 0.2 % Final   Performed at [facility], [address] [place], [place] [address]   Sodium 01/01/2024 132 (L)  135 - 145 mmol/L Final   Potassium 01/01/2024 4.0  3.5 - 5.1 mmol/L  Final   Chloride 01/01/2024 99  98 - 111 mmol/L Final   CO2 01/01/2024 21 (L)  22 - 32 mmol/L Final   Glucose, Bld 01/01/2024 97  70 - 99 mg/dL Final   Glucose reference range applies only to samples taken after fasting for at least 8 hours.   BUN 01/01/2024 19  8 - 23 mg/dL Final   Creatinine, Ser 01/01/2024 0.75  0.61 - 1.24 mg/dL Final   Calcium 93/74/7974 9.3  8.9 - 10.3 mg/dL Final   GFR, Estimated 01/01/2024 >60  >60 mL/min Final   Comment: (NOTE) Calculated using the CKD-EPI Creatinine Equation (2021)    Anion gap 01/01/2024 12  5 - 15 Final   Performed at [facility], [address] [place], [place] [address]    ECG: Date: 01/01/2024  Time ECG obtained: 1021 PM Rate: 61 bpm Rhythm: normal sinus Axis (leads I and aVF): normal Intervals: PR 184 ms. QRS 102 ms. QTc 432 ms. ST segment and T wave changes: No evidence of acute T wave abnormalities or significant ST segment elevation or depression.  Evidence of a possible, age undetermined, prior infarct:  No Comparison: Similar to previous tracing obtained on 09/17/2022   IMAGING / PROCEDURES: DIAGNOSTIC  RADIOGRAPHS OF CHEST 2 VIEWS performed on 08/02/2023 No focal regions of consolidation, infiltrates no edema.   There is minimal blunting of the right costophrenic angle.   Cardiac silhouette is unremarkable.   Degenerative changes within the spine.   Remaining osseous structures soft tissues unremarkable.  Small effusion versus chronic scarring right costophrenic angle.   Otherwise no acute cardiopulmonary disease.   MYOCARDIAL PERFUSION IMAGING STUDY (LEXISCAN ) performed on 05/22/2023 Normal left ventricular systolic function with a normal LVEF of 61% Normal myocardial thickening and wall motion Left ventricular cavity size normal SPECT images demonstrate homogenous tracer distribution throughout the myocardium No evidence of stress-induced myocardial ischemia or arrhythmia Normal low risk study  CT CHEST LUNG CA SCREEN LOW DOSE W/O CM performed on 01/08/2023 Lung-RADS 2, benign appearance or behavior. Continue annual screening with low-dose chest CT without contrast in 12 months. Aortic atherosclerosis  Emphysema    MRI BRAIN AND CERVICAL SPINE performed on 09/17/2022 No evidence of acute intracranial abnormality. Moderate foraminal stenosis on the left at C4-C5 and the right at C6-C7. Mild canal stenosis at C4-C5 with disc contacting and flattening the cord. Milder multilevel degenerative changes detailed above.  VASCULAR ULTRASOUND ABI WITH/WITHOUT TBI performed on 08/13/2023 Resting right ankle-brachial index is within normal range. The right toe-brachial index is normal.  Resting left ankle-brachial index is within normal range. The left toe-brachial index is abnormal.    LEFT HEART CATHETERIZATION AND CORONARY ANGIOGRAPHY performed on 12/29/2020 Normal left ventricular systolic function with an EF of 53% Patent stents to the RCA and proximal LAD Multivessel CAD 50% mid RCA 65% RPDA 45% mid to distal RCA 60% ostial to proximal RCA 95% ostial proximal LCx 60% ramus  intermedius 30% proximal LAD 30 stent mid LM Recommendations No evidence of myocardial infarction or ACS -no further cardiac intervention at this time Counseled on discontinuation of tobacco abuse Cardiac rehabilitation for angina Continue risk factor modification with current medical regimen   TRANSTHORACIC ECHOCARDIOGRAM performed on 09/16/2017 Normal left ventricular systolic function with EF of 55 to 60% Mild LVH No regional wall motion abnormalities  Left ventricular diastolic Doppler parameters consistent with abnormal relaxation (G1DD). Right ventricular size and function normal Trivial paravalvular regurgitation Normal gradients; no valvular stenosis No pericardial effusion  IMPRESSION AND PLAN: Aaron [name] has been referred for pre-anesthesia review and clearance prior to him undergoing the planned anesthetic and procedural courses. Available labs, pertinent testing, and imaging results were personally reviewed by me in preparation for upcoming operative/procedural course. [facility] medical record has been updated following extensive record review and patient interview with PAT staff.   This patient has been appropriately cleared by cardiology with an overall LOW risk of patient experiencing significant perioperative cardiovascular complications. Based on clinical review performed today (01/07/24), barring any significant acute changes in the patient's overall condition, it is anticipated that he will be able to proceed with the planned surgical intervention. Any acute changes in clinical condition may necessitate his procedure being postponed and/or cancelled. Patient will meet with anesthesia team (MD and/or CRNA) on the day of his procedure for preoperative evaluation/assessment. Questions regarding anesthetic course will be fielded at that time.   Pre-surgical instructions were reviewed with the patient during his PAT appointment, and questions were fielded to  satisfaction by PAT clinical staff. He has been instructed on which medications that he will need to hold prior to surgery, as well as the ones that have been deemed safe/appropriate to take on the day of his procedure. As part of the general education provided by PAT, patient made aware both verbally and in writing, that he would need to abstain from the use of any illegal substances during his perioperative course. He was advised that failure to follow the provided instructions could necessitate case cancellation or result in serious perioperative complications up to and including death. Patient encouraged to contact PAT and/or his surgeon's office to discuss any questions or concerns that may arise prior to surgery; verbalized understanding.   Dorise Pereyra, MSN, APRN, FNP-C, CEN [facility]  Perioperative Services Nurse Practitioner Phone: [phone] Fax: [phone] 01/07/24 9:12 AM  NOTE: This note has been prepared using Dragon dictation software. Despite my best ability to proofread, there is always the potential that unintentional transcriptional errors may still occur from this process.

## 2024-01-08 ENCOUNTER — Other Ambulatory Visit: Payer: Self-pay

## 2024-01-08 ENCOUNTER — Encounter
Admission: RE | Disposition: A | Discharge status: Home / Self Care | Payer: Self-pay | Source: Home / Self Care | Attending: General Surgery

## 2024-01-08 ENCOUNTER — Ambulatory Visit: Payer: Self-pay | Admitting: Urgent Care

## 2024-01-08 ENCOUNTER — Encounter: Payer: Self-pay | Admitting: General Surgery

## 2024-01-08 ENCOUNTER — Ambulatory Visit
Admission: RE | Admit: 2024-01-08 | Discharge: 2024-01-08 | Disposition: A | Discharge status: Home / Self Care | Attending: General Surgery | Admitting: General Surgery

## 2024-01-08 DIAGNOSIS — Z7902 Long term (current) use of antithrombotics/antiplatelets: Secondary | ICD-10-CM | POA: Insufficient documentation

## 2024-01-08 DIAGNOSIS — F17290 Nicotine dependence, other tobacco product, uncomplicated: Secondary | ICD-10-CM | POA: Diagnosis not present

## 2024-01-08 DIAGNOSIS — I25110 Atherosclerotic heart disease of native coronary artery with unstable angina pectoris: Secondary | ICD-10-CM | POA: Diagnosis not present

## 2024-01-08 DIAGNOSIS — K219 Gastro-esophageal reflux disease without esophagitis: Secondary | ICD-10-CM | POA: Insufficient documentation

## 2024-01-08 DIAGNOSIS — I10 Essential (primary) hypertension: Secondary | ICD-10-CM | POA: Insufficient documentation

## 2024-01-08 DIAGNOSIS — F17200 Nicotine dependence, unspecified, uncomplicated: Secondary | ICD-10-CM | POA: Diagnosis not present

## 2024-01-08 DIAGNOSIS — K660 Peritoneal adhesions (postprocedural) (postinfection): Secondary | ICD-10-CM | POA: Insufficient documentation

## 2024-01-08 DIAGNOSIS — K4090 Unilateral inguinal hernia, without obstruction or gangrene, not specified as recurrent: Secondary | ICD-10-CM | POA: Insufficient documentation

## 2024-01-08 DIAGNOSIS — I2510 Atherosclerotic heart disease of native coronary artery without angina pectoris: Secondary | ICD-10-CM | POA: Insufficient documentation

## 2024-01-08 DIAGNOSIS — J449 Chronic obstructive pulmonary disease, unspecified: Secondary | ICD-10-CM | POA: Insufficient documentation

## 2024-01-08 HISTORY — DX: Cataract extraction status, right eye: Z98.41

## 2024-01-08 HISTORY — DX: Unilateral inguinal hernia, without obstruction or gangrene, not specified as recurrent: K40.90

## 2024-01-08 HISTORY — PX: INSERTION OF MESH: SHX5868

## 2024-01-08 HISTORY — DX: Other cervical disc degeneration, unspecified cervical region: M50.30

## 2024-01-08 HISTORY — PX: LAPAROSCOPIC LYSIS OF ADHESIONS: SHX5905

## 2024-01-08 HISTORY — PX: HERNIORRHAPHY, INGUINAL, ROBOT-ASSISTED, LAPAROSCOPIC: SHX7585

## 2024-01-08 HISTORY — DX: Cerebral infarction, unspecified: I63.9

## 2024-01-08 SURGERY — HERNIORRHAPHY, INGUINAL, ROBOT-ASSISTED, LAPAROSCOPIC
Anesthesia: General | Site: Inguinal | Laterality: Left

## 2024-01-08 MED ORDER — FENTANYL CITRATE (PF) 100 MCG/2ML IJ SOLN
INTRAMUSCULAR | Status: AC
  Filled 2024-01-08: qty 2

## 2024-01-08 MED ORDER — BUPIVACAINE-EPINEPHRINE (PF) 0.5% -1:200000 IJ SOLN
INTRAMUSCULAR | Status: AC
  Filled 2024-01-08: qty 10

## 2024-01-08 MED ORDER — ONDANSETRON HCL 4 MG/2ML IJ SOLN
INTRAMUSCULAR | Status: DC | PRN
  Administered 2024-01-08: 4 mg via INTRAVENOUS

## 2024-01-08 MED ORDER — KETAMINE HCL 50 MG/5ML IJ SOSY
PREFILLED_SYRINGE | INTRAMUSCULAR | Status: DC | PRN
  Administered 2024-01-08: 30 mg via INTRAVENOUS

## 2024-01-08 MED ORDER — FENTANYL CITRATE (PF) 100 MCG/2ML IJ SOLN
INTRAMUSCULAR | Status: DC | PRN
  Administered 2024-01-08 (×3): 50 ug via INTRAVENOUS

## 2024-01-08 MED ORDER — CHLORHEXIDINE GLUCONATE 0.12 % MT SOLN
15.0000 mL | Freq: Once | OROMUCOSAL | Status: AC
  Administered 2024-01-08: 15 mL via OROMUCOSAL

## 2024-01-08 MED ORDER — KETAMINE HCL 50 MG/5ML IJ SOSY
PREFILLED_SYRINGE | INTRAMUSCULAR | Status: AC
  Filled 2024-01-08: qty 5

## 2024-01-08 MED ORDER — BUPIVACAINE LIPOSOME 1.3 % IJ SUSP
INTRAMUSCULAR | Status: AC
Start: 2024-01-08 — End: 2024-01-08
  Filled 2024-01-08: qty 10

## 2024-01-08 MED ORDER — ACETAMINOPHEN 10 MG/ML IV SOLN
INTRAVENOUS | Status: AC
Start: 2024-01-08 — End: 2024-01-08
  Filled 2024-01-08: qty 100

## 2024-01-08 MED ORDER — ORAL CARE MOUTH RINSE
15.0000 mL | Freq: Once | OROMUCOSAL | Status: AC
Start: 2024-01-08 — End: 2024-01-08

## 2024-01-08 MED ORDER — CLOPIDOGREL BISULFATE 75 MG PO TABS: 75.0000 mg | ORAL_TABLET | Freq: Every day | ORAL | Status: AC

## 2024-01-08 MED ORDER — CEFAZOLIN SODIUM-DEXTROSE 2-4 GM/100ML-% IV SOLN
INTRAVENOUS | Status: AC
  Filled 2024-01-08: qty 100

## 2024-01-08 MED ORDER — PROPOFOL 10 MG/ML IV BOLUS
INTRAVENOUS | Status: AC
  Filled 2024-01-08: qty 20

## 2024-01-08 MED ORDER — MIDAZOLAM HCL 2 MG/2ML IJ SOLN
INTRAMUSCULAR | Status: AC
  Filled 2024-01-08: qty 2

## 2024-01-08 MED ORDER — CEFAZOLIN SODIUM-DEXTROSE 2-4 GM/100ML-% IV SOLN
2.0000 g | INTRAVENOUS | Status: AC
  Administered 2024-01-08: 2 g via INTRAVENOUS

## 2024-01-08 MED ORDER — DEXAMETHASONE SODIUM PHOSPHATE 10 MG/ML IJ SOLN
INTRAMUSCULAR | Status: DC | PRN
  Administered 2024-01-08: 10 mg via INTRAVENOUS

## 2024-01-08 MED ORDER — OXYCODONE HCL 5 MG PO TABS: 5.0000 mg | ORAL_TABLET | Freq: Four times a day (QID) | ORAL | 0 refills | Status: DC | PRN

## 2024-01-08 MED ORDER — DEXMEDETOMIDINE HCL IN NACL 80 MCG/20ML IV SOLN
INTRAVENOUS | Status: DC | PRN
  Administered 2024-01-08 (×3): 4 ug via INTRAVENOUS

## 2024-01-08 MED ORDER — OXYCODONE HCL 5 MG PO TABS
5.0000 mg | ORAL_TABLET | Freq: Once | ORAL | Status: AC | PRN
  Administered 2024-01-08: 5 mg via ORAL

## 2024-01-08 MED ORDER — PROPOFOL 10 MG/ML IV BOLUS
INTRAVENOUS | Status: DC | PRN
  Administered 2024-01-08: 150 mg via INTRAVENOUS

## 2024-01-08 MED ORDER — OXYCODONE HCL 5 MG PO TABS
ORAL_TABLET | ORAL | Status: AC
  Filled 2024-01-08: qty 1

## 2024-01-08 MED ORDER — ROCURONIUM BROMIDE 100 MG/10ML IV SOLN
INTRAVENOUS | Status: DC | PRN
  Administered 2024-01-08: 50 mg via INTRAVENOUS
  Administered 2024-01-08: 10 mg via INTRAVENOUS
  Administered 2024-01-08: 20 mg via INTRAVENOUS
  Administered 2024-01-08: 30 mg via INTRAVENOUS
  Administered 2024-01-08: 20 mg via INTRAVENOUS

## 2024-01-08 MED ORDER — ACETAMINOPHEN 10 MG/ML IV SOLN
INTRAVENOUS | Status: DC | PRN
  Administered 2024-01-08: 1000 mg via INTRAVENOUS

## 2024-01-08 MED ORDER — FENTANYL CITRATE (PF) 100 MCG/2ML IJ SOLN: 25.0000 ug | INTRAMUSCULAR | Status: DC | PRN

## 2024-01-08 MED ORDER — SUGAMMADEX SODIUM 200 MG/2ML IV SOLN
INTRAVENOUS | Status: DC | PRN
  Administered 2024-01-08: 200 mg via INTRAVENOUS

## 2024-01-08 MED ORDER — MIDAZOLAM HCL 2 MG/2ML IJ SOLN
INTRAMUSCULAR | Status: DC | PRN
  Administered 2024-01-08: 2 mg via INTRAVENOUS

## 2024-01-08 MED ORDER — HEMOSTATIC AGENTS (NO CHARGE) OPTIME
TOPICAL | Status: DC | PRN
  Administered 2024-01-08: 1 via TOPICAL

## 2024-01-08 MED ORDER — OXYCODONE HCL 5 MG/5ML PO SOLN: 5.0000 mg | Freq: Once | ORAL | Status: AC | PRN

## 2024-01-08 MED ORDER — CILOSTAZOL 50 MG PO TABS: 50.0000 mg | ORAL_TABLET | Freq: Two times a day (BID) | ORAL | 2 refills | Status: AC

## 2024-01-08 MED ORDER — BUPIVACAINE-EPINEPHRINE 0.5% -1:200000 IJ SOLN
INTRAMUSCULAR | Status: DC | PRN
  Administered 2024-01-08: 20 mL via INTRAMUSCULAR

## 2024-01-08 MED ORDER — LACTATED RINGERS IV SOLN: INTRAVENOUS | Status: DC

## 2024-01-08 MED ORDER — ASPIRIN 81 MG PO TBEC: 81.0000 mg | DELAYED_RELEASE_TABLET | Freq: Every day | ORAL | 12 refills | Status: AC

## 2024-01-08 MED ORDER — CHLORHEXIDINE GLUCONATE CLOTH 2 % EX PADS: 6.0000 | MEDICATED_PAD | Freq: Once | CUTANEOUS | Status: DC

## 2024-01-08 MED ORDER — LIDOCAINE HCL (CARDIAC) PF 100 MG/5ML IV SOSY
PREFILLED_SYRINGE | INTRAVENOUS | Status: DC | PRN
  Administered 2024-01-08: 80 mg via INTRAVENOUS

## 2024-01-08 MED ORDER — CHLORHEXIDINE GLUCONATE 0.12 % MT SOLN
OROMUCOSAL | Status: AC
  Filled 2024-01-08: qty 15

## 2024-01-08 SURGICAL SUPPLY — 42 items
BAG PRESSURE INF REUSE 1000 (BAG) IMPLANT
COVER TIP SHEARS 8 DVNC (MISCELLANEOUS) ×2 IMPLANT
COVER WAND RF STERILE (DRAPES) ×2 IMPLANT
DERMABOND ADVANCED .7 DNX12 (GAUZE/BANDAGES/DRESSINGS) ×2 IMPLANT
DRAPE ARM DVNC X/XI (DISPOSABLE) ×6 IMPLANT
DRAPE COLUMN DVNC XI (DISPOSABLE) ×2 IMPLANT
DRIVER NDL LRG 8 DVNC XI (INSTRUMENTS) ×2 IMPLANT
DRIVER NDLE LRG 8 DVNC XI (INSTRUMENTS) ×2 IMPLANT
ELECTRODE REM PT RTRN 9FT ADLT (ELECTROSURGICAL) ×2 IMPLANT
FORCEPS BPLR FENES DVNC XI (FORCEP) ×2 IMPLANT
FORCEPS BPLR R/ABLATION 8 DVNC (INSTRUMENTS) ×2 IMPLANT
GLOVE BIOGEL PI IND STRL 7.5 (GLOVE) ×4 IMPLANT
GLOVE SURG SYN 7.0 (GLOVE) ×10 IMPLANT
GLOVE SURG SYN 7.0 PF PI (GLOVE) ×4 IMPLANT
GOWN STRL REUS W/ TWL LRG LVL3 (GOWN DISPOSABLE) ×6 IMPLANT
IRRIGATION STRYKERFLOW (MISCELLANEOUS) IMPLANT
IRRIGATOR SUCT 8 DISP DVNC XI (IRRIGATION / IRRIGATOR) IMPLANT
IV NS 1000ML BAXH (IV SOLUTION) IMPLANT
KIT PINK PAD W/HEAD ARM REST (MISCELLANEOUS) ×2 IMPLANT
LABEL OR SOLS (LABEL) IMPLANT
MANIFOLD NEPTUNE II (INSTRUMENTS) ×2 IMPLANT
MESH PROGRIP LAP SLF FIX 16X12 (Mesh General) IMPLANT
NDL HYPO 22X1.5 SAFETY MO (MISCELLANEOUS) ×2 IMPLANT
NDL INSUFFLATION 14GA 120MM (NEEDLE) ×2 IMPLANT
NEEDLE HYPO 22X1.5 SAFETY MO (MISCELLANEOUS) ×2 IMPLANT
NEEDLE INSUFFLATION 14GA 120MM (NEEDLE) ×2 IMPLANT
OBTURATOR OPTICALSTD 8 DVNC (TROCAR) ×2 IMPLANT
PACK LAP CHOLECYSTECTOMY (MISCELLANEOUS) ×2 IMPLANT
POWDER SURGICEL 3.0 GRAM (HEMOSTASIS) IMPLANT
SCISSORS LAP 5X35 DISP (ENDOMECHANICALS) IMPLANT
SCISSORS MNPLR CVD DVNC XI (INSTRUMENTS) ×2 IMPLANT
SEAL UNIV 5-12 XI (MISCELLANEOUS) ×6 IMPLANT
SET TUBE SMOKE EVAC HIGH FLOW (TUBING) ×2 IMPLANT
SOLUTION ELECTROSURG ANTI STCK (MISCELLANEOUS) ×2 IMPLANT
SUT STRATA 2-0 23CM CT-2 (SUTURE) ×2 IMPLANT
SUT VIC AB 2-0 SH 27XBRD (SUTURE) ×2 IMPLANT
SUTURE MNCRL 4-0 27XMF (SUTURE) ×2 IMPLANT
TAPE TRANSPORE STRL 2 31045 (GAUZE/BANDAGES/DRESSINGS) IMPLANT
TIP ENDOSCOPIC SURGICEL (TIP) IMPLANT
TRAP FLUID SMOKE EVACUATOR (MISCELLANEOUS) ×2 IMPLANT
TROCAR Z-THREAD FIOS 5X100MM (TROCAR) IMPLANT
WATER STERILE IRR 500ML POUR (IV SOLUTION) ×2 IMPLANT

## 2024-01-08 NOTE — Op Note (Signed)
 Procedure Date:  01/08/2024  Pre-operative Diagnosis:  Left Inguinal Hernia  Post-operative Diagnosis: Left Inguinal Hernia  Procedure: 1.  Robotic assisted Left Inguinal Hernia Repair 2.  Creation of Left Posterior Rectus-Transversalis Fascia Advancment Flap for Coverage of Pelvic Wound (200 cm) 3. Lysis of adhesions fo 1 hour  Surgeon:  Jayson Endow, M.D.   Anesthesia:  General endotracheal  Estimated Blood Loss:  25 ml  Specimens:  None  Complications:  None  Indications for Procedure:  This is a 68 y.o. male who presents with a Left inguinal hernia.  The options of surgery versus observation were reviewed with the patient and/or family. The risks of bleeding, abscess or infection, recurrence of symptoms, potential for an open procedure, injury to surrounding structures, and chronic pain were all discussed with the patient and he was willing to proceed.  We have planned this transabdominal procedure with the creation of Left peritoneal flap based on the posterior rectus sheath and transversalis fascia in order to fully cover the mesh, creating a natural tisssue barrier for the bowel and peritoneal cavity.  Description of Procedure: The patient was correctly identified in the preoperative area and brought into the operating room.  The patient was placed supine with VTE prophylaxis in place.  Appropriate time-outs were performed.  Anesthesia was induced and the patient was intubated.   Appropriate antibiotics were infused.  The abdomen was prepped and draped in a sterile fashion. An incision was made at [place] and a veress needle was placed into the abdomen using standard drop technique. Pneumoperitoneum was then established. Using an optiview trocar a supra-umbilical robotic port was placed. No injury was noted at veress needle insertion site.  Upon survey of the abdomen there were dense omental adhesions to the anterior abdominal wall between the omentum and the right abdominal  wall secondary to a previous open appendectomy.  I was able to place a left midclavicular port under direct visualization.  Using the supraumbilical and midclavicular port I lysed several adhesions to be able to place a right midclavicular port under direct visualization.  The adhesions that were lysed were quite vascular and required use of LigaSure device to take down the omentum attachments to the abdominal wall.  The total time for lysis of adhesions was approximately 1 hour and contributed significantly to the difficulty of the case beyond what was expected.  At the end of the case I did place some Arista powder over the omentum given his history of antiplatelet therapy.  Both inguinal regions were inspected for hernias and it was confirmed that the patient had a left inguinal hernia.  Using electocautery, the peritoneal and posterior rectus tissue flap was created.  The peritoneum on the left side was scored from the median umbilical ligament laterally towards the ASIS.  The flap was mobilized using robotic scissors and the bipolar instruments, creating a plane along the posterior rectus sheath and transversalis fascia down to the pubic tubercle medially. It was then further mobilized laterally across the inguinal canal and femoral vessels and onto the psoas muscle. The inferior epigastric vessels were identified and preserved. This created a posterior rectus and peritoneal flap measuring roughly 17 cm x 12 cm.  The hernia sac and contents were reduced preserving all structures. The patient had a direct left hernia defect. A large pro-grip polypropelene mesh was then inserted into the abdomen along with a 2-0 v-lock suture. The mesh was placed into the pre-peritoneal space and there was good coverage of all hernia  spaces. Then, the peritoneal flap was advanced over the mesh and carried over to close the defect. A running 2-0 V lock suture was used to approximate the edge of the flap onto the  peritoneum.    All needles were removed under direct visualization.  The 8- mm ports were removed under direct visualization and the Hasson trocar was removed.   Local anesthetic was infused in all incisions  and the incisions were closed with 4-0 Monocryl.  The wounds were cleaned and sealed with DermaBond. The patient was emerged from anesthesia and extubated and brought to the recovery room for further management.  The patient tolerated the procedure well and all counts were correct at the end of the case.  Given the large burden of adhesive disease and requiring an additional hour of operative time to lyse adhesions this case was more difficult than what was expected.  Jayson Endow, M.D.

## 2024-01-08 NOTE — Anesthesia Preprocedure Evaluation (Addendum)
 Anesthesia Evaluation  Patient identified by MRN, date of birth, ID band Patient awake    Reviewed: Allergy & Precautions, NPO status , Patient's Chart, lab work & pertinent test results  History of Anesthesia Complications Negative for: history of anesthetic complications  Airway Mallampati: IV  TM Distance: >3 FB Neck ROM: full    Dental no notable dental hx.    Pulmonary COPD,  COPD inhaler, Current Smoker and Patient abstained from smoking.   Pulmonary exam normal        Cardiovascular hypertension, + CAD, + Cardiac Stents and + Peripheral Vascular Disease  Normal cardiovascular exam  Most recent TTE performed on 09/16/2017 revealed a normal left ventricular systolic function with an EF of 55-60%. There was mild LVH.  There were no regional wall motion abnormalities. Left ventricular diastolic Doppler parameters consistent with abnormal relaxation (G1DD). Right ventricular size and function normal with a TAPSE measuring 4.0 cm  (normal range >/= 1.6 cm).  There was trivial panvalvular  regurgitation.  All transvalvular gradients were noted to be normal providing no evidence of hemodynamically significant valvular stenosis. Aorta normal in size with no evidence of ectasia or aneurysmal dilatation.   Most recent myocardial perfusion imaging study was performed on 05/22/2023 revealing a  normal left ventricular systolic function with an EF of 61%.  There were no regional wall motion abnormalities.  No artifact or left ventricular cavity size enlargement appreciated on review of imaging. SPECT images demonstrated no evidence of stress-induced myocardial ischemia or arrhythmia; no scintigraphic evidence of scar.  TID ratio = 1.06. Study determined to be normal and low risk.    Neuro/Psych  Neuromuscular disease CVA  negative psych ROS   GI/Hepatic Neg liver ROS,GERD  ,,  Endo/Other  negative endocrine ROS    Renal/GU       Musculoskeletal   Abdominal   Peds  Hematology negative hematology ROS (+)   Anesthesia Other Findings Past Medical History: No date: Aortic atherosclerosis (HCC) No date: COPD (chronic obstructive pulmonary disease) (HCC) 06/19/2010: Coronary artery disease     Comment:  a.) LHC/PCI (unknown date):--> stent to pRCA and pLAD               (unknown type); b.) LHC 06/19/2010: 30 mLM, 60 mLAD, 60               pLCx, 50 pRI, 40 ISR pRCA, 50 dRCA, 50 RPDA, 50 RPLS -->               med mgmt; c.) LHC 07/08/2014: 10 pLM, 30 dLAD, 75 oLCx,               30 mRCA, 40 RPDA, patent RCA stent --> med mgmt; d.) LHC               12/28/2021: 50 mRCA, 65 RPDA, 45 m-dRCA, 60 o-pRCA, 95               o-pLCx, 60 RI, 30 pLAD, 30 mLM --> med mgmt. No date: CVA (cerebral vascular accident) [facility])     Comment:  a.) noted on brain MRI 09/17/2022 - > small remote               infarct in the left middle cerebellar peduncle --> date               of initial event unknown No date: DDD (degenerative disc disease), cervical 09/16/2017: Diastolic dysfunction 10/23/2017: Fracture of left ankle, closed, initial encounter No date:  GERD (gastroesophageal reflux disease) No date: Glaucoma No date: Gout No date: Hepatic steatosis No date: Hypercholesteremia No date: Hypertension No date: Left inguinal hernia No date: Long term current use of antithrombotics/antiplatelets     Comment:  a.) cilostazol  + low dose ASA + clopidogrel  No date: PAD (peripheral artery disease) (HCC)     Comment:  a.) s/p multiple PTA procedures to lower extremities No date: Status post bilateral cataract extraction 09/16/2017: Syncope 01/12/2017: Unstable angina (HCC) No date: Vertigo  Past Surgical History: No date: APPENDECTOMY 10/26/2021: CATARACT EXTRACTION W/PHACO; Right     Comment:  Procedure: CATARACT EXTRACTION PHACO AND INTRAOCULAR               LENS PLACEMENT (IOC) RIGHT OMIDRIA   10.98 01:18.2;                Surgeon:  Enola Feliciano Hugger, MD;  Location: [facility];  Service: Ophthalmology;  Laterality:               Right; 11/16/2021: CATARACT EXTRACTION W/PHACO; Left     Comment:  Procedure: CATARACT EXTRACTION PHACO AND INTRAOCULAR               LENS PLACEMENT (IOC) LEFT;  Surgeon: Enola Feliciano Hugger, MD;  Location: [facility];  Service:               Ophthalmology;  Laterality: Left;  6.91 0:46.0 03/30/2013: COLONOSCOPY     Comment:  [place] 12/10/2019: COLONOSCOPY WITH PROPOFOL ; N/A     Comment:  Procedure: COLONOSCOPY WITH PROPOFOL ;  Surgeon: Jinny Carmine, MD;  Location: [facility];  Service:               Endoscopy;  Laterality: N/A; 01/18/2020: COLONOSCOPY WITH PROPOFOL ; N/A     Comment:  Procedure: COLONOSCOPY WITH PROPOFOL ;  Surgeon: Jinny Carmine, MD;  Location: [facility];  Service:               Endoscopy;  Laterality: N/A;  4 No date: CORONARY ANGIOPLASTY WITH STENT PLACEMENT; Left     Comment:  Procedure: CORONARY ANGIOPLASTY WITH STENT PLACEMENT 04/11/2022: ENDARTERECTOMY FEMORAL; Left     Comment:  Procedure: ENDARTERECTOMY FEMORAL ( SFA & TIBIAL);               insertion of SFA stent;  Surgeon: Marea Selinda RAMAN, MD;                Location: [facility] ORS;  Service: Vascular;  Laterality:               Left; 12/29/2020: LEFT HEART CATH AND CORONARY ANGIOGRAPHY; N/A     Comment:  Procedure: LEFT HEART CATH AND CORONARY ANGIOGRAPHY;                Surgeon: Hester Wolm PARAS, MD;  Location: [facility] INVASIVE               CV LAB;  Service: Cardiovascular;  Laterality: N/A; 06/19/2010: LEFT HEART CATH AND CORONARY ANGIOGRAPHY; Left     Comment:  Procedure: LEFT HEART CATH AND CORONARY ANGIOGRAPHY;  Location: [facility]; Surgeon: Vita Bathe, MD 07/08/2014: LEFT HEART CATH AND CORONARY ANGIOGRAPHY; Left     Comment:  Procedure: LEFT HEART CATH AND CORONARY ANGIOGRAPHY;               Location:  [facility]; Surgeon: Marsa Dooms, MD 04/11/2022: LOWER EXTREMITY ANGIOGRAM; Left     Comment:  Procedure: LOWER EXTREMITY ANGIOGRAM;  Surgeon: Marea Selinda RAMAN, MD;  Location: [facility] ORS;  Service: Vascular;                Laterality: Left; 09/25/2018: LOWER EXTREMITY ANGIOGRAPHY; Right     Comment:  Procedure: LOWER EXTREMITY ANGIOGRAPHY;  Surgeon: Marea Selinda RAMAN, MD;  Location: [facility] INVASIVE CV LAB;  Service:               Cardiovascular;  Laterality: Right; 03/17/2020: LOWER EXTREMITY ANGIOGRAPHY; Left     Comment:  Procedure: LOWER EXTREMITY ANGIOGRAPHY;  Surgeon: Marea Selinda RAMAN, MD;  Location: [facility] INVAS[facility] CV LAB;  Service:               Cardiovascular;  Laterality: Left; 02/22/2022: LOWER EXTREMITY ANGIOGRAPHY; Right     Comment:  Procedure: Lower Extremity Angiography;  Surgeon: Marea Selinda RAMAN, MD;  Location: [facility] INVASIVE CV LAB;  Service:               Cardiovascular;  Laterality: Right; 01/18/2020: POLYPECTOMY; N/A     Comment:  Procedure: POLYPECTOMY;  Surgeon: Jinny Carmine, MD;                Location: [facility] SURGERY CNTR;  Service: Endoscopy;                Laterality: N/A;  BMI    Body Mass Index: 25.07 kg/m      Reproductive/Obstetrics negative OB ROS                              Anesthesia Physical Anesthesia Plan  ASA: 3  Anesthesia Plan: General ETT   Post-op Pain Management: Toradol  IV (intra-op)*, Ofirmev  IV (intra-op)*, Dilaudid  IV and Ketamine  IV*   Induction: Intravenous  PONV Risk Score and Plan: 2 and Ondansetron , Dexamethasone and Treatment may vary due to age or medical condition  Airway Management Planned: Oral ETT  Additional Equipment:   Intra-op Plan:   Post-operative Plan: Extubation in OR  Informed Consent: I have reviewed the patients History and Physical, chart, labs and discussed the procedure including the risks, benefits and alternatives for the proposed  anesthesia with the patient or authorized representative who has indicated his/her understanding and acceptance.     Dental Advisory Given  Plan Discussed with: Anesthesiologist, CRNA and Surgeon  Anesthesia Plan Comments: (Patient consented for risks of anesthesia including but not limited to:  - adverse reactions to medications - damage to eyes, teeth, lips or other oral mucosa - nerve damage due to positioning  - sore throat or hoarseness - Damage to heart, brain, nerves, lungs, other parts of body or loss of life  Patient voiced understanding and assent.)         Anesthesia Quick Evaluation

## 2024-01-08 NOTE — H&P (Signed)
 Patient has been off plavix  and pletal  for 7 days. Proceed with LEFT inguinal hernia repair with mesh, robotic.   CC: Left Inguinal Hernia History of Present Illness Aaron [name] is a 68 y.o. male with past medical history significant for coronary artery disease, peripheral vascular disease and smoker who present in consultation for left inguinal hernia.  The patient reports that he has noticed a bulge in his left groin.  He noticed this over the last several weeks.  He says that there are no overlying skin changes or obstipation.  The blood is able to get he denies any previous inguinal surgical history on the left side.  He denies any problems with the right side.  He is on aspirin , Plavix  and Pletal ..   Past Medical History     Past Medical History:  Diagnosis Date   Aortic atherosclerosis (HCC)     COPD (chronic obstructive pulmonary disease) (HCC)     Coronary artery disease 06/19/2010    a.) LHC/PCI (unknown date):--> stent to pRCA and pLAD (unknown type); b.) LHC 06/19/2010: 30 mLM, 60 mLAD, 60 pLCx, 50 pRI, 40 ISR pRCA, 50 dRCA, 50 RPDA, 50 RPLS --> med mgmt; c.) LHC 07/08/2014: 10 pLM, 30 dLAD, 75 oLCx, 30 mRCA, 40 RPDA, patent RCA stent --> med mgmt; d.) LHC 12/28/2021: 50 mRCA, 65 RPDA, 45 m-dRCA, 60 o-pRCA, 95 o-pLCx, 60 RI, 30 pLAD, 30 mLM --> med mgmt.   CVA (cerebral vascular accident) [facility])      a.) noted on brain MRI 09/17/2022 - > small remote infarct in the left middle cerebellar peduncle --> date of initial event unknown   DDD (degenerative disc disease), cervical     Diastolic dysfunction 09/16/2017   Fracture of left ankle, closed, initial encounter 10/23/2017   GERD (gastroesophageal reflux disease)     Glaucoma     Gout     Hepatic steatosis     Hypercholesteremia     Hypertension     Left inguinal hernia     Long term current use of antithrombotics/antiplatelets      a.) cilostazol  + low dose ASA + clopidogrel    PAD (peripheral artery disease) (HCC)      a.)  s/p multiple PTA procedures to lower extremities   Status post bilateral cataract extraction     Syncope 09/16/2017   Unstable angina (HCC) 01/12/2017   Vertigo                   Past Surgical History:  Procedure Laterality Date   APPENDECTOMY       CATARACT EXTRACTION W/PHACO Right 10/26/2021    Procedure: CATARACT EXTRACTION PHACO AND INTRAOCULAR LENS PLACEMENT (IOC) RIGHT OMIDRIA   10.98 01:18.2;  Surgeon: Enola Feliciano Hugger, MD;  Location: [facility];  Service: Ophthalmology;  Laterality: Right;   CATARACT EXTRACTION W/PHACO Left 11/16/2021    Procedure: CATARACT EXTRACTION PHACO AND INTRAOCULAR LENS PLACEMENT (IOC) LEFT;  Surgeon: Enola Feliciano Hugger, MD;  Location: [facility] SUR[facility];  Service: Ophthalmology;  Laterality: Left;  6.91 0:46.0   COLONOSCOPY   03/30/2013    [place]   COLONOSCOPY WITH PROPOFOL  N/A 12/10/2019    Procedure: COLONOSCOPY WITH PROPOFOL ;  Surgeon: Jinny Carmine, MD;  Location: [facility] SURGERY CNTR;  Service: Endoscopy;  Laterality: N/A;   COLONOSCOPY WITH PROPOFOL  N/A 01/18/2020    Procedure: COLONOSCOPY WITH PROPOFOL ;  Surgeon: Jinny Carmine, MD;  Location: [facility] SURGERY CNTR;  Service: Endoscopy;  Laterality: N/A;  4   CORONARY ANGIOPLASTY WITH STENT  PLACEMENT Left      Procedure: CORONARY ANGIOPLASTY WITH STENT PLACEMENT   ENDARTERECTOMY FEMORAL Left 04/11/2022    Procedure: ENDARTERECTOMY FEMORAL ( SFA & TIBIAL); insertion of SFA stent;  Surgeon: Marea Selinda RAMAN, MD;  Location: [facility] ORS;  Service: Vascular;  Laterality: Left;   LEFT HEART CATH AND CORONARY ANGIOGRAPHY N/A 12/29/2020    Procedure: LEFT HEART CATH AND CORONARY ANGIOGRAPHY;  Surgeon: Hester Wolm PARAS, MD;  Location: [facility] CV LAB;  Service: Cardiovascular;  Laterality: N/A;   LEFT HEART CATH AND CORONARY ANGIOGRAPHY Left 06/19/2010    Procedure: LEFT HEART CATH AND CORONARY ANGIOGRAPHY; Location: [facility]; Surgeon: Vita Bathe, MD   LEFT HEART CATH AND CORONARY  ANGIOGRAPHY Left 07/08/2014    Procedure: LEFT HEART CATH AND CORONARY ANGIOGRAPHY; Location: [facility]; Surgeon: Marsa Dooms, MD   LOWER EXTREMITY ANGIOGRAM Left 04/11/2022    Procedure: LOWER EXTREMITY ANGIOGRAM;  Surgeon: Marea Selinda RAMAN, MD;  Location: [facility] OR[facility];  Service: Vascular;  Laterality: Left;   LOWER EXTREMITY ANGIOGRAPHY Right 09/25/2018    Procedure: LOWER EXTREMITY ANGIOGRAPHY;  Surgeon: Marea Selinda RAMAN, MD;  Location: [facility] CV LAB;  Service: Cardiovascular;  Laterality: Right;   LOWER EXTREMITY ANGIOGRAPHY Left 03/17/2020    Procedure: LOWER EXTREMITY ANGIOGRAPHY;  Surgeon: Marea Selinda RAMAN, MD;  Location: [facility] CV LAB;  Service: Cardiovascular;  Laterality: Left;   LOWER EXTREMITY ANGIOGRAPHY Right 02/22/2022    Procedure: Lower Extremity Angiography;  Surgeon: Marea Selinda RAMAN, MD;  Location: [facility] CV LAB;  Service: Cardiovascular;  Laterality: Right;   POLYPECTOMY N/A 01/18/2020    Procedure: POLYPECTOMY;  Surgeon: Jinny Carmine, MD;  Location: [facility] SUR[facility];  Service: Endoscopy;  Laterality: N/A;          Allergies      Allergies  Allergen Reactions   Contrast Media [Iodinated Contrast Media] Hives   Iodine  Hives              Current Outpatient Medications  Medication Sig Dispense Refill   albuterol  (VENTOLIN  HFA) 108 (90 Base) MCG/ACT inhaler Inhale 2 puffs into the lungs every 6 (six) hours as needed for wheezing or shortness of breath. 6.7 g 5   aspirin  EC 81 MG tablet Take 81 mg by mouth daily. Swallow whole.       betamethasone  valerate (VALISONE ) 0.1 % cream Apply topically 2 (two) times daily. To rash on buttocks (Patient taking differently: Apply 1 Application topically 2 (two) times daily as needed (rash).) 45 g 1   cilostazol  (PLETAL ) 50 MG tablet TAKE 1 TABLET BY MOUTH TWICE  DAILY 200 tablet 2   clopidogrel  (PLAVIX ) 75 MG tablet Take 1 tablet by mouth daily.       indomethacin  (INDOCIN ) 25 MG capsule Take 1 capsule (25 mg total) by  mouth 2 (two) times daily as needed. 30 capsule 1   isosorbide  mononitrate (IMDUR ) 120 MG 24 hr tablet Take 1 tablet (120 mg total) by mouth daily. 30 tablet 0   lisinopril  (PRINIVIL ,ZESTRIL ) 10 MG tablet Take 10 mg by mouth daily.       metoprolol  tartrate (LOPRESSOR ) 50 MG tablet Take 50-75 mg by mouth See admin instructions. Take 50 mg by mouth every morning and 75 mg every evening       nitroGLYCERIN  (NITROSTAT ) 0.4 MG SL tablet Place 0.4 mg under the tongue every 5 (five) minutes as needed for chest pain.       simvastatin  (ZOCOR ) 40 MG tablet Take 1 tablet (40 mg total)  by mouth at bedtime. 30 tablet 5      No current facility-administered medications for this visit.        Family History      Family History  Problem Relation Age of Onset   Hypertension Mother     Diabetes Mother     Heart failure Father          age 38   Cancer Father     Ovarian cancer Sister     Heart disease Brother              Social History Social History  Social History         Tobacco Use   Smoking status: Every Day      Types: Cigars      Passive exposure: Past   Smokeless tobacco: Never  Vaping Use   Vaping status: Never Used  Substance Use Topics   Alcohol use: Yes      Comment: quit drinking around 04/2019(May have drink 1x/mo)   Drug use: No            ROS Full ROS of systems performed and is otherwise negative there than what is stated in the HPI   Physical Exam Blood pressure 114/69, pulse 68, height 6' 1 (1.854 m), weight 189 lb (85.7 kg), SpO2 95%.   Alert and oriented x 3, normal work of breathing on room air, regular rate and rhythm, abdomen is soft, nontender and nondistended.  Left groin there is a obvious bulge that I am able to reduce easily.  There is a left groin incision that is well-healed.   Data Reviewed Labs and imaging reviewed.  I also reviewed his notes from the pulmonologist, as well as his cardiologist and vascular surgeon   I have personally  reviewed the patient's imaging and medical records.     Assessment Assessment Patient with reducible left inguinal hernia.  He would like this repaired.  Given that he has a scar over his left groin I would like to try to perform this minimally invasive.  We will have him undergo cardiac restriction as you are that he can hold his Plavix  and Pletal  7 days prior to the procedure.  I discussed the risk, benefits alternatives to the procedure including risk of infection, bleeding which she is at increased risk for, recurrence, chronic pain, damage to the vas deferens and testicular ischemia. He wishes to proceed with surgery.   A total of 45 minutes was spent reviewing the patient's chart, performing history and physical and discussing treatment options with the patient       Aaron [name]

## 2024-01-08 NOTE — Progress Notes (Signed)
 Dr. Marinda in with the patient.

## 2024-01-08 NOTE — Transfer of Care (Signed)
 Immediate Anesthesia Transfer of Care Note  Patient: Aaron [name]  Procedure(s) Performed: HERNIORRHAPHY, INGUINAL, ROBOT-ASSISTED, LAPAROSCOPIC (Left: Inguinal) INSERTION OF MESH LYSIS, ADHESIONS, LAPAROSCOPIC  Patient Location: PACU  Anesthesia Type:General  Level of Consciousness: drowsy and patient cooperative  Airway & Oxygen Therapy: Patient Spontanous Breathing and Patient connected to face mask oxygen  Post-op Assessment: Report given to RN and Post -op Vital signs reviewed and stable  Post vital signs: Reviewed and stable  Last Vitals:  Vitals Value Taken Time  BP 179/80 01/08/24 14:45  Temp 36.6 C 01/08/24 14:41  Pulse 58 01/08/24 14:50  Resp 16 01/08/24 14:50  SpO2 98 % 01/08/24 14:50  Vitals shown include unfiled device data.  Last Pain:  Vitals:   01/08/24 0921  TempSrc: Tympanic  PainSc: 0-No pain         Complications: No notable events documented.

## 2024-01-08 NOTE — Brief Op Note (Signed)
 01/08/2024  2:53 PM  PATIENT:  Aaron [name]  68 y.o. male  PRE-OPERATIVE DIAGNOSIS:  Left inguinal hernia  POST-OPERATIVE DIAGNOSIS:  Left inguinal hernia  PROCEDURE:  Procedure(s) with comments: HERNIORRHAPHY, INGUINAL, ROBOT-ASSISTED, LAPAROSCOPIC (Left) - with mesh INSERTION OF MESH LYSIS, ADHESIONS, LAPAROSCOPIC  SURGEON:  Surgeons and Role:    * Marinda Jayson KIDD, MD - Primary  PHYSICIAN ASSISTANT:   ASSISTANTS: none   ANESTHESIA:   general  EBL:  50 mL   BLOOD ADMINISTERED:none  DRAINS: none   LOCAL MEDICATIONS USED:  MARCAINE    and BUPIVICAINE   SPECIMEN:  No Specimen  DISPOSITION OF SPECIMEN:  N/A  COUNTS:  YES  TOURNIQUET:  * No tourniquets in log *  DICTATION: .Dragon Dictation  PLAN OF CARE: Discharge to home after PACU  PATIENT DISPOSITION:  PACU - hemodynamically stable.   Delay start of Pharmacological VTE agent (>24hrs) due to surgical blood loss or risk of bleeding: no

## 2024-01-08 NOTE — Anesthesia Procedure Notes (Signed)
 Procedure Name: Intubation Date/Time: 01/08/2024 11:53 AM  Performed by: Lorriane Arabia, CRNAPre-anesthesia Checklist: Patient identified, Patient being monitored, Timeout performed, Emergency Drugs available and Suction available Patient Re-evaluated:Patient Re-evaluated prior to induction Oxygen Delivery Method: Circle system utilized Preoxygenation: Pre-oxygenation with 100% oxygen Induction Type: IV induction Ventilation: Mask ventilation without difficulty Laryngoscope Size: 4 and McGrath Grade View: Grade III Tube type: Oral Tube size: 7.0 mm Number of attempts: 1 Airway Equipment and Method: Stylet Placement Confirmation: ETT inserted through vocal cords under direct vision, positive ETCO2 and breath sounds checked- equal and bilateral Secured at: 22 cm Tube secured with: Tape Dental Injury: Teeth and Oropharynx as per pre-operative assessment

## 2024-01-09 ENCOUNTER — Encounter: Payer: Self-pay | Admitting: General Surgery

## 2024-01-11 NOTE — Anesthesia Postprocedure Evaluation (Signed)
 Anesthesia Post Note  Patient: Aaron [name]  Procedure(s) Performed: HERNIORRHAPHY, INGUINAL, ROBOT-ASSISTED, LAPAROSCOPIC (Left: Inguinal) INSERTION OF MESH LYSIS, ADHESIONS, LAPAROSCOPIC  Patient location during evaluation: PACU Anesthesia Type: General Level of consciousness: awake and alert Pain management: pain level controlled Vital Signs Assessment: post-procedure vital signs reviewed and stable Respiratory status: spontaneous breathing, nonlabored ventilation, respiratory function stable and patient connected to nasal cannula oxygen Cardiovascular status: blood pressure returned to baseline and stable Postop Assessment: no apparent nausea or vomiting Anesthetic complications: no   No notable events documented.   Last Vitals:  Vitals:   01/08/24 1516 01/08/24 1554  BP: (!) 165/80 (!) 156/79  Pulse: (!) 55 62  Resp: 12 16  Temp: (!) 36.3 C (!) 36.2 C  SpO2: 97% 98%    Last Pain:  Vitals:   01/08/24 1554  TempSrc: Temporal  PainSc: 5                  [name]

## 2024-01-13 ENCOUNTER — Telehealth: Payer: Self-pay | Admitting: General Surgery

## 2024-01-13 NOTE — Telephone Encounter (Signed)
 Patient had robotic inguinal hernia done 01/08/24 with Dr. Marinda. Patient states that since the day after surgery is has a lot of purple from the surgical site to his groin area. Please call patient

## 2024-01-14 ENCOUNTER — Ambulatory Visit

## 2024-01-14 NOTE — Telephone Encounter (Signed)
 Per Dr Marinda her spoke with the patient and he will be seen this Thursday for follow up.

## 2024-01-15 ENCOUNTER — Ambulatory Visit

## 2024-01-16 ENCOUNTER — Encounter: Admitting: General Surgery

## 2024-01-16 DIAGNOSIS — K4090 Unilateral inguinal hernia, without obstruction or gangrene, not specified as recurrent: Secondary | ICD-10-CM

## 2024-01-17 ENCOUNTER — Ambulatory Visit (INDEPENDENT_AMBULATORY_CARE_PROVIDER_SITE_OTHER): Admitting: Internal Medicine

## 2024-01-17 ENCOUNTER — Other Ambulatory Visit
Admission: RE | Admit: 2024-01-17 | Discharge: 2024-01-17 | Disposition: A | Discharge status: Home / Self Care | Attending: Internal Medicine | Admitting: Internal Medicine

## 2024-01-17 ENCOUNTER — Encounter: Payer: Self-pay | Admitting: Internal Medicine

## 2024-01-17 ENCOUNTER — Ambulatory Visit: Payer: Self-pay | Admitting: Internal Medicine

## 2024-01-17 VITALS — BP 122/70 | HR 89 | Ht 73.0 in | Wt 187.0 lb

## 2024-01-17 DIAGNOSIS — M25552 Pain in left hip: Secondary | ICD-10-CM | POA: Diagnosis not present

## 2024-01-17 DIAGNOSIS — R233 Spontaneous ecchymoses: Secondary | ICD-10-CM | POA: Insufficient documentation

## 2024-01-17 DIAGNOSIS — T148XXA Other injury of unspecified body region, initial encounter: Secondary | ICD-10-CM | POA: Diagnosis not present

## 2024-01-17 DIAGNOSIS — H9201 Otalgia, right ear: Secondary | ICD-10-CM

## 2024-01-17 LAB — CBC WITH DIFFERENTIAL/PLATELET
Abs Immature Granulocytes: 0.05 K/uL (ref 0.00–0.07)
Basophils Absolute: 0.1 K/uL (ref 0.0–0.1)
Basophils Relative: 1 %
Eosinophils Absolute: 0.2 K/uL (ref 0.0–0.5)
Eosinophils Relative: 2 %
HCT: 37.9 % — ABNORMAL LOW (ref 39.0–52.0)
Hemoglobin: 13 g/dL (ref 13.0–17.0)
Immature Granulocytes: 1 %
Lymphocytes Relative: 20 %
Lymphs Abs: 2.1 K/uL (ref 0.7–4.0)
MCH: 32.8 pg (ref 26.0–34.0)
MCHC: 34.3 g/dL (ref 30.0–36.0)
MCV: 95.7 fL (ref 80.0–100.0)
Monocytes Absolute: 0.9 K/uL (ref 0.1–1.0)
Monocytes Relative: 8 %
Neutro Abs: 7 K/uL (ref 1.7–7.7)
Neutrophils Relative %: 68 %
Platelets: 306 K/uL (ref 150–400)
RBC: 3.96 MIL/uL — ABNORMAL LOW (ref 4.22–5.81)
RDW: 13.3 % (ref 11.5–15.5)
WBC: 10.2 K/uL (ref 4.0–10.5)
nRBC: 0 % (ref 0.0–0.2)

## 2024-01-17 NOTE — Progress Notes (Signed)
 Date:  01/17/2024   Name:  Aaron [name]   DOB:  1956/06/27   MRN:  [id number]   Chief Complaint: Ear Pain (X 1 week. Rt Ear Pain on and off. )  Otalgia  There is pain in the right ear. This is a new problem. The current episode started in the past 7 days. There has been no fever. The pain is mild.  Hip Pain  There was no injury mechanism. The pain is present in the left hip. The quality of the pain is described as aching and cramping. The pain is moderate. The pain has been Fluctuating since onset. The symptoms are aggravated by weight bearing.  Post op bruising - extensive ecchymoses of abdomen, flanks and scrotum.  Feels lightheaded at times, no chest pain or change in shortness of breath.   Review of Systems  Constitutional:  Negative for chills, fatigue and fever.  HENT:  Positive for ear pain.   Musculoskeletal:  Positive for arthralgias. Negative for myalgias.  Neurological:  Positive for light-headedness. Negative for dizziness.  Psychiatric/Behavioral:  Negative for dysphoric mood and sleep disturbance. The patient is not nervous/anxious.      Lab Results  Component Value Date   NA 132 (L) 01/01/2024   K 4.0 01/01/2024   CO2 21 (L) 01/01/2024   GLUCOSE 97 01/01/2024   BUN 19 01/01/2024   CREATININE 0.75 01/01/2024   CALCIUM 9.3 01/01/2024   EGFR 89 04/10/2023   GFRNONAA >60 01/01/2024   Lab Results  Component Value Date   CHOL 118 04/10/2023   HDL 31 (L) 04/10/2023   LDLCALC 65 04/10/2023   TRIG 124 04/10/2023   CHOLHDL 3.8 04/10/2023   Lab Results  Component Value Date   TSH 3.214 02/06/2019   Lab Results  Component Value Date   HGBA1C 5.5 04/10/2023   Lab Results  Component Value Date   WBC 7.1 01/01/2024   HGB 17.4 (H) 01/01/2024   HCT 49.1 01/01/2024   MCV 94.1 01/01/2024   PLT 212 01/01/2024   Lab Results  Component Value Date   ALT 33 04/10/2023   AST 26 04/10/2023   ALKPHOS 68 04/10/2023   BILITOT 0.3 04/10/2023   No results found  for: MARIEN BOLLS, VD25OH   Patient Active Problem List   Diagnosis Date Noted   Left inguinal hernia 01/16/2024   BPH with lower urinary tract symptoms without urinary obstruction 04/10/2023   Atherosclerosis of artery of both lower extremities (HCC) 04/11/2022   Hepatic steatosis 01/04/2022   Aortic atherosclerosis (HCC) 07/10/2020   Stage 2 moderate COPD by GOLD classification (HCC) 05/16/2020   Dyspepsia and disorder of function of stomach 05/16/2020   Cervical disc disorder of mid-cervical region 05/10/2020   Polyp of descending colon    Mixed hyperlipidemia 10/28/2018   Atherosclerosis of native arteries of extremity with intermittent claudication (HCC) 07/13/2016   Tobacco use disorder 07/13/2016   Prediabetes 07/27/2015   Controlled gout 12/22/2014   Coronary artery disease involving native coronary artery of native heart with angina pectoris (HCC) 10/09/2014   Essential (primary) hypertension 10/09/2014   Palmar fascial fibromatosis (dupuytren) 10/09/2014   Psoriasis 10/09/2014   H/O cardiac catheterization 01/27/2014   Peripheral vascular disease (HCC) 01/27/2014    Allergies  Allergen Reactions   Contrast Media [Iodinated Contrast Media] Hives   Iodine  Hives    Past Surgical History:  Procedure Laterality Date   APPENDECTOMY     CATARACT EXTRACTION W/PHACO Right 10/26/2021   Procedure:  CATARACT EXTRACTION PHACO AND INTRAOCULAR LENS PLACEMENT (IOC) RIGHT OMIDRIA   10.98 01:18.2;  Surgeon: Enola Feliciano Hugger, MD;  Location: [facility];  Service: Ophthalmology;  Laterality: Right;   CATARACT EXTRACTION W/PHACO Left 11/16/2021   Procedure: CATARACT EXTRACTION PHACO AND INTRAOCULAR LENS PLACEMENT (IOC) LEFT;  Surgeon: Enola Feliciano Hugger, MD;  Location: [facility];  Service: Ophthalmology;  Laterality: Left;  6.91 0:46.0   COLONOSCOPY  03/30/2013   [place]   COLONOSCOPY WITH PROPOFOL  N/A 12/10/2019   Procedure: COLONOSCOPY WITH  PROPOFOL ;  Surgeon: Jinny Carmine, MD;  Location: [facility];  Service: Endoscopy;  Laterality: N/A;   COLONOSCOPY WITH PROPOFOL  N/A 01/18/2020   Procedure: COLONOSCOPY WITH PROPOFOL ;  Surgeon: Jinny Carmine, MD;  Location: [facility];  Service: Endoscopy;  Laterality: N/A;  4   CORONARY ANGIOPLASTY WITH STENT PLACEMENT Left    Procedure: CORONARY ANGIOPLASTY WITH STENT PLACEMENT   ENDARTERECTOMY FEMORAL Left 04/11/2022   Procedure: ENDARTERECTOMY FEMORAL ( SFA & TIBIAL); insertion of SFA stent;  Surgeon: Marea Selinda RAMAN, MD;  Location: [facility];  Service: Vascular;  Laterality: Left;   HERNIORRHAPHY, INGUINAL, ROBOT-ASSISTED, LAPAROSCOPIC Left 01/08/2024   Procedure: HERNIORRHAPHY, INGUINAL, ROBOT-ASSISTED, LAPAROSCOPIC;  Surgeon: Marinda Jayson KIDD, MD;  Location: [facility];  Service: General;  Laterality: Left;  with mesh   INSERTION OF MESH  01/08/2024   Procedure: INSERTION OF MESH;  Surgeon: Marinda Jayson KIDD, MD;  Location: [facility];  Service: General;;   LAPAROSCOPIC LYSIS OF ADHESIONS  01/08/2024   Procedure: LYSIS, ADHESIONS, LAPAROSCOPIC;  Surgeon: Marinda Jayson KIDD, MD;  Location: [facility];  Service: General;;   LEFT HEART CATH AND CORONARY ANGIOGRAPHY N/A 12/29/2020   Procedure: LEFT HEART CATH AND CORONARY ANGIOGRAPHY;  Surgeon: Hester Wolm PARAS, MD;  Location: [facility];  Service: Cardiovascular;  Laterality: N/A;   LEFT HEART CATH AND CORONARY ANGIOGRAPHY Left 06/19/2010   Procedure: LEFT HEART CATH AND CORONARY ANGIOGRAPHY; Location: [facility]; Surgeon: Vita Bathe, MD   LEFT HEART CATH AND CORONARY ANGIOGRAPHY Left 07/08/2014   Procedure: LEFT HEART CATH AND CORONARY ANGIOGRAPHY; Location: [facility]; Surgeon: Marsa Dooms, MD   LOWER EXTREMITY ANGIOGRAM Left 04/11/2022   Procedure: LOWER EXTREMITY ANGIOGRAM;  Surgeon: Marea Selinda RAMAN, MD;  Location: [facility];  Service: Vascular;  Laterality: Left;   LOWER EXTREMITY ANGIOGRAPHY Right 09/25/2018   Procedure: LOWER  EXTREMITY ANGIOGRAPHY;  Surgeon: Marea Selinda RAMAN, MD;  Location: [facility];  Service: Cardiovascular;  Laterality: Right;   LOWER EXTREMITY ANGIOGRAPHY Left 03/17/2020   Procedure: LOWER EXTREMITY ANGIOGRAPHY;  Surgeon: Marea Selinda RAMAN, MD;  Location: [facility];  Service: Cardiovascular;  Laterality: Left;   LOWER EXTREMITY ANGIOGRAPHY Right 02/22/2022   Procedure: Lower Extremity Angiography;  Surgeon: Marea Selinda RAMAN, MD;  Location: [facility];  Service: Cardiovascular;  Laterality: Right;   POLYPECTOMY N/A 01/18/2020   Procedure: POLYPECTOMY;  Surgeon: Jinny Carmine, MD;  Location: [facility];  Service: Endoscopy;  Laterality: N/A;    Social History   Tobacco Use   Smoking status: Every Day    Types: Cigars    Passive exposure: Past   Smokeless tobacco: Never  Vaping Use   Vaping status: Never Used  Substance Use Topics   Alcohol use: Yes    Comment: quit drinking around 04/2019(May have drink 1x/mo)   Drug use: No     Medication list has been reviewed and updated.  No outpatient medications have been marked as taking for the 01/17/24  encounter (Office Visit) with Justus Leita DEL, MD.       01/17/2024    1:58 PM 12/04/2023    1:42 PM 09/18/2023   10:04 AM 04/10/2023    9:13 AM  GAD 7 : Generalized Anxiety Score  Nervous, Anxious, on Edge 0 0 0 0  Control/stop worrying 0 0 0 0  Worry too much - different things 0 0 0 0  Trouble relaxing 0 0 0 0  Restless 0 0 0 0  Easily annoyed or irritable 0 0 0 0  Afraid - awful might happen 0 0 0 0  Total GAD 7 Score 0 0 0 0  Anxiety Difficulty Not difficult at all Not difficult at all Not difficult at all Not difficult at all       01/17/2024    1:57 PM 12/04/2023    1:42 PM 10/02/2023    4:03 PM  Depression screen PHQ 2/9  Decreased Interest 0 0 0  Down, Depressed, Hopeless 0 0 0  PHQ - 2 Score 0 0 0  Altered sleeping 0 0 0  Tired, decreased energy 0 0 0  Change in appetite 0 0 0  Feeling bad  or failure about yourself  0 0 0  Trouble concentrating 0 0 0  Moving slowly or fidgety/restless 0 0 0  Suicidal thoughts 0 0 0  PHQ-9 Score 0 0 0  Difficult doing work/chores Not difficult at all Not difficult at all Not difficult at all    BP Readings from Last 3 Encounters:  01/17/24 122/70  01/08/24 (!) 156/79  12/17/23 114/69    Physical Exam Vitals and nursing note reviewed.  Constitutional:      General: He is not in acute distress.    Appearance: Normal appearance. He is well-developed.  HENT:     Head: Normocephalic and atraumatic.     Right Ear: Tympanic membrane and ear canal normal.     Left Ear: Tympanic membrane and ear canal normal.  Cardiovascular:     Rate and Rhythm: Normal rate and regular rhythm.  Pulmonary:     Effort: Pulmonary effort is normal. No respiratory distress.     Breath sounds: No wheezing or rhonchi.  Abdominal:     General: Abdomen is flat.     Palpations: Abdomen is soft.      Comments: Ecchymoses Lap incision sites intact with glue  Musculoskeletal:     Left hip: No tenderness, bony tenderness or crepitus. Normal range of motion.  Skin:    General: Skin is warm and dry.     Findings: No rash.  Neurological:     Mental Status: He is alert and oriented to person, place, and time.  Psychiatric:        Mood and Affect: Mood normal.        Behavior: Behavior normal.     Wt Readings from Last 3 Encounters:  01/17/24 187 lb (84.8 kg)  01/08/24 190 lb (86.2 kg)  12/31/23 195 lb (88.5 kg)    BP 122/70   Pulse 89   Ht 6' 1 (1.854 m)   Wt 187 lb (84.8 kg)   SpO2 98%   BMI 24.67 kg/m   Assessment and Plan:  Problem List Items Addressed This Visit   None Visit Diagnoses       Otalgia of right ear    -  Primary   no evidence of infection or fluid can try Flonase  or Coricidin     Bruising  extensive post op bruising of abd/flanks and scrotum will check CBC due to lightheadedness   Relevant Orders   CBC with  Differential/Platelet     Pain of left hip       may be soft tissue vs joint; PVD less likely recommend conservative treatment with advil and ice       No follow-ups on file.    Leita HILARIO Adie, MD [facility] [place]

## 2024-01-20 ENCOUNTER — Telehealth (INDEPENDENT_AMBULATORY_CARE_PROVIDER_SITE_OTHER): Payer: Self-pay

## 2024-01-20 ENCOUNTER — Encounter: Payer: Self-pay | Admitting: General Surgery

## 2024-01-20 NOTE — Telephone Encounter (Signed)
 Patient reach out to the office and stated that for the past three days he is been having sharp pain from the buttocks down the left leg. He is also having numbness and tingling. Patient is not having any swelling. Patient was off blood thinners for 2 weeks from recent hernia surgery and restarted back medication last Monday. Please Advise

## 2024-01-20 NOTE — Telephone Encounter (Signed)
 He should come in sooner than his current follow up on 8/8, keep the same studies

## 2024-01-21 ENCOUNTER — Encounter: Payer: Self-pay | Admitting: General Surgery

## 2024-01-21 ENCOUNTER — Ambulatory Visit (INDEPENDENT_AMBULATORY_CARE_PROVIDER_SITE_OTHER): Admitting: General Surgery

## 2024-01-21 VITALS — BP 100/63 | HR 80 | Temp 98.3°F | Ht 73.0 in | Wt 186.0 lb

## 2024-01-21 DIAGNOSIS — Z09 Encounter for follow-up examination after completed treatment for conditions other than malignant neoplasm: Secondary | ICD-10-CM

## 2024-01-21 DIAGNOSIS — K4090 Unilateral inguinal hernia, without obstruction or gangrene, not specified as recurrent: Secondary | ICD-10-CM

## 2024-01-21 NOTE — Patient Instructions (Signed)

## 2024-01-22 NOTE — Progress Notes (Signed)
 Patient is status post left inguinal hernia repair.  The case was made difficult secondary to extensive lysis of adhesions.  I had talked to him on the phone earlier in his postoperative period due to significant bruising across his abdomen.  He says that his abdomen bruising is slowly improving but still significant.  He denies any pain with it.  He has not been using any ice with it.  He reports that there is no bulge or pain in his left groin.  He is tolerating a regular diet and having normal bowel function.  He is urinating appropriately.  On exam his abdomen is soft but there is significant bruising across his whole abdomen from his pubis up to his umbilicus.  There is also some swelling at the base of his penis with some bruising here.  His left groin is nonecchymotic and there is no bulge or evidence of recurrence of hernia.  His incision sites are surrounded by his bruising but they do not look infected nor are there any drainage from the area.  Patient s/p left inguinal hernia repair.  He did require extensive lysis of adhesions and he is on Pletal , aspirin  and Plavix .  I am not surprised that he has significant bruising to his abdomen.  He is not having any pain from this.  He has no evidence of recurrence of hernia.  I discussed with him that the bruising should get better over time and that he can use ice over the abdomen.  He can follow-up with us  as needed if the bruising does not get better.  I recommend he continue lifting restrictions no greater than 10 to 15 pounds for another 4 weeks.  He now can submerge the wounds in water .

## 2024-01-28 ENCOUNTER — Other Ambulatory Visit (INDEPENDENT_AMBULATORY_CARE_PROVIDER_SITE_OTHER): Payer: Self-pay | Admitting: Vascular Surgery

## 2024-01-28 DIAGNOSIS — Z9889 Other specified postprocedural states: Secondary | ICD-10-CM

## 2024-01-29 ENCOUNTER — Ambulatory Visit
Admission: RE | Admit: 2024-01-29 | Discharge: 2024-01-29 | Disposition: A | Discharge status: Home / Self Care | Source: Ambulatory Visit | Attending: Acute Care | Admitting: Acute Care

## 2024-01-29 DIAGNOSIS — F17210 Nicotine dependence, cigarettes, uncomplicated: Secondary | ICD-10-CM | POA: Insufficient documentation

## 2024-01-29 DIAGNOSIS — Z87891 Personal history of nicotine dependence: Secondary | ICD-10-CM | POA: Diagnosis not present

## 2024-01-29 DIAGNOSIS — Z122 Encounter for screening for malignant neoplasm of respiratory organs: Secondary | ICD-10-CM | POA: Diagnosis not present

## 2024-01-30 ENCOUNTER — Other Ambulatory Visit (INDEPENDENT_AMBULATORY_CARE_PROVIDER_SITE_OTHER)

## 2024-01-30 ENCOUNTER — Encounter (INDEPENDENT_AMBULATORY_CARE_PROVIDER_SITE_OTHER): Payer: Self-pay | Admitting: Nurse Practitioner

## 2024-01-30 ENCOUNTER — Ambulatory Visit (INDEPENDENT_AMBULATORY_CARE_PROVIDER_SITE_OTHER): Admitting: Nurse Practitioner

## 2024-01-30 VITALS — BP 136/76 | HR 60 | Resp 18 | Wt 187.2 lb

## 2024-01-30 DIAGNOSIS — E782 Mixed hyperlipidemia: Secondary | ICD-10-CM | POA: Diagnosis not present

## 2024-01-30 DIAGNOSIS — Z9889 Other specified postprocedural states: Secondary | ICD-10-CM | POA: Diagnosis not present

## 2024-01-30 DIAGNOSIS — I10 Essential (primary) hypertension: Secondary | ICD-10-CM | POA: Diagnosis not present

## 2024-01-30 DIAGNOSIS — I739 Peripheral vascular disease, unspecified: Secondary | ICD-10-CM

## 2024-01-30 DIAGNOSIS — F17200 Nicotine dependence, unspecified, uncomplicated: Secondary | ICD-10-CM | POA: Diagnosis not present

## 2024-01-30 NOTE — Progress Notes (Signed)
 Subjective:    Patient ID: Aaron [name], male    DOB: May 16, 1956, 68 y.o.   MRN: [id number] Chief Complaint  Patient presents with   Follow-up    6 month abi follow up    The patient presents today slightly earlier than his routine follow-up due to having some pain and discomfort in his left lower extremity.  He notes that when he began to experience the pain he had been off of his Plavix  and aspirin  for about 2 weeks prior to having hernia surgery.  He notes that he began to feel pain in his left buttocks that radiated down to his knee.  During that time it was numb and tingling.  He notes that after a short amount of time with walking the pain went away.  He notes that he is taken some ibuprofen and that helped the pain is largely resolved at this time.  He denies any open wounds or ulcerations.  He denies any classic claudication-like symptoms.  He denies the development of rest pain.  Today's noninvasive studies show an ABI of 1.00 on the right and 0.96 on the left.  His previous studies done on 08/13/2023 show a right ABI 1.01 and a left of 1.02.  He continues to have strong triphasic tibial waveforms bilaterally with good toe waveforms.    Review of Systems  Cardiovascular:  Negative for leg swelling.  Musculoskeletal:  Positive for arthralgias.  Hematological:  Bruises/bleeds easily.  All other systems reviewed and are negative.      Objective:   Physical Exam Vitals reviewed.  HENT:     Head: Normocephalic.  Cardiovascular:     Rate and Rhythm: Normal rate and regular rhythm.     Pulses:          Dorsalis pedis pulses are detected w/ Doppler on the right side and detected w/ Doppler on the left side.       Posterior tibial pulses are detected w/ Doppler on the right side and detected w/ Doppler on the left side.  Pulmonary:     Effort: Pulmonary effort is normal.  Skin:    General: Skin is warm and dry.  Neurological:     Mental Status: He is alert and oriented to  person, place, and time.  Psychiatric:        Mood and Affect: Mood normal.        Behavior: Behavior normal.        Thought Content: Thought content normal.        Judgment: Judgment normal.     BP 136/76   Pulse 60   Resp 18   Wt 187 lb 3.2 oz (84.9 kg)   BMI 24.70 kg/m   Past Medical History:  Diagnosis Date   Aortic atherosclerosis (HCC)    COPD (chronic obstructive pulmonary disease) (HCC)    Coronary artery disease 06/19/2010   a.) LHC/PCI (unknown date):--> stent to pRCA and pLAD (unknown type); b.) LHC 06/19/2010: 30 mLM, 60 mLAD, 60 pLCx, 50 pRI, 40 ISR pRCA, 50 dRCA, 50 RPDA, 50 RPLS --> med mgmt; c.) LHC 07/08/2014: 10 pLM, 30 dLAD, 75 oLCx, 30 mRCA, 40 RPDA, patent RCA stent --> med mgmt; d.) LHC 12/28/2021: 50 mRCA, 65 RPDA, 45 m-dRCA, 60 o-pRCA, 95 o-pLCx, 60 RI, 30 pLAD, 30 mLM --> med mgmt.   CVA (cerebral vascular accident) (HCC)    a.) noted on brain MRI 09/17/2022 - > small remote infarct in the left middle cerebellar peduncle -->  date of initial event unknown   DDD (degenerative disc disease), cervical    Diastolic dysfunction 09/16/2017   Fracture of left ankle, closed, initial encounter 10/23/2017   GERD (gastroesophageal reflux disease)    Glaucoma    Gout    Hepatic steatosis    Hypercholesteremia    Hypertension    Left inguinal hernia    Long term current use of antithrombotics/antiplatelets    a.) cilostazol  + low dose ASA + clopidogrel    PAD (peripheral artery disease) (HCC)    a.) s/p multiple PTA procedures to lower extremities   Status post bilateral cataract extraction    Syncope 09/16/2017   Unstable angina (HCC) 01/12/2017   Vertigo     Social History   Socioeconomic History   Marital status: Single    Spouse name: Not on file   Number of children: 0   Years of education: Not on file   Highest education level: 12th grade  Occupational History   Occupation: Retired/Disabled  Tobacco Use   Smoking status: Every Day    Types:  Cigars    Passive exposure: Past   Smokeless tobacco: Never  Vaping Use   Vaping status: Never Used  Substance and Sexual Activity   Alcohol use: Yes    Comment: quit drinking around 04/2019(May have drink 1x/mo)   Drug use: No   Sexual activity: Not Currently    Birth control/protection: None  Other Topics Concern   Not on file  Social History Narrative   Independent at baseline. Lives alone. Does not drive.    Social Drivers of Corporate investment banker Strain: Low Risk  (10/02/2023)   Overall Financial Resource Strain (CARDIA)    Difficulty of Paying Living Expenses: Not hard at all  Food Insecurity: No Food Insecurity (10/02/2023)   Hunger Vital Sign    Worried About Running Out of Food in the Last Year: Never true    Ran Out of Food in the Last Year: Never true  Transportation Needs: No Transportation Needs (10/02/2023)   PRAPARE - Administrator, Civil Service (Medical): No    Lack of Transportation (Non-Medical): No  Physical Activity: Sufficiently Active (10/02/2023)   Exercise Vital Sign    Days of Exercise per Week: 7 days    Minutes of Exercise per Session: 30 min  Stress: No Stress Concern Present (10/02/2023)   [employer] of Occupational Health - Occupational Stress Questionnaire    Feeling of Stress : Not at all  Social Connections: Socially Isolated (10/02/2023)   Social Connection and Isolation Panel    Frequency of Communication with Friends and Family: More than three times a week    Frequency of Social Gatherings with Friends and Family: More than three times a week    Attends Religious Services: Never    Database administrator or Organizations: No    Attends Banker Meetings: Never    Marital Status: Never married  Intimate Partner Violence: Not At Risk (10/02/2023)   Humiliation, Afraid, Rape, and Kick questionnaire    Fear of Current or Ex-Partner: No    Emotionally Abused: No    Physically Abused: No    Sexually Abused:  No    Past Surgical History:  Procedure Laterality Date   APPENDECTOMY     CATARACT EXTRACTION W/PHACO Right 10/26/2021   Procedure: CATARACT EXTRACTION PHACO AND INTRAOCULAR LENS PLACEMENT (IOC) RIGHT OMIDRIA   10.98 01:18.2;  Surgeon: Enola Feliciano Hugger, MD;  Location: [facility];  Service: Ophthalmology;  Laterality: Right;   CATARACT EXTRACTION W/PHACO Left 11/16/2021   Procedure: CATARACT EXTRACTION PHACO AND INTRAOCULAR LENS PLACEMENT (IOC) LEFT;  Surgeon: Enola Feliciano Hugger, MD;  Location: [facility];  Service: Ophthalmology;  Laterality: Left;  6.91 0:46.0   COLONOSCOPY  03/30/2013   [place]   COLONOSCOPY WITH PROPOFOL  N/A 12/10/2019   Procedure: COLONOSCOPY WITH PROPOFOL ;  Surgeon: Jinny Carmine, MD;  Location: [facility];  Service: Endoscopy;  Laterality: N/A;   COLONOSCOPY WITH PROPOFOL  N/A 01/18/2020   Procedure: COLONOSCOPY WITH PROPOFOL ;  Surgeon: Jinny Carmine, MD;  Location: [facility];  Service: Endoscopy;  Laterality: N/A;  4   CORONARY ANGIOPLASTY WITH STENT PLACEMENT Left    Procedure: CORONARY ANGIOPLASTY WITH STENT PLACEMENT   ENDARTERECTOMY FEMORAL Left 04/11/2022   Procedure: ENDARTERECTOMY FEMORAL ( SFA & TIBIAL); insertion of SFA stent;  Surgeon: Marea Selinda RAMAN, MD;  Location: [facility];  Service: Vascular;  Laterality: Left;   HERNIORRHAPHY, INGUINAL, ROBOT-ASSISTED, LAPAROSCOPIC Left 01/08/2024   Procedure: HERNIORRHAPHY, INGUINAL, ROBOT-ASSISTED, LAPAROSCOPIC;  Surgeon: Marinda Jayson KIDD, MD;  Location: [facility];  Service: General;  Laterality: Left;  with mesh   INSERTION OF MESH  01/08/2024   Procedure: INSERTION OF MESH;  Surgeon: Marinda Jayson KIDD, MD;  Location: [facility];  Service: General;;   LAPAROSCOPIC LYSIS OF ADHESIONS  01/08/2024   Procedure: LYSIS, ADHESIONS, LAPAROSCOPIC;  Surgeon: Marinda Jayson KIDD, MD;  Location: [facility];  Service: General;;   LEFT HEART CATH AND CORONARY ANGIOGRAPHY N/A 12/29/2020   Procedure:  LEFT HEART CATH AND CORONARY ANGIOGRAPHY;  Surgeon: Hester Wolm PARAS, MD;  Location: [facility];  Service: Cardiovascular;  Laterality: N/A;   LEFT HEART CATH AND CORONARY ANGIOGRAPHY Left 06/19/2010   Procedure: LEFT HEART CATH AND CORONARY ANGIOGRAPHY; Location: [facility]; Surgeon: Vita Bathe, MD   LEFT HEART CATH AND CORONARY ANGIOGRAPHY Left 07/08/2014   Procedure: LEFT HEART CATH AND CORONARY ANGIOGRAPHY; Location: [facility]; Surgeon: Marsa Dooms, MD   LOWER EXTREMITY ANGIOGRAM Left 04/11/2022   Procedure: LOWER EXTREMITY ANGIOGRAM;  Surgeon: Marea Selinda RAMAN, MD;  Location: [facility];  Service: Vascular;  Laterality: Left;   LOWER EXTREMITY ANGIOGRAPHY Right 09/25/2018   Procedure: LOWER EXTREMITY ANGIOGRAPHY;  Surgeon: Marea Selinda RAMAN, MD;  Location: [facility];  Service: Cardiovascular;  Laterality: Right;   LOWER EXTREMITY ANGIOGRAPHY Left 03/17/2020   Procedure: LOWER EXTREMITY ANGIOGRAPHY;  Surgeon: Marea Selinda RAMAN, MD;  Location: [facility];  Service: Cardiovascular;  Laterality: Left;   LOWER EXTREMITY ANGIOGRAPHY Right 02/22/2022   Procedure: Lower Extremity Angiography;  Surgeon: Marea Selinda RAMAN, MD;  Location: [facility];  Service: Cardiovascular;  Laterality: Right;   POLYPECTOMY N/A 01/18/2020   Procedure: POLYPECTOMY;  Surgeon: Jinny Carmine, MD;  Location: [facility];  Service: Endoscopy;  Laterality: N/A;    Family History  Problem Relation Age of Onset   Hypertension Mother    Diabetes Mother    Heart failure Father        age 43   Cancer Father    Ovarian cancer Sister    Heart disease Brother     Allergies  Allergen Reactions   Contrast Media [Iodinated Contrast Media] Hives   Iodine  Hives       Latest Ref Rng & Units 01/17/2024    2:29 PM 01/01/2024   10:16 AM 04/10/2023   10:17 AM  CBC  WBC 4.0 - 10.5 K/uL 10.2  7.1  8.6  Hemoglobin 13.0 - 17.0 g/dL 86.9  82.5  83.2   Hematocrit 39.0 - 52.0 % 37.9  49.1  47.7    Platelets 150 - 400 K/uL 306  212  203       CMP     Component Value Date/Time   NA 132 (L) 01/01/2024 1016   NA 136 04/10/2023 1017   NA 137 07/02/2014 0513   K 4.0 01/01/2024 1016   K 4.0 07/02/2014 0513   CL 99 01/01/2024 1016   CL 103 07/02/2014 0513   CO2 21 (L) 01/01/2024 1016   CO2 26 07/02/2014 0513   GLUCOSE 97 01/01/2024 1016   GLUCOSE 109 (H) 07/02/2014 0513   BUN 19 01/01/2024 1016   BUN 19 04/10/2023 1017   BUN 15 07/02/2014 0513   CREATININE 0.75 01/01/2024 1016   CREATININE 0.81 07/02/2014 0513   CALCIUM 9.3 01/01/2024 1016   CALCIUM 8.7 07/02/2014 0513   PROT 6.5 04/10/2023 1017   PROT 7.9 12/03/2011 2127   ALBUMIN 4.3 04/10/2023 1017   ALBUMIN 4.1 12/03/2011 2127   AST 26 04/10/2023 1017   AST 23 12/03/2011 2127   ALT 33 04/10/2023 1017   ALT 22 12/03/2011 2127   ALKPHOS 68 04/10/2023 1017   ALKPHOS 90 12/03/2011 2127   BILITOT 0.3 04/10/2023 1017   BILITOT 0.2 12/03/2011 2127   EGFR 89 04/10/2023 1017   GFRNONAA >60 01/01/2024 1016   GFRNONAA >60 07/02/2014 0513   GFRNONAA >60 11/07/2012 0442     No results found.     Assessment & Plan:   1. Peripheral arterial disease with history of revascularization (HCC) (Primary) Today noninvasive studies show there has not been any real significant change since his previous studies that were done on 08/13/2023.  Based upon the patient's description of symptoms and the timing I suspect that this is more sciatic pain.  The pain has resolved and he is not having any lingering or residual issues.  We will continue to have the patient follow-up in 6 months.  However he is advised that if he begins to experience any further issues he is welcome to follow-up sooner.  2. Essential (primary) hypertension Continue antihypertensive medications as already ordered, these medications have been reviewed and there are no changes at this time.  3. Mixed hyperlipidemia Continue statin as ordered and reviewed, no changes  at this time  4. Tobacco use disorder Smoking cessation was discussed, 3-10 minutes spent on this topic specifically   Current Outpatient Medications on File Prior to Visit  Medication Sig Dispense Refill   albuterol  (VENTOLIN  HFA) 108 (90 Base) MCG/ACT inhaler Inhale 2 puffs into the lungs every 6 (six) hours as needed for wheezing or shortness of breath. 6.7 g 5   aspirin  EC 81 MG tablet Take 1 tablet (81 mg total) by mouth daily. Swallow whole. 30 tablet 12   betamethasone  valerate (VALISONE ) 0.1 % cream Apply topically 2 (two) times daily. To rash on buttocks (Patient taking differently: Apply 1 Application topically 2 (two) times daily as needed (rash).) 45 g 1   cilostazol  (PLETAL ) 50 MG tablet Take 1 tablet (50 mg total) by mouth 2 (two) times daily. 200 tablet 2   clopidogrel  (PLAVIX ) 75 MG tablet Take 1 tablet (75 mg total) by mouth daily.     isosorbide  mononitrate (IMDUR ) 120 MG 24 hr tablet Take 1 tablet (120 mg total) by mouth daily. 30 tablet 0   lisinopril  (PRINIVIL ,ZESTRIL ) 10 MG tablet Take 10 mg by  mouth daily.     metoprolol  tartrate (LOPRESSOR ) 50 MG tablet Take 50-75 mg by mouth See admin instructions. Take 50 mg by mouth every morning and 75 mg every evening     nitroGLYCERIN  (NITROSTAT ) 0.4 MG SL tablet Place 0.4 mg under the tongue every 5 (five) minutes as needed for chest pain.     simvastatin  (ZOCOR ) 40 MG tablet Take 1 tablet (40 mg total) by mouth at bedtime. 30 tablet 5   No current facility-administered medications on file prior to visit.    There are no Patient Instructions on file for this visit. No follow-ups on file.   Denya Buckingham E Ladawn Boullion, NP

## 2024-02-03 LAB — VAS US ABI WITH/WO TBI
Left ABI: 0.96
Right ABI: 1

## 2024-02-06 ENCOUNTER — Other Ambulatory Visit: Payer: Self-pay

## 2024-02-06 DIAGNOSIS — Z87891 Personal history of nicotine dependence: Secondary | ICD-10-CM

## 2024-02-06 DIAGNOSIS — Z122 Encounter for screening for malignant neoplasm of respiratory organs: Secondary | ICD-10-CM

## 2024-02-06 DIAGNOSIS — F17210 Nicotine dependence, cigarettes, uncomplicated: Secondary | ICD-10-CM

## 2024-02-13 DIAGNOSIS — R918 Other nonspecific abnormal finding of lung field: Secondary | ICD-10-CM | POA: Diagnosis not present

## 2024-02-13 DIAGNOSIS — F17200 Nicotine dependence, unspecified, uncomplicated: Secondary | ICD-10-CM | POA: Diagnosis not present

## 2024-02-13 DIAGNOSIS — F17210 Nicotine dependence, cigarettes, uncomplicated: Secondary | ICD-10-CM | POA: Diagnosis not present

## 2024-02-13 DIAGNOSIS — I2510 Atherosclerotic heart disease of native coronary artery without angina pectoris: Secondary | ICD-10-CM | POA: Diagnosis not present

## 2024-02-13 DIAGNOSIS — J449 Chronic obstructive pulmonary disease, unspecified: Secondary | ICD-10-CM | POA: Diagnosis not present

## 2024-02-14 ENCOUNTER — Encounter (INDEPENDENT_AMBULATORY_CARE_PROVIDER_SITE_OTHER): Payer: PPO

## 2024-02-14 ENCOUNTER — Ambulatory Visit (INDEPENDENT_AMBULATORY_CARE_PROVIDER_SITE_OTHER): Payer: PPO | Admitting: Vascular Surgery

## 2024-02-27 ENCOUNTER — Other Ambulatory Visit: Payer: Self-pay | Admitting: Urology

## 2024-02-27 DIAGNOSIS — N401 Enlarged prostate with lower urinary tract symptoms: Secondary | ICD-10-CM

## 2024-03-10 ENCOUNTER — Ambulatory Visit: Payer: Self-pay | Admitting: Urology

## 2024-03-10 DIAGNOSIS — N401 Enlarged prostate with lower urinary tract symptoms: Secondary | ICD-10-CM

## 2024-04-08 DIAGNOSIS — H353121 Nonexudative age-related macular degeneration, left eye, early dry stage: Secondary | ICD-10-CM | POA: Diagnosis not present

## 2024-04-13 ENCOUNTER — Encounter: Admitting: Internal Medicine

## 2024-04-13 DIAGNOSIS — H353121 Nonexudative age-related macular degeneration, left eye, early dry stage: Secondary | ICD-10-CM | POA: Diagnosis not present

## 2024-04-13 DIAGNOSIS — H40003 Preglaucoma, unspecified, bilateral: Secondary | ICD-10-CM | POA: Diagnosis not present

## 2024-04-13 DIAGNOSIS — H353111 Nonexudative age-related macular degeneration, right eye, early dry stage: Secondary | ICD-10-CM | POA: Diagnosis not present

## 2024-04-13 DIAGNOSIS — Z961 Presence of intraocular lens: Secondary | ICD-10-CM | POA: Diagnosis not present

## 2024-04-20 ENCOUNTER — Ambulatory Visit (INDEPENDENT_AMBULATORY_CARE_PROVIDER_SITE_OTHER): Admitting: Internal Medicine

## 2024-04-20 ENCOUNTER — Encounter: Payer: Self-pay | Admitting: Internal Medicine

## 2024-04-20 VITALS — BP 124/78 | HR 75 | Ht 73.0 in | Wt 192.0 lb

## 2024-04-20 DIAGNOSIS — Z23 Encounter for immunization: Secondary | ICD-10-CM

## 2024-04-20 DIAGNOSIS — Z0000 Encounter for general adult medical examination without abnormal findings: Secondary | ICD-10-CM

## 2024-04-20 DIAGNOSIS — E782 Mixed hyperlipidemia: Secondary | ICD-10-CM | POA: Diagnosis not present

## 2024-04-20 DIAGNOSIS — I739 Peripheral vascular disease, unspecified: Secondary | ICD-10-CM

## 2024-04-20 DIAGNOSIS — N401 Enlarged prostate with lower urinary tract symptoms: Secondary | ICD-10-CM

## 2024-04-20 DIAGNOSIS — R7303 Prediabetes: Secondary | ICD-10-CM

## 2024-04-20 DIAGNOSIS — I10 Essential (primary) hypertension: Secondary | ICD-10-CM | POA: Diagnosis not present

## 2024-04-20 DIAGNOSIS — J449 Chronic obstructive pulmonary disease, unspecified: Secondary | ICD-10-CM | POA: Diagnosis not present

## 2024-04-20 NOTE — Assessment & Plan Note (Signed)
 Well controlled blood pressure today. Current regimen is metoprolol  and lisinopril . No medication side effects noted.

## 2024-04-20 NOTE — Assessment & Plan Note (Signed)
 On Flomax  nightly with good results. Medications prescribed by Urology. Will get PSA today.

## 2024-04-20 NOTE — Assessment & Plan Note (Signed)
 Doing well currently.  Walking the dog every day for an hour or more. No leg pain or chest pain. Continue antiplatelet regimen.

## 2024-04-20 NOTE — Assessment & Plan Note (Signed)
 Symptoms stable on Albuterol  PRN

## 2024-04-20 NOTE — Assessment & Plan Note (Signed)
 LDL is  Lab Results  Component Value Date   LDLCALC 65 04/10/2023    Current medication regimen is simvastatin . Goal LDL is < 55.

## 2024-04-20 NOTE — Progress Notes (Signed)
 Date:  04/20/2024   Name:  Aaron [name]   DOB:  1956/02/24   MRN:  [id number]   Chief Complaint: Annual Exam Aaron [name] is a 68 y.o. male who presents today for his Complete Annual Exam. He feels well. He reports exercising - three times daily. He reports he is sleeping fairly well.   Health Maintenance  Topic Date Due   Zoster (Shingles) Vaccine (2 of 2) 06/21/2023   DTaP/Tdap/Td vaccine (2 - Td or Tdap) 12/19/2023   COVID-19 Vaccine (4 - 2025-26 season) 05/06/2024*   Medicare Annual Wellness Visit  10/01/2024   Colon Cancer Screening  01/17/2025   Pneumococcal Vaccine for age over 69  Completed   Flu Shot  Completed   Hepatitis C Screening  Completed   Meningitis B Vaccine  Aged Out   Screening for Lung Cancer  Discontinued  *Topic was postponed. The date shown is not the original due date.   Lab Results  Component Value Date   PSA1 1.9 04/10/2023   PSA1 2.6 04/06/2022   PSA1 1.7 04/05/2021   PSA 0.98 05/19/2015    Hypertension This is a chronic problem. The problem is controlled. Pertinent negatives include no chest pain, headaches, palpitations or shortness of breath. Past treatments include ACE inhibitors and beta blockers. The current treatment provides significant improvement.  Hyperlipidemia This is a chronic problem. The problem is controlled. Pertinent negatives include no chest pain or shortness of breath. Current antihyperlipidemic treatment includes statins.  PVD - stable without current symptoms.   Review of Systems  Constitutional:  Negative for fatigue and unexpected weight change.  HENT:  Negative for nosebleeds.   Eyes:  Negative for visual disturbance.  Respiratory:  Negative for cough, chest tightness, shortness of breath and wheezing.   Cardiovascular:  Negative for chest pain, palpitations and leg swelling.  Gastrointestinal:  Negative for abdominal pain, constipation and diarrhea.  Genitourinary:  Negative for difficulty urinating,  hematuria and urgency.  Musculoskeletal:  Negative for arthralgias and gait problem.  Skin:  Negative for color change and rash.  Neurological:  Negative for dizziness, weakness, light-headedness and headaches.  Psychiatric/Behavioral:  Negative for dysphoric mood and sleep disturbance. The patient is not nervous/anxious.      Lab Results  Component Value Date   NA 132 (L) 01/01/2024   K 4.0 01/01/2024   CO2 21 (L) 01/01/2024   GLUCOSE 97 01/01/2024   BUN 19 01/01/2024   CREATININE 0.75 01/01/2024   CALCIUM 9.3 01/01/2024   EGFR 89 04/10/2023   GFRNONAA >60 01/01/2024   Lab Results  Component Value Date   CHOL 118 04/10/2023   HDL 31 (L) 04/10/2023   LDLCALC 65 04/10/2023   TRIG 124 04/10/2023   CHOLHDL 3.8 04/10/2023   Lab Results  Component Value Date   TSH 3.214 02/06/2019   Lab Results  Component Value Date   HGBA1C 5.5 04/10/2023   Lab Results  Component Value Date   WBC 10.2 01/17/2024   HGB 13.0 01/17/2024   HCT 37.9 (L) 01/17/2024   MCV 95.7 01/17/2024   PLT 306 01/17/2024   Lab Results  Component Value Date   ALT 33 04/10/2023   AST 26 04/10/2023   ALKPHOS 68 04/10/2023   BILITOT 0.3 04/10/2023   No results found for: MARIEN BOLLS, VD25OH   Patient Active Problem List   Diagnosis Date Noted   Left inguinal hernia 01/16/2024   BPH with lower urinary tract symptoms without urinary obstruction 04/10/2023  Atherosclerosis of artery of both lower extremities 04/11/2022   Hepatic steatosis 01/04/2022   Aortic atherosclerosis 07/10/2020   Stage 2 moderate COPD by GOLD classification (HCC) 05/16/2020   Dyspepsia and disorder of function of stomach 05/16/2020   Cervical disc disorder of mid-cervical region 05/10/2020   Polyp of descending colon    Mixed hyperlipidemia 10/28/2018   Atherosclerosis of native arteries of extremity with intermittent claudication 07/13/2016   Tobacco use disorder 07/13/2016   Prediabetes 07/27/2015    Controlled gout 12/22/2014   Coronary artery disease involving native coronary artery of native heart with angina pectoris 10/09/2014   Essential (primary) hypertension 10/09/2014   Palmar fascial fibromatosis (dupuytren) 10/09/2014   Psoriasis 10/09/2014   H/O cardiac catheterization 01/27/2014   Peripheral vascular disease (HCC) 01/27/2014    Allergies  Allergen Reactions   Contrast Media [Iodinated Contrast Media] Hives   Iodine  Hives    Past Surgical History:  Procedure Laterality Date   APPENDECTOMY     CATARACT EXTRACTION W/PHACO Right 10/26/2021   Procedure: CATARACT EXTRACTION PHACO AND INTRAOCULAR LENS PLACEMENT (IOC) RIGHT OMIDRIA   10.98 01:18.2;  Surgeon: Enola Feliciano Hugger, MD;  Location: [facility];  Service: Ophthalmology;  Laterality: Right;   CATARACT EXTRACTION W/PHACO Left 11/16/2021   Procedure: CATARACT EXTRACTION PHACO AND INTRAOCULAR LENS PLACEMENT (IOC) LEFT;  Surgeon: Enola Feliciano Hugger, MD;  Location: [facility];  Service: Ophthalmology;  Laterality: Left;  6.91 0:46.0   COLONOSCOPY  03/30/2013   [place]   COLONOSCOPY WITH PROPOFOL  N/A 12/10/2019   Procedure: COLONOSCOPY WITH PROPOFOL ;  Surgeon: Jinny Carmine, MD;  Location: [facility];  Service: Endoscopy;  Laterality: N/A;   COLONOSCOPY WITH PROPOFOL  N/A 01/18/2020   Procedure: COLONOSCOPY WITH PROPOFOL ;  Surgeon: Jinny Carmine, MD;  Location: [facility];  Service: Endoscopy;  Laterality: N/A;  4   CORONARY ANGIOPLASTY WITH STENT PLACEMENT Left    Procedure: CORONARY ANGIOPLASTY WITH STENT PLACEMENT   ENDARTERECTOMY FEMORAL Left 04/11/2022   Procedure: ENDARTERECTOMY FEMORAL ( SFA & TIBIAL); insertion of SFA stent;  Surgeon: Marea Selinda RAMAN, MD;  Location: [facility];  Service: Vascular;  Laterality: Left;   HERNIORRHAPHY, INGUINAL, ROBOT-ASSISTED, LAPAROSCOPIC Left 01/08/2024   Procedure: HERNIORRHAPHY, INGUINAL, ROBOT-ASSISTED, LAPAROSCOPIC;  Surgeon: Marinda Jayson KIDD,  MD;  Location: [facility];  Service: General;  Laterality: Left;  with mesh   INSERTION OF MESH  01/08/2024   Procedure: INSERTION OF MESH;  Surgeon: Marinda Jayson KIDD, MD;  Location: [facility];  Service: General;;   LAPAROSCOPIC LYSIS OF ADHESIONS  01/08/2024   Procedure: LYSIS, ADHESIONS, LAPAROSCOPIC;  Surgeon: Marinda Jayson KIDD, MD;  Location: [facility];  Service: General;;   LEFT HEART CATH AND CORONARY ANGIOGRAPHY N/A 12/29/2020   Procedure: LEFT HEART CATH AND CORONARY ANGIOGRAPHY;  Surgeon: Hester Wolm PARAS, MD;  Location: [facility];  Service: Cardiovascular;  Laterality: N/A;   LEFT HEART CATH AND CORONARY ANGIOGRAPHY Left 06/19/2010   Procedure: LEFT HEART CATH AND CORONARY ANGIOGRAPHY; Location: [facility]; Surgeon: Vita Bathe, MD   LEFT HEART CATH AND CORONARY ANGIOGRAPHY Left 07/08/2014   Procedure: LEFT HEART CATH AND CORONARY ANGIOGRAPHY; Location: [facility]; Surgeon: Marsa Dooms, MD   LOWER EXTREMITY ANGIOGRAM Left 04/11/2022   Procedure: LOWER EXTREMITY ANGIOGRAM;  Surgeon: Marea Selinda RAMAN, MD;  Location: [facility];  Service: Vascular;  Laterality: Left;   LOWER EXTREMITY ANGIOGRAPHY Right 09/25/2018   Procedure: LOWER EXTREMITY ANGIOGRAPHY;  Surgeon: Marea Selinda RAMAN, MD;  Location: [facility];  Service: Cardiovascular;  Laterality: Right;   LOWER EXTREMITY ANGIOGRAPHY Left 03/17/2020   Procedure: LOWER EXTREMITY ANGIOGRAPHY;  Surgeon: Marea Selinda RAMAN, MD;  Location: [facility];  Service: Cardiovascular;  Laterality: Left;   LOWER EXTREMITY ANGIOGRAPHY Right 02/22/2022   Procedure: Lower Extremity Angiography;  Surgeon: Marea Selinda RAMAN, MD;  Location: [facility];  Service: Cardiovascular;  Laterality: Right;   POLYPECTOMY N/A 01/18/2020   Procedure: POLYPECTOMY;  Surgeon: Jinny Carmine, MD;  Location: [facility];  Service: Endoscopy;  Laterality: N/A;    Social History   Tobacco Use   Smoking status: Every Day    Types: Cigars    Passive exposure:  Past   Smokeless tobacco: Never  Vaping Use   Vaping status: Never Used  Substance Use Topics   Alcohol use: Yes    Comment: quit drinking around 04/2019(May have drink 1x/mo)   Drug use: No     Medication list has been reviewed and updated.  Current Meds  Medication Sig   albuterol  (VENTOLIN  HFA) 108 (90 Base) MCG/ACT inhaler Inhale 2 puffs into the lungs every 6 (six) hours as needed for wheezing or shortness of breath.   aspirin  EC 81 MG tablet Take 1 tablet (81 mg total) by mouth daily. Swallow whole.   betamethasone  valerate (VALISONE ) 0.1 % cream Apply topically 2 (two) times daily. To rash on buttocks (Patient taking differently: Apply 1 Application topically 2 (two) times daily as needed (rash).)   cilostazol  (PLETAL ) 50 MG tablet Take 1 tablet (50 mg total) by mouth 2 (two) times daily.   clopidogrel  (PLAVIX ) 75 MG tablet Take 1 tablet (75 mg total) by mouth daily.   isosorbide  mononitrate (IMDUR ) 120 MG 24 hr tablet Take 1 tablet (120 mg total) by mouth daily.   lisinopril  (PRINIVIL ,ZESTRIL ) 10 MG tablet Take 10 mg by mouth daily.   metoprolol  tartrate (LOPRESSOR ) 50 MG tablet Take 50-75 mg by mouth See admin instructions. Take 50 mg by mouth every morning and 75 mg every evening   nitroGLYCERIN  (NITROSTAT ) 0.4 MG SL tablet Place 0.4 mg under the tongue every 5 (five) minutes as needed for chest pain.   simvastatin  (ZOCOR ) 40 MG tablet Take 1 tablet (40 mg total) by mouth at bedtime.   tamsulosin  (FLOMAX ) 0.4 MG CAPS capsule TAKE 1 CAPSULE BY MOUTH AT  BEDTIME       04/20/2024    9:56 AM 01/17/2024    1:58 PM 12/04/2023    1:42 PM 09/18/2023   10:04 AM  GAD 7 : Generalized Anxiety Score  Nervous, Anxious, on Edge 0 0 0 0  Control/stop worrying 0 0 0 0  Worry too much - different things 0 0 0 0  Trouble relaxing 0 0 0 0  Restless 0 0 0 0  Easily annoyed or irritable 0 0 0 0  Afraid - awful might happen 0 0 0 0  Total GAD 7 Score 0 0 0 0  Anxiety Difficulty Not  difficult at all Not difficult at all Not difficult at all Not difficult at all       04/20/2024    9:56 AM 01/17/2024    1:57 PM 12/04/2023    1:42 PM  Depression screen PHQ 2/9  Decreased Interest 0 0 0  Down, Depressed, Hopeless 0 0 0  PHQ - 2 Score 0 0 0  Altered sleeping 0 0 0  Tired, decreased energy 0 0 0  Change in appetite 0 0 0  Feeling bad or failure  about yourself  0 0 0  Trouble concentrating 0 0 0  Moving slowly or fidgety/restless 0 0 0  Suicidal thoughts 0 0 0  PHQ-9 Score 0 0 0  Difficult doing work/chores Not difficult at all Not difficult at all Not difficult at all    BP Readings from Last 3 Encounters:  04/20/24 124/78  01/30/24 136/76  01/21/24 100/63    Physical Exam Vitals and nursing note reviewed.  Constitutional:      Appearance: Normal appearance. He is well-developed.  HENT:     Head: Normocephalic.     Right Ear: Tympanic membrane, ear canal and external ear normal.     Left Ear: Tympanic membrane, ear canal and external ear normal.     Nose: Nose normal.  Eyes:     Conjunctiva/sclera: Conjunctivae normal.     Pupils: Pupils are equal, round, and reactive to light.  Neck:     Thyroid : No thyromegaly.     Vascular: No carotid bruit.  Cardiovascular:     Rate and Rhythm: Normal rate and regular rhythm.     Heart sounds: Normal heart sounds.  Pulmonary:     Effort: Pulmonary effort is normal.     Breath sounds: Normal breath sounds. No wheezing.  Chest:  Breasts:    Right: No mass.     Left: No mass.  Abdominal:     General: Bowel sounds are normal.     Palpations: Abdomen is soft.     Tenderness: There is no abdominal tenderness.  Musculoskeletal:        General: Normal range of motion.     Cervical back: Normal range of motion and neck supple.  Lymphadenopathy:     Cervical: No cervical adenopathy.  Skin:    General: Skin is warm and dry.     Capillary Refill: Capillary refill takes less than 2 seconds.  Neurological:      General: No focal deficit present.     Mental Status: He is alert and oriented to person, place, and time.     Deep Tendon Reflexes: Reflexes are normal and symmetric.  Psychiatric:        Attention and Perception: Attention normal.        Mood and Affect: Mood normal.        Thought Content: Thought content normal.     Wt Readings from Last 3 Encounters:  04/20/24 192 lb (87.1 kg)  01/30/24 187 lb 3.2 oz (84.9 kg)  01/21/24 186 lb (84.4 kg)    BP 124/78   Pulse 75   Ht 6' 1 (1.854 m)   Wt 192 lb (87.1 kg)   SpO2 95%   BMI 25.33 kg/m   Assessment and Plan:  Problem List Items Addressed This Visit       Unprioritized   Essential (primary) hypertension (Chronic)   Well controlled blood pressure today. Current regimen is metoprolol  and lisinopril . No medication side effects noted.        Relevant Orders   CBC with Differential/Platelet   Comprehensive metabolic panel with GFR   Peripheral vascular disease (HCC) (Chronic)   Doing well currently.  Walking the dog every day for an hour or more. No leg pain or chest pain. Continue antiplatelet regimen.      Prediabetes (Chronic)   Relevant Orders   Hemoglobin A1c   Mixed hyperlipidemia (Chronic)   LDL is  Lab Results  Component Value Date   LDLCALC 65 04/10/2023    Current medication  regimen is simvastatin . Goal LDL is < 55.       Relevant Orders   Lipid panel   Stage 2 moderate COPD by GOLD classification (HCC) (Chronic)   Symptoms stable on Albuterol  PRN      BPH with lower urinary tract symptoms without urinary obstruction   On Flomax  nightly with good results. Medications prescribed by Urology. Will get PSA today.      Relevant Orders   PSA   Other Visit Diagnoses       Annual physical exam    -  Primary   Stable exam.  screenings up to date.  flu vaccine today. he can get Covid-19 vaccine at the pharmacy     Encounter for immunization       Relevant Orders   Flu vaccine HIGH DOSE  PF(Fluzone Trivalent) (Completed)       Return in about 6 months (around 10/19/2024) for TOC  HTN, lipids Dr. Sol.    Leita HILARIO Adie, MD [facility] [place]

## 2024-04-21 ENCOUNTER — Other Ambulatory Visit: Payer: Self-pay | Admitting: Urology

## 2024-04-21 ENCOUNTER — Ambulatory Visit: Payer: Self-pay | Admitting: Internal Medicine

## 2024-04-21 DIAGNOSIS — N401 Enlarged prostate with lower urinary tract symptoms: Secondary | ICD-10-CM

## 2024-04-21 LAB — CBC WITH DIFFERENTIAL/PLATELET
Basophils Absolute: 0.1 x10E3/uL (ref 0.0–0.2)
Basos: 1 %
EOS (ABSOLUTE): 0.1 x10E3/uL (ref 0.0–0.4)
Eos: 1 %
Hematocrit: 56.3 % — ABNORMAL HIGH (ref 37.5–51.0)
Hemoglobin: 18.8 g/dL — ABNORMAL HIGH (ref 13.0–17.7)
Immature Grans (Abs): 0 x10E3/uL (ref 0.0–0.1)
Immature Granulocytes: 0 %
Lymphocytes Absolute: 1.5 x10E3/uL (ref 0.7–3.1)
Lymphs: 18 %
MCH: 32.2 pg (ref 26.6–33.0)
MCHC: 33.4 g/dL (ref 31.5–35.7)
MCV: 97 fL (ref 79–97)
Monocytes Absolute: 0.7 x10E3/uL (ref 0.1–0.9)
Monocytes: 8 %
Neutrophils Absolute: 6.1 x10E3/uL (ref 1.4–7.0)
Neutrophils: 72 %
Platelets: 274 x10E3/uL (ref 150–450)
RBC: 5.83 x10E6/uL — ABNORMAL HIGH (ref 4.14–5.80)
RDW: 13 % (ref 11.6–15.4)
WBC: 8.5 x10E3/uL (ref 3.4–10.8)

## 2024-04-21 LAB — COMPREHENSIVE METABOLIC PANEL WITH GFR
ALT: 29 IU/L (ref 0–44)
AST: 23 IU/L (ref 0–40)
Albumin: 4.8 g/dL (ref 3.9–4.9)
Alkaline Phosphatase: 103 IU/L (ref 47–123)
BUN/Creatinine Ratio: 13 (ref 10–24)
BUN: 13 mg/dL (ref 8–27)
Bilirubin Total: 0.5 mg/dL (ref 0.0–1.2)
CO2: 19 mmol/L — ABNORMAL LOW (ref 20–29)
Calcium: 9.4 mg/dL (ref 8.6–10.2)
Chloride: 98 mmol/L (ref 96–106)
Creatinine, Ser: 0.97 mg/dL (ref 0.76–1.27)
Globulin, Total: 2.6 g/dL (ref 1.5–4.5)
Glucose: 100 mg/dL — ABNORMAL HIGH (ref 70–99)
Potassium: 4.9 mmol/L (ref 3.5–5.2)
Sodium: 132 mmol/L — ABNORMAL LOW (ref 134–144)
Total Protein: 7.4 g/dL (ref 6.0–8.5)
eGFR: 85 mL/min/1.73 (ref >59)

## 2024-04-21 LAB — LIPID PANEL
Chol/HDL Ratio: 4.7 ratio (ref 0.0–5.0)
Cholesterol, Total: 186 mg/dL (ref 100–199)
HDL: 40 mg/dL (ref >39)
LDL Chol Calc (NIH): 107 mg/dL — ABNORMAL HIGH (ref 0–99)
Triglycerides: 228 mg/dL — ABNORMAL HIGH (ref 0–149)
VLDL Cholesterol Cal: 39 mg/dL (ref 5–40)

## 2024-04-21 LAB — PSA: Prostate Specific Ag, Serum: 2.3 ng/mL (ref 0.0–4.0)

## 2024-04-21 LAB — HEMOGLOBIN A1C
Est. average glucose Bld gHb Est-mCnc: 120 mg/dL
Hgb A1c MFr Bld: 5.8 % — ABNORMAL HIGH (ref 4.8–5.6)

## 2024-04-22 MED ORDER — ROSUVASTATIN CALCIUM 10 MG PO TABS: 10.0000 mg | ORAL_TABLET | Freq: Every day | ORAL | 1 refills | Status: AC

## 2024-07-13 ENCOUNTER — Ambulatory Visit: Payer: Self-pay

## 2024-07-13 NOTE — Telephone Encounter (Signed)
 FYI Only or Action Required?: FYI only for provider: appointment scheduled on 1/6.  Patient was last seen in primary care on 04/20/2024 by Aaron Leita DEL, MD.  Called Nurse Triage reporting Cough.  Symptoms began several days ago.  Interventions attempted: Rest, hydration, or home remedies.  Symptoms are: gradually worsening.  Triage Disposition: See Physician Within 24 Hours  Patient/caregiver understands and will follow disposition?: Yes, will follow disposition  Copied from CRM [id number]. Topic: Clinical - Red Word Triage >> Jul 13, 2024  8:40 AM Larissa RAMAN wrote: [facility] that prompted transfer to Nurse Triage: cough with mucous, back pain Reason for Disposition  SEVERE coughing spells (e.g., whooping sound after coughing, vomiting after coughing)  Answer Assessment - Initial Assessment Questions 1. ONSET: When did the cough begin?      About a week ago 2. SEVERITY: How bad is the cough today?      severe 3. SPUTUM: Describe the color of your sputum (e.g., none, dry cough; clear, white, yellow, green)     yellow 4. HEMOPTYSIS: Are you coughing up any blood? If Yes, ask: How much? (e.g., flecks, streaks, tablespoons, etc.)     denies 5. DIFFICULTY BREATHING: Are you having difficulty breathing? If Yes, ask: How bad is it? (e.g., mild, moderate, severe)      denies 6. FEVER: Do you have a fever? If Yes, ask: What is your temperature, how was it measured, and when did it start?     denies 10. OTHER SYMPTOMS: Do you have any other symptoms? (e.g., runny nose, wheezing, chest pain)       Back pain, especially when supine and coughing  Offered appt today, declined d/t transportation, requested appt tomorrow.  Protocols used: Cough - Acute Productive-A-AH

## 2024-07-14 ENCOUNTER — Encounter: Payer: Self-pay | Admitting: Physician Assistant

## 2024-07-14 ENCOUNTER — Ambulatory Visit (INDEPENDENT_AMBULATORY_CARE_PROVIDER_SITE_OTHER): Admitting: Physician Assistant

## 2024-07-14 VITALS — BP 130/78 | HR 66 | Temp 98.4°F | Ht 73.0 in | Wt 189.0 lb

## 2024-07-14 DIAGNOSIS — J069 Acute upper respiratory infection, unspecified: Secondary | ICD-10-CM

## 2024-07-14 MED ORDER — GUAIFENESIN-CODEINE 100-10 MG/5ML PO SOLN: 5.0000 mL | Freq: Three times a day (TID) | ORAL | 0 refills | Status: DC | PRN

## 2024-07-14 NOTE — Progress Notes (Signed)
 "   Date:  07/14/2024   Name:  Aaron [name]   DOB:  [birth year]   MRN:  [id number]   Chief Complaint: Cough (X6d, causing lower back pain, productive of yellow mucus)  Cough This is a new problem. Episode onset: X1 week. The problem has been gradually improving. The problem occurs hourly. The cough is Productive of sputum (yellow). He has tried OTC cough suppressant for the symptoms. The treatment provided mild relief.    Aaron [name] is a 69 y.o. male with COPD new to me today for acute visit to evaluate 7d cough productive of phlegm. No other URI symptoms. No fever or SOB. Symptoms stable x48h. Had exposure to unspecified viral illness from family member right around Christmas, then began with symptoms about 4 days later.   Medication list has been reviewed and updated.  Active Medications[1]   Review of Systems  Respiratory:  Positive for cough.     Patient Active Problem List   Diagnosis Date Noted   Left inguinal hernia 01/16/2024   BPH with lower urinary tract symptoms without urinary obstruction 04/10/2023   Atherosclerosis of artery of both lower extremities 04/11/2022   Hepatic steatosis 01/04/2022   Aortic atherosclerosis 07/10/2020   Stage 2 moderate COPD by GOLD classification (HCC) 05/16/2020   Dyspepsia and disorder of function of stomach 05/16/2020   Cervical disc disorder of mid-cervical region 05/10/2020   Polyp of descending colon    Mixed hyperlipidemia 10/28/2018   Atherosclerosis of native arteries of extremity with intermittent claudication 07/13/2016   Tobacco use disorder 07/13/2016   Prediabetes 07/27/2015   Controlled gout 12/22/2014   Coronary artery disease involving native coronary artery of native heart with angina pectoris 10/09/2014   Essential (primary) hypertension 10/09/2014   Palmar fascial fibromatosis (dupuytren) 10/09/2014   Psoriasis 10/09/2014   H/O cardiac catheterization 01/27/2014   Peripheral vascular disease (HCC) 01/27/2014     Allergies[2]  Immunization History  Administered Date(s) Administered    sv, Bivalent, Protein Subunit Rsvpref,pf (Abrysvo) 04/26/2023   Fluad Quad(high Dose 65+) 04/05/2021, 03/21/2022   Fluad Trivalent(High Dose 65+) 04/10/2023   INFLUENZA, HIGH DOSE SEASONAL PF 04/20/2024   Influenza,inj,Quad PF,6+ Mos 05/19/2015, 04/06/2016, 04/02/2017, 03/24/2018, 04/01/2019, 04/14/2020   PFIZER(Purple Top)SARS-COV-2 Vaccination 08/25/2019, 09/15/2019, 06/13/2020   PNEUMOCOCCAL CONJUGATE-20 04/05/2021   Pneumococcal Polysaccharide-23 11/14/2012, 07/03/2014   Tdap 12/18/2013   Zoster Recombinant(Shingrix ) 04/26/2023    Past Surgical History:  Procedure Laterality Date   APPENDECTOMY     CATARACT EXTRACTION W/PHACO Right 10/26/2021   Procedure: CATARACT EXTRACTION PHACO AND INTRAOCULAR LENS PLACEMENT (IOC) RIGHT OMIDRIA   10.98 01:18.2;  Surgeon: Enola Feliciano Hugger, MD;  Location: [facility];  Service: Ophthalmology;  Laterality: Right;   CATARACT EXTRACTION W/PHACO Left 11/16/2021   Procedure: CATARACT EXTRACTION PHACO AND INTRAOCULAR LENS PLACEMENT (IOC) LEFT;  Surgeon: Enola Feliciano Hugger, MD;  Location: [facility];  Service: Ophthalmology;  Laterality: Left;  6.91 0:46.0   COLONOSCOPY  03/30/2013   [place]   COLONOSCOPY WITH PROPOFOL  N/A 12/10/2019   Procedure: COLONOSCOPY WITH PROPOFOL ;  Surgeon: Jinny Carmine, MD;  Location: [facility];  Service: Endoscopy;  Laterality: N/A;   COLONOSCOPY WITH PROPOFOL  N/A 01/18/2020   Procedure: COLONOSCOPY WITH PROPOFOL ;  Surgeon: Jinny Carmine, MD;  Location: [facility];  Service: Endoscopy;  Laterality: N/A;  4   CORONARY ANGIOPLASTY WITH STENT PLACEMENT Left    Procedure: CORONARY ANGIOPLASTY WITH STENT PLACEMENT   ENDARTERECTOMY FEMORAL Left 04/11/2022   Procedure: ENDARTERECTOMY FEMORAL (  SFA & TIBIAL); insertion of SFA stent;  Surgeon: Marea Selinda RAMAN, MD;  Location: [facility];  Service: Vascular;   Laterality: Left;   HERNIORRHAPHY, INGUINAL, ROBOT-ASSISTED, LAPAROSCOPIC Left 01/08/2024   Procedure: HERNIORRHAPHY, INGUINAL, ROBOT-ASSISTED, LAPAROSCOPIC;  Surgeon: Marinda Jayson KIDD, MD;  Location: [facility];  Service: General;  Laterality: Left;  with mesh   INSERTION OF MESH  01/08/2024   Procedure: INSERTION OF MESH;  Surgeon: Marinda Jayson KIDD, MD;  Location: [facility];  Service: General;;   LAPAROSCOPIC LYSIS OF ADHESIONS  01/08/2024   Procedure: LYSIS, ADHESIONS, LAPAROSCOPIC;  Surgeon: Marinda Jayson KIDD, MD;  Location: [facility];  Service: General;;   LEFT HEART CATH AND CORONARY ANGIOGRAPHY N/A 12/29/2020   Procedure: LEFT HEART CATH AND CORONARY ANGIOGRAPHY;  Surgeon: Hester Wolm PARAS, MD;  Location: [facility];  Service: Cardiovascular;  Laterality: N/A;   LEFT HEART CATH AND CORONARY ANGIOGRAPHY Left 06/19/2010   Procedure: LEFT HEART CATH AND CORONARY ANGIOGRAPHY; Location: [facility]; Surgeon: Vita Bathe, MD   LEFT HEART CATH AND CORONARY ANGIOGRAPHY Left 07/08/2014   Procedure: LEFT HEART CATH AND CORONARY ANGIOGRAPHY; Location: [facility]; Surgeon: Marsa Dooms, MD   LOWER EXTREMITY ANGIOGRAM Left 04/11/2022   Procedure: LOWER EXTREMITY ANGIOGRAM;  Surgeon: Marea Selinda RAMAN, MD;  Location: [facility];  Service: Vascular;  Laterality: Left;   LOWER EXTREMITY ANGIOGRAPHY Right 09/25/2018   Procedure: LOWER EXTREMITY ANGIOGRAPHY;  Surgeon: Marea Selinda RAMAN, MD;  Location: [facility];  Service: Cardiovascular;  Laterality: Right;   LOWER EXTREMITY ANGIOGRAPHY Left 03/17/2020   Procedure: LOWER EXTREMITY ANGIOGRAPHY;  Surgeon: Marea Selinda RAMAN, MD;  Location: [facility];  Service: Cardiovascular;  Laterality: Left;   LOWER EXTREMITY ANGIOGRAPHY Right 02/22/2022   Procedure: Lower Extremity Angiography;  Surgeon: Marea Selinda RAMAN, MD;  Location: [facility];  Service: Cardiovascular;  Laterality: Right;   POLYPECTOMY N/A 01/18/2020   Procedure: POLYPECTOMY;  Surgeon: Jinny Carmine, MD;  Location: [facility];  Service: Endoscopy;  Laterality: N/A;    Social History[3]  Family History  Problem Relation Age of Onset   Hypertension Mother    Diabetes Mother    Heart failure Father        age 2   Cancer Father    Ovarian cancer Sister    Heart disease Brother         07/14/2024   10:58 AM 04/20/2024    9:56 AM 01/17/2024    1:58 PM 12/04/2023    1:42 PM  GAD 7 : Generalized Anxiety Score  Nervous, Anxious, on Edge 0 0 0 0  Control/stop worrying 0 0 0 0  Worry too much - different things 0 0 0 0  Trouble relaxing 0 0 0 0  Restless 0 0 0 0  Easily annoyed or irritable 0 0 0 0  Afraid - awful might happen 0 0 0 0  Total GAD 7 Score 0 0 0 0  Anxiety Difficulty Not difficult at all Not difficult at all Not difficult at all Not difficult at all       07/14/2024   10:58 AM 04/20/2024    9:56 AM 01/17/2024    1:57 PM  Depression screen PHQ 2/9  Decreased Interest 0 0 0  Down, Depressed, Hopeless 0 0 0  PHQ - 2 Score 0 0 0  Altered sleeping  0 0  Tired, decreased energy  0 0  Change in appetite  0 0  Feeling bad or failure about  yourself   0 0  Trouble concentrating  0 0  Moving slowly or fidgety/restless  0 0  Suicidal thoughts  0 0  PHQ-9 Score  0  0   Difficult doing work/chores  Not difficult at all Not difficult at all     Data saved with a previous flowsheet row definition    BP Readings from Last 3 Encounters:  07/14/24 130/78  04/20/24 124/78  01/30/24 136/76    Wt Readings from Last 3 Encounters:  07/14/24 189 lb (85.7 kg)  04/20/24 192 lb (87.1 kg)  01/30/24 187 lb 3.2 oz (84.9 kg)    BP 130/78   Pulse 66   Temp 98.4 F (36.9 C)   Ht 6' 1 (1.854 m)   Wt 189 lb (85.7 kg)   SpO2 96%   BMI 24.94 kg/m   Physical Exam Vitals and nursing note reviewed.  Constitutional:      General: He is not in acute distress.    Appearance: Normal appearance.  HENT:     Right Ear: Tympanic membrane normal.     Left Ear:  Tympanic membrane normal.     Ears:     Comments: EAC clear bilaterally with good view of TM which is without effusion or erythema.     Nose: Nose normal.     Comments: Sinuses nontender    Mouth/Throat:     Mouth: Mucous membranes are moist.     Pharynx: No oropharyngeal exudate or posterior oropharyngeal erythema.  Eyes:     Conjunctiva/sclera: Conjunctivae normal.     Pupils: Pupils are equal, round, and reactive to light.  Cardiovascular:     Rate and Rhythm: Normal rate and regular rhythm.     Heart sounds: No murmur heard.    No friction rub. No gallop.  Pulmonary:     Effort: Pulmonary effort is normal.     Breath sounds: Examination of the right-middle field reveals rhonchi. Rhonchi present. No wheezing or rales.     Comments: Cough mild during visit. Rhonchi noted anteriorly but clear with cough. Lymphadenopathy:     Cervical: No cervical adenopathy.     Recent Labs     Component Value Date/Time   NA 132 (L) 04/20/2024 1030   NA 137 07/02/2014 0513   K 4.9 04/20/2024 1030   K 4.0 07/02/2014 0513   CL 98 04/20/2024 1030   CL 103 07/02/2014 0513   CO2 19 (L) 04/20/2024 1030   CO2 26 07/02/2014 0513   GLUCOSE 100 (H) 04/20/2024 1030   GLUCOSE 97 01/01/2024 1016   GLUCOSE 109 (H) 07/02/2014 0513   BUN 13 04/20/2024 1030   BUN 15 07/02/2014 0513   CREATININE 0.97 04/20/2024 1030   CREATININE 0.81 07/02/2014 0513   CALCIUM  9.4 04/20/2024 1030   CALCIUM  8.7 07/02/2014 0513   PROT 7.4 04/20/2024 1030   PROT 7.9 12/03/2011 2127   ALBUMIN 4.8 04/20/2024 1030   ALBUMIN 4.1 12/03/2011 2127   AST 23 04/20/2024 1030   AST 23 12/03/2011 2127   ALT 29 04/20/2024 1030   ALT 22 12/03/2011 2127   ALKPHOS 103 04/20/2024 1030   ALKPHOS 90 12/03/2011 2127   BILITOT 0.5 04/20/2024 1030   BILITOT 0.2 12/03/2011 2127   GFRNONAA >60 01/01/2024 1016   GFRNONAA >60 07/02/2014 0513   GFRNONAA >60 11/07/2012 0442   GFRAA >60 03/17/2020 0829   GFRAA >60 07/02/2014 0513   GFRAA  >60 11/07/2012 0442    Lab Results  Component  Value Date   WBC 8.5 04/20/2024   HGB 18.8 (H) 04/20/2024   HCT 56.3 (H) 04/20/2024   MCV 97 04/20/2024   PLT 274 04/20/2024   Lab Results  Component Value Date   HGBA1C 5.8 (H) 04/20/2024   HGBA1C 5.5 04/10/2023   HGBA1C 6.0 (H) 04/06/2022   Lab Results  Component Value Date   CHOL 186 04/20/2024   HDL 40 04/20/2024   LDLCALC 107 (H) 04/20/2024   TRIG 228 (H) 04/20/2024   CHOLHDL 4.7 04/20/2024   Lab Results  Component Value Date   TSH 3.214 02/06/2019      Assessment and Plan:  1. Acute URI (Primary) Oxygenating well, no wheezing, breathing comfortably. Cough is affecting sleep. Plan for rx cough syrup with codeine , has inhaler if he needs it. Do not expect he will need prednisone  or abx to recover, but if no better by Fri morning he is to call the office and I will send rx for either.   - guaiFENesin -codeine  100-10 MG/5ML syrup; Take 5 mLs by mouth 3 (three) times daily as needed.  Dispense: 120 mL; Refill: 0     Rolan Hoyle, PA-C, DMSc, DipACLM, Nutritionist [facility] [phone]      [1]  Current Meds  Medication Sig   albuterol  (VENTOLIN  HFA) 108 (90 Base) MCG/ACT inhaler Inhale 2 puffs into the lungs every 6 (six) hours as needed for wheezing or shortness of breath.   aspirin  EC 81 MG tablet Take 1 tablet (81 mg total) by mouth daily. Swallow whole.   betamethasone  valerate (VALISONE ) 0.1 % cream Apply topically 2 (two) times daily. To rash on buttocks (Patient taking differently: Apply 1 Application topically 2 (two) times daily as needed (rash).)   cilostazol  (PLETAL ) 50 MG tablet Take 1 tablet (50 mg total) by mouth 2 (two) times daily.   clopidogrel  (PLAVIX ) 75 MG tablet Take 1 tablet (75 mg total) by mouth daily.   guaiFENesin -codeine  100-10 MG/5ML syrup Take 5 mLs by mouth 3 (three) times daily as needed.   isosorbide   mononitrate (IMDUR ) 120 MG 24 hr tablet Take 1 tablet (120 mg total) by mouth daily.   lisinopril  (PRINIVIL ,ZESTRIL ) 10 MG tablet Take 10 mg by mouth daily.   metoprolol  tartrate (LOPRESSOR ) 50 MG tablet Take 50-75 mg by mouth See admin instructions. Take 50 mg by mouth every morning and 75 mg every evening   nitroGLYCERIN  (NITROSTAT ) 0.4 MG SL tablet Place 0.4 mg under the tongue every 5 (five) minutes as needed for chest pain.   rosuvastatin  (CRESTOR ) 10 MG tablet Take 1 tablet (10 mg total) by mouth daily.   [DISCONTINUED] tamsulosin  (FLOMAX ) 0.4 MG CAPS capsule TAKE 1 CAPSULE BY MOUTH AT  BEDTIME  [2]  Allergies Allergen Reactions   Contrast Media [Iodinated Contrast Media] Hives   Iodine  Hives  [3]  Social History Tobacco Use   Smoking status: Every Day    Types: Cigars    Passive exposure: Past   Smokeless tobacco: Never  Vaping Use   Vaping status: Never Used  Substance Use Topics   Alcohol use: Yes    Comment: quit drinking around 04/2019(May have drink 1x/mo)   Drug use: No   "

## 2024-07-17 ENCOUNTER — Other Ambulatory Visit: Payer: Self-pay | Admitting: Internal Medicine

## 2024-07-17 ENCOUNTER — Telehealth: Payer: Self-pay

## 2024-07-17 DIAGNOSIS — J069 Acute upper respiratory infection, unspecified: Secondary | ICD-10-CM

## 2024-07-17 MED ORDER — AZITHROMYCIN 250 MG PO TABS: ORAL_TABLET | ORAL | 0 refills | Status: AC

## 2024-07-17 MED ORDER — GUAIFENESIN-CODEINE 100-10 MG/5ML PO SOLN: 5.0000 mL | Freq: Three times a day (TID) | ORAL | 0 refills | Status: AC | PRN

## 2024-07-17 NOTE — Telephone Encounter (Signed)
 Copied from CRM [id number]. Topic: Clinical - Medication Question >> Jul 17, 2024 10:19 AM Tonda B wrote: Reason for CRM: patient is calling said that his provider told him to call back today for him to ask for a rx for cough medication please call pt back [phone]   PER OV 07/15/23: Acute URI (Primary) Oxygenating well, no wheezing, breathing comfortably. Cough is affecting sleep. Plan for rx cough syrup with codeine , has inhaler if he needs it. Do not expect he will need prednisone  or abx to recover, but if no better by Fri morning he is to call the office and I will send rx for either.

## 2024-07-17 NOTE — Telephone Encounter (Signed)
 Spoke with patient, sent azithromycin  and a refill for guaifenesin -codeine .

## 2024-07-20 ENCOUNTER — Other Ambulatory Visit: Payer: Self-pay | Admitting: Physician Assistant

## 2024-07-20 DIAGNOSIS — J069 Acute upper respiratory infection, unspecified: Secondary | ICD-10-CM

## 2024-07-20 NOTE — Telephone Encounter (Signed)
 Seen 1/6 by Manya- Attempted to reach to triage continued need for cough syrup.    LOV Note states: 1. Acute URI (Primary) Oxygenating well, no wheezing, breathing comfortably. Cough is affecting sleep. Plan for rx cough syrup with codeine , has inhaler if he needs it. Do not expect he will need prednisone  or abx to recover, but if no better by Fri morning he is to call the office and I will send rx for either.

## 2024-07-20 NOTE — Telephone Encounter (Signed)
 Copied from CRM #[id number]. Topic: Clinical - Medication Refill >> Jul 20, 2024  8:22 AM Delon HERO wrote: Medication: guaiFENesin -codeine  100-10 MG/5ML syrup [[id number]]  Patient is calling to request if the medication above can be sent to the pharmacy below please.   Has the patient contacted their pharmacy? Yes (Agent: If no, request that the patient contact the pharmacy for the refill. If patient does not wish to contact the pharmacy document the reason why and proceed with request.) (Agent: If yes, when and what did the pharmacy advise?)  This is the patient's preferred pharmacy:   [facility] - [place], [place] - [address] [[id number]]  Is this the correct pharmacy for this prescription? Yes If no, delete pharmacy and type the correct one.   Has the prescription been filled recently? Yes  Is the patient out of the medication? Yes  Has the patient been seen for an appointment in the last year OR does the patient have an upcoming appointment? Yes  Can we respond through MyChart? Yes  Agent: Please be advised that Rx refills may take up to 3 business days. We ask that you follow-up with your pharmacy.

## 2024-07-20 NOTE — Telephone Encounter (Signed)
 Please review comment above.  KP

## 2024-07-28 ENCOUNTER — Other Ambulatory Visit (INDEPENDENT_AMBULATORY_CARE_PROVIDER_SITE_OTHER): Payer: Self-pay | Admitting: Nurse Practitioner

## 2024-07-28 DIAGNOSIS — I739 Peripheral vascular disease, unspecified: Secondary | ICD-10-CM

## 2024-07-31 ENCOUNTER — Encounter (INDEPENDENT_AMBULATORY_CARE_PROVIDER_SITE_OTHER): Payer: Self-pay | Admitting: Nurse Practitioner

## 2024-07-31 ENCOUNTER — Ambulatory Visit (INDEPENDENT_AMBULATORY_CARE_PROVIDER_SITE_OTHER)

## 2024-07-31 ENCOUNTER — Ambulatory Visit (INDEPENDENT_AMBULATORY_CARE_PROVIDER_SITE_OTHER): Admitting: Nurse Practitioner

## 2024-07-31 VITALS — BP 118/69 | HR 54 | Resp 17 | Ht 73.0 in | Wt 190.0 lb

## 2024-07-31 DIAGNOSIS — F17200 Nicotine dependence, unspecified, uncomplicated: Secondary | ICD-10-CM | POA: Diagnosis not present

## 2024-07-31 DIAGNOSIS — I739 Peripheral vascular disease, unspecified: Secondary | ICD-10-CM

## 2024-07-31 DIAGNOSIS — E782 Mixed hyperlipidemia: Secondary | ICD-10-CM | POA: Diagnosis not present

## 2024-07-31 DIAGNOSIS — Z9889 Other specified postprocedural states: Secondary | ICD-10-CM | POA: Diagnosis not present

## 2024-07-31 DIAGNOSIS — I10 Essential (primary) hypertension: Secondary | ICD-10-CM | POA: Diagnosis not present

## 2024-08-03 ENCOUNTER — Encounter (INDEPENDENT_AMBULATORY_CARE_PROVIDER_SITE_OTHER): Payer: Self-pay | Admitting: Nurse Practitioner

## 2024-08-03 NOTE — Progress Notes (Signed)
 "  Subjective:    Patient ID: Aaron [name], male    DOB: 06-22-1956, 69 y.o.   MRN: [id number] Chief Complaint  Patient presents with   Follow-up     6 months + ABI.    HPI  Discussed the use of AI scribe software for clinical note transcription with the patient, who gave verbal consent to proceed.  History of Present Illness Aaron [name] is a 69 year old male with peripheral arterial disease who presents for a six month vascular surgery follow-up.  He ambulates well and maintains regular walking activity, including in cold weather, without significant limitations. He denies claudication or pain in the thighs or calves. He reports intermittent discomfort localized to the hips, particularly when walking up inclines, which he attributes to arthritis. He is able to walk to local destinations, including the grocery store, which requires ambulation up a small incline.  He underwent hernia surgery in July 2025, during which he temporarily discontinued his anticoagulation. He has since resumed all prescribed blood thinners for peripheral arterial disease and cardiac indications, and remains adherent to his medication regimen.  Recent vascular studies show perfusion to the lower extremities and improved flow to the toes compared to prior studies.    Results Diagnostic Ankle-brachial index bilateral (07/31/2024): Right 0.95, left 0.95 Toe-brachial index bilateral (07/31/2024): Right 0.79, left 0.77   Review of Systems  Cardiovascular:  Negative for leg swelling.  Skin:  Negative for wound.  All other systems reviewed and are negative.      Objective:   Physical Exam Vitals reviewed.  HENT:     Head: Normocephalic.  Cardiovascular:     Rate and Rhythm: Normal rate.     Pulses:          Dorsalis pedis pulses are detected w/ Doppler on the right side and detected w/ Doppler on the left side.       Posterior tibial pulses are detected w/ Doppler on the right side and detected  w/ Doppler on the left side.  Pulmonary:     Effort: Pulmonary effort is normal.  Skin:    General: Skin is warm and dry.  Neurological:     Mental Status: He is alert and oriented to person, place, and time.  Psychiatric:        Mood and Affect: Mood normal.        Behavior: Behavior normal.        Thought Content: Thought content normal.        Judgment: Judgment normal.     Physical Exam    BP 118/69   Pulse (!) 54   Resp 17   Ht 6' 1 (1.854 m)   Wt 190 lb (86.2 kg)   BMI 25.07 kg/m   Past Medical History:  Diagnosis Date   Aortic atherosclerosis    COPD (chronic obstructive pulmonary disease) (HCC)    Coronary artery disease 06/19/2010   a.) LHC/PCI (unknown date):--> stent to pRCA and pLAD (unknown type); b.) LHC 06/19/2010: 30 mLM, 60 mLAD, 60 pLCx, 50 pRI, 40 ISR pRCA, 50 dRCA, 50 RPDA, 50 RPLS --> med mgmt; c.) LHC 07/08/2014: 10 pLM, 30 dLAD, 75 oLCx, 30 mRCA, 40 RPDA, patent RCA stent --> med mgmt; d.) LHC 12/28/2021: 50 mRCA, 65 RPDA, 45 m-dRCA, 60 o-pRCA, 95 o-pLCx, 60 RI, 30 pLAD, 30 mLM --> med mgmt.   CVA (cerebral vascular accident) [facility])    a.) noted on brain MRI 09/17/2022 - > small remote infarct  in the left middle cerebellar peduncle --> date of initial event unknown   DDD (degenerative disc disease), cervical    Diastolic dysfunction 09/16/2017   Fracture of left ankle, closed, initial encounter 10/23/2017   GERD (gastroesophageal reflux disease)    Glaucoma    Gout    Hepatic steatosis    Hypercholesteremia    Hypertension    Left inguinal hernia    Long term current use of antithrombotics/antiplatelets    a.) cilostazol  + low dose ASA + clopidogrel    PAD (peripheral artery disease)    a.) s/p multiple PTA procedures to lower extremities   Status post bilateral cataract extraction    Syncope 09/16/2017   Unstable angina (HCC) 01/12/2017   Vertigo     Social History   Socioeconomic History   Marital status: Single    Spouse name: Not on  file   Number of children: 0   Years of education: Not on file   Highest education level: 12th grade  Occupational History   Occupation: Retired/Disabled  Tobacco Use   Smoking status: Every Day    Types: Cigars    Passive exposure: Past   Smokeless tobacco: Never  Vaping Use   Vaping status: Never Used  Substance and Sexual Activity   Alcohol use: Yes    Comment: quit drinking around 04/2019(May have drink 1x/mo)   Drug use: No   Sexual activity: Not Currently    Birth control/protection: None  Other Topics Concern   Not on file  Social History Narrative   Independent at baseline. Lives alone. Does not drive.    Social Drivers of Health   Tobacco Use: High Risk (08/03/2024)   Patient History    Smoking Tobacco Use: Every Day    Smokeless Tobacco Use: Never    Passive Exposure: Past  Financial Resource Strain: Low Risk (10/02/2023)   Overall Financial Resource Strain (CARDIA)    Difficulty of Paying Living Expenses: Not hard at all  Food Insecurity: No Food Insecurity (10/02/2023)   Hunger Vital Sign    Worried About Running Out of Food in the Last Year: Never true    Ran Out of Food in the Last Year: Never true  Transportation Needs: No Transportation Needs (10/02/2023)   PRAPARE - Administrator, Civil Service (Medical): No    Lack of Transportation (Non-Medical): No  Physical Activity: Sufficiently Active (10/02/2023)   Exercise Vital Sign    Days of Exercise per Week: 7 days    Minutes of Exercise per Session: 30 min  Stress: No Stress Concern Present (10/02/2023)   [employer] of Occupational Health - Occupational Stress Questionnaire    Feeling of Stress : Not at all  Social Connections: Socially Isolated (10/02/2023)   Social Connection and Isolation Panel    Frequency of Communication with Friends and Family: More than three times a week    Frequency of Social Gatherings with Friends and Family: More than three times a week    Attends Religious  Services: Never    Database Administrator or Organizations: No    Attends Banker Meetings: Never    Marital Status: Never married  Intimate Partner Violence: Not At Risk (10/02/2023)   Humiliation, Afraid, Rape, and Kick questionnaire    Fear of Current or Ex-Partner: No    Emotionally Abused: No    Physically Abused: No    Sexually Abused: No  Depression (PHQ2-9): Low Risk (07/14/2024)   Depression (PHQ2-9)  PHQ-2 Score: 0  Alcohol Screen: Low Risk (10/02/2023)   Alcohol Screen    Last Alcohol Screening Score (AUDIT): 2  Housing: Unknown (02/13/2024)   Received from [facility]   Epic    Unable to Pay for Housing in the Last Year: Not on file    Number of Times Moved in the Last Year: Not on file    At any time in the past 12 months, were you homeless or living in a shelter (including now)?: No  Utilities: Not At Risk (10/02/2023)   [facility] Utilities    Threatened with loss of utilities: No  Health Literacy: Adequate Health Literacy (10/02/2023)   B1300 Health Literacy    Frequency of need for help with medical instructions: Never    Past Surgical History:  Procedure Laterality Date   APPENDECTOMY     CATARACT EXTRACTION W/PHACO Right 10/26/2021   Procedure: CATARACT EXTRACTION PHACO AND INTRAOCULAR LENS PLACEMENT (IOC) RIGHT OMIDRIA   10.98 01:18.2;  Surgeon: Enola Feliciano Hugger, MD;  Location: [facility];  Service: Ophthalmology;  Laterality: Right;   CATARACT EXTRACTION W/PHACO Left 11/16/2021   Procedure: CATARACT EXTRACTION PHACO AND INTRAOCULAR LENS PLACEMENT (IOC) LEFT;  Surgeon: Enola Feliciano Hugger, MD;  Location: [facility];  Service: Ophthalmology;  Laterality: Left;  6.91 0:46.0   COLONOSCOPY  03/30/2013   [place]   COLONOSCOPY WITH PROPOFOL  N/A 12/10/2019   Procedure: COLONOSCOPY WITH PROPOFOL ;  Surgeon: Jinny Carmine, MD;  Location: [facility];  Service: Endoscopy;  Laterality: N/A;   COLONOSCOPY WITH  PROPOFOL  N/A 01/18/2020   Procedure: COLONOSCOPY WITH PROPOFOL ;  Surgeon: Jinny Carmine, MD;  Location: [facility];  Service: Endoscopy;  Laterality: N/A;  4   CORONARY ANGIOPLASTY WITH STENT PLACEMENT Left    Procedure: CORONARY ANGIOPLASTY WITH STENT PLACEMENT   ENDARTERECTOMY FEMORAL Left 04/11/2022   Procedure: ENDARTERECTOMY FEMORAL ( SFA & TIBIAL); insertion of SFA stent;  Surgeon: Marea Selinda RAMAN, MD;  Location: [facility];  Service: Vascular;  Laterality: Left;   HERNIORRHAPHY, INGUINAL, ROBOT-ASSISTED, LAPAROSCOPIC Left 01/08/2024   Procedure: HERNIORRHAPHY, INGUINAL, ROBOT-ASSISTED, LAPAROSCOPIC;  Surgeon: Marinda Jayson KIDD, MD;  Location: [facility];  Service: General;  Laterality: Left;  with mesh   INSERTION OF MESH  01/08/2024   Procedure: INSERTION OF MESH;  Surgeon: Marinda Jayson KIDD, MD;  Location: [facility];  Service: General;;   LAPAROSCOPIC LYSIS OF ADHESIONS  01/08/2024   Procedure: LYSIS, ADHESIONS, LAPAROSCOPIC;  Surgeon: Marinda Jayson KIDD, MD;  Location: [facility];  Service: General;;   LEFT HEART CATH AND CORONARY ANGIOGRAPHY N/A 12/29/2020   Procedure: LEFT HEART CATH AND CORONARY ANGIOGRAPHY;  Surgeon: Hester Wolm PARAS, MD;  Location: [facility];  Service: Cardiovascular;  Laterality: N/A;   LEFT HEART CATH AND CORONARY ANGIOGRAPHY Left 06/19/2010   Procedure: LEFT HEART CATH AND CORONARY ANGIOGRAPHY; Location: [facility]; Surgeon: Vita Bathe, MD   LEFT HEART CATH AND CORONARY ANGIOGRAPHY Left 07/08/2014   Procedure: LEFT HEART CATH AND CORONARY ANGIOGRAPHY; Location: [facility]; Surgeon: Marsa Dooms, MD   LOWER EXTREMITY ANGIOGRAM Left 04/11/2022   Procedure: LOWER EXTREMITY ANGIOGRAM;  Surgeon: Marea Selinda RAMAN, MD;  Location: [facility];  Service: Vascular;  Laterality: Left;   LOWER EXTREMITY ANGIOGRAPHY Right 09/25/2018   Procedure: LOWER EXTREMITY ANGIOGRAPHY;  Surgeon: Marea Selinda RAMAN, MD;  Location: [facility];  Service: Cardiovascular;  Laterality: Right;    LOWER EXTREMITY ANGIOGRAPHY Left 03/17/2020   Procedure: LOWER EXTREMITY ANGIOGRAPHY;  Surgeon: Marea Selinda RAMAN, MD;  Location: [facility];  Service: Cardiovascular;  Laterality: Left;   LOWER EXTREMITY ANGIOGRAPHY Right 02/22/2022   Procedure: Lower Extremity Angiography;  Surgeon: Marea Selinda RAMAN, MD;  Location: [facility];  Service: Cardiovascular;  Laterality: Right;   POLYPECTOMY N/A 01/18/2020   Procedure: POLYPECTOMY;  Surgeon: Jinny Carmine, MD;  Location: [facility];  Service: Endoscopy;  Laterality: N/A;    Family History  Problem Relation Age of Onset   Hypertension Mother    Diabetes Mother    Heart failure Father        age 63   Cancer Father    Ovarian cancer Sister    Heart disease Brother     Allergies[1]     Latest Ref Rng & Units 04/20/2024   10:30 AM 01/17/2024    2:29 PM 01/01/2024   10:16 AM  CBC  WBC 3.4 - 10.8 x10E3/uL 8.5  10.2  7.1   Hemoglobin 13.0 - 17.7 g/dL 81.1  86.9  82.5   Hematocrit 37.5 - 51.0 % 56.3  37.9  49.1   Platelets 150 - 450 x10E3/uL 274  306  212       CMP     Component Value Date/Time   NA 132 (L) 04/20/2024 1030   NA 137 07/02/2014 0513   K 4.9 04/20/2024 1030   K 4.0 07/02/2014 0513   CL 98 04/20/2024 1030   CL 103 07/02/2014 0513   CO2 19 (L) 04/20/2024 1030   CO2 26 07/02/2014 0513   GLUCOSE 100 (H) 04/20/2024 1030   GLUCOSE 97 01/01/2024 1016   GLUCOSE 109 (H) 07/02/2014 0513   BUN 13 04/20/2024 1030   BUN 15 07/02/2014 0513   CREATININE 0.97 04/20/2024 1030   CREATININE 0.81 07/02/2014 0513   CALCIUM  9.4 04/20/2024 1030   CALCIUM  8.7 07/02/2014 0513   PROT 7.4 04/20/2024 1030   PROT 7.9 12/03/2011 2127   ALBUMIN 4.8 04/20/2024 1030   ALBUMIN 4.1 12/03/2011 2127   AST 23 04/20/2024 1030   AST 23 12/03/2011 2127   ALT 29 04/20/2024 1030   ALT 22 12/03/2011 2127   ALKPHOS 103 04/20/2024 1030   ALKPHOS 90 12/03/2011 2127   BILITOT 0.5 04/20/2024 1030   BILITOT 0.2 12/03/2011 2127    EGFR 85 04/20/2024 1030   GFRNONAA >60 01/01/2024 1016   GFRNONAA >60 07/02/2014 0513   GFRNONAA >60 11/07/2012 0442     No results found.     Assessment & Plan:   1. Peripheral arterial disease with history of revascularization (Primary) Peripheral arterial disease with history of revascularization Peripheral arterial disease is well-managed post-revascularization. Ankle-brachial indices are 0.95 bilaterally, toe pressures improved, and triphasic waveforms indicate adequate perfusion. No claudication or new symptoms. Compliant with antithrombotic therapy. - Reviewed recent vascular studies and discussed results with him. - Recommended continued regular ambulation and physical activity, including in cold weather as tolerated. - Reinforced monitoring for new or worsening symptoms such as increased difficulty ambulating, especially on inclines, or pain in thighs or calves. - Confirmed resumption and compliance with antithrombotic medications post-hernia surgery. - Scheduled follow-up in six months. - Advised to report any change or worsening of symptoms. - VAS US  ABI WITH/WO TBI; Future  2. Mixed hyperlipidemia Continue statin as ordered and reviewed, no changes at this time  3. Essential (primary) hypertension Continue antihypertensive medications as already ordered, these medications have been reviewed and there are no changes at this time.  4. Tobacco use disorder Smoking cessation  was discussed, 3-10 minutes spent on this topic specifically    Assessment and Plan Assessment & Plan      Medications Ordered Prior to Encounter[2]  There are no Patient Instructions on file for this visit. No follow-ups on file.   Massimo Hartland E Cherysh Epperly, NP      [1]  Allergies Allergen Reactions   Contrast Media [Iodinated Contrast Media] Hives   Iodine  Hives  [2]  Current Outpatient Medications on File Prior to Visit  Medication Sig Dispense Refill   albuterol  (VENTOLIN  HFA) 108 (90  Base) MCG/ACT inhaler Inhale 2 puffs into the lungs every 6 (six) hours as needed for wheezing or shortness of breath. 6.7 g 5   aspirin  EC 81 MG tablet Take 1 tablet (81 mg total) by mouth daily. Swallow whole. 30 tablet 12   betamethasone  valerate (VALISONE ) 0.1 % cream Apply topically 2 (two) times daily. To rash on buttocks (Patient taking differently: Apply 1 Application topically 2 (two) times daily as needed (rash).) 45 g 1   cilostazol  (PLETAL ) 50 MG tablet Take 1 tablet (50 mg total) by mouth 2 (two) times daily. 200 tablet 2   clopidogrel  (PLAVIX ) 75 MG tablet Take 1 tablet (75 mg total) by mouth daily.     guaiFENesin -codeine  100-10 MG/5ML syrup Take 5 mLs by mouth 3 (three) times daily as needed. 120 mL 0   isosorbide  mononitrate (IMDUR ) 120 MG 24 hr tablet Take 1 tablet (120 mg total) by mouth daily. 30 tablet 0   lisinopril  (PRINIVIL ,ZESTRIL ) 10 MG tablet Take 10 mg by mouth daily.     metoprolol  tartrate (LOPRESSOR ) 50 MG tablet Take 50-75 mg by mouth See admin instructions. Take 50 mg by mouth every morning and 75 mg every evening     nitroGLYCERIN  (NITROSTAT ) 0.4 MG SL tablet Place 0.4 mg under the tongue every 5 (five) minutes as needed for chest pain.     rosuvastatin  (CRESTOR ) 10 MG tablet Take 1 tablet (10 mg total) by mouth daily. 90 tablet 1   No current facility-administered medications on file prior to visit.   "

## 2024-08-04 LAB — VAS US ABI WITH/WO TBI
Left ABI: 0.95
Right ABI: 0.95

## 2024-10-07 ENCOUNTER — Encounter

## 2024-10-19 ENCOUNTER — Encounter: Admitting: Family Medicine

## 2025-01-28 ENCOUNTER — Ambulatory Visit (INDEPENDENT_AMBULATORY_CARE_PROVIDER_SITE_OTHER): Admitting: Nurse Practitioner

## 2025-01-28 ENCOUNTER — Encounter (INDEPENDENT_AMBULATORY_CARE_PROVIDER_SITE_OTHER)

## 2025-04-22 ENCOUNTER — Encounter: Admitting: Family Medicine
